# Patient Record
Sex: Male | Born: 1940
Health system: Southern US, Community
[De-identification: ages and names within clinical notes are randomized; demographics above are authoritative.]

## PROBLEM LIST (undated history)

## (undated) DIAGNOSIS — K469 Unspecified abdominal hernia without obstruction or gangrene: Secondary | ICD-10-CM

## (undated) DIAGNOSIS — G4733 Obstructive sleep apnea (adult) (pediatric): Secondary | ICD-10-CM

## (undated) DIAGNOSIS — R39198 Other difficulties with micturition: Secondary | ICD-10-CM

## (undated) DIAGNOSIS — K5792 Diverticulitis of intestine, part unspecified, without perforation or abscess without bleeding: Secondary | ICD-10-CM

## (undated) DIAGNOSIS — M7918 Myalgia, other site: Secondary | ICD-10-CM

## (undated) DIAGNOSIS — I639 Cerebral infarction, unspecified: Secondary | ICD-10-CM

## (undated) DIAGNOSIS — K219 Gastro-esophageal reflux disease without esophagitis: Secondary | ICD-10-CM

## (undated) DIAGNOSIS — E6609 Other obesity due to excess calories: Secondary | ICD-10-CM

## (undated) DIAGNOSIS — R079 Chest pain, unspecified: Secondary | ICD-10-CM

## (undated) DIAGNOSIS — I498 Other specified cardiac arrhythmias: Secondary | ICD-10-CM

## (undated) DIAGNOSIS — G4730 Sleep apnea, unspecified: Secondary | ICD-10-CM

## (undated) DIAGNOSIS — K625 Hemorrhage of anus and rectum: Secondary | ICD-10-CM

## (undated) DIAGNOSIS — I6529 Occlusion and stenosis of unspecified carotid artery: Secondary | ICD-10-CM

## (undated) DIAGNOSIS — R0609 Other forms of dyspnea: Secondary | ICD-10-CM

## (undated) DIAGNOSIS — H269 Unspecified cataract: Secondary | ICD-10-CM

## (undated) DIAGNOSIS — E785 Hyperlipidemia, unspecified: Secondary | ICD-10-CM

## (undated) DIAGNOSIS — J45909 Unspecified asthma, uncomplicated: Secondary | ICD-10-CM

## (undated) DIAGNOSIS — IMO0002 Reserved for concepts with insufficient information to code with codable children: Secondary | ICD-10-CM

## (undated) DIAGNOSIS — N400 Enlarged prostate without lower urinary tract symptoms: Secondary | ICD-10-CM

## (undated) DIAGNOSIS — Z8719 Personal history of other diseases of the digestive system: Secondary | ICD-10-CM

## (undated) DIAGNOSIS — R0600 Dyspnea, unspecified: Secondary | ICD-10-CM

## (undated) DIAGNOSIS — E119 Type 2 diabetes mellitus without complications: Secondary | ICD-10-CM

## (undated) DIAGNOSIS — K5790 Diverticulosis of intestine, part unspecified, without perforation or abscess without bleeding: Secondary | ICD-10-CM

## (undated) DIAGNOSIS — T7840XA Allergy, unspecified, initial encounter: Secondary | ICD-10-CM

## (undated) DIAGNOSIS — M1990 Unspecified osteoarthritis, unspecified site: Secondary | ICD-10-CM

## (undated) DIAGNOSIS — J189 Pneumonia, unspecified organism: Secondary | ICD-10-CM

## (undated) HISTORY — DX: Gastro-esophageal reflux disease without esophagitis: K21.9

## (undated) HISTORY — DX: Dyspnea, unspecified: R06.00

## (undated) HISTORY — PX: COLONOSCOPY: SHX174

## (undated) HISTORY — DX: Hyperlipidemia, unspecified: E78.5

## (undated) HISTORY — PX: UPPER GASTROINTESTINAL ENDOSCOPY: SHX188

## (undated) HISTORY — DX: Unspecified osteoarthritis, unspecified site: M19.90

## (undated) HISTORY — DX: Allergy, unspecified, initial encounter: T78.40XA

## (undated) HISTORY — DX: Reserved for concepts with insufficient information to code with codable children: IMO0002

## (undated) HISTORY — DX: Diverticulitis of intestine, part unspecified, without perforation or abscess without bleeding: K57.92

## (undated) HISTORY — DX: Unspecified asthma, uncomplicated: J45.909

## (undated) HISTORY — DX: Hemorrhage of anus and rectum: K62.5

## (undated) HISTORY — PX: KNEE SURGERY: SHX244

## (undated) HISTORY — PX: ROTATOR CUFF REPAIR: SHX139

## (undated) HISTORY — DX: Other forms of dyspnea: R06.09

## (undated) HISTORY — DX: Other difficulties with micturition: R39.198

## (undated) HISTORY — DX: Occlusion and stenosis of unspecified carotid artery: I65.29

## (undated) HISTORY — PX: KIDNEY STONE SURGERY: SHX686

## (undated) HISTORY — DX: Sleep apnea, unspecified: G47.30

## (undated) HISTORY — PX: TONSILLECTOMY: SHX5217

## (undated) HISTORY — DX: Unspecified cataract: H26.9

## (undated) HISTORY — DX: Type 2 diabetes mellitus without complications: E11.9

## (undated) HISTORY — DX: Obstructive sleep apnea (adult) (pediatric): G47.33

## (undated) HISTORY — PX: ELBOW BURSA SURGERY: SHX615

## (undated) HISTORY — PX: INGUINAL HERNIA REPAIR: SHX194

## (undated) HISTORY — DX: Myalgia, other site: M79.18

## (undated) HISTORY — DX: Benign prostatic hyperplasia without lower urinary tract symptoms: N40.0

## (undated) HISTORY — DX: Diverticulosis of intestine, part unspecified, without perforation or abscess without bleeding: K57.90

## (undated) HISTORY — DX: Other obesity due to excess calories: E66.09

---

## 1989-07-01 DIAGNOSIS — Z86010 Personal history of colonic polyps: Secondary | ICD-10-CM

## 1989-07-01 DIAGNOSIS — Z860101 Personal history of adenomatous and serrated colon polyps: Secondary | ICD-10-CM

## 1989-07-01 HISTORY — DX: Personal history of colonic polyps: Z86.010

## 1989-07-01 HISTORY — DX: Personal history of adenomatous and serrated colon polyps: Z86.0101

## 1997-11-09 ENCOUNTER — Ambulatory Visit: Admission: RE | Admit: 1997-11-09 | Discharge: 1997-11-09 | Payer: Self-pay | Admitting: Family Medicine

## 2000-03-12 ENCOUNTER — Ambulatory Visit (HOSPITAL_COMMUNITY): Admission: RE | Admit: 2000-03-12 | Discharge: 2000-03-12 | Payer: Self-pay | Admitting: Gastroenterology

## 2000-03-12 ENCOUNTER — Encounter (INDEPENDENT_AMBULATORY_CARE_PROVIDER_SITE_OTHER): Payer: Self-pay | Admitting: *Deleted

## 2001-04-12 ENCOUNTER — Encounter: Payer: Self-pay | Admitting: Emergency Medicine

## 2001-04-12 ENCOUNTER — Emergency Department (HOSPITAL_COMMUNITY): Admission: EM | Admit: 2001-04-12 | Discharge: 2001-04-12 | Payer: Self-pay | Admitting: Emergency Medicine

## 2001-12-03 ENCOUNTER — Encounter: Payer: Self-pay | Admitting: Emergency Medicine

## 2001-12-03 ENCOUNTER — Inpatient Hospital Stay (HOSPITAL_COMMUNITY): Admission: EM | Admit: 2001-12-03 | Discharge: 2001-12-04 | Payer: Self-pay | Admitting: Emergency Medicine

## 2001-12-04 ENCOUNTER — Encounter: Payer: Self-pay | Admitting: Cardiovascular Disease

## 2004-12-27 ENCOUNTER — Ambulatory Visit: Payer: Self-pay | Admitting: Cardiology

## 2005-01-03 ENCOUNTER — Ambulatory Visit: Payer: Self-pay

## 2005-04-15 ENCOUNTER — Encounter (INDEPENDENT_AMBULATORY_CARE_PROVIDER_SITE_OTHER): Payer: Self-pay | Admitting: *Deleted

## 2006-02-07 ENCOUNTER — Ambulatory Visit: Payer: Self-pay | Admitting: Internal Medicine

## 2006-03-28 ENCOUNTER — Ambulatory Visit (HOSPITAL_BASED_OUTPATIENT_CLINIC_OR_DEPARTMENT_OTHER): Admission: RE | Admit: 2006-03-28 | Discharge: 2006-03-28 | Payer: Self-pay | Admitting: Internal Medicine

## 2006-04-06 ENCOUNTER — Ambulatory Visit: Payer: Self-pay | Admitting: Internal Medicine

## 2006-04-11 ENCOUNTER — Ambulatory Visit: Payer: Self-pay | Admitting: Internal Medicine

## 2006-05-16 ENCOUNTER — Ambulatory Visit: Payer: Self-pay | Admitting: Internal Medicine

## 2006-07-01 DIAGNOSIS — IMO0002 Reserved for concepts with insufficient information to code with codable children: Secondary | ICD-10-CM

## 2006-07-01 HISTORY — DX: Reserved for concepts with insufficient information to code with codable children: IMO0002

## 2006-09-15 ENCOUNTER — Ambulatory Visit: Payer: Self-pay | Admitting: Internal Medicine

## 2007-09-17 ENCOUNTER — Ambulatory Visit: Payer: Self-pay | Admitting: Cardiology

## 2007-09-22 ENCOUNTER — Ambulatory Visit: Payer: Self-pay

## 2008-09-22 ENCOUNTER — Ambulatory Visit: Payer: Self-pay

## 2009-01-11 ENCOUNTER — Ambulatory Visit (HOSPITAL_COMMUNITY): Admission: EM | Admit: 2009-01-11 | Discharge: 2009-01-11 | Payer: Self-pay | Admitting: Emergency Medicine

## 2009-01-11 DIAGNOSIS — IMO0002 Reserved for concepts with insufficient information to code with codable children: Secondary | ICD-10-CM

## 2009-01-11 HISTORY — DX: Reserved for concepts with insufficient information to code with codable children: IMO0002

## 2009-04-17 ENCOUNTER — Encounter: Payer: Self-pay | Admitting: Internal Medicine

## 2009-04-24 ENCOUNTER — Encounter: Payer: Self-pay | Admitting: Internal Medicine

## 2009-05-16 ENCOUNTER — Encounter: Payer: Self-pay | Admitting: Internal Medicine

## 2009-07-31 ENCOUNTER — Telehealth: Payer: Self-pay | Admitting: Cardiology

## 2009-08-01 DIAGNOSIS — R079 Chest pain, unspecified: Secondary | ICD-10-CM | POA: Insufficient documentation

## 2009-08-01 DIAGNOSIS — I498 Other specified cardiac arrhythmias: Secondary | ICD-10-CM | POA: Insufficient documentation

## 2009-08-01 DIAGNOSIS — E785 Hyperlipidemia, unspecified: Secondary | ICD-10-CM | POA: Insufficient documentation

## 2009-08-01 HISTORY — DX: Other specified cardiac arrhythmias: I49.8

## 2009-08-02 ENCOUNTER — Ambulatory Visit: Payer: Self-pay | Admitting: Cardiology

## 2009-08-02 DIAGNOSIS — E669 Obesity, unspecified: Secondary | ICD-10-CM | POA: Insufficient documentation

## 2009-08-18 ENCOUNTER — Ambulatory Visit: Payer: Self-pay | Admitting: Cardiology

## 2009-09-28 ENCOUNTER — Encounter: Payer: Self-pay | Admitting: Cardiology

## 2009-09-28 DIAGNOSIS — I6529 Occlusion and stenosis of unspecified carotid artery: Secondary | ICD-10-CM | POA: Insufficient documentation

## 2009-10-19 ENCOUNTER — Ambulatory Visit: Payer: Self-pay

## 2009-10-19 ENCOUNTER — Encounter: Payer: Self-pay | Admitting: Cardiology

## 2010-04-23 ENCOUNTER — Encounter (INDEPENDENT_AMBULATORY_CARE_PROVIDER_SITE_OTHER): Payer: Self-pay | Admitting: *Deleted

## 2010-05-02 ENCOUNTER — Telehealth: Payer: Self-pay | Admitting: Internal Medicine

## 2010-05-08 ENCOUNTER — Encounter (INDEPENDENT_AMBULATORY_CARE_PROVIDER_SITE_OTHER): Payer: Self-pay | Admitting: *Deleted

## 2010-05-09 ENCOUNTER — Ambulatory Visit: Payer: Self-pay | Admitting: Internal Medicine

## 2010-05-21 ENCOUNTER — Ambulatory Visit: Payer: Self-pay | Admitting: Internal Medicine

## 2010-07-31 NOTE — Procedures (Signed)
Summary: COLON (Dr Kinnie Scales)  COLON (Dr Kinnie Scales)   Imported By: Lamona Curl CMA (AAMA) 05/09/2010 15:24:47  _____________________________________________________________________  External Attachment:    Type:   Image     Comment:   External Document

## 2010-07-31 NOTE — Letter (Signed)
Summary: Pre Visit Letter Revised  [facility]  [address] [place], [place] [address]   Phone: [phone]  Fax: [phone]        04/23/2010 MRN: [id number] Cristian Moore [address] [place], [place]  [address]             Procedure Date:  05/21/2010   Welcome to the Gastroenterology Division at [facility].    You are scheduled to see a nurse for your pre-procedure visit on 05/09/2010 at 8:30AM on the 3rd floor at [facility], [address]  We ask that you try to arrive at our office 15 minutes prior to your appointment time to allow for check-in.  Please take a minute to review the attached form.  If you answer "Yes" to one or more of the questions on the first page, we ask that you call the person listed at your earliest opportunity.  If you answer "No" to all of the questions, please complete the rest of the form and bring it to your appointment.    Your nurse visit will consist of discussing your medical and surgical history, your immediate family medical history, and your medications.   If you are unable to list all of your medications on the form, please bring the medication bottles to your appointment and we will list them.  We will need to be aware of both prescribed and over the counter drugs.  We will need to know exact dosage information as well.    Please be prepared to read and sign documents such as consent forms, a financial agreement, and acknowledgement forms.  If necessary, and with your consent, a friend or relative is welcome to sit-in on the nurse visit with you.  Please bring your insurance card so that we may make a copy of it.  If your insurance requires a referral to see a specialist, please bring your referral form from your primary care physician.  No co-pay is required for this nurse visit.     If you cannot keep your appointment, please call [phone] to cancel or reschedule prior to your appointment date.  This allows  [place] the opportunity to schedule an appointment for another patient in need of care.    Thank you for choosing  Gastroenterology for your medical needs.  We appreciate the opportunity to care for you.  Please visit [place] at our website  to learn more about our practice.  Sincerely, The Gastroenterology Division

## 2010-07-31 NOTE — Progress Notes (Signed)
Summary: previsit letter ?'s  Phone Note Call from Patient Call back at [facility] Phone [phone]   Summary of Call: pt called and stated he had previous history with Dr. Kinnie Scales and with Dr. Corinda Gubler.  Pt is changing practices due to insurance.  Pt states he received recall letter from Dr. Jennye Boroughs office.  He will come by to sign records release.  Old paper chart requested. Initial call taken by: Francee Piccolo CMA Duncan Dull),  May 02, 2010 2:06 PM  Follow-up for Phone Call        pt signed records release today.  This was faxed to Dr. Jennye Boroughs office.  Pt will keep previsit appt on 11/9.   Follow-up by: Francee Piccolo CMA Duncan Dull),  May 07, 2010 4:07 PM  Additional Follow-up for Phone Call Additional follow up Details #1::        Per Dr. Jennye Boroughs office, they did receive ROI and it has been signed by [facility].  they are short staffed and we will maybe get tomorrow. Francee Piccolo CMA Duncan Dull)  May 09, 2010 9:56 AM  2 colonoscopy reports received from Dr. Clarita Crane show polyps.  Reports scanned into EMR.  Paper chart is on your desk to review [facility] colons.  Colon in 1991 positive for adenomatous polyp.   Pt is scheduled for colon on 11/21--does he need to keep this appt? Additional Follow-up by: Francee Piccolo CMA Duncan Dull),  May 09, 2010 3:21 PM    Additional Follow-up for Phone Call Additional follow up Details #2::    I would not repeat until 7 years (2013) at earliest as had only 1 tiney polyp (adenoma) in [address] and none since. Could probably go 10 years but anywhere from 5-10 is appropriate.  We can do it now if it is his wish but I recommend recall in 2013 and think about it then. Iva Boop MD, [facility]  May 14, 2010 6:43 PM  Pt desires to keep appt for 11/21 as Dr. Kinnie Scales told him he had three polyps on last colon.  I advised pt that his last report did not show any polyps, but we would leave appt for colon on 11/21. Follow-up by: Francee Piccolo CMA Duncan Dull),  May 15, 2010 11:37 AM    Colonoscopy  Procedure date:  04/01/1990  Findings:      diminutive sigmoid polyps (2), 1was adenoma, other hyperplastic  Colonoscopy  Procedure date:  07/24/1991  Findings:      Diverticulosis otherwise ok  Medoff  Colonoscopy  Procedure date:  12/06/1994  Findings:      Sigmoid diverticulosis  Medoff  EGD  Procedure date:  12/06/1994  Findings:      Hiatal Hernia  Colonoscopy  Procedure date:  03/12/2000  Findings:      Sigmoid diverticulosis  Medoff  Colonoscopy  Procedure date:  04/15/2005  Findings:      Sigmoid diverticulosis Internal Hemorrhoids  [facility]off

## 2010-07-31 NOTE — Procedures (Signed)
Summary: Colonoscopy  Patient: [name] Note: All result statuses are Final unless otherwise noted.  Tests: (1) Colonoscopy (COL)   COL Colonoscopy           DONE     [facility]     [address]     [place], [place]  [address]           COLONOSCOPY PROCEDURE REPORT           PATIENT:  Cristian Moore, Cristian Moore  MR#:  [id number]     BIRTHDATE:  02-28-1941, 69 yrs. old  GENDER:  male     ENDOSCOPIST:  Iva Boop, MD, [facility]           PROCEDURE DATE:  05/21/2010     PROCEDURE:  Colonoscopy [address]     ASA CLASS:  Class III     INDICATIONS:  surveillance and high-risk screening, history of     pre-cancerous (adenomatous) colon polyps diminutive adenoma     removed 1991, no more polyps on 4 subsequent colonoscopies, last     in 2006     MEDICATIONS:   Fentanyl 62.5 mcg IV, Versed 8 mg IV           DESCRIPTION OF PROCEDURE:   After the risks benefits and     alternatives of the procedure were thoroughly explained, informed     consent was obtained.  Digital rectal exam was performed and     revealed no abnormalities and normal prostate.   The LB CF-[id number]     [id number] endoscope was introduced through the anus and advanced to     the cecum, which was identified by both the appendix and ileocecal     valve, without limitations.  The quality of the prep was     excellent, using MoviPrep.  The instrument was then slowly     withdrawn as the colon was fully examined. Insertion: 3:10 minutes     Withdrawal: 9:06 minutes     <<PROCEDUREIMAGES>>           FINDINGS:  Severe diverticulosis was found in the sigmoid colon.     This was otherwise a normal examination of the colon, including     right colon retroflexion.   Retroflexed views in the rectum     revealed internal hemorrhoids.  They were very small.  The scope     was then withdrawn from the patient and the procedure completed.           COMPLICATIONS:  None     ENDOSCOPIC IMPRESSION:     1) Severe diverticulosis in the  sigmoid colon     2) Internal hemorrhoids     3) Otherwise normal examination with excellent prep     4) Personal history of diminutive adenoma removal 1991, none     since.           REPEAT EXAM:  In 10 year(s) for routine screening colonoscopy.     Depends upon health status at that time           Iva Boop, MD, [facility]           CC:  Rudi Heap, MD     The Patient           n.     eSIGNED:   Iva Boop at 05/21/2010 03:37 PM           [name], [id number]  Note: An exclamation mark Marland Kitchen)  indicates a result that was not dispersed into the flowsheet. Document Creation Date: 05/21/2010 3:38 PM _______________________________________________________________________  (1) Order result status: Final Collection or observation date-time: 05/21/2010 15:23 Requested date-time:  Receipt date-time:  Reported date-time:  Referring Physician:   Ordering Physician: Stan Head [id number]) Specimen Source:  Source: Launa Grill Order Number: [id number] Lab site:   Appended Document: Colonoscopy    Clinical Lists Changes  Observations: Added new observation of COLONNXTDUE: 05/2020 (05/22/2010 8:26)

## 2010-07-31 NOTE — Miscellaneous (Signed)
Summary: [facility] PREVISIT/PREP  Clinical Lists Changes  Medications: Added new medication of MOVIPREP 100 GM  SOLR (PEG-KCL-NACL-NASULF-NA ASC-C) As per prep instructions. - Signed Rx of MOVIPREP 100 GM  SOLR (PEG-KCL-NACL-NASULF-NA ASC-C) As per prep instructions.;  #1 x 0;  Signed;  Entered by: Wyona Almas RN;  Authorized by: Iva Boop MD, [facility];  Method used: Electronically to [facility] [address], [place], [place], [place]  [address], Ph: [phone], Fax: [phone] Observations: Added new observation of ALLERGY REV: Done (05/09/2010 8:03)    Prescriptions: MOVIPREP 100 GM  SOLR (PEG-KCL-NACL-NASULF-NA ASC-C) As per prep instructions.  #1 x 0   Entered by:   Wyona Almas RN   Authorized by:   Iva Boop MD, [facility]   Signed by:   Wyona Almas RN on 05/09/2010   Method used:   Electronically to        [facility] (retail)       [address]       [place], [place]  [address]       Ph: [phone]       Fax: [phone]   RxID:   [id number]

## 2010-07-31 NOTE — Letter (Signed)
Summary: [facility] Instructions  [facility]  [address] [place], [place] [address]   Phone: [phone]  Fax: [phone]       Cristian Moore    11/07/1940    MRN: [id number]        Procedure Day Dorna Bloom:  Duanne Limerick  05/21/10     Arrival Time:  1:00PM     Procedure Time:  2:00PM     Location of Procedure:                    _ X_  [facility] (4th Floor)                      PREPARATION FOR COLONOSCOPY WITH MOVIPREP   Starting 5 days prior to your procedure 05/16/10 do not eat nuts, seeds, popcorn, corn, beans, peas,  salads, or any raw vegetables.  Do not take any fiber supplements (e.g. Metamucil, Citrucel, and Benefiber).  THE DAY BEFORE YOUR PROCEDURE         DATE: 05/20/10  DAY: SUNDAY  1.  Drink clear liquids the entire day-NO SOLID FOOD  2.  Do not drink anything colored red or purple.  Avoid juices with pulp.  No orange juice.  3.  Drink at least 64 oz. (8 glasses) of fluid/clear liquids during the day to prevent dehydration and help the prep work efficiently.  CLEAR LIQUIDS INCLUDE: Water Jello Ice Popsicles Tea (sugar ok, no milk/cream) Powdered fruit flavored drinks Coffee (sugar ok, no milk/cream) Gatorade Juice: apple, white grape, white cranberry  Lemonade Clear bullion, consomm, broth Carbonated beverages (any kind) Strained chicken noodle soup Hard Candy                             4.  In the morning, mix first dose of MoviPrep solution:    Empty 1 Pouch A and 1 Pouch B into the disposable container    Add lukewarm drinking water to the top line of the container. Mix to dissolve    Refrigerate (mixed solution should be used within 24 hrs)  5.  Begin drinking the prep at 5:00 p.m. The MoviPrep container is divided by 4 marks.   Every 15 minutes drink the solution down to the next mark (approximately 8 oz) until the full liter is complete.   6.  Follow completed prep with 16 oz of clear liquid of your choice (Nothing  red or purple).  Continue to drink clear liquids until bedtime.  7.  Before going to bed, mix second dose of MoviPrep solution:    Empty 1 Pouch A and 1 Pouch B into the disposable container    Add lukewarm drinking water to the top line of the container. Mix to dissolve    Refrigerate  THE DAY OF YOUR PROCEDURE      DATE: 05/21/10   DAY: MONDAY  Beginning at 9:00AM (5 hours before procedure):         1. Every 15 minutes, drink the solution down to the next mark (approx 8 oz) until the full liter is complete.  2. Follow completed prep with 16 oz. of clear liquid of your choice.    3. You may drink clear liquids until 12:00PM (2 HOURS BEFORE PROCEDURE).   MEDICATION INSTRUCTIONS  Unless otherwise instructed, you should take regular prescription medications with a small sip of water   as early as possible the morning of  your procedure.        OTHER INSTRUCTIONS  You will need a responsible adult at least 71 years of age to accompany you and drive you home.   This person must remain in the waiting room during your procedure.  Wear loose fitting clothing that is easily removed.  Leave jewelry and other valuables at home.  However, you may wish to bring a book to read or  an iPod/MP3 player to listen to music as you wait for your procedure to start.  Remove all body piercing jewelry and leave at home.  Total time from sign-in until discharge is approximately 2-3 hours.  You should go home directly after your procedure and rest.  You can resume normal activities the  day after your procedure.  The day of your procedure you should not:   Drive   Make legal decisions   Operate machinery   Drink alcohol   Return to work  You will receive specific instructions about eating, activities and medications before you leave.    The above instructions have been reviewed and explained to me by   Wyona Almas RN  November  71, 2011 9:08 AM     I fully understand and  can verbalize these instructions _____________________________ Date _________

## 2010-07-31 NOTE — Progress Notes (Signed)
Summary: pt was having chest tightness   Phone Note Call from Patient Call back at Home Phone [phone]   Caller: Patient Reason for Call: Talk to Nurse, Talk to Doctor Summary of Call: pt had some tightness in his chest and broke out in a sweat the other night and he is not sure if it's his heart or not but dosen't want to see PA and feels [place] too far away Initial call taken by: Omer Jack,  July 31, 2009 12:11 PM  Follow-up for Phone Call        not having any pain now, had CP on Sunday night was laying on couch and real tight in chest,  thought it  was gas, went to bed woke up later with nausea, and pain in chest through to back then broke into cold sweat.  No pain or problems now.  Appt given for 08/02/2009 at 11:15 am. pt is aware to call 911 if cp or s/s return. Follow-up by: Charolotte Capuchin, RN,  July 31, 2009 2:18 PM

## 2010-07-31 NOTE — Miscellaneous (Signed)
Summary: Orders Update  Clinical Lists Changes  Problems: Added new problem of CAROTID ARTERY DISEASE (ICD-433.10) Orders: Added new Test order of Carotid Duplex (Carotid Duplex) - Signed 

## 2010-07-31 NOTE — Procedures (Signed)
Summary: Colonoscopy (Dr Kinnie Scales)  Colonoscopy (Dr Kinnie Scales)   Imported By: Lamona Curl CMA (AAMA) 05/09/2010 15:24:20  _____________________________________________________________________  External Attachment:    Type:   Image     Comment:   External Document

## 2010-07-31 NOTE — Assessment & Plan Note (Signed)
Summary: chest pain  and 1 yr rov  pfh,rn   Visit Type:  Follow-up Primary Provider:  Dr. Christell Constant  CC:  chest pain.  History of Present Illness: The patient presents for evaluation of chest discomfort. This happened a couple of nights ago. He was sleeping. He felt like it was indigestion or gas. He was nauseated. He got up to use the bathroom and while sitting there felt dizzy. He began vomiting. He had diaphoresis. Following this he was weak the next day but has had no recurrent chest discomfort. He felt that this was different than his previous reflux. Otherwise the patient had been doing well. He has had no recent other cardiovascular symptoms. 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  With this activity he has had no chest pressure, neck or arm discomfort. He has had no palpitations, presyncope or syncope. He has had none of the symptoms it was his previous SVT. He has had no shortness of breath and denies any PND or orthopnea.  Of note the patient has had a recent diagnosis of gastritis and apparent peptic ulcer disease treated at the [facility].  Current Medications (verified): 1)  Zocor 20 Mg Tabs (Simvastatin) .Marland Kitchen.. 1 By Mouth Daily 2)  Flomax 0.4 Mg Caps (Tamsulosin Hcl) .Marland Kitchen.. 1 By Mouth Daily 3)  Metoprolol Tartrate 50 Mg Tabs (Metoprolol Tartrate) .... 1/2 By Mouth Two Times A Day 4)  Aspirin 325 Mg  Tabs (Aspirin) .... 1/2  By Mouth Daily 5)  Omeprazole 20 Mg Cpdr (Omeprazole) .Marland Kitchen.. 1 By Mouth Two Times A Day  Allergies (verified): 1)  ! Penicillin  Past History:  Past Medical History: Obstructive Sleep Apnea Supraventricular tachycardia, Benign prostatic hypertrophy Sleep apnea (he is tolerating CPAP Hiatal hernia/PUD (recent DX at the [place], treated) Chest pain Dyslipidemia,  Diverticulosis.   Past Surgical History: Inguinal hernia repair Kidney stones removed,  Tonsillectomy.      Review of Systems       As  stated in the HPI and negative for all other systems.   Vital Signs:  Patient profile:   70 year old male Height:      72 inches Weight:      270 pounds BMI:     36.75 Pulse rate:   77 / minute Resp:     16 per minute BP sitting:   121 / 81  (right arm)  Vitals Entered By: Marrion Coy, CNA (August 02, 2009 10:59 AM)  Physical Exam  General:  Well developed, well nourished, in no acute distress. Head:  normocephalic and atraumatic Eyes:  PERRLA/EOM intact; conjunctiva and lids normal. Mouth:  Teeth, gums and palate normal. Oral mucosa normal. Neck:  Neck supple, no JVD. No masses, thyromegaly or abnormal cervical nodes. Chest Wall:  no deformities or breast masses noted Lungs:  Clear bilaterally to auscultation and percussion. Abdomen:  Bowel sounds positive; abdomen soft and non-tender without masses, organomegaly, or hernias noted. No hepatosplenomegaly. Msk:  Back normal, normal gait. Muscle strength and tone normal. Extremities:  No clubbing or cyanosis. Neurologic:  Alert and oriented x 3. Skin:  Intact without lesions or rashes. Cervical Nodes:  no significant adenopathy Axillary Nodes:  no significant adenopathy Inguinal Nodes:  no significant adenopathy Psych:  Normal affect.   Detailed Cardiovascular Exam  Neck    Carotids: Carotids full and equal bilaterally without bruits.      Neck Veins: Normal, no JVD.  Heart    Inspection: no deformities or lifts noted.      Palpation: normal PMI with no thrills palpable.      Auscultation: regular rate and rhythm, S1, S2 without murmurs, rubs, gallops, or clicks.    Vascular    Abdominal Aorta: no palpable masses, pulsations, or audible bruits.      Femoral Pulses: normal femoral pulses bilaterally.      Pedal Pulses: normal pedal pulses bilaterally.      Radial Pulses: normal radial pulses bilaterally.      Peripheral Circulation: no clubbing, cyanosis, or edema noted with normal capillary refill.      Impression & Recommendations:  Problem # 1:  CHEST PAIN (ICD-786.50) The patient's chest pain is somewhat atypical. He has had negative stress tests in the past. At this point I think the pretest probability of obstructive coronary disease as the etiology of his complaint is low. I do think testing with an exercise treadmill test is indicated. Orders: EKG w/ Interpretation (93[address]) Treadmill (Treadmill)  Problem # 2:  SUPRAVENTRICULAR TACHYCARDIA (ICD-427.89) He has had no symptomatic recurrence of this. Orders: EKG w/ Interpretation ([address])  Problem # 3:  OBESITY, UNSPECIFIED (ICD-278.00) The patient and I discussed weight loss strategies and in particular I gave him very specific diet suggestions.  Problem # 4:  DYSLIPIDEMIA (ICD-272.4)  Per Dr. Christell Constant.  His updated medication list for this problem includes:    Zocor 20 Mg Tabs (Simvastatin) .Marland Kitchen... 1 by mouth daily  Patient Instructions: 1)  Your physician recommends that you schedule a follow-up appointment at time of stress test 2)  Your physician recommends that you continue on your current medications as directed. Please refer to the Current Medication list given to you today. 3)  Your physician has requested that you have an exercise tolerance test.  For further information please visit [email or web address]  Please also follow instruction sheet, as given.

## 2010-10-07 LAB — DIFFERENTIAL
Eosinophils Relative: 1 % (ref 0–5)
Lymphocytes Relative: 18 % (ref 12–46)
Lymphs Abs: 1.8 10*3/uL (ref 0.7–4.0)
Monocytes Absolute: 0.9 10*3/uL (ref 0.1–1.0)

## 2010-10-07 LAB — CBC
MCHC: 34 g/dL (ref 30.0–36.0)
MCV: 95.8 fL (ref 78.0–100.0)
Platelets: 223 10*3/uL (ref 150–400)
RDW: 13.1 % (ref 11.5–15.5)

## 2010-10-07 LAB — COMPREHENSIVE METABOLIC PANEL
AST: 24 U/L (ref 0–37)
Albumin: 4 g/dL (ref 3.5–5.2)
Calcium: 9.7 mg/dL (ref 8.4–10.5)
Creatinine, Ser: 1.22 mg/dL (ref 0.4–1.5)
GFR calc Af Amer: >60 mL/min (ref >60)
GFR calc non Af Amer: 59 mL/min — ABNORMAL LOW (ref >60)

## 2010-11-13 NOTE — Op Note (Signed)
NAME:  Cristian Moore, Cristian Moore NO.:  [id number]   MEDICAL RECORD NO.:  [id number]          PATIENT TYPE:  OBV   LOCATION:  0098                         FACILITY:  [facility]   PHYSICIAN:  Artist Pais. Weingold, M.D.DATE OF BIRTH:  1941-02-26   DATE OF PROCEDURE:  01/11/2009  DATE OF DISCHARGE:                               OPERATIVE REPORT   PREOPERATIVE DIAGNOSIS:  Deep laceration left index finger dorsally.   POSTOPERATIVE DIAGNOSIS:  Deep laceration left index finger dorsally.   PROCEDURES:  1. I and D.  2. Open treatment of proximal phalangeal fracture.  3. Repair of extensor tendon left index finger.   SURGEON:  Artist Pais. Mina Marble, M.D.   ASSISTANT:  None.   ANESTHESIA:  IV sedation with 0.25% plain Marcaine 10 mL infiltrative  block.   TOURNIQUET TIME:  26 minutes.   COMPLICATIONS:  No complication.   DRAINS:  No drains.   DESCRIPTION OF PROCEDURE:  The patient taken to the operating suite.  After induction of adequate IV sedation, 0.25% Marcaine was injected  into the left index finger dorsally over a transverse laceration.  He  was then prepped and draped in a sterile fashion.  Esmarch was used to  exsanguinate the limb, tourniquet was inflated at 250 mmHg.  At this  point in time, the wound was extended proximally distally in general S  type fashion.  Flaps were raised accordingly and sutured with 409 nylon  retention sutures.  This was thoroughly irrigated and debrided including  a small fracture at the dorsal cortex of the proximal phalanx.  All  nonviable material was debrided as well as some inorganic material.  Once this was done, the extensor tendon was identified proximally and  distally and was sutured with 3 horizontal mattress sutures of 3-0  Ethibond suture to repair the extensor mechanism.  Once this done,  hemostasis was achieved with bipolar cautery and wounds loosely closed  with 4-0 Vicryl Rapide suture.  Xeroform, 4x4s and a volar splint  with  the MCP, TIP and DIP joints extension was applied.  The patient  tolerated these procedures well and went to the recovery room in stable  fashion.     Artist Pais Mina Marble, M.D.  Electronically Signed    MAW/MEDQ  D:  01/11/2009  T:  01/12/2009  Job:  [id number]

## 2010-11-13 NOTE — Consult Note (Signed)
NAME:  Cristian, Moore NO.:  [id number]   MEDICAL RECORD NO.:  [id number]          PATIENT TYPE:  OBV   LOCATION:  0098                         FACILITY:  [facility]   PHYSICIAN:  Artist Pais. Weingold, M.D.DATE OF BIRTH:  1940/09/16   DATE OF CONSULTATION:  01/11/2009  DATE OF DISCHARGE:                                 CONSULTATION   PHYSICIAN REQUESTING CONSULTATION:  Dr. Donnetta Hutching.   REASON FOR CONSULTATION:  Cristian Moore is a 70 year old male, right-  hand dominant, transferred from [facility] with  a laceration dorsal aspect left index finger over the proximal phalanx  with loss of extension.  He is 70 years old __________.  He is on  metoprolol, Zocor and Flomax.  No recent hospitalizations or surgery,  although he had bilateral knee surgeries in the past, as well as a left  elbow surgery in the past.  He does not smoke or drink, otherwise fairly  healthy.  Examination reveals a deep laceration transversely over the  mid shaft of the proximal phalanx down to the bone with a complete  extensor tendon laceration.  X-rays confirmed this.   IMPRESSION:  A 70 year old male with a deep laceration dorsal aspect of  the non-dominant left index finger.  I had a thorough discussion with  the patient and his wife regarding treatment options.  We will go ahead  and taken him to the operating room under IV sedation, local  infiltrative anesthesia and repair the extensor tendon and treat the  open proximal phalangeal fracture.  They understand the risks and  benefits.  We will do this as soon as possible.      Artist Pais Mina Marble, M.D.  Electronically Signed     MAW/MEDQ  D:  01/11/2009  T:  01/12/2009  Job:  [id number]

## 2010-11-13 NOTE — Assessment & Plan Note (Signed)
[facility]                            CARDIOLOGY OFFICE NOTE   Cristian Moore, Cristian Moore                     MRN:          [id number]  DATE:09/17/2007                            DOB:          11-08-1940    PRIMARY CARE PHYSICIAN:  Ernestina Penna, M.D.   REASON FOR PRESENTATION:  A patient with supraventricular tachycardia.   HISTORY OF PRESENT ILLNESS:  The patient is a pleasant 70 year old  gentleman who had supraventricular tachycardia in the past.  He required  adenosine to treat this.  He is considering some drugs for weight loss.  These are Bontril and phentermine.  He is already taking B12.  He wanted  to come and see me before being released to use these.  He has not had  any symptomatic tachycardia since 2003.  He has been doing well without  any chest discomfort, neck or arm discomfort.  He has had no presyncope  or syncope.  He does not get short of breath.  He denies any PND or  orthopnea.  He does rabbit hunting but does not exercise routinely.  He  has been losing weight by avoiding carbohydrates.  He has not started  the drugs as listed.  He is being followed at a weight loss clinic.   PAST MEDICAL HISTORY:  Supraventricular tachycardia, benign prostatic  hypertrophy, sleep apnea (he is tolerating CPAP, which is new since I  saw him in 2006), chest pain (negative stress perfusion study most  recently in 2006), well-preserved ejection fraction, dyslipidemia,  diverticulosis.   PAST SURGICAL HISTORY:  Inguinal hernia repair, kidney stones removed,  tonsillectomy.   ALLERGIES:  PENICILLIN.   MEDICATIONS:  Flomax, metoprolol 25 mg b.i.d., CPAP, Zocor 20 mg daily.   SOCIAL HISTORY:  The patient has never smoked cigarettes.  He is  married.  His has 4 children from his first marriage and 1 stepchild.  He was an Nutritional therapist.   FAMILY HISTORY:  Noncontributory for early coronary artery disease.  His  mother did have valvular heart  disease and strokes and died at age 63.   REVIEW OF SYSTEMS:  As stated in HPI, otherwise negative for other  systems.   PHYSICAL EXAMINATION:  VITAL SIGNS:  The patient is in no distress.  The  blood pressure 118/72, heart rate 66 and regular, weight 259 pounds,  body mass index 36.  HEENT:  Eyes unremarkable.  Pupils equal, round, and reactive to light.  Fundi not visualized.  Oral mucosa unremarkable.  NECK:  No jugular distention at 45 degrees.  Carotid upstrokes brisk and  symmetrical.  No bruits, no thyromegaly.  LYMPHATICS:  No cervical,  axillary, or inguinal adenopathy.  LUNGS:  Clear to auscultation bilaterally.  BACK:  No costovertebral angle tenderness.  CHEST:  Unremarkable.  HEART:  PMI not displaced or sustained.  S1 and S2 within normal limits.  No S3, no S4.  No clicks, rubs, murmurs.  ABDOMEN:  Obese.  Positive bowel sounds.  Normal in frequency and pitch.  No bruits, rebound, guarding or midline pulsatile mass.  No hepatomegaly  or splenomegaly.  SKIN:  No rashes.  No nodules.  EXTREMITIES:  2+ pulses throughout.  No edema, cyanosis or clubbing.  NEURO:  Oriented to person, place, and time.  Cranial nerves II-XII are  grossly intact.  Motor grossly intact.   EKG:  Sinus rhythm, rate 66, axis within normal limits, intervals within  normal limits, no acute ST wave change.   ASSESSMENT/PLAN:  1. Supraventricular tachycardia.  Given this history, I have advised      the patient not to use the stimulant drugs for weight loss.  I      think a better overall strategy are lifestyle changes that include      looking at the number of calories and the types of calories taken      in, as well as increasing calorie output.  I have described for him      an exercise regimen to include walking at least 5 days a week that      should help with this.  He is going to continue with this slow and      steady weight loss plan.  2. Mitral valve prolapse.  He does not have this by  his previous      echoes.  No further cardiovascular testing is suggested.  3. Carotid stenosis.  Patient has a mild carotid plaquing.  He needs      carotid Doppler and this will be arranged for him.  4. Sleep apnea.  He is tolerating his CPAP.  5. Followup.  See the patient again as needed.     [name], MD, [facility]  Electronically Signed    JH/MedQ  DD: 09/17/2007  DT: 09/17/2007  Job #: [id number]   cc:   Bennie Pierini

## 2010-11-16 NOTE — Assessment & Plan Note (Signed)
Cristian Moore                               PULMONARY OFFICE NOTE   Cristian Moore, Cristian Moore                     MRN:          [id number]  DATE:02/07/2006                            DOB:          [birth year]    PROBLEM:  This is a former patient seen years ago at [facility] chest  disease and allergy where a diagnosis of obstructive sleep apnea had been  made based on sleep studies around 1993.  At least one of those may have  been done at the [facility] site.  Records that far back are no longer  available.  He had been fitted with continuous positive airway pressure at  12 centimeters of water pressure and he thinks that his original score was  about 30 episodes per hour.  He never got used to continuous positive airway  pressure and apparently did not follow up.  He pulled it out last try again  about a year ago, but complains that he cannot exhale against the pressure.  Home care company was [employer].  It does not sound as if he worked much with  them for adjustments or feedback.  His wife is concerned and is the  principle reason he is here now.  She has read enough to worry about leaving  this condition untreated.  She describes loud snoring with witnessed apneas.  Sleep onset is rapid and he is a light, somewhat restless, sleeper.  He is  satisfied with his sleep quality, but she is not satisfied with his sleep  quality.  He admits to some mild daytime sleepiness.  Usual bedtime is  around 11 p.m. and up around 7 a.m.  He remembers having an oral appliance,  but it sounds as if that may have been a bruxism guard rather than therapy  for sleep apnea.   REVIEW OF SYMPTOMS:  He thinks that his weight has gone up a few pounds in  recent years.  Nasal congestion when he lies supine.  He thinks that he  tried Breath Right strips for this in the past, but is unclear about what  affect that had.  Occasional Afrin for this.  He denies chest pain,  palpitation, cough or wheeze, significant reflux or other acute changes.  No  headache or syncope.   MEDICATIONS:  1. Flomax 5 mg.  2. Metoprolol 25 mg b.i.d.  3. Zocor 40 mg.   PAST MEDICAL HISTORY:  He works with Dr. Rudi Heap for primary care, Dr.  Larey Dresser for urology and Dr. Sharrell Ku for history of colon  polyps.  He had a tachyarrhythmia treated with cardioversion by description  some years ago without recurrence.  No other cardiac disease.  Elevated  cholesterol.  Thyroiditis was treated in the past remotely with prednisone  and has been followed since without replacement therapy.  Surgeries for  tonsils, hernia repair.  Colonoscopy for polyps.   SOCIAL HISTORY:  He used to smoke a few cigars, but has stopped that  completely.  He is married and works in Systems developer.   FAMILY HISTORY:  Heart  disease.  Does not know of anybody with sleep apnea.   OBJECTIVE:  VITAL SIGNS:  Weight 265 pounds, blood pressure 124/68, pulse  regular 77, room air saturation 94%.  GENERAL:  This is an overweight, alert gentleman.  HEENT:  Vasal airway is not significantly obstructed now.  Palate length is  moderately long, 3/4 with normal tongue base and mandible.  No strider or  thyromegaly.  No neck vein distension or carotid bruits.  CHEST:  Quiet, clear.  LUNG:  Fields shallow, consistent with body habitus without rales or  rhonchi.  HEART:  Sounds are regular without murmur or gallop.  EXTREMITIES:  Without cyanosis or edema.   IMPRESSION:  1. Obstructive sleep apnea now needing reestablishment of status and      initiation of therapy.  2. Exogenous obesity.  3. Nasal congestion, worse supine.   PLAN:  1. We are scheduling a split protocol nocturnal polysomnogram at the [facility] and I will see him back after that to      follow up.  2. Weight loss and driving responsibility were emphasized.  3. Sample trial of Astelin, once each nostril  at bedtime and may retry      Breath Right strips for discussion on return.                                   Clinton D. Maple Hudson, MD, [facility], FACP   CDY/MedQ  DD:  02/08/2006  DT:  02/09/2006  Job #:  [id number]   cc:   Ernestina Penna, MD  Maretta Bees. Vonita Moss, MD  Griffith Citron, MD

## 2010-11-16 NOTE — Assessment & Plan Note (Signed)
[facility]                               PULMONARY OFFICE NOTE   KOEN, ANTILLA                     MRN:          [id number]  DATE:04/11/2006                            DOB:          [birth year]    PROBLEM:  1. Obstructive sleep apnea.  2. Exogenous obesity.  3. Nasal congestion.   HISTORY:  He returns now after repeat sleep study on March 28, 2006  which confirmed moderately severe obstructive apnea with an AHI of 38.9 per  hour.  Snoring was loud with oxygen desaturation to 79%.  He could not  tolerate CPAP or BiPAP despite the technician efforts, was described as  having a panic attack with a sense that he had swallowed some air, leading  to some nausea with vomiting.  Once he settled down, he was comfortable  again but CPAP was no longer tried. He had been intolerant to CPAP with his  initial sleep study previously.  We talked today about surgical options and  oral appliances and reemphasized the importance of weight loss.  He  expressed a strong desire and motivation to lose weight.  I discussed the  availability of CPAP desensitization.  He wants to work with [facility].   MEDICATION:  1. Flomax.  2. Metoprolol 25 mg b.i.d.  3. Zocor 40 mg.   Drug intolerant to PENICILLIN.   OBJECTIVE:  Weight 269 pounds.  Blood pressure 112/68. Pulse regular 69.  Room air saturation 94%.  He is obese, alert and calm.  There is no strider, voice quality is normal. Neck veins are not distended.  He seems to be breathing through his nose comfortably.   IMPRESSION:  Obstructive sleep apnea. Panic attack and what sounds like  aerophagia and nausea with vomiting on attempt to wear CPAP.   PLAN:  1. I agree with emphasis on weight loss.  2. He will work with [facility] on CPAP desensitization starting      with auto titration.  3. Schedule return in 1 month, earlier as needed.       Clinton D. Maple Hudson, MD, [facility],  FACP      CDY/MedQ  DD:  04/12/2006  DT:  04/14/2006  Job #:  [id number]   cc:   Ernestina Penna, M.D.  Maretta Bees. Vonita Moss, M.D.  Griffith Citron, M.D.

## 2010-11-16 NOTE — Procedures (Signed)
[facility]  Patient:    Cristian Moore, Cristian Moore                     MRN: [id number] Proc. Date: 03/12/00 Adm. Date:  [id number] Disc. Date: [id number] Attending:  Deneen Harts CC:         Delaney Meigs, M.D.   Procedure Report  PROCEDURE:  Colonoscopy.  INDICATION FOR PROCEDURE:  A 70 year old white male with a prior history of colon polyps. Last examined 5 years ago. No interim difficulty. Bowel habits are regular. Denies hematochezia, change in stool caliber or altered bowel habits.  DESCRIPTION OF PROCEDURE:  After reviewing the nature of the procedure with the patient including potential risks and complications, and after discussing alternative methods of diagnosis and treatment, informed consent was signed.  The patient was premedicated receiving Versed 9.5 mg, fentanyl 100 mcg administered IV in divided doses prior to and during the course of the procedure.  Using an Olympus pediatric PCF-140L video colonoscope, the rectum was intubated after a digital examination revealed no evidence of perianal or intrarectal pathology.  The scope was advanced through the entire colon without difficulty. Excellent preparation throughout. The cecum was identified by the appendiceal orifice and the ileocecal valve.  The scope was slowly withdrawn with careful inspection of the entire colon in a retrograde manner including retroflexed view in the rectal vault. No abnormality was noted other than sigmoid diverticulosis. There was no evidence of neoplasia, mucosal inflammation or vascular lesion.  The patient tolerated the procedure without difficulty being maintained on DataScope monitor and low flow oxygen throughout.  Returned to recovery in stable condition.  Time 1, technical 1, preparation 1, total score equal 3.  ASSESSMENT: 1. Sigmoid diverticulosis. 2. No evidence of recurrent colorectal neoplasia.  RECOMMENDATIONS: 1. Annual Hemoccult. 2.  Repeat colonoscopy in 5 years. DD:  03/12/00 TD:  03/14/00 Job: [id number]

## 2010-11-16 NOTE — Procedures (Signed)
NAME:  Cristian Moore, Cristian Moore NO.:  [id number]   MEDICAL RECORD NO.:  [id number]          PATIENT TYPE:  OUT   LOCATION:  SLEEP CENTER                 FACILITY:  [facility]   PHYSICIAN:  Clinton D. Maple Hudson, MD, FCCP, FACPDATE OF BIRTH:  03/16[birth year]1940   DATE OF STUDY:  03/28/2006                              NOCTURNAL POLYSOMNOGRAM   REFERRING PHYSICIAN:  Dr. Joni Fears D. Young   INDICATION FOR STUDY:  Hypersomnia with sleep apnea.   EPWORTH SLEEPINESS SCORE:  6/24, BMI 35, weight 260 pounds.   MEDICATIONS:  Listed and reviewed.   SLEEP ARCHITECTURE:  Short total sleep time 246 minutes with sleep  efficiency 55%.  Stage I was 5%, stage II 58%, stages III and IV 20%.  REM  17% of total sleep time.  Sleep latency 58 minutes, REM latency 82 minutes,  awake after sleep onset 142 minutes, arousal index 37.7.  No bedtime  medication was reported.   RESPIRATORY DATA:  Apnea/hypopnea index (AHI, RDI) 38.9 obstructive events  per hour indicating moderately severe obstructive sleep apnea/hypotonia  syndrome.  Events were not positional.  There were 78 obstructive apneas and  82 hypopneas.  REM AHI 76.6 per hour.  CPAP/BiPAP titration was attempted  using a medium Mirage Quattro mask with heated humidifier.  Sequential  titration could  not be done because of the patient's intolerance.  He did  not tolerate CPAP.  The technician tried BiPAP but reportedly had to  discontinue this due to a panic attack, stomach upset, and vomiting.  After  about an hour, he settled down and resumed the study as a diagnostic NPSG  for the rest of the night.   OXYGEN DATA:  Loud snoring with oxygen desaturation to a nadir of 79%.  Mean  oxygen saturation through the study was 91% on room air.   CARDIAC DATA:  Normal sinus rhythm with occasional PVC.   MOVEMENT/PARASOMNIA:  Occasional limb jerk with little effect on sleep.   IMPRESSION/RECOMMENDATIONS:  1. Moderately severe obstructive sleep apnea/hypopnea  syndrome, AHI 38.9      per hour with nonpositional events.  Loud snoring at oxygen      desaturation to 79%.  2. He was unable to tolerate CPAP or BiPAP and was described as having      panic attack and nausea with vomiting.  Consider alternative therapies.      Clinton D. Maple Hudson, MD, [facility], FACP  Diplomate, Biomedical engineer of Sleep Medicine  Electronically Signed     CDY/MEDQ  D:  04/05/2006 11:59:45  T:  04/07/2006 02:49:18  Job:  [id number]

## 2010-11-16 NOTE — Discharge Summary (Signed)
[facility]  Patient:    KARO, ROG Visit Number: [id number] MRN: [id number]          Service Type: MED Location: [phone] Attending Physician:  [name] Dictated by:   Guy Franco, P.A. [facility] Admit Date:  12/03/2001 Discharge Date: 12/04/2001   CC:         Monica Becton, M.D.   Referring Physician Discharge [facility]  DATE OF BIRTH:  [birth year]  DISCHARGE DIAGNOSES: 1. Supraventricular tachycardia, resolved. 2. Chest pain, resolved. 3. Hypercholesterolemia, treated. 4. Sleep apnea, continuous positive airway pressure. 5. History of mitral valve prolapse. 6. Right inguinal hernia. 7. Diverticulosis. 8. Benign prostatic hypertrophy.  HISTORY OF PRESENT ILLNESS:  The patient is a 70 year old male patient of Monica Becton, M.D., who began feeling weakness on the evening prior to his admission.  He had some mild neck discomfort that was described as sharp and nagging.  On the day of admission, he awoke with a continued weak feeling and saw Montey Hora, M.D.  He was noted to have an SVT narrow complex with a rate of 150.  EMS was summoned.  The patient was given adenosine 6 mg IV followed by a 12 mg dose and he continued to his normal sinus rhythm.  Once he converted, he had no further chest discomfort.  He was transported to [facility] for further work-up.  HOSPITAL COURSE:  As far as his work-up, he underwent laboratory studies which showed a white count of 5.5, platelets 207, hemoglobin 15.6, hematocrit 45.4, sodium 144, potassium 4.2, BUN 17, and creatinine 1.1.  CK-MBs were negative. His troponin was mildly elevated at 0.07 (this was felt to be secondary to the SVT).  Total cholesterol 163, triglycerides 128, HDL 45, LDL 93.  TSH 1.146.  The following day, the patient underwent a 2-D echocardiogram which revealed an EF of 50-65% with hypokinesis of the septal wall and mild aortic  valve calcification.  The patient was the discharged to home with plans for a stress Cardiolite at the office on the day of discharge around 12 noon.  DISCHARGE MEDICATIONS:  His medications upon discharge include his medications prior to admission: 1. Zocor 10 mg p.o. q.d. 2. Aspirin q.d. 3. Hytrin. 4. Prilosec. 5. Bextra p.r.n. 6. Folic acid. 7. Vitamin E. 8. CPAP q.h.s. 9. Sublingual nitroglycerin p.r.n. chest pain. Dictated by:   Guy Franco, P.A. [facility] Attending Physician:  [name] DD:  12/09/01 TD:  12/09/01 Job: [id number]

## 2010-11-16 NOTE — Assessment & Plan Note (Signed)
Cristian Moore                             PULMONARY OFFICE NOTE   Cristian Moore, Cristian Moore                     MRN:          [id number]  DATE:09/15/2006                            DOB:          October 17, 1940    PROBLEM LIST:  1. Obstructive sleep apnea.  2. Exogenous obesity.  3. Nasal congestion.   HISTORY:  [facility] and he says this works fine.  He is comfortable with his CPAP, using an auto-titrating machine but  says his mask leaks.  I talked with him about getting mask refit through  his home care company.  He slept well last night.  No spring pollen  problems so far.  He uses Afrin very occasionally and we discussed this.  He makes his own saline nasal rinse.   MEDICATIONS:  1. Flomax  2. Metoprolol 25 mg b.i.d.  3. Zocor 40 mg  4. CPAP auto-titration  5. Ambien 10 mg p.r.n.   DRUG INTOLERANCE:  PENICILLIN.   OBJECTIVE:  Weight 269 pounds.  Blood pressure 124/74.  Pulse regular  67.  Room air saturation 96 percent.  He is overweight.  There is mild  turbinate edema.  Nose and throat are clear.  Chest is quiet without  wheeze or cough.  Heart sounds are regular without murmur.   IMPRESSION:  Obstructive sleep apnea, doing fairly well especially if  his mask fit can be corrected.  He really needs to lose weight and this  was discussed again.   PLAN:  He will continue with auto-titration PAP machine and p.r.n. use  of Ambien.  We have discussed weight loss, driving safety, and sleep  hygiene again.  Schedule return one year but earlier p.r.n.     Clinton D. Maple Hudson, MD, [facility], FACP  Electronically Signed    CDY/MedQ  DD: 09/20/2006  DT: 09/20/2006  Job #: [id number]   cc:   Ernestina Penna, M.D.

## 2010-11-16 NOTE — H&P (Signed)
[facility]  Patient:    Cristian Moore, Cristian Moore Visit Number: [id number] MRN: [id number]          Service Type: MED Location: 3W [phone] Attending Physician:  [name] Dictated by:   [name], M.D. [facility] Admit Date:  12/03/2001 Discharge Date: 12/04/2001   CC:         Monica Becton, M.D.   History and Physical  DATE OF BIRTH: 23-Apr-1941  PRIMARY PHYSICIAN: Monica Becton, M.D.  REASON FOR PRESENTATION: Evaluate patient with supraventricular tachycardia.  HISTORY OF PRESENT ILLNESS: The patient is a 70 year old gentleman, whose past cardiac history includes arm pain approximately 12 years ago.  At that time he had a catheterization and was told he had normal coronaries.  He has since had a negative stress test (this sounds like a plain treadmill) in our office approximately four years ago.  He does have a history of palpitations when he was being treated for thyroiditis in the past.  Otherwise, he has been well. He denies any cardiac symptoms with activity, though he is not particularly active.  He has no activity-induced chest discomfort, neck discomfort, arm discomfort, nausea, vomiting, or excessive diaphoresis.  He has no shortness of breath, PND, or orthopnea.  Last night he felt weak.  He had some mild discomfort in his neck that was a sharp pain.  This was somewhat nagging.  He had no associated chest pain or arm pain.  He went to bed.  He awoke this morning and was so weak that he could barely shave.  He felt that his heart rate was elevated by taking his pulse and so went to Dr. Buel Ream office. There he was seen by Dr. Dimple Casey and noted to have an SVT narrow complex, rate of approximately 150.  He was given adenosine by the EMS crew and broke to a sinus rhythm, and instantly felt better.  PAST MEDICAL HISTORY:  1. Mitral valve prolapse.  2. Sleep apnea.  3. Diverticulitis.  4. History of thyroiditis.  5.  Hyperlipidemia.  He has no history of hypertension or diabetes.  PAST SURGICAL HISTORY: Inguinal hernia repair.  ALLERGIES: PENICILLIN.  MEDICATIONS:  1. Zocor 10 mg q.d.  2. Arthroplasty.  3. Terazosin.  4. Prilosec.  5. Bextra.  6. Vitamin E.  7. Folic acid.  8. CPAP (he wears this sporadically).  SOCIAL HISTORY: [place]  He has four children.  He does not smoke cigarettes.  He will occasionally drink a beer. He drinks two to three cups of coffee and rarely tea.  FAMILY HISTORY: Noncontributory for early coronary artery disease.  REVIEW OF SYSTEMS: Positive for hemorrhoids and bright red blood related to this.  Otherwise, negative for all other systems.  PHYSICAL EXAMINATION:  GENERAL: The patient is in no distress.  VITAL SIGNS: Blood pressure 119/88, heart rate sinus in 80s, afebrile.  HEENT: Eyelids unremarkable.  PERRL.  Fundi not visualized.  Oral mucosa unremarkable.  NECK: No jugular venous distention.  Wave form within normal limits.  Carotid upstrokes brisk and symmetric.  No bruits, no thyromegaly.  LYMPHATICS: No cervical, axillary, or inguinal adenopathy.  LUNGS: Clear to auscultation bilaterally.  BACK: No costovertebral angle tenderness.  CHEST: Unremarkable.  HEART: PMI not displaced or sustained.  S1 and S2 within normal limits.  No S3, no S4, no murmurs.  ABDOMEN: Flat.  Positive bowel sounds, normal in frequency and pitch.  No bruits, rebound, guarding, midline  pulsatile mass, no organomegaly.  SKIN: No rash, no nodules.  EXTREMITIES: Pulses 2+ throughout.  No edema.  NEUROLOGIC: Oriented to person, place, and time.  Cranial nerves II-XII grossly intact.  Motor grossly intact.  LABORATORY DATA: Chest x-ray, question right basilar infiltrate.  EKG, normal sinus rhythm, rate 90, axis within normal limits, intervals within normal limits; no acute ST-T wave changes.  Hemoglobin 15.6, WBC 5.5.   Sodium 144, potassium 4.2, BUN 17, creatinine 1.1. CK 140, MB 3.3, troponin 0.03.  ASSESSMENT/PLAN:  1. Supraventricular tachycardia.  The patient had a narrow complex     tachycardia broke with adenosine.  Our first thought would be to manage     this with an atrioventricular nodal blocking agent.  I will discuss the     possibility of electrophysiology ablation, although I am not suggesting     this at this point as a primary therapy with the first event.  I have     discussed this at great length with the patient and his wife.  2. Chest discomfort.  The patient really did not have significant chest     discomfort and had some mild neck discomfort.  He had no diagnostic     electrocardiogram changes.  He will get enzymes cycled.  However, I would     not suggest cardiac catheterization if those enzymes are normal.  I would     pursue a stress perfusion study.  I discussed this at length with the     patient and his wife as well, and they agreed and understand the risks and     benefits of this strategy.  3. Hyperlipidemia.  He will continue with Zocor and have this followed by Dr.     Christell Constant.  4. Thyroiditis.  He will get a TSH. Dictated by:   [name], M.D. [facility] Attending Physician:  [name] DD:  12/03/01 TD:  12/05/01 Job: [id number]

## 2010-11-16 NOTE — Assessment & Plan Note (Signed)
[facility]                               PULMONARY OFFICE NOTE   MELECIO, CUETO                     MRN:          [id number]  DATE:05/16/2006                            DOB:          Feb 18, 1941    PULMONARY/SLEEP FOLLOW UP:   PROBLEM LIST:  1. Obstructive sleep apnea.  2. Exogenous obesity.  3. Nasal congestion.   HISTORY:  He is doing better with CPAP using an auto-titration machine and a  full face mask. On some nights he just cannot get to sleep or cannot  maintain sleep. He does not recognize the pattern. He tried over-the-counter  sleep medicines. Pressure is ranging around 9-10 CWP. Home care is [facility].   MEDICATIONS:  Flomax 5 mg, Metoprolol 25 mg b.i.d., Zocor 40 mg, CPAP.   DRUG INTOLERANCE:  PENICILLIN.   OBJECTIVE:  VITAL SIGNS: Weight 272 pounds. Blood pressure 118/82, pulse  regular, 66. Room air saturation 97%.  GENERAL: Obese and alert. No pressure marks on his face. No nasal  congestion.  LUNGS: Quiet, unlabored breathing. Pulse is regular.   IMPRESSION:  Obstructive sleep apnea somewhat better controlled on  toleration but with an insomnia component which is nonspecific currently.   PLAN:  1. He is going to continue his current regimen through [facility].  2. Ambien 10 mg 1/2-1 q.h.s. p.r.n. for occasional use with Ambien      discussion done. He is also for comparison given samples of Lunesta 3      mg.   FOLLOW UP:  Scheduled to return in 4 months, earlier p.r.n.     Clinton D. Maple Hudson, MD, [facility], FACP  Electronically Signed    CDY/MedQ  DD: 05/17/2006  DT: 05/17/2006  Job #: [id number]   cc:   Ernestina Penna, M.D.

## 2010-12-05 ENCOUNTER — Other Ambulatory Visit: Payer: Self-pay | Admitting: Internal Medicine

## 2010-12-05 DIAGNOSIS — Z9989 Dependence on other enabling machines and devices: Secondary | ICD-10-CM

## 2010-12-05 DIAGNOSIS — G4733 Obstructive sleep apnea (adult) (pediatric): Secondary | ICD-10-CM

## 2010-12-05 DIAGNOSIS — G4710 Hypersomnia, unspecified: Secondary | ICD-10-CM

## 2010-12-26 ENCOUNTER — Other Ambulatory Visit: Payer: Self-pay | Admitting: Cardiology

## 2011-01-01 ENCOUNTER — Emergency Department (HOSPITAL_COMMUNITY)
Admission: EM | Admit: 2011-01-01 | Discharge: 2011-01-01 | Disposition: A | Discharge status: Home / Self Care | Payer: Medicare Other | Attending: Emergency Medicine | Admitting: Emergency Medicine

## 2011-01-01 ENCOUNTER — Emergency Department (HOSPITAL_COMMUNITY): Payer: Medicare Other

## 2011-01-01 DIAGNOSIS — R000 Tachycardia, unspecified: Secondary | ICD-10-CM | POA: Insufficient documentation

## 2011-01-01 DIAGNOSIS — I10 Essential (primary) hypertension: Secondary | ICD-10-CM | POA: Insufficient documentation

## 2011-01-01 DIAGNOSIS — R0602 Shortness of breath: Secondary | ICD-10-CM | POA: Insufficient documentation

## 2011-01-01 DIAGNOSIS — E7800 Pure hypercholesterolemia, unspecified: Secondary | ICD-10-CM | POA: Insufficient documentation

## 2011-01-01 DIAGNOSIS — R002 Palpitations: Secondary | ICD-10-CM | POA: Insufficient documentation

## 2011-01-01 DIAGNOSIS — R748 Abnormal levels of other serum enzymes: Secondary | ICD-10-CM | POA: Insufficient documentation

## 2011-01-01 LAB — CBC
HCT: 45.6 % (ref 39.0–52.0)
Hemoglobin: 16.1 g/dL (ref 13.0–17.0)
RBC: 4.92 MIL/uL (ref 4.22–5.81)
RDW: 13 % (ref 11.5–15.5)
WBC: 8.8 10*3/uL (ref 4.0–10.5)

## 2011-01-01 LAB — POCT I-STAT, CHEM 8
Chloride: 106 mEq/L (ref 96–112)
HCT: 48 % (ref 39.0–52.0)
Hemoglobin: 16.3 g/dL (ref 13.0–17.0)
Potassium: 4 mEq/L (ref 3.5–5.1)
Sodium: 139 mEq/L (ref 135–145)

## 2011-01-01 LAB — CK TOTAL AND CKMB (NOT AT ARMC)
CK, MB: 6.6 ng/mL (ref 0.3–4.0)
Relative Index: 1.3 (ref 0.0–2.5)

## 2011-01-01 LAB — DIFFERENTIAL
Basophils Absolute: 0.1 10*3/uL (ref 0.0–0.1)
Eosinophils Relative: 1 % (ref 0–5)
Lymphocytes Relative: 23 % (ref 12–46)
Neutro Abs: 5.5 10*3/uL (ref 1.7–7.7)
Neutrophils Relative %: 62 % (ref 43–77)

## 2011-01-01 LAB — TROPONIN I: Troponin I: <0.3 ng/mL (ref <0.30)

## 2011-04-08 ENCOUNTER — Telehealth: Payer: Self-pay | Admitting: *Deleted

## 2011-04-08 NOTE — Telephone Encounter (Signed)
Per telephone call from Dr Christell Constant.  Pt is to be having shoulder surgery by Dr Devonne Doughty and he (Dr Christell Constant) would like to know if Dr Antoine Poche needs to see him for surgical clearance.  Pt has has no chest pain since he was last evaluated.  Per Dr Christell Constant the pt is OK.  Will forward to Dr Antoine Poche for review.  Will call Dr Christell Constant back at [phone] with result.

## 2011-04-10 NOTE — Telephone Encounter (Signed)
Dr Christell Constant aware.

## 2011-04-10 NOTE — Telephone Encounter (Signed)
If Dr. Christell Constant has seen him recently and he has no new symptoms I would not have any reason to see him or think that he needs further work up.

## 2011-05-03 ENCOUNTER — Telehealth: Payer: Self-pay | Admitting: Physician Assistant

## 2011-05-03 ENCOUNTER — Emergency Department (HOSPITAL_COMMUNITY): Payer: Medicare Other

## 2011-05-03 ENCOUNTER — Inpatient Hospital Stay (HOSPITAL_COMMUNITY)
Admission: EM | Admit: 2011-05-03 | Discharge: 2011-05-04 | DRG: 313 | Disposition: A | Discharge status: Home / Self Care | Payer: Medicare Other | Attending: Cardiology | Admitting: Cardiology

## 2011-05-03 DIAGNOSIS — G4733 Obstructive sleep apnea (adult) (pediatric): Secondary | ICD-10-CM | POA: Diagnosis present

## 2011-05-03 DIAGNOSIS — N400 Enlarged prostate without lower urinary tract symptoms: Secondary | ICD-10-CM | POA: Diagnosis present

## 2011-05-03 DIAGNOSIS — R0789 Other chest pain: Secondary | ICD-10-CM

## 2011-05-03 DIAGNOSIS — E785 Hyperlipidemia, unspecified: Secondary | ICD-10-CM | POA: Diagnosis present

## 2011-05-03 DIAGNOSIS — I6529 Occlusion and stenosis of unspecified carotid artery: Secondary | ICD-10-CM | POA: Diagnosis present

## 2011-05-03 DIAGNOSIS — I498 Other specified cardiac arrhythmias: Secondary | ICD-10-CM | POA: Diagnosis present

## 2011-05-03 DIAGNOSIS — Z79899 Other long term (current) drug therapy: Secondary | ICD-10-CM

## 2011-05-03 LAB — POCT I-STAT TROPONIN I

## 2011-05-03 LAB — CK TOTAL AND CKMB (NOT AT ARMC)
CK, MB: 4.5 ng/mL — ABNORMAL HIGH (ref 0.3–4.0)
Total CK: 188 U/L (ref 7–232)

## 2011-05-03 LAB — BASIC METABOLIC PANEL
Calcium: 9.3 mg/dL (ref 8.4–10.5)
GFR calc Af Amer: 80 mL/min — ABNORMAL LOW (ref >90)
GFR calc non Af Amer: 69 mL/min — ABNORMAL LOW (ref >90)
Sodium: 139 mEq/L (ref 135–145)

## 2011-05-03 LAB — DIFFERENTIAL
Basophils Absolute: 0.1 10*3/uL (ref 0.0–0.1)
Eosinophils Absolute: 0.2 10*3/uL (ref 0.0–0.7)
Eosinophils Relative: 2 % (ref 0–5)
Lymphocytes Relative: 19 % (ref 12–46)

## 2011-05-03 LAB — CBC
HCT: 40.7 % (ref 39.0–52.0)
Platelets: 213 10*3/uL (ref 150–400)
RDW: 13.2 % (ref 11.5–15.5)
WBC: 8.4 10*3/uL (ref 4.0–10.5)

## 2011-05-03 NOTE — Telephone Encounter (Signed)
Pt had gone to [facility] to pick up Rx, not there. U/A at first to tell where it went, so called it in to [facility].

## 2011-05-04 LAB — LIPID PANEL
HDL: 42 mg/dL (ref >39)
Total CHOL/HDL Ratio: 3.3 RATIO
VLDL: 19 mg/dL (ref 0–40)

## 2011-05-04 LAB — CBC
HCT: 39.3 % (ref 39.0–52.0)
Hemoglobin: 13.2 g/dL (ref 13.0–17.0)
MCV: 93.8 fL (ref 78.0–100.0)
Platelets: 199 10*3/uL (ref 150–400)
RBC: 4.19 MIL/uL — ABNORMAL LOW (ref 4.22–5.81)
WBC: 8 10*3/uL (ref 4.0–10.5)

## 2011-05-04 LAB — HEPARIN LEVEL (UNFRACTIONATED)
Heparin Unfractionated: 0.33 IU/mL (ref 0.30–0.70)
Heparin Unfractionated: 0.29 IU/mL — ABNORMAL LOW (ref 0.30–0.70)

## 2011-05-04 LAB — CARDIAC PANEL(CRET KIN+CKTOT+MB+TROPI): Relative Index: 2.5 (ref 0.0–2.5)

## 2011-05-04 LAB — TSH: TSH: 0.688 u[IU]/mL (ref 0.350–4.500)

## 2011-05-04 MED ORDER — FOLIC ACID 1 MG PO TABS
1.0000 mg | ORAL_TABLET | Freq: Every day | ORAL | Status: DC
  Filled 2011-05-04: qty 1

## 2011-05-04 MED ORDER — NITROGLYCERIN 0.4 MG SL SUBL: 0.4000 mg | SUBLINGUAL_TABLET | SUBLINGUAL | Status: DC | PRN

## 2011-05-04 MED ORDER — HEPARIN (PORCINE) IN NACL 100-0.45 UNIT/ML-% IJ SOLN: 1450.0000 [IU]/h | INTRAMUSCULAR | Status: DC

## 2011-05-04 MED ORDER — SIMVASTATIN 20 MG PO TABS
20.0000 mg | ORAL_TABLET | Freq: Every day | ORAL | Status: DC
  Filled 2011-05-04: qty 1

## 2011-05-04 MED ORDER — ALPRAZOLAM 0.25 MG PO TABS: 0.2500 mg | ORAL_TABLET | Freq: Two times a day (BID) | ORAL | Status: DC | PRN

## 2011-05-04 MED ORDER — ASPIRIN EC 325 MG PO TBEC: 325.0000 mg | DELAYED_RELEASE_TABLET | Freq: Every day | ORAL | Status: DC

## 2011-05-04 MED ORDER — SUCRALFATE 1 G PO TABS
1.0000 g | ORAL_TABLET | Freq: Three times a day (TID) | ORAL | Status: DC
  Filled 2011-05-04 (×5): qty 1

## 2011-05-04 MED ORDER — ZOLPIDEM TARTRATE 5 MG PO TABS: 5.0000 mg | ORAL_TABLET | Freq: Every evening | ORAL | Status: DC | PRN

## 2011-05-04 MED ORDER — ACETAMINOPHEN 325 MG PO TABS: 650.0000 mg | ORAL_TABLET | ORAL | Status: DC | PRN

## 2011-05-04 MED ORDER — METOPROLOL TARTRATE 25 MG PO TABS
25.0000 mg | ORAL_TABLET | Freq: Two times a day (BID) | ORAL | Status: DC
  Filled 2011-05-04 (×2): qty 1

## 2011-05-04 MED ORDER — PANTOPRAZOLE SODIUM 40 MG PO TBEC: 40.0000 mg | DELAYED_RELEASE_TABLET | Freq: Two times a day (BID) | ORAL | Status: DC

## 2011-05-04 MED ORDER — TAMSULOSIN HCL 0.4 MG PO CAPS
0.4000 mg | ORAL_CAPSULE | Freq: Every day | ORAL | Status: DC
  Filled 2011-05-04: qty 1

## 2011-05-04 MED ORDER — SODIUM CHLORIDE 0.9 % IJ SOLN: 3.0000 mL | Freq: Two times a day (BID) | INTRAMUSCULAR | Status: DC

## 2011-05-04 NOTE — H&P (Signed)
NAME:  Cristian, Moore NO.:  [id number]  MEDICAL RECORD NO.:  [id number]  LOCATION:  3705                         FACILITY:  [facility]  PHYSICIAN:  Luis Abed, MD, FACCDATE OF BIRTH:  July 09, 1940  DATE OF ADMISSION:  05/03/2011 DATE OF DISCHARGE:                             HISTORY & PHYSICAL   [name] is being admitted for chest discomfort.  He is followed by Dr. Antoine Poche for supraventricular tachycardia in the past.  He has had some chest pain in the past.  By history, there was a nuclear scan showing no ischemia.  He also had a standard treadmill in February 2012 showing no marked abnormality.  He has obstructive sleep apnea.  He has hyperlipidemia.  There is no proven coronary artery disease.  The patient had shoulder discomfort that began last night under his left scapula.  He was able to sleep.  He actually had some pain in his right shoulder which has been giving him problems and he actually needs surgery for.  But he awoke this morning feeling this discomfort under his left scapula again.  This persisted and he went to his primary care physician.  There, he received a nitroglycerin.  He became hypotensive, but his discomfort felt much better.  He received fluids and more fluids in the emergency room here.  I then gave him a second nitroglycerin. His blood pressure was stable.  He has slight mild residual discomfort.  PAST MEDICAL HISTORY:  ALLERGIES:  PENICILLIN.  MEDICATIONS:  The med rec will complete the exact dosing.  He is on: 1. Flomax. 2. Metoprolol 25 b.i.d. 3. Zocor 20. 4. Omeprazole 20 b.i.d. 5. Carafate.  OTHER MEDICAL PROBLEMS:  See the complete list below.  SOCIAL HISTORY:  The patient lives with his wife.  He does not smoke.  FAMILY HISTORY:  There is a family history of coronary artery disease.  REVIEW OF SYSTEMS:  The patient denies fever, chills, headache, sweats, rash, change in vision, change in hearing, cough, nausea,  vomiting, urinary symptoms.  All other systems are reviewed and are negative.  PHYSICAL EXAMINATION:  GENERAL:  The patient is overweight.  He is stable at this time.  His wife is in the room. VITAL SIGNS:  Blood pressure is currently 105/60 with a pulse of 58. Room air O2 sat is 98%.  His temperature is 97.9. HEENT:  Head is atraumatic. NECK:  There is no jugular venous distention. LUNGS:  Clear.  Respiratory effort is not labored. CARDIAC:  S1 with an S2.  There are no clicks or significant murmurs. ABDOMEN:  Soft.  There is no peripheral edema. MUSCULOSKELETAL:  There are no musculoskeletal deformities. SKIN:  There are no skin rashes.  EKG reveals no diagnostic changes.  His screening troponin is normal.  Pro-BNP is 11.  Hemoglobin is 13.7. BUN is 18 with a creatinine 1.05.  Potassium 4.2.  Glucose 98.  Chest x-ray reveals no evidence of acute cardiopulmonary problem.  PROBLEMS: 1. History of supraventricular tachycardia in the past. 2. Obstructive sleep apnea with continuous positive airway pressure. 3. Hyperlipidemia. 4. Musculoskeletal pain in the right shoulder from his shoulder     disease that eventually need  surgery. 5. ?Chest discomfort.  Currently, this is the discomfort that brings     him to the hospital.  It is under his left scapula and radiates     towards the anterior chest.  It is very vague at this time.  It was     worse before and seemed to respond to nitroglycerin, but we are not     sure.  I feel he should be admitted for observation and to check     his enzymes to rule out MI.  If his pain resolves and his enzymes     are completely negative, I feel that he probably would be stable to     go home and have an outpatient Myoview, but I would want him to be     seen by Dr. Antoine Poche within 1 week after the hospitalization.  If     he has more pain during the night or if his enzymes are positive,     he should be kept for cardiac catheterizations.  All the      appropriate cardiac meds are being continued or started including     aspirin, IV heparin, and IV nitro for the evening.  Also, he is     already on a statin.     Luis Abed, MD, [facility]     JDK/MEDQ  D:  05/03/2011  T:  05/04/2011  Job:  [id number]  cc:   Ernestina Penna, M.D.  Electronically Signed by Willa Rough MD FACC on 05/04/2011 07:59:21 AM

## 2011-05-05 NOTE — Discharge Summary (Addendum)
  NAME:  JERY, HOLLERN NO.:  [id number]  MEDICAL RECORD NO.:  [id number]  LOCATION:  3705                         FACILITY:  [facility]  PHYSICIAN:  [facility], PA-C     DATE OF BIRTH:  02/08/1941  DATE OF ADMISSION:  05/03/2011 DATE OF DISCHARGE:  05/04/2011                              DISCHARGE SUMMARY   ADDENDUM:  FOLLOWUP APPOINTMENTS AND PLAN:  He will follow up with Dr. Antoine Poche in 1 week.          ______________________________ Berton Mount, PA-C     JH/MEDQ  D:  05/04/2011  T:  05/05/2011  Job:  [id number]  Electronically Signed by Berton Mount PA on 05/06/2011 08:36:23 AM Electronically Signed by Kristeen Miss M.D. on 05/15/2011 09:47:26 AM

## 2011-05-05 NOTE — Discharge Summary (Addendum)
NAME:  Cristian Moore, MORADO NO.:  [id number]  MEDICAL RECORD NO.:  [id number]  LOCATION:  3705                         FACILITY:  [facility]  PHYSICIAN:  Vesta Mixer, M.D. DATE OF BIRTH:  Jan 15, 1941  DATE OF ADMISSION:  05/03/2011 DATE OF DISCHARGE:  05/04/2011                              DISCHARGE SUMMARY   PRIMARY MEDICAL DOCTOR:  Ernestina Penna, MD  PRIMARY CARDIOLOGIST:  [name], MD, [facility]  DISCHARGE DIAGNOSIS:  Chest pain without objective evidence of ischemia.  SECONDARY DIAGNOSES: 1. Supraventricular tachycardia. 2. Carotid stenosis. 3. Hyperlipidemia. 4. Obstructive sleep apnea with continuous positive airway pressure. 5. Musculoskeletal pain in right shoulder. 6. Benign prostatic hypertrophy. 7. Diverticulosis. 8. Inguinal hernia repair. 9. Kidney stones. 10.Tonsillectomy.  ALLERGIES:  PENICILLIN.  PROCEDURES:  None.  HISTORY OF PRESENT ILLNESS:  [name] is a 70 year old male with a history as above who presented on May 03, 2011 with chest discomfort.  He has had similar chest pain in the past, but has had a nuclear scan showing no ischemia as well as a standard treadmill in February 2011 that showed no marked abnormality.  Chest pain began the night of May 02, 2011 under his left scapula and radiated to the anterior chest.  He was able to sleep at night, but had pain in his right shoulder that was chronic and needs repair.  He awoken in the morning with continued discomfort under his left scapula that persisted, so he presented to his primary care doctor's office where he received nitroglycerin.  He became hypotensive after this administration, but his discomfort had decreased somewhat.  He received fluids and was transferred to the [facility] emergency department where he received more fluids and more nitroglycerin with slight mild residual discomfort.  His EKG did not show any diagnostic changes.  He was in sinus rhythm  with borderline inferior Q-waves.  His screening troponin was normal.  Chest x-ray was without evidence of acute cardiopulmonary problems.  He was initiated on aspirin, IV heparin, and IV nitroglycerin.  He was already on a statin.  HOSPITAL COURSE:  He was admitted to cycle his cardiac enzymes and rule out for myocardial infarction. He was monitored for any further chest pain or signs of ischemia.  All 3 sets of his cardiac enzymes were negative and he had no further complaints of chest pain or documented evidence of ischemia.  DISCHARGE LABORATORY DATA:  White blood cell count 8, hemoglobin 13.2, hematocrit 39.3, platelet count 199.  Sodium 139, potassium 4.2, chloride 106, CO2 of 25, BUN 18, creatinine 1.05, glucose 98, calcium 9.3.  Cardiac enzymes negative x3.  BNP 11.1.  Total cholesterol 139, HDL 42, and LDL 78.  DISPOSITION:  The patient will be discharged in good condition to home.  FOLLOWUP PLANS AND APPOINTMENTS:  Mr. Certain was instructed to follow up with his primary care doctor as scheduled.  He should see Dr. Antoine Poche within this next week.   We will call him with his appointment time.  DISCHARGE MEDICATIONS: 1. Nitroglycerin 0.4 mg subinguinally every 5 minutes x3 as needed for     chest pain. 2. Aspirin 325 mg half tablet by mouth daily. 3.  CoQ10 by mouth daily. 4. Fish oil 1000 mg by mouth twice daily. 5. Melatonin 10 mg daily by mouth daily at bedtime. 6. Metoprolol tartrate 25 mg by mouth twice daily. 7. Omeprazole 20 mg by mouth twice daily. 8. Oxymetazoline nasal spray 1 puff inhaled as needed daily for nasal     congestion. 9. Simvastatin 20 mg by mouth daily at bedtime. 10.Sucralfate 1 g by mouth 4 times daily. 11.Flomax 0.4 mg by mouth daily at bedtime. 12.Vitamin D3 1000 units by mouth daily.  DURATION OF DISCHARGE ENCOUNTER:  40 minutes including MD time.    ______________________________ Berton Mount,  PA-C   ______________________________ Vesta Mixer, M.D.    JH/MEDQ  D:  05/04/2011  T:  05/05/2011  Job:  [id number]  cc:   [name], MD, [facility] Ernestina Penna, M.D.  Electronically Signed by Berton Mount PA on 05/06/2011 08:35:58 AM Electronically Signed by Kristeen Miss M.D. on 05/15/2011 09:47:23 AM

## 2011-05-09 ENCOUNTER — Ambulatory Visit (INDEPENDENT_AMBULATORY_CARE_PROVIDER_SITE_OTHER): Payer: Medicare Other | Admitting: Physician Assistant

## 2011-05-09 ENCOUNTER — Encounter: Payer: Self-pay | Admitting: Physician Assistant

## 2011-05-09 VITALS — BP 132/82 | HR 68 | Ht 72.0 in | Wt 261.0 lb

## 2011-05-09 DIAGNOSIS — I498 Other specified cardiac arrhythmias: Secondary | ICD-10-CM

## 2011-05-09 DIAGNOSIS — I6529 Occlusion and stenosis of unspecified carotid artery: Secondary | ICD-10-CM

## 2011-05-09 DIAGNOSIS — Z01818 Encounter for other preprocedural examination: Secondary | ICD-10-CM

## 2011-05-09 DIAGNOSIS — R079 Chest pain, unspecified: Secondary | ICD-10-CM

## 2011-05-09 NOTE — Patient Instructions (Addendum)
Your physician recommends that you schedule a follow-up appointment in: 3 months with Dr Antoine Poche Your physician has requested that you have a carotid duplex. This test is an ultrasound of the carotid arteries in your neck. It looks at blood flow through these arteries that supply the brain with blood. Allow one hour for this exam. There are no restrictions or special instructions.  Your physician has requested that you have en exercise stress myoview. For further information please visit [email or web address] Please follow instruction sheet, as given.

## 2011-05-09 NOTE — Assessment & Plan Note (Signed)
Stable on beta blocker and Pradaxa.

## 2011-05-09 NOTE — Progress Notes (Signed)
HPI:  This is a 70 year old white male patient of Dr. Koleen Nimrod has history of paroxysmal atrial fibrillation treated with metoprolol and productive. [facility] and ruled out for an MI. The pain occurred after lifting a heavy box and tearing it to his basement. He also has a huge hiatal hernia and is hard for him to distinguish what causes his chest pain. His primary care physician gave him a nitroglycerin which relieved the pain but he became hypotensive and he was sent to the hospital. He has a remote history of a cardiac catheter 25 years ago that was normal. His last routine stress test was in February 2011 which was normal. He denies recurrent chest pain, but is scheduled for rotator cuff surgery by Dr. Ranell Patrick May 17, 2011 and needs cardiac clearance.  Allergies  Allergen Reactions  . Penicillins Rash    Current Outpatient Prescriptions on File Prior to Visit  Medication Sig Dispense Refill  . metoprolol (LOPRESSOR) 50 MG tablet TAKE ONE-HALF TABLET BY MOUTH TWICE DAILY  30 tablet  6  . omeprazole (PRILOSEC) 20 MG capsule Take 20 mg by mouth 2 (two) times daily.        . simvastatin (ZOCOR) 20 MG tablet Take 20 mg by mouth at bedtime.        . Sucralfate (CARAFATE PO) Take 1 g by mouth 4 (four) times daily.       . Tamsulosin HCl (FLOMAX) 0.4 MG CAPS Take by mouth daily.         Past Medical History  Diagnosis Date  . Chest discomfort  05/03/2011   . Obstructive sleep apnea      continuous positive airway pressure  . Hyperlipidemia   . Musculoskeletal pain     in the right shoulder from his shoulder disease that eventually need surgery  . Supraventricular tachycardia   . Laceration  01/11/2009     Deep laceration left index finger dorsally  . Inguinal hernia   . Kidney stones   . Diverticulosis   . Carotid stenosis   . Exogenous obesity   . Nasal congestion   . Tachyarrhythmia   . Thyroiditis   . Diverticulitis   .  Benign prostatic hypertrophy     Past Surgical History  Procedure Date  . Inguinal hernia repair   . Kidney stone surgery   . Tonsillectomy   . Colonoscopy     for polyps    Family History  Problem Relation Age of Onset  . Stroke Mother   . Heart disease Mother     Valvular heart disease    History   Social History  . Marital Status: Married    Spouse Name: N/A    Number of Children: N/A  . Years of Education: N/A   Occupational History  . Not on file.   Social History Main Topics  . Smoking status: Former Games developer  . Smokeless tobacco: Not on file  . Alcohol Use: No  . Drug Use: Not on file  . Sexually Active: Not on file   Other Topics Concern  . Not on file   Social History Narrative  . No narrative on file    ROS: See HPI Eyes: Negative Ears:Negative for hearing loss, tinnitus Cardiovascular: Occasional palpitations. No recurrent chest pain,  dyspnea, dyspnea on exertion, near-syncope, orthopnea, paroxysmal nocturnal dyspnia and syncope,edema, claudication, cyanosis,.  Respiratory:   Negative for cough, hemoptysis, shortness of breath, sleep disturbances  due to breathing, sputum production and wheezing.   Endocrine: Negative for cold intolerance and heat intolerance.  Hematologic/Lymphatic: Negative for adenopathy and bleeding problem. Does not bruise/bleed easily.  Musculoskeletal: Negative.   Gastrointestinal: Large hiatal hernia and reflux Negative for nausea, vomiting, abdominal pain, diarrhea, constipation.   Neurological: Negative.  Allergic/Immunologic: Negative for environmental allergies.  PHYSICAL EXAM: Well-nournished, in no acute distress. Neck: No JVD, HJR, Bruit, or thyroid enlargement Lungs: No tachypnea, clear without wheezing, rales, or rhonchi Cardiovascular: RRR, PMI not displaced, heart sounds normal, no murmurs, gallops, bruit, thrill, or heave. Abdomen: BS normal. Soft without organomegaly, masses, lesions or  tenderness. Extremities: without cyanosis, clubbing or edema. Good distal pulses bilateral SKin: Warm, no lesions or rashes  Musculoskeletal: No deformities Neuro: no focal signs  BP 132/82  Pulse 68  Ht 6' (1.829 m)  Wt 261 lb (118.389 kg)  BMI 35.40 kg/m2  EKG: NSR with inferior Q waves and nonspecific ST T wave changes

## 2011-05-09 NOTE — Assessment & Plan Note (Signed)
Left carotid Dopplers April/21/11 showed stable mild carotid artery disease bilaterally with 0-39% bilateral ICA stenosis. We'll repeat.

## 2011-05-09 NOTE — Assessment & Plan Note (Signed)
Patient with recent admission with chest pain and ruled out for an MI. He has a remote cath appoximately 25 years ago that was normal, and a routine treadmill in February 2001 was normal. He does have multiple cardiac risk factors for coronary artery disease and has an up coming rotator cuff surgery November 16. We will schedule a stress Myoview to rule out ischemia.

## 2011-05-13 ENCOUNTER — Encounter (HOSPITAL_COMMUNITY): Payer: Self-pay | Admitting: Pharmacy Technician

## 2011-05-14 ENCOUNTER — Ambulatory Visit (HOSPITAL_COMMUNITY): Payer: Medicare Other | Attending: Cardiology | Admitting: Radiology

## 2011-05-14 VITALS — Ht 72.0 in | Wt 260.0 lb

## 2011-05-14 DIAGNOSIS — I4949 Other premature depolarization: Secondary | ICD-10-CM

## 2011-05-14 DIAGNOSIS — R0602 Shortness of breath: Secondary | ICD-10-CM | POA: Insufficient documentation

## 2011-05-14 DIAGNOSIS — R0789 Other chest pain: Secondary | ICD-10-CM | POA: Insufficient documentation

## 2011-05-14 DIAGNOSIS — Z8249 Family history of ischemic heart disease and other diseases of the circulatory system: Secondary | ICD-10-CM | POA: Insufficient documentation

## 2011-05-14 DIAGNOSIS — Z87891 Personal history of nicotine dependence: Secondary | ICD-10-CM | POA: Insufficient documentation

## 2011-05-14 DIAGNOSIS — R079 Chest pain, unspecified: Secondary | ICD-10-CM

## 2011-05-14 DIAGNOSIS — R0609 Other forms of dyspnea: Secondary | ICD-10-CM | POA: Insufficient documentation

## 2011-05-14 DIAGNOSIS — Z01818 Encounter for other preprocedural examination: Secondary | ICD-10-CM

## 2011-05-14 DIAGNOSIS — I059 Rheumatic mitral valve disease, unspecified: Secondary | ICD-10-CM | POA: Insufficient documentation

## 2011-05-14 DIAGNOSIS — E669 Obesity, unspecified: Secondary | ICD-10-CM | POA: Insufficient documentation

## 2011-05-14 DIAGNOSIS — Z01810 Encounter for preprocedural cardiovascular examination: Secondary | ICD-10-CM | POA: Insufficient documentation

## 2011-05-14 DIAGNOSIS — R5381 Other malaise: Secondary | ICD-10-CM | POA: Insufficient documentation

## 2011-05-14 DIAGNOSIS — E785 Hyperlipidemia, unspecified: Secondary | ICD-10-CM | POA: Insufficient documentation

## 2011-05-14 DIAGNOSIS — R0989 Other specified symptoms and signs involving the circulatory and respiratory systems: Secondary | ICD-10-CM | POA: Insufficient documentation

## 2011-05-14 MED ORDER — TECHNETIUM TC 99M TETROFOSMIN IV KIT
33.0000 | PACK | Freq: Once | INTRAVENOUS | Status: AC | PRN
  Administered 2011-05-14: 33 via INTRAVENOUS

## 2011-05-14 MED ORDER — TECHNETIUM TC 99M TETROFOSMIN IV KIT
11.0000 | PACK | Freq: Once | INTRAVENOUS | Status: AC | PRN
  Administered 2011-05-14: 11 via INTRAVENOUS

## 2011-05-14 NOTE — Progress Notes (Signed)
[facility] SITE 3 NUCLEAR MED [address] [place] [address] [phone]  Cardiology Nuclear Med Study  Cristian Moore is a 70 y.o. male [id number] [birth year]   Nuclear Med Background Indication for Stress Test:  Evaluation for Ischemia, Post Hospital on 05/04/11 with CP, (-) enzymes and Pending Surgical Clearance for (R) Rotator Cuff on 05/17/11 by Dr. Malon Kindle  History:  MVP and '87 Cath:Normal coronaries; '03 Echo:EF=50-55%; '06 ION:GEXBMW and infero-apical scar, no ischemia, EF=58%; '11 UXL:KGMWNU; H/O PAF/SVT Cardiac Risk Factors: Carotid Disease, Family History - CAD, History of Smoking, Lipids and Obesity  Symptoms:  Chest Pressure with and without Exertion, relieved with NTG (last episode of chest discomfort was yesterday), DOE, Fatigue and SOB   Nuclear Pre-Procedure Caffeine/Decaff Intake:  None NPO After: 7:00pm   Lungs:  Clear.   IV 0.9% NS with Angio Cath:  22g  IV Site: R Hand  IV Started by:  Stanton Kidney, EMT-P  Chest Size (in):  50 Cup Size: n/a  Height: 6' (1.829 m)  Weight:  260 lb (117.935 kg)  BMI:  Body mass index is 35.26 kg/(m^2). Tech Comments:  Metoprolol held > 24 hours, per patient.    Nuclear Med Study 1 or 2 day study: 1 day  Stress Test Type:  Stress  Reading MD: Olga Millers, MD  Order Authorizing Provider:  [name], MD; Jacolyn Reedy, PA-C  Resting Radionuclide: Technetium 55m Tetrofosmin  Resting Radionuclide Dose: 11 mCi   Stress Radionuclide:  Technetium 30m Tetrofosmin  Stress Radionuclide Dose: 33 mCi           Stress Protocol Rest HR: 72 Stress HR: 136  Rest BP: 103/72 Stress BP: 203/72  Exercise Time (min): 6:00 METS: 7.0   Predicted Max HR: 149 bpm % Max HR: 91.28 bpm Rate Pressure Product: 27253   Dose of Adenosine (mg):  n/a Dose of Lexiscan: n/a mg  Dose of Atropine (mg): n/a Dose of Dobutamine: n/a mcg/kg/min (at max HR)  Stress Test Technologist: Smiley Houseman, CMA-N  Nuclear  Technologist:  Domenic Polite, CNMT     Rest Procedure:  Myocardial perfusion imaging was performed at rest 45 minutes following the intravenous administration of Technetium 76m Tetrofosmin.  Rest ECG: No acute changes, occasional PVC's  Stress Procedure:  The patient exercised for six minutes on the treadmill utilizing the Bruce protocol.  The patient stopped due to fatigue.  He did c/o atypical chest pain.  There were no diagnostic ST-T wave changes, occasional PVC's with rare couplets.  Mild hypertensive response, 203/72.  Technetium 55m Tetrofosmin was injected at peak exercise and myocardial perfusion imaging was performed after a brief delay.  Stress ECG: No significant ST segment change suggestive of ischemia.  QPS Raw Data Images:  There is interference from nuclear activity from structures below the diaphragm. This does not affect the ability to read the study. Stress Images:  There is decreased uptake in the inferior wall and apex. Rest Images:  There is decreased uptake in the lateral wall and apex. Subtraction (SDS):  No evidence of ischemia. Transient Ischemic Dilatation (Normal <1.22):  1.04 Lung/Heart Ratio (Normal <0.45):  .32  Quantitative Gated Spect Images QGS EDV:  97 ml QGS ESV:  43 ml QGS cine images:  NL LV Function; NL Wall Motion QGS EF: 55%  Impression Exercise Capacity:  Fair exercise capacity. BP Response:  Hypertensive blood pressure response. Clinical Symptoms:  There is chest pain. ECG Impression:  No significant ST segment change  suggestive of ischemia. Comparison with Prior Nuclear Study: No images to compare  Overall Impression:  Normal stress nuclear study with a small fixed defect in the inferior wall and apex suggestive of thinning; no ischemia.     Olga Millers

## 2011-05-14 NOTE — H&P (Signed)
NAME:  Cristian Moore, Cristian Moore NO.:  [id number]  MEDICAL RECORD NO.:  [id number]  LOCATION:  PERIO                        FACILITY:  [facility]  PHYSICIAN:  Maisie Fus B. Janye Maynor, P.A.  DATE OF BIRTH:  [birth year]  DATE OF ADMISSION:  04/17/2011 DATE OF DISCHARGE:                             HISTORY & PHYSICAL   CHIEF COMPLAINT:  Right shoulder pain.  HISTORY OF PRESENT ILLNESS:  The patient is a 70 year old male with worsening right shoulder pain secondary to rotator cuff tear.  The patient has elected to have a right shoulder arthroscopy, mini-open rotator cuff repair and open distal clavicle excision by Dr. Malon Kindle to decrease pain and increase function.  PAST MEDICAL HISTORY:  History of supraventricular tachycardia, obstructive sleep apnea, hyperlipidemia.  FAMILY MEDICAL HISTORY:  Coronary artery disease.  ALLERGY:  The patient does have an allergy to PENICILLIN.  CURRENT MEDICATIONS:  Flomax 0.4 mg daily, metoprolol 25 mg b.i.d., Zocor 20 mg daily, omeprazole 20 mg b.i.d., Carafate daily.  REVIEW OF SYSTEMS:  Shows recent chest pain, incurred a hospital stay with Dr. Myrtis Ser to rule out MI, right shoulder pain and weakness secondary to rotator cuff tear.  Denies any coughs, fevers, chills, or illnesses recently.  PHYSICAL EXAMINATION:  GENERAL:  The patient is alert and appropriate 70-year-old male,  in no acute distress.  Pleasant mood and affect. HEAD and NECK:  Shows cranial nerves II-XII grossly intact.  His full range of motion in difficulty of the cervical spine. EXTREMITIES:  Right shoulder shows mild tenderness along the subacromial area and also the anterior distal clavicle.  He does have decreased range of motion in forward flexion 120 degrees, external rotation 30 degrees, internal rotation to his abdomen. Capillary refill is 2 seconds.  Neurovascularly intact distally.  Grip strength 5/5.  External rotation strength is 4/5. Left internal rotation  strength is 5/5.  Grip strength is 5/5 bilaterally.  The patient does have a normal heel-toe gait.  No rashes or edema.  MRI reviewed showing rotator cuff tear of the right shoulder with acromioclavicular arthrosis.  IMPRESSION:  Right shoulder pain secondary to rotator cuff tear and acromioclavicular arthrosis.  PLAN:  To have a right shoulder arthroscopy, rotator cuff repair by Dr. Malon Kindle to decrease pain and increase function.     Cristian Moore B. Ferne Coe.     TBD/MEDQ  D:  05/14/2011  T:  05/14/2011  Job:  [id number]

## 2011-05-15 ENCOUNTER — Encounter (HOSPITAL_COMMUNITY)
Admission: RE | Admit: 2011-05-15 | Discharge: 2011-05-15 | Disposition: A | Discharge status: Home / Self Care | Payer: Medicare Other | Source: Ambulatory Visit | Attending: Orthopedic Surgery | Admitting: Orthopedic Surgery

## 2011-05-15 ENCOUNTER — Encounter (HOSPITAL_COMMUNITY): Payer: Self-pay

## 2011-05-15 ENCOUNTER — Telehealth: Payer: Self-pay | Admitting: Cardiology

## 2011-05-15 LAB — CBC
Hemoglobin: 15.4 g/dL (ref 13.0–17.0)
MCHC: 32.9 g/dL (ref 30.0–36.0)
WBC: 7 10*3/uL (ref 4.0–10.5)

## 2011-05-15 LAB — BASIC METABOLIC PANEL
BUN: 14 mg/dL (ref 6–23)
Chloride: 101 mEq/L (ref 96–112)
GFR calc Af Amer: 82 mL/min — ABNORMAL LOW (ref >90)
Potassium: 5.1 mEq/L (ref 3.5–5.1)

## 2011-05-15 LAB — PROTIME-INR: Prothrombin Time: 13.3 seconds (ref 11.6–15.2)

## 2011-05-15 LAB — DIFFERENTIAL
Basophils Absolute: 0.1 10*3/uL (ref 0.0–0.1)
Basophils Relative: 1 % (ref 0–1)
Monocytes Relative: 13 % — ABNORMAL HIGH (ref 3–12)
Neutro Abs: 4 10*3/uL (ref 1.7–7.7)
Neutrophils Relative %: 58 % (ref 43–77)

## 2011-05-15 LAB — URINALYSIS, ROUTINE W REFLEX MICROSCOPIC
Ketones, ur: NEGATIVE mg/dL
Leukocytes, UA: NEGATIVE
Nitrite: NEGATIVE
Specific Gravity, Urine: 1.016 (ref 1.005–1.030)
pH: 7 (ref 5.0–8.0)

## 2011-05-15 LAB — SURGICAL PCR SCREEN: MRSA, PCR: NEGATIVE

## 2011-05-15 LAB — APTT: aPTT: 26 seconds (ref 24–37)

## 2011-05-15 NOTE — Telephone Encounter (Signed)
Called Cristian Moore and let her know clearance note has been included on pts nuclear study results by Dr Antoine Poche.  The results are in EPIC.  She will call back if she has any other further needs

## 2011-05-15 NOTE — Pre-Procedure Instructions (Signed)
Cristian Moore  05/15/2011   Your procedure is scheduled on:  Nov. 16, 2012  Report to [facility] at 9:00 AM.  Call this number if you have problems the morning of surgery: [phone]   Remember:   Do not eat food:After Midnight.  Do not drink clear liquids: 4 Hours before arrival.  Take these medicines the morning of surgery with A SIP OF WATER: Metoprolol, Prilosec, Flomax   Do not wear jewelry, make-up or nail polish.  Do not wear lotions, powders, or perfumes. You may wear deodorant.  Do not shave 48 hours prior to surgery.  Do not bring valuables to the hospital.  Contacts, dentures or bridgework may not be worn into surgery.  Leave suitcase in the car. After surgery it may be brought to your room.  For patients admitted to the hospital, checkout time is 11:00 AM the day of discharge.   Patients discharged the day of surgery will not be allowed to drive home.  Name and phone number of your driver: NA  Special Instructions: CHG Shower Use Special Wash: 1/2 bottle night before surgery and 1/2 bottle morning of surgery.   Please read over the following fact sheets that you were given: Pain Booklet and Surgical Site Infection Prevention

## 2011-05-15 NOTE — Telephone Encounter (Signed)
Shalo calling wanting to know if pt has surgical clearance for R shoulder rotator cuff repair on 11/16. Please return call to discuss further if necessary.

## 2011-05-15 NOTE — Progress Notes (Signed)
Cardiac clearance requested from attending cardiologist [facility] office. Nuclear stress test done 11/13/12n results WNL.

## 2011-05-16 ENCOUNTER — Telehealth: Payer: Self-pay | Admitting: Cardiology

## 2011-05-16 MED ORDER — VANCOMYCIN HCL 1000 MG IV SOLR
1500.0000 mg | INTRAVENOUS | Status: AC
  Administered 2011-05-17: 1500 mg via INTRAVENOUS
  Filled 2011-05-16: qty 1500

## 2011-05-16 NOTE — Telephone Encounter (Signed)
New problem:  Per Clydie Braun checking on status of clearance. Surgery on 11/16.

## 2011-05-16 NOTE — Telephone Encounter (Signed)
Clearance faxed to number requested

## 2011-05-17 ENCOUNTER — Encounter (HOSPITAL_COMMUNITY): Payer: Self-pay | Admitting: Anesthesiology

## 2011-05-17 ENCOUNTER — Encounter (HOSPITAL_COMMUNITY): Payer: Self-pay | Admitting: Orthopedic Surgery

## 2011-05-17 ENCOUNTER — Observation Stay (HOSPITAL_COMMUNITY)
Admission: RE | Admit: 2011-05-17 | Discharge: 2011-05-18 | Disposition: A | Discharge status: Home / Self Care | Payer: Medicare Other | Source: Ambulatory Visit | Attending: Orthopedic Surgery | Admitting: Orthopedic Surgery

## 2011-05-17 ENCOUNTER — Ambulatory Visit (HOSPITAL_COMMUNITY): Payer: Medicare Other | Admitting: Anesthesiology

## 2011-05-17 ENCOUNTER — Encounter (HOSPITAL_COMMUNITY)
Admission: RE | Disposition: A | Discharge status: Home / Self Care | Payer: Self-pay | Source: Ambulatory Visit | Attending: Orthopedic Surgery

## 2011-05-17 DIAGNOSIS — M67919 Unspecified disorder of synovium and tendon, unspecified shoulder: Secondary | ICD-10-CM | POA: Insufficient documentation

## 2011-05-17 DIAGNOSIS — E785 Hyperlipidemia, unspecified: Secondary | ICD-10-CM | POA: Insufficient documentation

## 2011-05-17 DIAGNOSIS — G4733 Obstructive sleep apnea (adult) (pediatric): Secondary | ICD-10-CM | POA: Insufficient documentation

## 2011-05-17 DIAGNOSIS — E669 Obesity, unspecified: Secondary | ICD-10-CM | POA: Insufficient documentation

## 2011-05-17 DIAGNOSIS — M719 Bursopathy, unspecified: Secondary | ICD-10-CM | POA: Insufficient documentation

## 2011-05-17 DIAGNOSIS — M19019 Primary osteoarthritis, unspecified shoulder: Principal | ICD-10-CM | POA: Insufficient documentation

## 2011-05-17 DIAGNOSIS — Z01812 Encounter for preprocedural laboratory examination: Secondary | ICD-10-CM | POA: Insufficient documentation

## 2011-05-17 DIAGNOSIS — Z5333 Arthroscopic surgical procedure converted to open procedure: Secondary | ICD-10-CM | POA: Insufficient documentation

## 2011-05-17 DIAGNOSIS — S43429A Sprain of unspecified rotator cuff capsule, initial encounter: Secondary | ICD-10-CM | POA: Diagnosis present

## 2011-05-17 DIAGNOSIS — E039 Hypothyroidism, unspecified: Secondary | ICD-10-CM | POA: Insufficient documentation

## 2011-05-17 SURGERY — SHOULDER ARTHROSCOPY WITH OPEN ROTATOR CUFF REPAIR AND DISTAL CLAVICLE ACROMINECTOMY
Anesthesia: General | Site: Shoulder | Laterality: Right | Wound class: Clean

## 2011-05-17 MED ORDER — SODIUM CHLORIDE 0.9 % IV SOLN
INTRAVENOUS | Status: DC
  Administered 2011-05-17: 16:00:00 via INTRAVENOUS

## 2011-05-17 MED ORDER — METOPROLOL SUCCINATE ER 25 MG PO TB24
25.0000 mg | ORAL_TABLET | Freq: Two times a day (BID) | ORAL | Status: DC
  Administered 2011-05-17 – 2011-05-18 (×2): 25 mg via ORAL
  Filled 2011-05-17 (×4): qty 1

## 2011-05-17 MED ORDER — MENTHOL 3 MG MT LOZG: 1.0000 | LOZENGE | OROMUCOSAL | Status: DC | PRN

## 2011-05-17 MED ORDER — PROMETHAZINE HCL 25 MG/ML IJ SOLN: 6.2500 mg | INTRAMUSCULAR | Status: DC | PRN

## 2011-05-17 MED ORDER — METHOCARBAMOL 100 MG/ML IJ SOLN
500.0000 mg | Freq: Four times a day (QID) | INTRAVENOUS | Status: DC | PRN
  Filled 2011-05-17: qty 5

## 2011-05-17 MED ORDER — OXYCODONE-ACETAMINOPHEN 5-325 MG PO TABS
1.0000 | ORAL_TABLET | ORAL | Status: DC | PRN
  Administered 2011-05-17: 1 via ORAL
  Administered 2011-05-18 (×2): 2 via ORAL
  Administered 2011-05-18: 1 via ORAL
  Filled 2011-05-17: qty 2
  Filled 2011-05-17: qty 1
  Filled 2011-05-17: qty 2
  Filled 2011-05-17: qty 1

## 2011-05-17 MED ORDER — BISACODYL 10 MG RE SUPP: 10.0000 mg | Freq: Every day | RECTAL | Status: DC | PRN

## 2011-05-17 MED ORDER — MELATONIN 10 MG PO TABS: 10.0000 mg | ORAL_TABLET | Freq: Every day | ORAL | Status: DC

## 2011-05-17 MED ORDER — PHENOL 1.4 % MT LIQD
1.0000 | OROMUCOSAL | Status: DC | PRN
  Filled 2011-05-17: qty 177

## 2011-05-17 MED ORDER — FENTANYL CITRATE 0.05 MG/ML IJ SOLN
INTRAMUSCULAR | Status: AC
  Filled 2011-05-17: qty 2

## 2011-05-17 MED ORDER — LACTATED RINGERS IV SOLN
INTRAVENOUS | Status: DC
  Administered 2011-05-17: 11:00:00 via INTRAVENOUS

## 2011-05-17 MED ORDER — HYDROMORPHONE HCL PF 1 MG/ML IJ SOLN: 0.2500 mg | INTRAMUSCULAR | Status: DC | PRN

## 2011-05-17 MED ORDER — MIDAZOLAM HCL 2 MG/2ML IJ SOLN
1.0000 mg | INTRAMUSCULAR | Status: DC | PRN
  Administered 2011-05-17 (×2): 1 mg via INTRAVENOUS

## 2011-05-17 MED ORDER — NITROGLYCERIN 0.4 MG SL SUBL: 0.4000 mg | SUBLINGUAL_TABLET | SUBLINGUAL | Status: DC | PRN

## 2011-05-17 MED ORDER — SIMVASTATIN 20 MG PO TABS
20.0000 mg | ORAL_TABLET | Freq: Every day | ORAL | Status: DC
  Administered 2011-05-17: 20 mg via ORAL
  Filled 2011-05-17 (×2): qty 1

## 2011-05-17 MED ORDER — SODIUM CHLORIDE 0.9 % IR SOLN
Status: DC | PRN
  Administered 2011-05-17: 3000 mL
  Administered 2011-05-17: 1000 mL

## 2011-05-17 MED ORDER — ASPIRIN 325 MG PO TABS
325.0000 mg | ORAL_TABLET | Freq: Every day | ORAL | Status: DC
  Administered 2011-05-18: 325 mg via ORAL
  Filled 2011-05-17: qty 1

## 2011-05-17 MED ORDER — OXYCODONE-ACETAMINOPHEN 5-325 MG PO TABS: 1.0000 | ORAL_TABLET | ORAL | Status: AC | PRN

## 2011-05-17 MED ORDER — ONDANSETRON HCL 4 MG/2ML IJ SOLN: 4.0000 mg | Freq: Four times a day (QID) | INTRAMUSCULAR | Status: DC | PRN

## 2011-05-17 MED ORDER — FOLIC ACID 0.5 MG HALF TAB
0.5000 mg | ORAL_TABLET | Freq: Every day | ORAL | Status: DC
  Administered 2011-05-18: 0.5 mg via ORAL
  Filled 2011-05-17: qty 1

## 2011-05-17 MED ORDER — ACETAMINOPHEN 650 MG RE SUPP: 650.0000 mg | Freq: Four times a day (QID) | RECTAL | Status: DC | PRN

## 2011-05-17 MED ORDER — SUCRALFATE 1 GM/10ML PO SUSP
1.0000 g | Freq: Two times a day (BID) | ORAL | Status: DC
  Administered 2011-05-17 – 2011-05-18 (×2): 1 g via ORAL
  Filled 2011-05-17 (×4): qty 10

## 2011-05-17 MED ORDER — ACETAMINOPHEN 325 MG PO TABS: 650.0000 mg | ORAL_TABLET | Freq: Four times a day (QID) | ORAL | Status: DC | PRN

## 2011-05-17 MED ORDER — FENTANYL CITRATE 0.05 MG/ML IJ SOLN
50.0000 ug | INTRAMUSCULAR | Status: DC | PRN
  Administered 2011-05-17: 50 ug via INTRAVENOUS

## 2011-05-17 MED ORDER — OXYMETAZOLINE HCL 0.05 % NA SOLN
2.0000 | NASAL | Status: DC | PRN
  Filled 2011-05-17: qty 15

## 2011-05-17 MED ORDER — FLEET ENEMA 7-19 GM/118ML RE ENEM: 1.0000 | ENEMA | Freq: Every day | RECTAL | Status: DC | PRN

## 2011-05-17 MED ORDER — LACTATED RINGERS IV SOLN
INTRAVENOUS | Status: DC | PRN
  Administered 2011-05-17 (×2): via INTRAVENOUS

## 2011-05-17 MED ORDER — BUPIVACAINE-EPINEPHRINE PF 0.25-1:200000 % IJ SOLN
INTRAMUSCULAR | Status: DC | PRN
  Administered 2011-05-17: 20 mL

## 2011-05-17 MED ORDER — METHOCARBAMOL 500 MG PO TABS
500.0000 mg | ORAL_TABLET | Freq: Four times a day (QID) | ORAL | Status: DC | PRN
  Administered 2011-05-18 (×2): 500 mg via ORAL
  Filled 2011-05-17 (×2): qty 1

## 2011-05-17 MED ORDER — VANCOMYCIN HCL IN DEXTROSE 1-5 GM/200ML-% IV SOLN
1000.0000 mg | Freq: Two times a day (BID) | INTRAVENOUS | Status: AC
  Administered 2011-05-17: 1000 mg via INTRAVENOUS
  Filled 2011-05-17: qty 200

## 2011-05-17 MED ORDER — MAGNESIUM HYDROXIDE 400 MG/5ML PO SUSP: 30.0000 mL | Freq: Two times a day (BID) | ORAL | Status: DC | PRN

## 2011-05-17 MED ORDER — TAMSULOSIN HCL 0.4 MG PO CAPS
0.4000 mg | ORAL_CAPSULE | Freq: Every day | ORAL | Status: DC
  Administered 2011-05-17 – 2011-05-18 (×2): 0.4 mg via ORAL
  Filled 2011-05-17 (×2): qty 1

## 2011-05-17 MED ORDER — METHOCARBAMOL 500 MG PO TABS: 500.0000 mg | ORAL_TABLET | Freq: Four times a day (QID) | ORAL | Status: AC | PRN

## 2011-05-17 MED ORDER — NEOSTIGMINE METHYLSULFATE 1 MG/ML IJ SOLN
INTRAMUSCULAR | Status: DC | PRN
  Administered 2011-05-17: 3 mg via INTRAVENOUS

## 2011-05-17 MED ORDER — GLYCOPYRROLATE 0.2 MG/ML IJ SOLN
INTRAMUSCULAR | Status: DC | PRN
  Administered 2011-05-17: .4 mg via INTRAVENOUS

## 2011-05-17 MED ORDER — HYDROMORPHONE HCL PF 1 MG/ML IJ SOLN: 0.5000 mg | INTRAMUSCULAR | Status: DC | PRN

## 2011-05-17 MED ORDER — ROCURONIUM BROMIDE 100 MG/10ML IV SOLN
INTRAVENOUS | Status: DC | PRN
  Administered 2011-05-17: 50 mg via INTRAVENOUS

## 2011-05-17 MED ORDER — BISACODYL 5 MG PO TBEC: 10.0000 mg | DELAYED_RELEASE_TABLET | Freq: Every day | ORAL | Status: DC | PRN

## 2011-05-17 MED ORDER — FOLIC ACID 400 MCG PO TABS: 400.0000 ug | ORAL_TABLET | Freq: Every day | ORAL | Status: DC

## 2011-05-17 MED ORDER — PANTOPRAZOLE SODIUM 40 MG PO TBEC
40.0000 mg | DELAYED_RELEASE_TABLET | Freq: Every day | ORAL | Status: DC
  Administered 2011-05-17 – 2011-05-18 (×2): 40 mg via ORAL
  Filled 2011-05-17 (×2): qty 1

## 2011-05-17 MED ORDER — PROPOFOL 10 MG/ML IV EMUL
INTRAVENOUS | Status: DC | PRN
  Administered 2011-05-17: 180 mg via INTRAVENOUS

## 2011-05-17 MED ORDER — KETOROLAC TROMETHAMINE 30 MG/ML IJ SOLN: 15.0000 mg | Freq: Once | INTRAMUSCULAR | Status: DC | PRN

## 2011-05-17 MED ORDER — ONDANSETRON HCL 4 MG PO TABS: 4.0000 mg | ORAL_TABLET | Freq: Four times a day (QID) | ORAL | Status: DC | PRN

## 2011-05-17 MED ORDER — MEPERIDINE HCL 25 MG/ML IJ SOLN: 6.2500 mg | INTRAMUSCULAR | Status: DC | PRN

## 2011-05-17 MED ORDER — METOCLOPRAMIDE HCL 10 MG PO TABS: 5.0000 mg | ORAL_TABLET | Freq: Three times a day (TID) | ORAL | Status: DC | PRN

## 2011-05-17 MED ORDER — DOCUSATE SODIUM 100 MG PO CAPS
100.0000 mg | ORAL_CAPSULE | Freq: Two times a day (BID) | ORAL | Status: DC
  Administered 2011-05-17 – 2011-05-18 (×2): 100 mg via ORAL
  Filled 2011-05-17 (×3): qty 1

## 2011-05-17 MED ORDER — BUPIVACAINE-EPINEPHRINE 0.25% -1:200000 IJ SOLN
INTRAMUSCULAR | Status: DC | PRN
  Administered 2011-05-17: 7 mL

## 2011-05-17 MED ORDER — METOCLOPRAMIDE HCL 5 MG/ML IJ SOLN
5.0000 mg | Freq: Three times a day (TID) | INTRAMUSCULAR | Status: DC | PRN
  Filled 2011-05-17: qty 2

## 2011-05-17 MED ORDER — VITAMIN D3 25 MCG (1000 UNIT) PO TABS
1000.0000 [IU] | ORAL_TABLET | Freq: Every day | ORAL | Status: DC
Start: 2011-05-18 — End: 2011-05-18
  Administered 2011-05-18: 1000 [IU] via ORAL
  Filled 2011-05-17: qty 1

## 2011-05-17 MED ORDER — MIDAZOLAM HCL 2 MG/2ML IJ SOLN
INTRAMUSCULAR | Status: AC
  Filled 2011-05-17: qty 2

## 2011-05-17 MED ORDER — POLYETHYLENE GLYCOL 3350 17 G PO PACK
17.0000 g | PACK | Freq: Every day | ORAL | Status: DC | PRN
  Filled 2011-05-17: qty 1

## 2011-05-17 SURGICAL SUPPLY — 68 items
ANCHOR JUGGERKNOT 2.9 (Anchor) ×2 IMPLANT
BLADE LONG MED 31X9 (MISCELLANEOUS) ×2 IMPLANT
BLADE SURG 11 STRL SS (BLADE) ×2 IMPLANT
BUR OVAL 4.0 (BURR) ×2 IMPLANT
CLOSURE STERI STRIP 1/2 X4 (GAUZE/BANDAGES/DRESSINGS) ×2 IMPLANT
CLOTH BEACON ORANGE TIMEOUT ST (SAFETY) ×2 IMPLANT
COVER SURGICAL LIGHT HANDLE (MISCELLANEOUS) ×2 IMPLANT
DRAPE INCISE IOBAN 66X45 STRL (DRAPES) ×2 IMPLANT
DRAPE STERI 35X30 U-POUCH (DRAPES) ×2 IMPLANT
DRAPE U-SHAPE 47X51 STRL (DRAPES) ×2 IMPLANT
DRILL BIT 5/64 (BIT) ×2 IMPLANT
DRSG EMULSION OIL 3X3 NADH (GAUZE/BANDAGES/DRESSINGS) ×2 IMPLANT
DRSG PAD ABDOMINAL 8X10 ST (GAUZE/BANDAGES/DRESSINGS) ×2 IMPLANT
DURAPREP 26ML APPLICATOR (WOUND CARE) ×2 IMPLANT
ELECT NEEDLE TIP 2.8 STRL (NEEDLE) ×2 IMPLANT
ELECT REM PT RETURN 9FT ADLT (ELECTROSURGICAL)
ELECTRODE REM PT RTRN 9FT ADLT (ELECTROSURGICAL) IMPLANT
GLOVE BIOGEL PI ORTHO PRO 7.5 (GLOVE) ×1
GLOVE BIOGEL PI ORTHO PRO SZ7 (GLOVE) ×1
GLOVE BIOGEL PI ORTHO PRO SZ8 (GLOVE) ×1
GLOVE ECLIPSE 8.5 STRL (GLOVE) ×2 IMPLANT
GLOVE ORTHO TXT STRL SZ7.5 (GLOVE) ×2 IMPLANT
GLOVE PI ORTHO PRO STRL 7.5 (GLOVE) ×1 IMPLANT
GLOVE PI ORTHO PRO STRL SZ7 (GLOVE) ×1 IMPLANT
GLOVE PI ORTHO PRO STRL SZ8 (GLOVE) ×1 IMPLANT
GLOVE SURG ORTHO 8.5 STRL (GLOVE) ×2 IMPLANT
GLOVE SURG SS PI 7.0 STRL IVOR (GLOVE) ×2 IMPLANT
GLOVE SURG SS PI 8.5 STRL IVOR (GLOVE) ×1
GLOVE SURG SS PI 8.5 STRL STRW (GLOVE) ×1 IMPLANT
GOWN PREVENTION PLUS XXLARGE (GOWN DISPOSABLE) ×2 IMPLANT
GOWN STRL NON-REIN LRG LVL3 (GOWN DISPOSABLE) IMPLANT
GOWN STRL REIN XL XLG (GOWN DISPOSABLE) ×6 IMPLANT
KIT BASIN OR (CUSTOM PROCEDURE TRAY) ×2 IMPLANT
KIT JUGGERKNOT DISP 2.9MM (KITS) ×2 IMPLANT
KIT ROOM TURNOVER OR (KITS) ×2 IMPLANT
MANIFOLD NEPTUNE II (INSTRUMENTS) ×2 IMPLANT
NDL SUT 6 .5 CRC .975X.05 MAYO (NEEDLE) ×1 IMPLANT
NEEDLE HYPO 25GX1X1/2 BEV (NEEDLE) ×2 IMPLANT
NEEDLE MAYO TAPER (NEEDLE) ×1
NEEDLE SPNL 18GX3.5 QUINCKE PK (NEEDLE) ×2 IMPLANT
NS IRRIG 1000ML POUR BTL (IV SOLUTION) ×2 IMPLANT
PACK SHOULDER (CUSTOM PROCEDURE TRAY) ×2 IMPLANT
PAD ARMBOARD 7.5X6 YLW CONV (MISCELLANEOUS) ×2 IMPLANT
RESECTOR FULL RADIUS 4.2MM (BLADE) ×2 IMPLANT
SET ARTHROSCOPY TUBING (MISCELLANEOUS) ×1
SET ARTHROSCOPY TUBING LN (MISCELLANEOUS) ×1 IMPLANT
SLING ARM FOAM STRAP LRG (SOFTGOODS) ×2 IMPLANT
SLING ARM FOAM STRAP MED (SOFTGOODS) IMPLANT
SPONGE GAUZE 4X4 12PLY (GAUZE/BANDAGES/DRESSINGS) ×2 IMPLANT
SPONGE LAP 4X18 X RAY DECT (DISPOSABLE) ×2 IMPLANT
STRIP CLOSURE SKIN 1/2X4 (GAUZE/BANDAGES/DRESSINGS) ×2 IMPLANT
SUCTION FRAZIER TIP 10 FR DISP (SUCTIONS) ×2 IMPLANT
SUT BONE WAX W31G (SUTURE) ×2 IMPLANT
SUT FIBERWIRE #2 38 T-5 BLUE (SUTURE) ×2
SUT MNCRL AB 4-0 PS2 18 (SUTURE) ×2 IMPLANT
SUT VIC AB 0 CT1 27 (SUTURE) ×1
SUT VIC AB 0 CT1 27XBRD ANBCTR (SUTURE) ×1 IMPLANT
SUT VIC AB 0 CT2 27 (SUTURE) IMPLANT
SUT VIC AB 2-0 CT1 27 (SUTURE) ×2
SUT VIC AB 2-0 CT1 TAPERPNT 27 (SUTURE) ×2 IMPLANT
SUT VICRYL 0 CT 1 36IN (SUTURE) ×6 IMPLANT
SUTURE FIBERWR #2 38 T-5 BLUE (SUTURE) ×1 IMPLANT
SYR CONTROL 10ML LL (SYRINGE) ×2 IMPLANT
TOWEL OR 17X24 6PK STRL BLUE (TOWEL DISPOSABLE) ×2 IMPLANT
TOWEL OR 17X26 10 PK STRL BLUE (TOWEL DISPOSABLE) ×2 IMPLANT
TUBE CONNECTING 12X1/4 (SUCTIONS) ×2 IMPLANT
WAND 90 DEG TURBOVAC W/CORD (SURGICAL WAND) ×2 IMPLANT
WATER STERILE IRR 1000ML POUR (IV SOLUTION) ×2 IMPLANT

## 2011-05-17 NOTE — Anesthesia Procedure Notes (Addendum)
Anesthesia Regional Block:  Interscalene brachial plexus block  Pre-Anesthetic Checklist: ,, timeout performed, Correct Patient, Correct Site, Correct Laterality, Correct Procedure, Correct Position, site marked, Risks and benefits discussed, at surgeon's request and post-op pain management  Laterality: Upper  Prep: Betadine       Needles:  Injection technique: Single-shot  Needle Type: Stimulator Needle - 40      Needle Gauge: 22 and 22 G  Needle insertion depth: 2 cm   Additional Needles:  Procedures: nerve stimulator Interscalene brachial plexus block  Nerve Stimulator or Paresthesia:  Response: Twitch elicited, 0.5 mA, 0.3 ms,   Additional Responses:   Narrative:  Start time: 05/17/2011 11:00 AM End time: 05/17/2011 11:08 AM  Performed by: Personally   Additional Notes: Block assessed prior to start of surgery. Tolerated well  Interscalene brachial plexus block Procedure Name: Intubation Date/Time: 05/17/2011 11:30 AM Performed by: Darcey Nora Pre-anesthesia Checklist: Patient identified, Emergency Drugs available, Suction available and Patient being monitored Patient Re-evaluated:Patient Re-evaluated prior to inductionOxygen Delivery Method: Circle System Utilized Preoxygenation: Pre-oxygenation with 100% oxygen Intubation Type: IV induction Ventilation: Mask ventilation without difficulty Laryngoscope Size: Mac and 3 Grade View: Grade I Tube type: Oral Tube size: 7.5 mm Number of attempts: 1 Airway Equipment and Method: stylet and video-laryngoscopy Placement Confirmation: ETT inserted through vocal cords under direct vision,  breath sounds checked- equal and bilateral and positive ETCO2 Secured at: 23 cm Tube secured with: Tape Dental Injury: Teeth and Oropharynx as per pre-operative assessment

## 2011-05-17 NOTE — Preoperative (Signed)
Beta Blockers   Reason not to administer Beta Blockers:Metoprolol 05/17/2011 a.m.

## 2011-05-17 NOTE — Interval H&P Note (Signed)
History and Physical Interval Note:   05/17/2011   10:50 AM   Cristian Moore  has presented today for surgery, with the diagnosis of right shoulder rotator cuff tear, inpingment and OA  The various methods of treatment have been discussed with the patient and family. After consideration of risks, benefits and other options for treatment, the patient has consented to  Procedure(s): SHOULDER ARTHROSCOPY WITH OPEN ROTATOR CUFF REPAIR AND DISTAL CLAVICLE ACROMINECTOMY as a surgical intervention .  The patients' history has been reviewed, patient examined, no change in status, stable for surgery.  I have reviewed the patients' chart and labs.  Questions were answered to the patient's satisfaction.     Verlee Rossetti  MD

## 2011-05-17 NOTE — Anesthesia Postprocedure Evaluation (Signed)
  Anesthesia Post-op Note  Patient: Cristian Moore  Procedure(s) Performed:  SHOULDER ARTHROSCOPY WITH OPEN ROTATOR CUFF REPAIR AND DISTAL CLAVICLE ACROMINECTOMY - with regional block  Patient Location: PACU  Anesthesia Type: GA combined with regional for post-op pain  Level of Consciousness: awake  Airway and Oxygen Therapy: Patient connected to nasal cannula oxygen  Post-op Pain: none  Post-op Assessment: Post-op Vital signs reviewed  Post-op Vital Signs: stable  Complications: No apparent anesthesia complications

## 2011-05-17 NOTE — Brief Op Note (Signed)
05/17/2011  12:54 PM  PATIENT:  Cristian Moore  70 y.o. male  PRE-OPERATIVE DIAGNOSIS:  right shoulder rotator cuff tear, AC joint DJD, symptomatic  POST-OPERATIVE DIAGNOSIS:  right shoulder rotator cuff tear, AC joint DJD, symptomatic  PROCEDURE:  Procedure(s): SHOULDER ARTHROSCOPY WITH ARTHROSCOPIC SAD, MINI-OPEN ROTATOR CUFF REPAIR AND DISTAL CLAVICLE EXCISION  SURGEON:  Surgeon(s): Verlee Rossetti  PHYSICIAN ASSISTANT:   ASSISTANTS: Donnie Coffin. Dixon, PA-C   ANESTHESIA:   regional and general  EBL:  Total I/O In: 1000 [I.V.:1000] Out: 10 [Blood:10]  BLOOD ADMINISTERED:none  DRAINS: none   LOCAL MEDICATIONS USED:  MARCAINE 10 CC  SPECIMEN:  No Specimen  DISPOSITION OF SPECIMEN:  PATHOLOGY  COUNTS:  YES  TOURNIQUET:  * No tourniquets in log *  DICTATION: .Other Dictation: Dictation Number  DICTATED  PLAN OF CARE: Admit to inpatient   PATIENT DISPOSITION:  PACU - hemodynamically stable.   Delay start of Pharmacological VTE agent (>24hrs) due to surgical blood loss or risk of bleeding:  {YES/NO/NOT APPLICABLE:20182

## 2011-05-17 NOTE — Anesthesia Preprocedure Evaluation (Addendum)
Anesthesia Evaluation  Patient identified by MRN, date of birth, ID band Patient awake    Reviewed: Allergy & Precautions, H&P , NPO status , Patient's Chart, lab work & pertinent test results, reviewed documented beta blocker date and time   History of Anesthesia Complications (+) PONV  Airway Mallampati: II  Neck ROM: Full    Dental  (+) Teeth Intact, Caps and Dental Advisory Given   Pulmonary sleep apnea and Continuous Positive Airway Pressure Ventilation ,  clear to auscultation        Cardiovascular Exercise Tolerance: Good neg cardio ROS Regular Normal    Neuro/Psych  Neuromuscular disease    GI/Hepatic negative GI ROS,   Endo/Other  Hypothyroidism   Renal/GU      Musculoskeletal  (+) Arthritis -, Osteoarthritis,    Abdominal (+) obese,   Peds  Hematology   Anesthesia Other Findings   Reproductive/Obstetrics                         Anesthesia Physical Anesthesia Plan  ASA: II  Anesthesia Plan: General   Post-op Pain Management:    Induction: Intravenous  Airway Management Planned: Oral ETT  Additional Equipment:   Intra-op Plan:   Post-operative Plan: Extubation in OR  Informed Consent: I have reviewed the patients History and Physical, chart, labs and discussed the procedure including the risks, benefits and alternatives for the proposed anesthesia with the patient or authorized representative who has indicated his/her understanding and acceptance.   Dental advisory given  Plan Discussed with: CRNA and Surgeon  Anesthesia Plan Comments:         Anesthesia Quick Evaluation

## 2011-05-17 NOTE — Discharge Summary (Signed)
Physician Discharge Summary  Patient ID: Cristian Moore MRN: [id number] DOB/AGE: 71-Aug-1942 71 y.o.  Admit date: 05/17/2011 Discharge date: 05/17/2011  Admission Diagnoses:  Principal Problem:  *Rotator cuff (capsule) sprain   Discharge Diagnoses:  Same   Surgeries: Procedure(s): SHOULDER ARTHROSCOPY WITH OPEN ROTATOR CUFF REPAIR AND DISTAL CLAVICLE ACROMINECTOMY on 05/17/2011   Consultants: none  Discharged Condition: Stable  Hospital Course: Cristian Moore is an 69 y.o. male who was admitted 05/17/2011 with a chief complaint of No chief complaint on file. , and found to have a diagnosis of Rotator cuff (capsule) sprain.  They were brought to the operating room on 05/17/2011 and underwent the above named procedures.   Pt had evaluation with PT/OT and did well  The patient had an uncomplicated hospital course and was stable for discharge.  Recent vital signs:  Filed Vitals:   05/17/11 1530  BP: 138/79  Pulse: 69  Temp:   Resp: 12    Recent laboratory studies:  Results for orders placed during the hospital encounter of 05/17/11  CBC      Component Value Range   WBC 7.0  4.0 - 10.5 (K/uL)   RBC 4.90  4.22 - 5.81 (MIL/uL)   Hemoglobin 15.4  13.0 - 17.0 (g/dL)   HCT 40.9  81.1 - 91.4 (%)   MCV 95.5  78.0 - 100.0 (fL)   MCH 31.4  26.0 - 34.0 (pg)   MCHC 32.9  30.0 - 36.0 (g/dL)   RDW 78.2  95.6 - 21.3 (%)   Platelets 237  150 - 400 (K/uL)  DIFFERENTIAL      Component Value Range   Neutrophils Relative 58  43 - 77 (%)   Neutro Abs 4.0  1.7 - 7.7 (K/uL)   Lymphocytes Relative 26  12 - 46 (%)   Lymphs Abs 1.8  0.7 - 4.0 (K/uL)   Monocytes Relative 13 (*) 3 - 12 (%)   Monocytes Absolute 0.9  0.1 - 1.0 (K/uL)   Eosinophils Relative 2  0 - 5 (%)   Eosinophils Absolute 0.1  0.0 - 0.7 (K/uL)   Basophils Relative 1  0 - 1 (%)   Basophils Absolute 0.1  0.0 - 0.1 (K/uL)  BASIC METABOLIC PANEL      Component Value Range   Sodium 137  135 - 145 (mEq/L)   Potassium  5.1  3.5 - 5.1 (mEq/L)   Chloride 101  96 - 112 (mEq/L)   CO2 30  19 - 32 (mEq/L)   Glucose, Bld 114 (*) 70 - 99 (mg/dL)   BUN 14  6 - 23 (mg/dL)   Creatinine, Ser 0.86  0.50 - 1.35 (mg/dL)   Calcium 57.8 (*) 8.4 - 10.5 (mg/dL)   GFR calc non Af Amer 71 (*) >90 (mL/min)   GFR calc Af Amer 82 (*) >90 (mL/min)  PROTIME-INR      Component Value Range   Prothrombin Time 13.3  11.6 - 15.2 (seconds)   INR 0.99  0.00 - 1.49   APTT      Component Value Range   aPTT 26  24 - 37 (seconds)    Discharge Medications:   Current Discharge Medication List    START taking these medications   Details  methocarbamol (ROBAXIN) 500 MG tablet Take 1 tablet (500 mg total) by mouth 4 (four) times daily as needed (SPASMS). Qty: 60 tablet, Refills: 1    oxyCODONE-acetaminophen (ROXICET) 5-325 MG per tablet Take 1 tablet by mouth every 4 (  four) hours as needed for pain. Qty: 60 tablet, Refills: 0      CONTINUE these medications which have NOT CHANGED   Details  aspirin 325 MG tablet Take 325 mg by mouth daily. Half a tablet daily    cholecalciferol (VITAMIN D) 1000 UNITS tablet Take 1,000 Units by mouth daily.     co-enzyme Q-10 30 MG capsule Take 30 mg by mouth daily.     fish oil-omega-3 fatty acids 1000 MG capsule Take 1 g by mouth 2 (two) times daily.     folic acid (FOLVITE) 400 MCG tablet Take 400 mcg by mouth daily.     Melatonin 10 MG TABS Take 10 mg by mouth daily.     metoprolol (TOPROL-XL) 50 MG 24 hr tablet Take 25 mg by mouth 2 (two) times daily.      omeprazole (PRILOSEC) 20 MG capsule Take 20 mg by mouth 2 (two) times daily.     simvastatin (ZOCOR) 20 MG tablet Take 20 mg by mouth at bedtime.     Tamsulosin HCl (FLOMAX) 0.4 MG CAPS Take 0.4 mg by mouth daily.     NITROSTAT 0.4 MG SL tablet Place 0.4 mg under the tongue every 5 (five) minutes as needed. Chest pains    oxymetazoline (AFRIN) 0.05 % nasal spray Place 2 sprays into the nose as needed. Nasal congestion      Sucralfate (CARAFATE PO) Take 1 g by mouth 4 (four) times daily.         Diagnostic Studies: Dg Chest Portable 1 View  05/03/2011  *RADIOLOGY REPORT*  Clinical Data: Tachycardia, chest pain  PORTABLE CHEST - 1 VIEW  Comparison: 01/01/2011  Findings: Minimal left basilar atelectasis.  Lungs otherwise clear. No pleural effusion or pneumothorax.  The heart is top normal in size.  IMPRESSION: No evidence of acute cardiopulmonary disease.  Original Report Authenticated By: Charline Bills, M.D.    Disposition: Home or Self Care  Discharge Orders    Future Appointments: Provider: Department: Dept Phone: Center:   05/20/2011 10:30 AM [place] App[place], [facility] [phone] [facility]   06/10/2011 12:30 PM [facility]  None   08/12/2011 10:30 AM [name], MD [facility] [phone] [facility]      Follow-up Information    Follow up with [name],[name]. Make an appointment in 2 weeks.   Contact information:   [facility] [address], [address] [place] [address] [phone]           Signed: Thea Gist 05/17/2011, 4:22 PM

## 2011-05-17 NOTE — Discharge Summary (Signed)
I have personally examined the patient and agree with the evaluation and treatment plan as documented by Thomas B. Dixon, PA-C.  [name]. Philip Kotlyar, MD [facility] [phone] office [phone] pager  

## 2011-05-17 NOTE — Op Note (Signed)
Cristian Kitchen  Moore, Cristian Moore NO.:  [id number]  MEDICAL RECORD NO.:  [id number]  LOCATION:  5017                         FACILITY:  [facility]  PHYSICIAN:  Almedia Balls. Ranell Patrick, M.D. DATE OF BIRTH:  [birth year]  DATE OF PROCEDURE:  05/17/2011 DATE OF DISCHARGE:                              OPERATIVE REPORT   PREOPERATIVE DIAGNOSIS:  Right shoulder rotator cuff tear and acromioclavicular joint arthritis, symptomatic.  POSTOPERATIVE DIAGNOSIS:  Right shoulder rotator cuff tear and acromioclavicular joint arthritis, symptomatic.  PROCEDURE PERFORMED:  Right shoulder arthroscopy with extensive intra- articular debridement including debridement of anterior degenerative labral tear, arthroscopic subacromial decompression followed by mini open rotator cuff repair and open distal clavicle resection.  ATTENDING SURGEON:  Almedia Balls. Trysten Bernard, MD  ASSISTANT:  Modesto Charon, PA-C who scrubbed during the entirety of the procedure and necessary for proper completion of procedure and visualization through arthroscopy and position of the shoulder as well as retraction for open portion of surgery.  ANESTHESIA:  General anesthesia plus interscalene block anesthesia was used.  ESTIMATED BLOOD LOSS:  Minimal.  FLUID REPLACEMENT:  1200 mL crystalloid.  INSTRUMENT COUNTS:  Correct.  No complications.  Perioperative antibiotics were given.  INDICATIONS:  The patient is a 70 year old male with worsening right shoulder pain and loss of function secondary to rotator cuff tear.  The patient has failed extensive period of conservative management, desires operative treatment to restore function and eliminate pain to the right shoulder.  He is having daily pain, night pain, rest pain, and has had progression of pain despite conservative management.  DESCRIPTION OF PROCEDURE:  After adequate level of anesthesia was achieved, the patient was positioned in modified beach chair position. Right  shoulder was correctly identified.  The patient was placed in modified beach chair position.  All neurovascular structures were padded appropriately.  Right shoulder was sterilely prepped and draped in usual manner.  Time-out was called.  After sterile prep and drape of the right shoulder, we entered the shoulder using standard arthroscopic portals including anterior, posterior, and lateral portals.  We identified some synovitis in the anterior degenerative labral fraying which we debrided using a motorized shaver.  The remainder of labrum was normal.  The articular cartilage was normal.  Subscap was normal.  Rotator cuff was torn.  This appeared to be a deep undersurface tear, superior labrum biceps junction intact.  We pulled the biceps and the shoulder and was intact.  We entered the subacromial space, performed a thorough bursectomy and acromioplasty creating a type 1 acromial shape.  There was a soft spot in the region of the tear which was consistent with the rotator cuff tear from the underside.  We made sure that we had a thorough rotator cuff outlet decompression.  Once that was done, we concluded the arthroscopic portion of the surgery and then made a saber incision overlying the AC joint.  Dissection down through the subcutaneous tissues using Bovie, deltotrapezial fascia identified and divided in line with this clavicle.  Subperiosteal dissection of distal clavicle performed followed by excision of distal 3 mm using oscillating saw.  We then applied bone wax to cut in the clavicle.  We irrigated thoroughly the  AC interval and closed the deltoid.  We also removed a large spur off the acromion which was adjacent to the [facility] joint.  We closed the deltotrapezial fascia with 0 Vicryl suture followed by 2-0 Vicryl subcutaneous closure and 4-0 nylon.  We then directed our attention towards the rotator cuff tear, which we performed through a mini open incision starting at the  anterolateral border of the acromion staying distally about 3 cm.  Dissection down through subcutaneous tissues.  We identified the deltoid raphe and developed these interval there at the raphe between the anterior and lateral heads.  We placed our Arthrex retractor to identify the torn rotator cuff, which was a soft spot and fissure line just posterior to the bicipital groove.  We incised that longitudinally with the #15 blade.  Immediately entered degenerative tendinous tissue.  We removed some of that using rongeur and we used a curette to prepare the rotator cuff footprint, placed a single 2.8 Biomet JuggerKnot suture anchor at the articular margin.  We brought that double loaded nonabsorbable #2 suture out through the anterior and posterior limbs of the rotator cuff repair and anatomically side-to-side and also did an FiberWire out just lateral to that and 0 Vicryl lateral to that.  We had anatomic repair not under tension of the rotator cuff.  We then thoroughly irrigated subdeltoid interval and closed the deltoid to itself with 0 Vicryl suture followed by 2-0 Vicryl subcutaneous closure and 4-0 Monocryl for skin.  Steri-Strips applied followed by sterile dressing.  The patient tolerated the surgery well.     Almedia Balls. Ranell Patrick, M.D.     SRN/MEDQ  D:  05/17/2011  T:  05/17/2011  Job:  [id number]

## 2011-05-17 NOTE — Transfer of Care (Signed)
Immediate Anesthesia Transfer of Care Note  Patient: Cristian Moore  Procedure(s) Performed:  SHOULDER ARTHROSCOPY WITH OPEN ROTATOR CUFF REPAIR AND DISTAL CLAVICLE ACROMINECTOMY - with regional block  Patient Location: PACU  Anesthesia Type: General  Level of Consciousness: awake, alert  and oriented  Airway & Oxygen Therapy: Patient Spontanous Breathing and Patient connected to nasal cannula oxygen  Post-op Assessment: Report given to PACU RN and Post -op Vital signs reviewed and stable  Post vital signs: Reviewed and stable  Complications: No apparent anesthesia complications

## 2011-05-17 NOTE — Progress Notes (Signed)
Good radial pulse, sensation and movement present.

## 2011-05-18 LAB — BASIC METABOLIC PANEL
Calcium: 8.9 mg/dL (ref 8.4–10.5)
GFR calc non Af Amer: 71 mL/min — ABNORMAL LOW (ref >90)
Glucose, Bld: 130 mg/dL — ABNORMAL HIGH (ref 70–99)
Sodium: 135 mEq/L (ref 135–145)

## 2011-05-18 LAB — HEMOGLOBIN AND HEMATOCRIT, BLOOD: HCT: 37.1 % — ABNORMAL LOW (ref 39.0–52.0)

## 2011-05-18 NOTE — Progress Notes (Signed)
Occupational Therapy Shoulder Evaluation  Evaluation completed and filed in ghost chart. Will not follow acutely as education has been completed and patient will have 24 hour assistance from wife upon d/c home. Pt did very well and is safe to d/c home. No equipment needed. Recommend progress rehab of shoulder as ordered by MD post follow-up appointment.   05/18/2011 Cipriano Mile OTR/L Pager [phone] Office [phone]

## 2011-05-18 NOTE — Progress Notes (Signed)
Pt. Stable. N/V = rue. Dsg dry. Wife in room. Disch. Home. Plan on chart.

## 2011-05-20 ENCOUNTER — Ambulatory Visit: Payer: Medicare Other | Attending: Orthopedic Surgery | Admitting: Physical Therapy

## 2011-05-20 DIAGNOSIS — M25619 Stiffness of unspecified shoulder, not elsewhere classified: Secondary | ICD-10-CM | POA: Insufficient documentation

## 2011-05-20 DIAGNOSIS — IMO0001 Reserved for inherently not codable concepts without codable children: Secondary | ICD-10-CM | POA: Insufficient documentation

## 2011-05-20 DIAGNOSIS — R5381 Other malaise: Secondary | ICD-10-CM | POA: Insufficient documentation

## 2011-05-20 DIAGNOSIS — M25519 Pain in unspecified shoulder: Secondary | ICD-10-CM | POA: Insufficient documentation

## 2011-05-21 ENCOUNTER — Emergency Department (HOSPITAL_COMMUNITY)
Admission: EM | Admit: 2011-05-21 | Discharge: 2011-05-21 | Disposition: A | Discharge status: Home / Self Care | Payer: Medicare Other | Attending: Emergency Medicine | Admitting: Emergency Medicine

## 2011-05-21 ENCOUNTER — Encounter (HOSPITAL_COMMUNITY): Payer: Self-pay | Admitting: Emergency Medicine

## 2011-05-21 ENCOUNTER — Emergency Department (HOSPITAL_COMMUNITY): Payer: Medicare Other

## 2011-05-21 ENCOUNTER — Other Ambulatory Visit: Payer: Self-pay

## 2011-05-21 ENCOUNTER — Ambulatory Visit: Payer: Medicare Other | Admitting: *Deleted

## 2011-05-21 DIAGNOSIS — R0600 Dyspnea, unspecified: Secondary | ICD-10-CM

## 2011-05-21 DIAGNOSIS — G4733 Obstructive sleep apnea (adult) (pediatric): Secondary | ICD-10-CM | POA: Insufficient documentation

## 2011-05-21 DIAGNOSIS — R61 Generalized hyperhidrosis: Secondary | ICD-10-CM | POA: Insufficient documentation

## 2011-05-21 DIAGNOSIS — R0609 Other forms of dyspnea: Secondary | ICD-10-CM | POA: Insufficient documentation

## 2011-05-21 DIAGNOSIS — E785 Hyperlipidemia, unspecified: Secondary | ICD-10-CM | POA: Insufficient documentation

## 2011-05-21 DIAGNOSIS — R0989 Other specified symptoms and signs involving the circulatory and respiratory systems: Secondary | ICD-10-CM | POA: Insufficient documentation

## 2011-05-21 DIAGNOSIS — R079 Chest pain, unspecified: Secondary | ICD-10-CM | POA: Insufficient documentation

## 2011-05-21 DIAGNOSIS — R0602 Shortness of breath: Secondary | ICD-10-CM | POA: Insufficient documentation

## 2011-05-21 DIAGNOSIS — J9811 Atelectasis: Secondary | ICD-10-CM

## 2011-05-21 DIAGNOSIS — R918 Other nonspecific abnormal finding of lung field: Secondary | ICD-10-CM | POA: Insufficient documentation

## 2011-05-21 LAB — POCT I-STAT, CHEM 8
BUN: 20 mg/dL (ref 6–23)
Calcium, Ion: 1.19 mmol/L (ref 1.12–1.32)
HCT: 45 % (ref 39.0–52.0)
Sodium: 141 mEq/L (ref 135–145)
TCO2: 29 mmol/L (ref 0–100)

## 2011-05-21 LAB — PROTIME-INR
INR: 0.97 (ref 0.00–1.49)
Prothrombin Time: 13.1 seconds (ref 11.6–15.2)

## 2011-05-21 LAB — DIFFERENTIAL
Basophils Relative: 1 % (ref 0–1)
Lymphs Abs: 2.1 10*3/uL (ref 0.7–4.0)
Monocytes Relative: 9 % (ref 3–12)
Neutro Abs: 5.3 10*3/uL (ref 1.7–7.7)
Neutrophils Relative %: 62 % (ref 43–77)

## 2011-05-21 LAB — CBC
MCH: 31.5 pg (ref 26.0–34.0)
MCHC: 33.6 g/dL (ref 30.0–36.0)
Platelets: 237 10*3/uL (ref 150–400)
RBC: 4.66 MIL/uL (ref 4.22–5.81)

## 2011-05-21 LAB — POCT I-STAT TROPONIN I: Troponin i, poc: 0 ng/mL (ref 0.00–0.08)

## 2011-05-21 MED ORDER — IOHEXOL 350 MG/ML SOLN
100.0000 mL | Freq: Once | INTRAVENOUS | Status: AC | PRN
  Administered 2011-05-21: 100 mL via INTRAVENOUS

## 2011-05-21 MED ORDER — SODIUM CHLORIDE 0.9 % IV BOLUS (SEPSIS)
1000.0000 mL | Freq: Once | INTRAVENOUS | Status: AC
  Administered 2011-05-21: 1000 mL via INTRAVENOUS

## 2011-05-21 NOTE — ED Notes (Signed)
Patient transported to CT 

## 2011-05-21 NOTE — ED Notes (Signed)
Two attempts to start IV. Veins blew both times. EMS attempted twice to start IV, unsuccessfully. PCP office attempt to start IV, unsuccessfully several times.

## 2011-05-21 NOTE — ED Notes (Signed)
Patients urine sample has been collected and placed on the sink in room.

## 2011-05-21 NOTE — ED Provider Notes (Signed)
History     CSN: [id number] Arrival date & time: 05/21/2011  3:59 PM   First MD Initiated Contact with Patient 05/21/11 1608      Chief Complaint  Patient presents with  . Chest Pain  . Shortness of Breath    (Consider location/radiation/quality/duration/timing/severity/associated sxs/prior treatment) HPI   Pt reports feeling SOB today, developed sudden CP at 3:00 today. Pt reports being diaphoretic, feeling SOB. Denies any n/v. Pt went to PCP, they called EMS from office. Pt has hx of rotator cuff surgery 2 weeks ago.   Past Medical History  Diagnosis Date  . Chest discomfort  05/03/2011   . Obstructive sleep apnea      continuous positive airway pressure  . Hyperlipidemia   . Musculoskeletal pain     in the right shoulder from his shoulder disease that eventually need surgery  . Supraventricular tachycardia   . Laceration  01/11/2009     Deep laceration left index finger dorsally  . Inguinal hernia   . Kidney stones   . Diverticulosis   . Carotid stenosis   . Exogenous obesity   . Nasal congestion   . Tachyarrhythmia   . Thyroiditis   . Diverticulitis   . Benign prostatic hypertrophy     Past Surgical History  Procedure Date  . Inguinal hernia repair   . Kidney stone surgery   . Tonsillectomy   . Colonoscopy     for polyps  . Rotator cuff repair     Family History  Problem Relation Age of Onset  . Stroke Mother   . Heart disease Mother     Valvular heart disease    History  Substance Use Topics  . Smoking status: Former Games developer  . Smokeless tobacco: Not on file  . Alcohol Use: No      Review of Systems  Constitutional: Negative for activity change and appetite change.  HENT: Negative.   Respiratory: Positive for chest tightness and shortness of breath.   Genitourinary: Negative.   Musculoskeletal: Negative.   Skin: Negative.   Neurological: Negative.   Hematological: Negative.   Psychiatric/Behavioral: Negative.   All other systems  reviewed and are negative.    Allergies  Penicillins  Home Medications   Current Outpatient Rx  Name Route Sig Dispense Refill  . ASPIRIN 325 MG PO TABS Oral Take 325 mg by mouth daily. Half a tablet daily    . VITAMIN D 1000 UNITS PO TABS Oral Take 1,000 Units by mouth daily.     Marland Kitchen COENZYME Q10 30 MG PO CAPS Oral Take 30 mg by mouth daily.     . OMEGA-3 FATTY ACIDS 1000 MG PO CAPS Oral Take 1 g by mouth 2 (two) times daily.     Marland Kitchen FOLIC ACID 400 MCG PO TABS Oral Take 400 mcg by mouth daily.     Marland Kitchen MELATONIN 10 MG PO TABS Oral Take 10 mg by mouth daily.     Marland Kitchen METHOCARBAMOL 500 MG PO TABS Oral Take 1 tablet (500 mg total) by mouth 4 (four) times daily as needed (SPASMS). 60 tablet 1  . METOPROLOL SUCCINATE 50 MG PO TB24 Oral Take 25 mg by mouth 2 (two) times daily.      Marland Kitchen NITROSTAT 0.4 MG SL SUBL Sublingual Place 0.4 mg under the tongue every 5 (five) minutes as needed. Chest pains    . OMEPRAZOLE 20 MG PO CPDR Oral Take 20 mg by mouth 2 (two) times daily.     Marland Kitchen  OXYCODONE-ACETAMINOPHEN 5-325 MG PO TABS Oral Take 1 tablet by mouth every 4 (four) hours as needed for pain. 60 tablet 0  . OXYMETAZOLINE HCL 0.05 % NA SOLN Nasal Place 2 sprays into the nose as needed. Nasal congestion    . SIMVASTATIN 20 MG PO TABS Oral Take 20 mg by mouth at bedtime.     Marland Kitchen CARAFATE PO Oral Take 1 g by mouth 4 (four) times daily.     Marland Kitchen TAMSULOSIN HCL 0.4 MG PO CAPS Oral Take 0.4 mg by mouth daily.       BP 141/80  Pulse 65  Temp(Src) 98.2 F (36.8 C) (Oral)  Resp 18  Ht 6\' 1"  (1.854 m)  Wt 260 lb (117.935 kg)  BMI 34.30 kg/m2  SpO2 100%  Physical Exam  Nursing note and vitals reviewed. Constitutional: He is oriented to person, place, and time. He appears well-developed and well-nourished. No distress.  HENT:  Head: Normocephalic and atraumatic.  Eyes: Pupils are equal, round, and reactive to light.  Neck: Normal range of motion.  Cardiovascular: Normal rate and intact distal pulses.  Exam reveals  no gallop and no friction rub.   No murmur heard.        Date: 05/21/2011  Rate: 63  Rhythm: normal sinus rhythm  QRS Axis: normal  Intervals: normal  ST/T Wave abnormalities: normal  Conduction Disutrbances:none:   Old EKG Reviewed: none available     Pulmonary/Chest: Effort normal. No respiratory distress. He has no wheezes. He has no rales. He exhibits tenderness.  Abdominal: Normal appearance. He exhibits no distension. There is no tenderness.  Musculoskeletal: Normal range of motion.  Neurological: He is alert and oriented to person, place, and time. No cranial nerve deficit.  Skin: Skin is warm and dry. No rash noted.  Psychiatric: He has a normal mood and affect. His behavior is normal.    Patient states that he had a recent nuclear medicine stress test that was negative  ED Course  Procedures (including critical care time)   Labs Reviewed  CBC  DIFFERENTIAL  PROTIME-INR  POCT I-STAT, CHEM 8  POCT I-STAT TROPONIN I  LAB REPORT - SCANNED   Dg Chest 2 View  05/21/2011  *RADIOLOGY REPORT*  Clinical Data: Shortness of breath, left chest pain, dizziness, night sweats, nausea, former smoker  CHEST - 2 VIEW  Comparison: 05/03/2011  Findings: Normal heart size, mediastinal contours, and pulmonary vascularity. Minimal atelectasis right base. Minimal scarring left base. Remaining lungs clear. No pleural effusion or pneumothorax. No acute osseous findings.  IMPRESSION: Minimal right base atelectasis and left base scarring. No acute infiltrate.  Original Report Authenticated By: Lollie Marrow, M.D.   Ct Angio Chest W/cm &/or Wo Cm  05/21/2011  *RADIOLOGY REPORT*  Clinical Data:  Shortness of breath since shoulder surgery 4 days ago  CT ANGIOGRAPHY CHEST WITH CONTRAST  Technique:  Multidetector CT imaging of the chest was performed using the standard protocol during bolus administration of intravenous contrast.  Multiplanar CT image reconstructions including MIPs were obtained to  evaluate the vascular anatomy.  Contrast: OMNIPAQUE IOHEXOL 350 MG/ML IV SOLN  Comparison:  None  Findings: Scattered streak artifacts question related to patient's arms, particularly at the lower lobes and upper abdomen. Low attenuation lesion right lobe liver 2.6 x 2.2 cm question cyst. Aorta normal caliber without aneurysm or dissection. Minimal scattered atherosclerotic calcification aorta and coronary arteries. Scattered respiratory motion artifacts degrade assessment of lower lobe pulmonary arterial branches. Within these limitations,  no gross evidence of pulmonary embolism identified.  No thoracic adenopathy. Scattered areas of atelectasis at left lung base. Remaining lungs clear. No pleural effusion or pneumothorax. No acute osseous findings.  Review of the MIP images confirms the above findings.  IMPRESSION: No definite evidence of pulmonary embolism identified on exam limited by motion artifacts at the lower lobes and streak artifacts from likely the patient's arms. Probable right lobe hepatic cyst. Minimal bibasilar atelectasis.  Original Report Authenticated By: Lollie Marrow, M.D.     1. Dyspnea   2. Atelectasis       MDM           Nelia Shi, MD 05/22/11 [phone]

## 2011-05-21 NOTE — ED Notes (Signed)
Pt states he had rotator cuff sx on Fri. Was D/cd home from hospital Saturday. Last night had chills and SOB. Today with intermittent chest pains. Sent here by PMD for r/o PE

## 2011-05-21 NOTE — ED Notes (Signed)
Pt reports feeling SOB today, developed sudden CP at 3:00 today. Pt reports being diaphoretic, feeling SOB. Denies any n/v. Pt went to PCP, they called EMS from office. Pt has hx of rotator cuff surgery 2 weeks ago.

## 2011-05-21 NOTE — ED Notes (Signed)
At this time pt alert and oriented. NAD. Vitals stable. Pt reports feeling som SOB, denies any CP. Skin warm and dry.

## 2011-05-21 NOTE — ED Notes (Signed)
IV team at bedside 

## 2011-05-21 NOTE — ED Notes (Signed)
Patient transported to X-ray 

## 2011-05-21 NOTE — ED Notes (Signed)
IV team called to start IV.   

## 2011-05-27 ENCOUNTER — Ambulatory Visit: Payer: Medicare Other | Admitting: Physical Therapy

## 2011-05-30 ENCOUNTER — Ambulatory Visit: Payer: Medicare Other | Admitting: *Deleted

## 2011-06-03 ENCOUNTER — Ambulatory Visit: Payer: Medicare Other | Attending: Orthopedic Surgery | Admitting: Physical Therapy

## 2011-06-03 DIAGNOSIS — M25519 Pain in unspecified shoulder: Secondary | ICD-10-CM | POA: Insufficient documentation

## 2011-06-03 DIAGNOSIS — M25619 Stiffness of unspecified shoulder, not elsewhere classified: Secondary | ICD-10-CM | POA: Insufficient documentation

## 2011-06-03 DIAGNOSIS — IMO0001 Reserved for inherently not codable concepts without codable children: Secondary | ICD-10-CM | POA: Insufficient documentation

## 2011-06-03 DIAGNOSIS — R5381 Other malaise: Secondary | ICD-10-CM | POA: Insufficient documentation

## 2011-06-06 ENCOUNTER — Ambulatory Visit: Payer: Medicare Other | Admitting: Physical Therapy

## 2011-06-10 ENCOUNTER — Encounter (INDEPENDENT_AMBULATORY_CARE_PROVIDER_SITE_OTHER): Payer: Medicare Other | Admitting: *Deleted

## 2011-06-10 DIAGNOSIS — Z01818 Encounter for other preprocedural examination: Secondary | ICD-10-CM

## 2011-06-10 DIAGNOSIS — I6529 Occlusion and stenosis of unspecified carotid artery: Secondary | ICD-10-CM

## 2011-06-11 ENCOUNTER — Encounter: Payer: Medicare Other | Admitting: Physical Therapy

## 2011-06-11 ENCOUNTER — Telehealth: Payer: Self-pay | Admitting: Internal Medicine

## 2011-06-11 DIAGNOSIS — G4733 Obstructive sleep apnea (adult) (pediatric): Secondary | ICD-10-CM

## 2011-06-11 NOTE — Telephone Encounter (Signed)
Layne's Beverely Pace, RRT.  Needs replacement CPAP auto, humidifier Will also need f/u ov.

## 2011-06-13 ENCOUNTER — Ambulatory Visit: Payer: Medicare Other | Admitting: *Deleted

## 2011-06-17 ENCOUNTER — Encounter: Payer: Medicare Other | Admitting: Physical Therapy

## 2011-06-17 ENCOUNTER — Ambulatory Visit: Payer: Medicare Other | Admitting: Physical Therapy

## 2011-06-20 ENCOUNTER — Ambulatory Visit: Payer: Medicare Other | Admitting: Physical Therapy

## 2011-06-27 ENCOUNTER — Ambulatory Visit: Payer: Medicare Other | Admitting: Physical Therapy

## 2011-07-04 ENCOUNTER — Ambulatory Visit: Payer: Medicare Other | Attending: Orthopedic Surgery | Admitting: *Deleted

## 2011-07-04 DIAGNOSIS — R5381 Other malaise: Secondary | ICD-10-CM | POA: Insufficient documentation

## 2011-07-04 DIAGNOSIS — M25619 Stiffness of unspecified shoulder, not elsewhere classified: Secondary | ICD-10-CM | POA: Insufficient documentation

## 2011-07-04 DIAGNOSIS — IMO0001 Reserved for inherently not codable concepts without codable children: Secondary | ICD-10-CM | POA: Insufficient documentation

## 2011-07-04 DIAGNOSIS — M25519 Pain in unspecified shoulder: Secondary | ICD-10-CM | POA: Insufficient documentation

## 2011-07-09 ENCOUNTER — Ambulatory Visit: Payer: Medicare Other | Admitting: Physical Therapy

## 2011-07-11 ENCOUNTER — Encounter: Payer: Medicare Other | Admitting: Physical Therapy

## 2011-07-12 ENCOUNTER — Ambulatory Visit: Payer: Medicare Other | Admitting: Physical Therapy

## 2011-07-16 ENCOUNTER — Ambulatory Visit: Payer: Medicare Other | Admitting: *Deleted

## 2011-07-16 ENCOUNTER — Encounter: Payer: Self-pay | Admitting: Cardiology

## 2011-07-19 ENCOUNTER — Ambulatory Visit: Payer: Medicare Other | Admitting: Physical Therapy

## 2011-07-23 ENCOUNTER — Ambulatory Visit: Payer: Medicare Other | Admitting: *Deleted

## 2011-07-26 ENCOUNTER — Ambulatory Visit: Payer: Medicare Other | Admitting: *Deleted

## 2011-07-29 ENCOUNTER — Ambulatory Visit: Payer: Medicare Other | Admitting: Physical Therapy

## 2011-07-31 ENCOUNTER — Encounter: Payer: Medicare Other | Admitting: Physical Therapy

## 2011-08-08 ENCOUNTER — Ambulatory Visit: Payer: Medicare Other | Attending: Orthopedic Surgery | Admitting: Physical Therapy

## 2011-08-08 DIAGNOSIS — R5381 Other malaise: Secondary | ICD-10-CM | POA: Insufficient documentation

## 2011-08-08 DIAGNOSIS — IMO0001 Reserved for inherently not codable concepts without codable children: Secondary | ICD-10-CM | POA: Insufficient documentation

## 2011-08-08 DIAGNOSIS — M25619 Stiffness of unspecified shoulder, not elsewhere classified: Secondary | ICD-10-CM | POA: Insufficient documentation

## 2011-08-08 DIAGNOSIS — M25519 Pain in unspecified shoulder: Secondary | ICD-10-CM | POA: Insufficient documentation

## 2011-08-12 ENCOUNTER — Encounter: Payer: Self-pay | Admitting: Cardiology

## 2011-08-12 ENCOUNTER — Ambulatory Visit (INDEPENDENT_AMBULATORY_CARE_PROVIDER_SITE_OTHER): Payer: Medicare Other | Admitting: Cardiology

## 2011-08-12 DIAGNOSIS — I498 Other specified cardiac arrhythmias: Secondary | ICD-10-CM

## 2011-08-12 DIAGNOSIS — I6529 Occlusion and stenosis of unspecified carotid artery: Secondary | ICD-10-CM

## 2011-08-12 DIAGNOSIS — R079 Chest pain, unspecified: Secondary | ICD-10-CM

## 2011-08-12 DIAGNOSIS — E669 Obesity, unspecified: Secondary | ICD-10-CM

## 2011-08-12 NOTE — Assessment & Plan Note (Signed)
The patient understands the need to lose weight with diet and exercise. We have discussed specific strategies for this.  

## 2011-08-12 NOTE — Assessment & Plan Note (Signed)
Left carotid Dopplers Dec 2012 showed stable mild carotid artery disease bilaterally with 0-39% bilateral ICA stenosis. We'll repeat in 12/14.

## 2011-08-12 NOTE — Progress Notes (Signed)
HPI The patient returns for follow up of HTN, chest pain and SVT.  Since I last saw her him he has done well.  He had arthroscopic shoulder surgery. Prior to this he did have an appointment in our office had a stress perfusion study in December that demonstrated an EF of 55% with no evidence of ischemia or infarct.  The patient denies any new symptoms such as chest discomfort, neck or arm discomfort. There has been no new shortness of breath, PND or orthopnea. There have been no reported palpitations, presyncope or syncope.  He does some walking for exercise.  Allergies  Allergen Reactions  . Penicillins Rash    Current Outpatient Prescriptions  Medication Sig Dispense Refill  . aspirin 325 MG tablet Take 325 mg by mouth daily. Half a tablet daily      . cholecalciferol (VITAMIN D) 1000 UNITS tablet Take 1,000 Units by mouth daily.       Marland Kitchen co-enzyme Q-10 30 MG capsule Take 30 mg by mouth daily.       . fish oil-omega-3 fatty acids 1000 MG capsule Take 1 g by mouth 2 (two) times daily.       . folic acid (FOLVITE) 400 MCG tablet Take 400 mcg by mouth daily.       . Melatonin 10 MG TABS Take 10 mg by mouth daily.       . metoprolol (TOPROL-XL) 50 MG 24 hr tablet Take 25 mg by mouth 2 (two) times daily.        Marland Kitchen NITROSTAT 0.4 MG SL tablet Place 0.4 mg under the tongue every 5 (five) minutes as needed. Chest pains      . omeprazole (PRILOSEC) 20 MG capsule Take 20 mg by mouth 2 (two) times daily.       Marland Kitchen oxymetazoline (AFRIN) 0.05 % nasal spray Place 2 sprays into the nose as needed. Nasal congestion      . simvastatin (ZOCOR) 20 MG tablet Take 20 mg by mouth at bedtime.       . Sucralfate (CARAFATE PO) Take 1 g by mouth 4 (four) times daily.       . Tamsulosin HCl (FLOMAX) 0.4 MG CAPS Take 0.4 mg by mouth daily.         Past Medical History  Diagnosis Date  . Chest discomfort  05/03/2011   . Obstructive sleep apnea      continuous positive airway pressure  . Hyperlipidemia   .  Musculoskeletal pain     in the right shoulder from his shoulder disease that eventually need surgery  . Supraventricular tachycardia   . Laceration  01/11/2009     Deep laceration left index finger dorsally  . Inguinal hernia   . Kidney stones   . Diverticulosis   . Carotid stenosis   . Exogenous obesity   . Nasal congestion   . Tachyarrhythmia   . Thyroiditis   . Diverticulitis   . Benign prostatic hypertrophy     Past Surgical History  Procedure Date  . Inguinal hernia repair   . Kidney stone surgery   . Tonsillectomy   . Colonoscopy     for polyps  . Rotator cuff repair     ROS:  As stated in the HPI and negative for all other systems.  PHYSICAL EXAM BP 119/70  Pulse 78  Ht 6\' 1"  (1.854 m)  Wt 266 lb (120.657 kg)  BMI 35.09 kg/m2 GENERAL:  Well appearing HEENT:  Pupils equal round  and reactive, fundi not visualized, oral mucosa unremarkable NECK:  No jugular venous distention, waveform within normal limits, carotid upstroke brisk and symmetric, no bruits, no thyromegaly LYMPHATICS:  No cervical, inguinal adenopathy LUNGS:  Clear to auscultation bilaterally BACK:  No CVA tenderness CHEST:  Unremarkable HEART:  PMI not displaced or sustained,S1 and S2 within normal limits, no S3, no S4, no clicks, no rubs, no murmurs ABD:  Flat, positive bowel sounds normal in frequency in pitch, no bruits, no rebound, no guarding, no midline pulsatile mass, no hepatomegaly, no splenomegaly, obese EXT:  2 plus pulses throughout, no edema, no cyanosis no clubbing SKIN:  No rashes no nodules NEURO:  Cranial nerves II through XII grossly intact, motor grossly intact throughout PSYCH:  Cognitively intact, oriented to person place and time     ASSESSMENT AND PLAN

## 2011-08-12 NOTE — Patient Instructions (Signed)
The current medical regimen is effective;  continue present plan and medications.  Follow up in 1 year with Dr Hochrein.  You will receive a letter in the mail 2 months before you are due.  Please call us when you receive this letter to schedule your follow up appointment.  

## 2011-08-12 NOTE — Assessment & Plan Note (Signed)
He has had no further dysrhythmias. No change in therapy is indicated.

## 2011-08-12 NOTE — Assessment & Plan Note (Signed)
He had a negative stress perfusion study. No further testing is indicated at this point. He will continue with risk reduction.

## 2011-08-20 ENCOUNTER — Other Ambulatory Visit: Payer: Self-pay | Admitting: Cardiology

## 2012-01-21 ENCOUNTER — Encounter: Payer: Self-pay | Admitting: Cardiology

## 2012-02-17 ENCOUNTER — Other Ambulatory Visit: Payer: Self-pay | Admitting: Family Medicine

## 2012-02-17 ENCOUNTER — Ambulatory Visit (HOSPITAL_COMMUNITY)
Admission: RE | Admit: 2012-02-17 | Discharge: 2012-02-17 | Disposition: A | Discharge status: Home / Self Care | Payer: Medicare Other | Source: Ambulatory Visit | Attending: Family Medicine | Admitting: Family Medicine

## 2012-02-17 DIAGNOSIS — R0602 Shortness of breath: Secondary | ICD-10-CM

## 2012-02-17 MED ORDER — IOHEXOL 350 MG/ML SOLN
100.0000 mL | Freq: Once | INTRAVENOUS | Status: AC | PRN
  Administered 2012-02-17: 100 mL via INTRAVENOUS

## 2012-02-18 ENCOUNTER — Ambulatory Visit (INDEPENDENT_AMBULATORY_CARE_PROVIDER_SITE_OTHER): Payer: Medicare Other | Admitting: Cardiology

## 2012-02-18 VITALS — BP 134/80 | HR 50 | Ht 72.0 in | Wt 263.0 lb

## 2012-02-18 DIAGNOSIS — I498 Other specified cardiac arrhythmias: Secondary | ICD-10-CM

## 2012-02-18 MED ORDER — METOPROLOL TARTRATE 50 MG PO TABS: 25.0000 mg | ORAL_TABLET | Freq: Two times a day (BID) | ORAL | Status: DC

## 2012-02-18 NOTE — Progress Notes (Signed)
HPI The patient returns for follow up of HTN and chest pain.  He was added to my schedule because of ongoing chest pain. He says he's been under a lot of stress with his wife. He thinks she's having a nervous breakdown. With this anxiety he developed some chest discomfort about 3-4 days ago. This was left upper chest and nonradiating. He did not have any neck or arm discomfort. It would be somewhat constant. It was not made worse with activities or movements. There was no associated nausea vomiting or diaphoresis. He has not had this kind of discomfort before. He did go to his primary care office and had no acute changes on EKG. He was sent for a CT angiogram which ruled out pulmonary embolism. He actually took one of his wife's lorazepam and thinks this helps his symptoms. Of note he's been able to do some activities such as working in his yard recently without bringing this on. He's had no recent PND or orthopnea. He's not having any palpitations, presyncope or syncope. He has had no cough fevers or chills.  Allergies  Allergen Reactions  . Penicillins Rash    Current Outpatient Prescriptions  Medication Sig Dispense Refill  . aspirin 325 MG tablet Take 325 mg by mouth daily. Half a tablet daily      . cholecalciferol (VITAMIN D) 1000 UNITS tablet Take 1,000 Units by mouth daily.       Marland Kitchen co-enzyme Q-10 30 MG capsule Take 30 mg by mouth daily.       Marland Kitchen KRILL OIL 1000 MG CAPS Take by mouth daily.      . Melatonin 10 MG TABS Take 10 mg by mouth daily.       . metoprolol (LOPRESSOR) 50 MG tablet TAKE ONE-HALF TABLET BY MOUTH TWICE DAILY  30 tablet  6  . NITROSTAT 0.4 MG SL tablet Place 0.4 mg under the tongue every 5 (five) minutes as needed. Chest pains      . omeprazole (PRILOSEC) 20 MG capsule Take 20 mg by mouth 2 (two) times daily.       Marland Kitchen oxymetazoline (AFRIN) 0.05 % nasal spray Place 2 sprays into the nose as needed. Nasal congestion      . simvastatin (ZOCOR) 20 MG tablet Take 20 mg by  mouth at bedtime.       . Sucralfate (CARAFATE PO) Take 1 g by mouth 4 (four) times daily.       . Tamsulosin HCl (FLOMAX) 0.4 MG CAPS Take 0.4 mg by mouth daily.        No current facility-administered medications for this visit.   Facility-Administered Medications Ordered in Other Visits  Medication Dose Route Frequency Provider Last Rate Last Dose  . iohexol (OMNIPAQUE) 350 MG/ML injection 100 mL  100 mL Intravenous Once PRN Medication Radiologist, MD   100 mL at 02/17/12 1824    Past Medical History  Diagnosis Date  . Chest discomfort  05/03/2011   . Obstructive sleep apnea      continuous positive airway pressure  . Hyperlipidemia   . Musculoskeletal pain     in the right shoulder from his shoulder disease that eventually need surgery  . Supraventricular tachycardia   . Laceration  01/11/2009     Deep laceration left index finger dorsally  . Inguinal hernia   . Kidney stones   . Diverticulosis   . Carotid stenosis   . Exogenous obesity   . Nasal congestion   . Tachyarrhythmia   .  Thyroiditis   . Diverticulitis   . Benign prostatic hypertrophy     Past Surgical History  Procedure Date  . Inguinal hernia repair   . Kidney stone surgery   . Tonsillectomy   . Colonoscopy     for polyps  . Rotator cuff repair     ROS:  As stated in the HPI and negative for all other systems.  PHYSICAL EXAM BP 134/80  Pulse 50  Ht 6' (1.829 m)  Wt 263 lb (119.296 kg)  BMI 35.67 kg/m2 GENERAL:  Well appearing HEENT:  Pupils equal round and reactive, fundi not visualized, oral mucosa unremarkable NECK:  No jugular venous distention, waveform within normal limits, carotid upstroke brisk and symmetric, no bruits, no thyromegaly LYMPHATICS:  No cervical, inguinal adenopathy LUNGS:  Clear to auscultation bilaterally BACK:  No CVA tenderness CHEST:  Unremarkable HEART:  PMI not displaced or sustained,S1 and S2 within normal limits, no S3, no S4, no clicks, no rubs, no murmurs ABD:   Flat, positive bowel sounds normal in frequency in pitch, no bruits, no rebound, no guarding, no midline pulsatile mass, no hepatomegaly, no splenomegaly, obese EXT:  2 plus pulses throughout, no edema, no cyanosis no clubbing SKIN:  No rashes no nodules NEURO:  Cranial nerves II through XII grossly intact, motor grossly intact throughout PSYCH:  Cognitively intact, oriented to person place and time    EKG:  Sinus rhythm, rate 59, axis within normal limits, intervals within normal limits, no acute ST-T wave changes. 02/18/2012   ASSESSMENT AND PLAN   CHEST PAIN -  His pain seems to have resolved today. He was somewhat atypical. He had a negative stress perfusion study last fall. At this point I think the pretest probability of obstructive coronary disease as the etiology for his discomfort is low. No further cardiovascular testing is indicated.   Anxiety - I have asked him to discuss this with Dr. Christell Constant. He may need some therapy for this.   SUPRAVENTRICULAR TACHYCARDIA -  He has had no further dysrhythmias. No change in therapy is indicated.   Carotid stenosis - He has mild carotid plaque was last visualized in December. He will have followup in 2 years.

## 2012-02-18 NOTE — Patient Instructions (Addendum)
The current medical regimen is effective;  continue present plan and medications.  Follow up as needed with Dr Hochrein 

## 2012-02-19 ENCOUNTER — Encounter: Payer: Self-pay | Admitting: Cardiology

## 2012-02-28 ENCOUNTER — Other Ambulatory Visit: Payer: Self-pay | Admitting: Family Medicine

## 2012-02-28 DIAGNOSIS — K769 Liver disease, unspecified: Secondary | ICD-10-CM

## 2012-03-03 ENCOUNTER — Ambulatory Visit
Admission: RE | Admit: 2012-03-03 | Discharge: 2012-03-03 | Disposition: A | Discharge status: Home / Self Care | Payer: Medicare Other | Source: Ambulatory Visit | Attending: Family Medicine | Admitting: Family Medicine

## 2012-03-03 DIAGNOSIS — K769 Liver disease, unspecified: Secondary | ICD-10-CM

## 2012-03-05 ENCOUNTER — Ambulatory Visit
Admission: RE | Admit: 2012-03-05 | Discharge: 2012-03-05 | Disposition: A | Discharge status: Home / Self Care | Payer: Medicare Other | Source: Ambulatory Visit | Attending: Family Medicine | Admitting: Family Medicine

## 2012-03-05 MED ORDER — GADOBENATE DIMEGLUMINE 529 MG/ML IV SOLN
20.0000 mL | Freq: Once | INTRAVENOUS | Status: AC | PRN
  Administered 2012-03-05: 20 mL via INTRAVENOUS

## 2012-04-23 ENCOUNTER — Encounter: Payer: Self-pay | Admitting: Cardiology

## 2012-04-28 ENCOUNTER — Telehealth: Payer: Self-pay | Admitting: Internal Medicine

## 2012-04-28 ENCOUNTER — Other Ambulatory Visit: Payer: Self-pay | Admitting: Family Medicine

## 2012-04-28 ENCOUNTER — Ambulatory Visit (HOSPITAL_COMMUNITY)
Admission: RE | Admit: 2012-04-28 | Discharge: 2012-04-28 | Disposition: A | Discharge status: Home / Self Care | Payer: Medicare Other | Source: Ambulatory Visit | Attending: Family Medicine | Admitting: Family Medicine

## 2012-04-28 DIAGNOSIS — R109 Unspecified abdominal pain: Secondary | ICD-10-CM

## 2012-04-28 DIAGNOSIS — K7689 Other specified diseases of liver: Secondary | ICD-10-CM | POA: Insufficient documentation

## 2012-04-28 DIAGNOSIS — N200 Calculus of kidney: Secondary | ICD-10-CM | POA: Insufficient documentation

## 2012-04-28 DIAGNOSIS — R933 Abnormal findings on diagnostic imaging of other parts of digestive tract: Secondary | ICD-10-CM | POA: Insufficient documentation

## 2012-04-28 MED ORDER — IOHEXOL 300 MG/ML  SOLN
100.0000 mL | Freq: Once | INTRAMUSCULAR | Status: AC | PRN
  Administered 2012-04-28: 100 mL via INTRAVENOUS

## 2012-04-28 NOTE — Telephone Encounter (Signed)
Called by Dr. Christell Constant re: microperforation and diverticulitis on CT. I agreed with oral antibiotics and outpatient therapy as long as patient responds.

## 2012-05-11 ENCOUNTER — Encounter: Payer: Self-pay | Admitting: Internal Medicine

## 2012-05-15 ENCOUNTER — Telehealth: Payer: Self-pay | Admitting: Internal Medicine

## 2012-05-15 NOTE — Telephone Encounter (Signed)
Received 1 page of labs from [facility] sent up for Dr. Leone Payor to review  05/15/2012 asw

## 2012-06-01 ENCOUNTER — Telehealth: Payer: Self-pay | Admitting: Internal Medicine

## 2012-06-01 NOTE — Telephone Encounter (Signed)
Patient had diverticulitis with microperforation on 04/28/12, he is still having abdominal pain.  Dr. Christell Constant has had the patient resume cipro and flagyl.  He is scheduled to see Willette Cluster RNP tomorrow at 10:30

## 2012-06-02 ENCOUNTER — Encounter: Payer: Self-pay | Admitting: Nurse Practitioner

## 2012-06-02 ENCOUNTER — Ambulatory Visit (INDEPENDENT_AMBULATORY_CARE_PROVIDER_SITE_OTHER): Payer: Non-veteran care | Admitting: Nurse Practitioner

## 2012-06-02 VITALS — BP 132/88 | HR 90 | Ht 73.0 in | Wt 257.0 lb

## 2012-06-02 DIAGNOSIS — K5792 Diverticulitis of intestine, part unspecified, without perforation or abscess without bleeding: Secondary | ICD-10-CM | POA: Insufficient documentation

## 2012-06-02 DIAGNOSIS — K5732 Diverticulitis of large intestine without perforation or abscess without bleeding: Secondary | ICD-10-CM

## 2012-06-02 NOTE — Progress Notes (Signed)
06/02/2012 Cristian Moore [id number] 30-Apr-1941   History of Present Illness:  Patient is a 71 year old male known to Dr. Leone Payor. He has a history of adenomatous colon polyps, last surveillance colonoscopy November 2011. Findings included severe diverticulosis of the sigmoid colon, internal hemorrhoids, exam was otherwise normal. Patient gives a history of diverticulitis for which he is worked in today. A CT scan late October was compatible with acute inflammatory changes involving the proximal sigmoid colon. Patient was treated by PCP. Symptoms resolved but recurred a few days later. PCP prescribed a second round of antibiotics. Once again, symptoms resolved but recurred a few days ago . Patient is back on antibiotics and feeling better. No fevers or chills.  There is a CBC dated 05/11/12 scanned into the computer. White count was normal, hemoglobin normal.   Current Medications, Allergies, Past Medical History, Past Surgical History, Family History and Social History were reviewed in [employer] record.   Physical Exam: General: Well developed , white male in no acute distress Head: Normocephalic and atraumatic Eyes:  sclerae anicteric, conjunctiva pink  Ears: Normal auditory acuity Lungs: Clear throughout to auscultation Heart: Regular rate and rhythm Abdomen: Soft,  non distended, mild left lower quadrant tenderness. No masses, no hepatomegaly. Normal bowel sounds Musculoskeletal: Symmetrical with no gross deformities  Extremities: No edema  Neurological: Alert oriented x 4, grossly nonfocal Psychological:  Alert and cooperative. Normal mood and affect  Assessment and Recommendations: 71 year old male with recurrent diverticulitis (patient has history of diverticulitis last year). His recent episode was documented by CT scan 04/28/12. Patient on third round of antibiotics since CT scan as he gets recurrent pain within a few days of completing antibiotics. Patient  needs a surgical evaluation, will refer to [facility]. Patient is feeling better, abdominal exam not overly concerning. Until surgical evaluation I recommended completion of antibiotics and low residue diet

## 2012-06-02 NOTE — Patient Instructions (Addendum)
You have been scheduled for an appointment with Dr. Karie Soda at [facility]. Your appointment is on 06/10/12 at 11:00am. Please arrive at 10:30am for registration. Make certain to bring a list of current medications, including any over the counter medications or vitamins. Also bring your co-pay if you have one as well as your insurance cards. [facility] is located at [address], [address]. Should you need to reschedule your appointment, please contact them at [phone].  Please finish the antibiotics your currently on.  Today we have given you a handout on low fiber diet information to read and follow.  Thank you for choosing [facility].

## 2012-06-03 NOTE — Progress Notes (Signed)
Discussed and agree with the plans.

## 2012-06-10 ENCOUNTER — Ambulatory Visit (INDEPENDENT_AMBULATORY_CARE_PROVIDER_SITE_OTHER): Payer: Medicare Other | Admitting: Surgery

## 2012-06-10 ENCOUNTER — Encounter (INDEPENDENT_AMBULATORY_CARE_PROVIDER_SITE_OTHER): Payer: Self-pay | Admitting: Surgery

## 2012-06-10 VITALS — BP 118/66 | HR 74 | Temp 97.8°F | Resp 18 | Ht 73.0 in | Wt 254.5 lb

## 2012-06-10 DIAGNOSIS — E669 Obesity, unspecified: Secondary | ICD-10-CM

## 2012-06-10 DIAGNOSIS — K5792 Diverticulitis of intestine, part unspecified, without perforation or abscess without bleeding: Secondary | ICD-10-CM

## 2012-06-10 DIAGNOSIS — K5732 Diverticulitis of large intestine without perforation or abscess without bleeding: Secondary | ICD-10-CM

## 2012-06-10 MED ORDER — METRONIDAZOLE 500 MG PO TABS: 500.0000 mg | ORAL_TABLET | Freq: Three times a day (TID) | ORAL | Status: DC

## 2012-06-10 MED ORDER — CIPROFLOXACIN HCL 750 MG PO TABS: 750.0000 mg | ORAL_TABLET | Freq: Two times a day (BID) | ORAL | Status: DC

## 2012-06-10 NOTE — Patient Instructions (Signed)
See the Handout(s) we gave you.  Consider surgery.  Please call our office at [phone] if you wish to schedule surgery or if you have further questions / concerns.   Diverticulitis A diverticulum is a small pouch or sac on the colon. Diverticulosis is the presence of these diverticula on the colon. Diverticulitis is the irritation (inflammation) or infection of diverticula. CAUSES  The colon and its diverticula contain bacteria. If food particles block the tiny opening to a diverticulum, the bacteria inside can grow and cause an increase in pressure. This leads to infection and inflammation and is called diverticulitis. SYMPTOMS   Abdominal pain and tenderness. Usually, the pain is located on the left side of your abdomen. However, it could be located elsewhere.  Fever.  Bloating.  Feeling sick to your stomach (nausea).  Throwing up (vomiting).  Abnormal stools. DIAGNOSIS  Your caregiver will take a history and perform a physical exam. Since many things can cause abdominal pain, other tests may be necessary. Tests may include:  Blood tests.  Urine tests.  X-ray of the abdomen.  CT scan of the abdomen. Sometimes, surgery is needed to determine if diverticulitis or other conditions are causing your symptoms. TREATMENT  Most of the time, you can be treated without surgery. Treatment includes:  Resting the bowels by only having liquids for a few days. As you improve, you will need to eat a low-fiber diet.  Intravenous (IV) fluids if you are losing body fluids (dehydrated).  Antibiotic medicines that treat infections may be given.  Pain and nausea medicine, if needed.  Surgery if the inflamed diverticulum has burst. HOME CARE INSTRUCTIONS   Try a clear liquid diet (broth, tea, or water for as long as directed by your caregiver). You may then gradually begin a low-fiber diet as tolerated. A low-fiber diet is a diet with less than 10 grams of fiber. Choose the foods below  to reduce fiber in the diet:  White breads, cereals, rice, and pasta.  Cooked fruits and vegetables or soft fresh fruits and vegetables without the skin.  Ground or well-cooked tender beef, ham, veal, lamb, pork, or poultry.  Eggs and seafood.  After your diverticulitis symptoms have improved, your caregiver may put you on a high-fiber diet. A high-fiber diet includes 14 grams of fiber for every 1000 calories consumed. For a standard 2000 calorie diet, you would need 28 grams of fiber. Follow these diet guidelines to help you increase the fiber in your diet. It is important to slowly increase the amount fiber in your diet to avoid gas, constipation, and bloating.  Choose whole-grain breads, cereals, pasta, and brown rice.  Choose fresh fruits and vegetables with the skin on. Do not overcook vegetables because the more vegetables are cooked, the more fiber is lost.  Choose more nuts, seeds, legumes, dried peas, beans, and lentils.  Look for food products that have greater than 3 grams of fiber per serving on the Nutrition Facts label.  Take all medicine as directed by your caregiver.  If your caregiver has given you a follow-up appointment, it is very important that you go. Not going could result in lasting (chronic) or permanent injury, pain, and disability. If there is any problem keeping the appointment, call to reschedule. SEEK MEDICAL CARE IF:   Your pain does not improve.  You have a hard time advancing your diet beyond clear liquids.  Your bowel movements do not return to normal. SEEK IMMEDIATE MEDICAL CARE IF:   Your  pain becomes worse.  You have an oral temperature above 102 F (38.9 C), not controlled by medicine.  You have repeated vomiting.  You have bloody or black, tarry stools.  Symptoms that brought you to your caregiver become worse or are not getting better. MAKE SURE YOU:   Understand these instructions.  Will watch your condition.  Will get help  right away if you are not doing well or get worse. Document Released: 03/27/2005 Document Revised: 09/09/2011 Document Reviewed: 07/23/2010 [facility] Patient Information 2013 [place], [place].  Low-Fiber Diet Fiber is found in fruits, vegetables, and grains. A low-fiber diet restricts fibrous foods that are not digested in the small intestine. A diet containing about 10 grams of fiber is considered low fiber.  PURPOSE  To prevent blockage of a partially obstructed or narrowed gastrointestinal tract.  To reduce fecal weight and volume.  To slow the movement of feces. WHEN IS THIS DIET USED?  It may be used during the acute phase of Crohn disease, ulcerative colitis, regional enteritis, or diverticulitis.  It may be used if your intestinal or esophageal tubes are narrowing (stenosis).  It may be used as a transitional diet following surgery, injury (trauma), or illness. CHOOSING FOODS Check labels, especially on foods from the starch list. Often times, dietary fiber content is listed on the nutrition facts panel. Please ask your Registered Dietitian if you have questions about specific foods that are related to your condition, especially if the food is not listed on this handout. Breads and Starches  Allowed: White, [place], and pita breads, plain rolls, buns, or sweet rolls, doughnuts, waffles, pancakes, bagels. Plain muffins, biscuits, matzoth. Soda, saltine, graham crackers. Pretzels, rusks, melba toast, zwieback. Cooked cereals: cornmeal, farina, or cream cereals. Dry cereals: refined corn, wheat, rice, and oat cereals (check label). Potatoes prepared any way without skins, refined macaroni, spaghetti, noodles, refined rice.  Avoid: Whole-wheat bread, rolls, and crackers. Multigrains, rye, bran seeds, nuts, or coconut. Cereals containing whole grains, multigrains, bran, coconut, nuts, raisins. Cooked or dry oatmeal. Coarse wheat cereals, granola. Cereals advertised as "high fiber." Potato  skins. Whole-grain pasta, wild or brown rice. Popcorn. Vegetables  Allowed: Strained tomato and vegetable juices. Fresh lettuce, cucumber, spinach. Well-cooked or canned: asparagus, bean sprouts, broccoli, cut green beans, cauliflower, pumpkin, beets, mushrooms, olives, yellow squash, tomato, tomato sauce, zucchini, turnips.Keep servings limited to  cup.  Avoid: Fresh, cooked, or canned: artichokes, baked beans, beet greens, Brussels sprouts, corn, kale, legumes, peas, sweet potatoes. Avoid large servings of any vegetables. Fruit  Allowed: All fruit juices except prune juice. Cooked or canned fruits without skin and seeds: apricots, applesauce, cantaloupe, cherries, grapefruit, grapes, kiwi, mandarin oranges, peaches, pears, fruit cocktail, pineapple, plums, watermelon. Fresh without skin: banana, grapes, cantaloupe, avocado, cherries, pineapple, kiwi, nectarines, peaches, blueberries. Keep servings limited to  cup or 1 piece.  Avoid: Fresh: apples with or without skin, apricots, mangoes, pears, raspberries, strawberries. Prune juice and juices with pulp, stewed or dried prunes. Dried fruits, raisins, dates. Avoid large servings of all fresh fruits. Meat and Protein Substitutes  Allowed: Ground or well-cooked tender beef, ham, veal, lamb, pork, poultry. Eggs, plain cheese. Fish, oysters, shrimp, lobster, other seafood. Liver, organ meats. Smooth nut butters.  Avoid: Tough, fibrous meats with gristle. Chunky nut butter.Cheese with seeds, nuts, or other foods not allowed. Nuts, seeds, legumes, dried peas, beans, lentils. Dairy  Allowed: All milk products except those not allowed.  Avoid: Yogurt or cheese that contains nuts, seeds, or added fruit. Soups and  Combination Foods  Allowed: Bouillon, broth, or cream soups made from allowed foods. Any strained soup. Casseroles or mixed dishes made with allowed foods.  Avoid: Soups made from vegetables that are not allowed or that contain other  foods not allowed. Desserts and Sweets  Allowed:Plain cakes and cookies, pie made with allowed fruit, pudding, custard, cream pie. Gelatin, fruit, ice, sherbet, frozen ice pops. Ice cream, ice milk without nuts. Plain hard candy, honey, jelly, molasses, syrup, sugar, chocolate syrup, gumdrops, marshmallows.  Avoid: Desserts, cookies, or candies that contain nuts, peanut butter, dried fruits. Jams, preserves with seeds, marmalade. Fats and Oils  Allowed:Margarine, butter, cream, mayonnaise, salad oils, plain salad dressings made from allowed foods.  Avoid: Seeds, nuts, olives. Beverages  Allowed: All, except those listed to avoid.  Avoid: Fruit juices with high pulp, prune juice. Condiments  Allowed:Ketchup, mustard, horseradish, vinegar, cream sauce, cheese sauce, cocoa powder. Spices in moderation: allspice, basil, bay leaves, celery powder or leaves, cinnamon, cumin powder, curry powder, ginger, mace, marjoram, onion or garlic powder, oregano, paprika, parsley flakes, ground pepper, rosemary, sage, savory, tarragon, thyme, turmeric.  Avoid: Coconut, pickles. SAMPLE MENU Breakfast   cup orange juice.  1 boiled egg.  1 slice white toast.  Margarine.   cup cornflakes.  1 cup milk.  Beverage. Lunch   cup chicken noodle soup.  2 to 3 oz sliced roast beef.  2 slices white bread.  Mayonnaise.   cup tomato juice.  1 small banana.  Beverage. Dinner  3 oz baked chicken.   cup scalloped potatoes.   cup cooked beets.  White dinner roll.  Margarine.   cup canned peaches.  Beverage. Document Released: 12/07/2001 Document Revised: 09/09/2011 Document Reviewed: 07/04/2011 [facility] Patient Information 2013 [place], [place].  ABDOMINAL SURGERY: POST OP INSTRUCTIONS  1. DIET: Follow a light bland diet the first 24 hours after arrival home, such as soup, liquids, crackers, etc.  Be sure to include lots of fluids daily.  Avoid fast food or heavy meals as  your are more likely to get nauseated.  Eat a low fat the next few days after surgery.   2. Take your usually prescribed home medications unless otherwise directed. 3. PAIN CONTROL: a. Pain is best controlled by a usual combination of three different methods TOGETHER: i. Ice/Heat ii. Over the counter pain medication iii. Prescription pain medication b. Most patients will experience some swelling and bruising around the incisions.  Ice packs or heating pads (30-60 minutes up to 6 times a day) will help. Use ice for the first few days to help decrease swelling and bruising, then switch to heat to help relax tight/sore spots and speed recovery.  Some people prefer to use ice alone, heat alone, alternating between ice & heat.  Experiment to what works for you.  Swelling and bruising can take several weeks to resolve.   c. It is helpful to take an over-the-counter pain medication regularly for the first few weeks.  Choose one of the following that works best for you: i. Naproxen (Aleve, etc)  Two 220mg  tabs twice a day ii. Ibuprofen (Advil, etc) Three 200mg  tabs four times a day (every meal & bedtime) iii. Acetaminophen (Tylenol, etc) 500-650mg  four times a day (every meal & bedtime) d. A  prescription for pain medication (such as oxycodone, hydrocodone, etc) should be given to you upon discharge.  Take your pain medication as prescribed.  i. If you are having problems/concerns with the prescription medicine (does not control pain, nausea, vomiting, rash,  itching, etc), please call us [phone] to see if we need to switch you to a different pain medicine that will work better for you and/or control your side effect better. ii. If you need a refill on your pain medication, please contact your pharmacy.  They will contact our office to request authorization. Prescriptions will not be filled after 5 pm or on week-ends. 4. Avoid getting constipated.  Between the surgery and the pain medications, it is  common to experience some constipation.  Increasing fluid intake and taking a fiber supplement (such as Metamucil, Citrucel, FiberCon, MiraLax, etc) 1-2 times a day regularly will usually help prevent this problem from occurring.  A mild laxative (prune juice, Milk of Magnesia, MiraLax, etc) should be taken according to package directions if there are no bowel movements after 48 hours.   5. Watch out for diarrhea.  If you have many loose bowel movements, simplify your diet to bland foods & liquids for a few days.  Stop any stool softeners and decrease your fiber supplement.  Switching to mild anti-diarrheal medications (Kayopectate, Pepto Bismol) can help.  If this worsens or does not improve, please call us. 6. Wash / shower every day.  You may shower over the incision / wound.  Avoid baths until the skin is fully healed.  Continue to shower over incision(s) after the dressing is off. 7. Remove your waterproof bandages 5 days after surgery.  You may leave the incision open to air.  You may replace a dressing/Band-Aid to cover the incision for comfort if you wish. 8. ACTIVITIES as tolerated:   a. You may resume regular (light) daily activities beginning the next day-such as daily self-care, walking, climbing stairs-gradually increasing activities as tolerated.  If you can walk 30 minutes without difficulty, it is safe to try more intense activity such as jogging, treadmill, bicycling, low-impact aerobics, swimming, etc. b. Save the most intensive and strenuous activity for last such as sit-ups, heavy lifting, contact sports, etc  Refrain from any heavy lifting or straining until you are off narcotics for pain control.   c. DO NOT PUSH THROUGH PAIN.  Let pain be your guide: If it hurts to do something, don't do it.  Pain is your body warning you to avoid that activity for another week until the pain goes down. d. You may drive when you are no longer taking prescription pain medication, you can comfortably  wear a seatbelt, and you can safely maneuver your car and apply brakes. e. Bonita Quin may have sexual intercourse when it is comfortable.  9. FOLLOW UP in our office a. Please call [facility] at [phone] to set up an appointment to see your surgeon in the office for a follow-up appointment approximately 1-2 weeks after your surgery. b. Make sure that you call for this appointment the day you arrive home to insure a convenient appointment time. 10. IF YOU HAVE DISABILITY OR FAMILY LEAVE FORMS, BRING THEM TO THE OFFICE FOR PROCESSING.  DO NOT GIVE THEM TO YOUR DOCTOR.   WHEN TO CALL us [phone]: 1. Poor pain control 2. Reactions / problems with new medications (rash/itching, nausea, etc)  3. Fever over 101.5 F (38.5 C) 4. Inability to urinate 5. Nausea and/or vomiting 6. Worsening swelling or bruising 7. Continued bleeding from incision. 8. Increased pain, redness, or drainage from the incision  The clinic staff is available to answer your questions during regular business hours (8:30am-5pm).  Please don't hesitate to call and ask to  speak to one of our nurses for clinical concerns.   A surgeon from [facility] is always on call at the hospitals   If you have a medical emergency, go to the nearest emergency room or call 911.    [facility] [address], [place], [place]  [address] ? MAIN: [phone] ? TOLL FREE: [phone] ? FAX [phone] [email or web address]

## 2012-06-10 NOTE — Progress Notes (Signed)
Subjective:     Patient ID: Cristian Moore, male   DOB: 17-Aug-1940, 71 y.o.   MRN: [id number]  HPI  Cristian Moore  1941/01/09 [id number]  Patient Care Team: Ernestina Penna, MD as PCP - General (Family Medicine) Iva Boop, MD as Consulting Physician (Gastroenterology) [name], MD as Consulting Physician (Cardiology)  This patient is a 71 y.o.male who presents today for surgical evaluation at the request of Dr. Leone Payor.   Reason for evaluation: Recurrent diverticulitis.  Now on chronic antibiotics.  Was not obese male with history of intermittent diverticulitis.  He thinks his first attack was 15 years ago.  History of colon polyps with numerous endoscopies.  Followed by [facility] gastroenterology, and Dr. Kinnie Scales, now back to her gastroenterology.  Had another attack in October.  Left lower quadrant pain and some low-grade fevers.  CT scan confirmed inflammation consistent with diverticulitis.  Was placed on oral antibiotics.  Improved.  Then recurred.  He is now on his third round of 10 day course of Cipro/Flagyl.  Every time he goes off he feels pain, back.  Was seen by gastroenterology.  Given the numerous attacks, he was sent to me for surgical evaluation.  Normally has 2-3 bowel movements a day.  They have been more loose on the oral antibiotics.  Using a probiotic.  Semiretired Visual merchandiser.  Can walk 30 minutes without much difficulty.  Had an episode of what sounds like SVT.  More stable on beta blocker.  Followed by cardiology, Dr. Antoine Poche.  No abdominal surgeries.  Patient Active Problem List  Diagnosis  . DYSLIPIDEMIA  . Obesity (BMI 30-39.9)  . SUPRAVENTRICULAR TACHYCARDIA  . CAROTID ARTERY DISEASE  . HYPERSOMNIA WITH SLEEP APNEA UNSPECIFIED  . CHEST PAIN  . Rotator cuff (capsule) sprain  . Diverticulitis, recurrent    Past Medical History  Diagnosis Date  . Obstructive sleep apnea      continuous positive airway pressure  . Hyperlipidemia   . Musculoskeletal  pain     in the right shoulder from his shoulder disease that eventually need surgery  . Supraventricular tachycardia   . Laceration  01/11/2009     Deep laceration left index finger dorsally  . Inguinal hernia   . Kidney stones   . Diverticulosis   . Carotid stenosis   . Exogenous obesity   . Nasal congestion   . Tachyarrhythmia   . Thyroiditis   . Diverticulitis   . Benign prostatic hypertrophy   . Arthritis   . GERD (gastroesophageal reflux disease)   . Abdominal pain   . Rectal bleeding   . Difficulty urinating     Past Surgical History  Procedure Date  . Inguinal hernia repair   . Kidney stone surgery   . Tonsillectomy   . Colonoscopy     for polyps  . Rotator cuff repair   . Knee surgery   . Elbow bursa surgery     History   Social History  . Marital Status: Married    Spouse Name: N/A    Number of Children: 4  . Years of Education: N/A   Occupational History  . Retired    Social History Main Topics  . Smoking status: Former Smoker    Types: Cigars    Quit date: 08/12/1999  . Smokeless tobacco: Not on file  . Alcohol Use: No  . Drug Use: No  . Sexually Active: Not on file   Other Topics Concern  . Not on file  Social History Narrative  . No narrative on file    Family History  Problem Relation Age of Onset  . Stroke Mother   . Heart disease Mother     Valvular heart disease    Current Outpatient Prescriptions  Medication Sig Dispense Refill  . aspirin 325 MG tablet Take 325 mg by mouth daily. Half a tablet daily      . bifidobacterium infantis (ALIGN) capsule Take 1 capsule by mouth daily.      . cholecalciferol (VITAMIN D) 1000 UNITS tablet Take 1,000 Units by mouth daily.       . ciprofloxacin (CIPRO) 750 MG tablet Take 1 tablet (750 mg total) by mouth 2 (two) times daily.  20 tablet  3  . co-enzyme Q-10 30 MG capsule Take 30 mg by mouth daily.       . fish oil-omega-3 fatty acids 1000 MG capsule Take 2 g by mouth as needed.       .  metoprolol (LOPRESSOR) 50 MG tablet Take 0.5 tablets (25 mg total) by mouth 2 (two) times daily.  90 tablet  3  . metroNIDAZOLE (FLAGYL) 500 MG tablet Take 1 tablet (500 mg total) by mouth 3 (three) times daily.  30 tablet  3  . NITROSTAT 0.4 MG SL tablet Place 0.4 mg under the tongue every 5 (five) minutes as needed. Chest pains      . omeprazole (PRILOSEC) 20 MG capsule Take 20 mg by mouth 2 (two) times daily.       Marland Kitchen oxymetazoline (AFRIN) 0.05 % nasal spray Place 2 sprays into the nose as needed. Nasal congestion      . simvastatin (ZOCOR) 20 MG tablet Take 20 mg by mouth at bedtime.       . Sucralfate (CARAFATE PO) Take 1 g by mouth 2 (two) times daily.       . Tamsulosin HCl (FLOMAX) 0.4 MG CAPS Take 0.4 mg by mouth daily.          Allergies  Allergen Reactions  . Penicillins Rash    BP 118/66  Pulse 74  Temp 97.8 F (36.6 C) (Temporal)  Resp 18  Ht 6\' 1"  (1.854 m)  Wt 254 lb 8 oz (115.44 kg)  BMI 33.58 kg/m2  No results found.   Review of Systems  Constitutional: Negative for fever, chills and diaphoresis.  HENT: Negative for nosebleeds, sore throat, facial swelling, mouth sores, trouble swallowing and ear discharge.   Eyes: Negative for photophobia, discharge and visual disturbance.  Respiratory: Negative for choking, chest tightness, shortness of breath and stridor.   Cardiovascular: Negative for chest pain and palpitations.  Gastrointestinal: Positive for abdominal pain. Negative for nausea, vomiting, constipation, blood in stool, abdominal distention, anal bleeding and rectal pain.  Genitourinary: Negative for dysuria, urgency, difficulty urinating and testicular pain.  Musculoskeletal: Negative for myalgias, back pain, arthralgias and gait problem.  Skin: Negative for color change, pallor, rash and wound.  Neurological: Negative for dizziness, speech difficulty, weakness, numbness and headaches.  Hematological: Negative for adenopathy. Does not bruise/bleed easily.    Psychiatric/Behavioral: Negative for hallucinations, confusion and agitation.       Objective:   Physical Exam  Constitutional: He is oriented to person, place, and time. He appears well-developed and well-nourished. No distress.  HENT:  Head: Normocephalic.  Mouth/Throat: Oropharynx is clear and moist. No oropharyngeal exudate.  Eyes: Conjunctivae normal and EOM are normal. Pupils are equal, round, and reactive to light. No scleral icterus.  Neck: Normal  range of motion. Neck supple. No tracheal deviation present.  Cardiovascular: Normal rate, regular rhythm and intact distal pulses.   Pulmonary/Chest: Effort normal and breath sounds normal. No respiratory distress.  Abdominal: Soft. He exhibits no distension. There is no tenderness. There is no rigidity, no guarding and no CVA tenderness. A hernia is present. Hernia confirmed positive in the ventral area. Hernia confirmed negative in the right inguinal area and confirmed negative in the left inguinal area.    Musculoskeletal: Normal range of motion. He exhibits no tenderness.  Lymphadenopathy:    He has no cervical adenopathy.       Right: No inguinal adenopathy present.       Left: No inguinal adenopathy present.  Neurological: He is alert and oriented to person, place, and time. No cranial nerve deficit. He exhibits normal muscle tone. Coordination normal.  Skin: Skin is warm and dry. No rash noted. He is not diaphoretic. No erythema. No pallor.  Psychiatric: He has a normal mood and affect. His behavior is normal. Judgment and thought content normal.       Assessment:     Recurrent diverticulitis     Plan:     Given the fact that he has had over four attacks and now he has had persistent inflammation requiring constant antibiotics, I think he is at the point of end-stage recurrent diverticulitis towards segmental sigmoid colonic resection.  He and his wife are inclined to agree.  I long discussion with him and discussing the  procedure:  The anatomy & physiology of the digestive tract was discussed.  The pathophysiology was discussed.  Natural history risks without surgery was discussed.   I feel the risks of no intervention will lead to serious problems that outweigh the operative risks; therefore, I recommended a partial colectomy to remove the pathology.  Laparoscopic & open techniques were discussed.   Risks such as bleeding, infection, abscess, leak, reoperation, possible ostomy, hernia, heart attack, death, and other risks were discussed.  I noted a good likelihood this will help address the problem.   Goals of post-operative recovery were discussed as well.  We will work to minimize complications.  An educational handout on the pathology was given as well.  Questions were answered.  The patient expresses understanding & wishes to proceed with surgery.  I am concerned about the health of the patient and the ability to tolerate the operation Given his past history of SVT.  Therefore, we will request clearance by cardiology to better assess operative risk & see if a reevaluation, further workup, adjustment to medications, etc is needed.  Hopefully all he needs is a letter from Dr. Antoine Poche since he has seen the patient a few times this year and things have been stable.  Nonetheless, I want to be safe...  He has a small hernia in his bellybutton.  Probably can just be primarily repaired.  We will plan for placement through the hernia.  Avoid mesh given the fact that doing a colectomy.  The anatomy & physiology of the abdominal wall was discussed.  The pathophysiology of hernias was discussed.  Natural history risks without surgery including progeressive enlargement, pain, incarceration & strangulation was discussed.   Contributors to complications such as smoking, obesity, diabetes, prior surgery, etc were discussed.   I feel the risks of no intervention will lead to serious problems that outweigh the operative risks;  therefore, I recommended surgery to reduce and repair the hernia.  I explained laparoscopic techniques with possible need  for an open approach.  I noted the probable use of mesh to patch and/or buttress the hernia repair  Risks such as bleeding, infection, abscess, need for further treatment, heart attack, death, and other risks were discussed.  I noted a good likelihood this will help address the problem.   Goals of post-operative recovery were discussed as well.  Possibility that this will not correct all symptoms was explained.  I stressed the importance of low-impact activity, aggressive pain control, avoiding constipation, & not pushing through pain to minimize risk of post-operative chronic pain or injury. Possibility of reherniation especially with smoking, obesity, diabetes, immunosuppression, and other health conditions was discussed.  We will work to minimize complications.     An educational handout further explaining the pathology & treatment options was given as well.  Questions were answered.  The patient expresses understanding & wishes to proceed with surgery.

## 2012-06-11 ENCOUNTER — Ambulatory Visit: Payer: Medicare Other | Admitting: Internal Medicine

## 2012-06-16 ENCOUNTER — Encounter (INDEPENDENT_AMBULATORY_CARE_PROVIDER_SITE_OTHER): Payer: Self-pay

## 2012-07-01 HISTORY — PX: COLON SURGERY: SHX602

## 2012-07-13 ENCOUNTER — Encounter (HOSPITAL_COMMUNITY): Payer: Self-pay | Admitting: Pharmacy Technician

## 2012-07-14 ENCOUNTER — Encounter (HOSPITAL_COMMUNITY): Payer: Self-pay

## 2012-07-15 ENCOUNTER — Encounter (HOSPITAL_COMMUNITY): Payer: Self-pay

## 2012-07-15 ENCOUNTER — Other Ambulatory Visit (INDEPENDENT_AMBULATORY_CARE_PROVIDER_SITE_OTHER): Payer: Self-pay | Admitting: Surgery

## 2012-07-15 ENCOUNTER — Encounter (HOSPITAL_COMMUNITY)
Admission: RE | Admit: 2012-07-15 | Discharge: 2012-07-15 | Disposition: A | Discharge status: Home / Self Care | Payer: Medicare Other | Source: Ambulatory Visit | Attending: Surgery | Admitting: Surgery

## 2012-07-15 DIAGNOSIS — K5732 Diverticulitis of large intestine without perforation or abscess without bleeding: Secondary | ICD-10-CM

## 2012-07-15 HISTORY — DX: Unspecified abdominal hernia without obstruction or gangrene: K46.9

## 2012-07-15 HISTORY — DX: Personal history of other diseases of the digestive system: Z87.19

## 2012-07-15 HISTORY — DX: Reserved for concepts with insufficient information to code with codable children: IMO0002

## 2012-07-15 LAB — CBC
MCH: 32 pg (ref 26.0–34.0)
MCHC: 34 g/dL (ref 30.0–36.0)
MCV: 94.2 fL (ref 78.0–100.0)
Platelets: 263 10*3/uL (ref 150–400)
RDW: 13.1 % (ref 11.5–15.5)
WBC: 8 10*3/uL (ref 4.0–10.5)

## 2012-07-15 LAB — BASIC METABOLIC PANEL
Calcium: 10 mg/dL (ref 8.4–10.5)
Chloride: 101 mEq/L (ref 96–112)
Creatinine, Ser: 0.95 mg/dL (ref 0.50–1.35)
GFR calc Af Amer: >90 mL/min (ref >90)
GFR calc non Af Amer: 81 mL/min — ABNORMAL LOW (ref >90)

## 2012-07-15 MED ORDER — METRONIDAZOLE 500 MG PO TABS: 500.0000 mg | ORAL_TABLET | ORAL | Status: DC

## 2012-07-15 MED ORDER — NEOMYCIN SULFATE 500 MG PO TABS: 1000.0000 mg | ORAL_TABLET | ORAL | Status: DC

## 2012-07-15 MED ORDER — CHLORHEXIDINE GLUCONATE 4 % EX LIQD: 1.0000 "application " | Freq: Once | CUTANEOUS | Status: DC

## 2012-07-15 NOTE — Pre-Procedure Instructions (Signed)
PREOP CBC, BMET WERE DONE TODAY AT [facility] AS PER ANESTHESIOLOGIST'S GUIDELINES. PT HAS CT ANGIO CHEST REPORT IN EPIC FROM 02/17/12. PT HAS EKG REPORT AND CARDIOLOGIST OFFICE NOTE 02/18/12 IN EPIC FROM VISIT WITH DR. HOCHREIN.

## 2012-07-15 NOTE — Patient Instructions (Addendum)
YOUR SURGERY IS SCHEDULED AT [facility]  ON:  Thursday  1/23  REPORT TO [facility] AT:  8:45 AM      PHONE # FOR SHORT STAY IS [phone]  DO NOT EAT OR DRINK ANYTHING AFTER MIDNIGHT THE NIGHT BEFORE YOUR SURGERY.  YOU MAY BRUSH YOUR TEETH, RINSE OUT YOUR MOUTH--BUT NO WATER, NO FOOD, NO CHEWING GUM, NO MINTS, NO CANDIES, NO CHEWING TOBACCO.  PLEASE TAKE THE FOLLOWING MEDICATIONS THE AM OF YOUR SURGERY WITH A FEW SIPS OF WATER:  METOPROLOL, OMEPRAZOLE.  IF YOU USE INHALERS--USE YOUR INHALERS THE AM OF YOUR SURGERY AND BRING INHALERS TO THE HOSPITAL -TAKE TO SURGERY.    IF YOU ARE DIABETIC:  DO NOT TAKE ANY DIABETIC MEDICATIONS THE AM OF YOUR SURGERY.  IF YOU TAKE INSULIN IN THE EVENINGS--PLEASE ONLY TAKE 1/2 NORMAL EVENING DOSE THE NIGHT BEFORE YOUR SURGERY.  NO INSULIN THE AM OF YOUR SURGERY.  IF YOU HAVE SLEEP APNEA AND USE CPAP OR BIPAP--PLEASE BRING THE MASK AND THE TUBING.  DO NOT BRING YOUR MACHINE.  DO NOT BRING VALUABLES, MONEY, CREDIT CARDS.  DO NOT WEAR JEWELRY, MAKE-UP, NAIL POLISH AND NO METAL PINS OR CLIPS IN YOUR HAIR. CONTACT LENS, DENTURES / PARTIALS, GLASSES SHOULD NOT BE WORN TO SURGERY AND IN MOST CASES-HEARING AIDS WILL NEED TO BE REMOVED.  BRING YOUR GLASSES CASE, ANY EQUIPMENT NEEDED FOR YOUR CONTACT LENS. FOR PATIENTS ADMITTED TO THE HOSPITAL--CHECK OUT TIME THE DAY OF DISCHARGE IS 11:00 AM.  ALL INPATIENT ROOMS ARE PRIVATE - WITH BATHROOM, TELEPHONE, TELEVISION AND WIFI INTERNET.  IF YOU ARE BEING DISCHARGED THE SAME DAY OF YOUR SURGERY--YOU CAN NOT DRIVE YOURSELF HOME--AND SHOULD NOT GO HOME ALONE BY TAXI OR BUS.  NO DRIVING OR OPERATING MACHINERY FOR 24 HOURS FOLLOWING ANESTHESIA / PAIN MEDICATIONS.  PLEASE MAKE ARRANGEMENTS FOR SOMEONE TO BE WITH YOU AT HOME THE FIRST 24 HOURS AFTER SURGERY. RESPONSIBLE DRIVER'S NAME___________________________                                               PHONE #   _______________________                     PLEASE READ OVER ANY  FACT SHEETS THAT YOU WERE GIVEN: MRSA INFORMATION, BLOOD TRANSFUSION INFORMATION, INCENTIVE SPIROMETER INFORMATION. FAILURE TO FOLLOW THESE INSTRUCTIONS MAY RESULT IN THE CANCELLATION OF YOUR SURGERY.   PATIENT SIGNATURE_________________________________   PT NOTIFIED HIS SURGERY TIME WAS MOVED TO 9:35 AM  - HE NEEDS TO ARRIVE TO SHORT STAY BY 7:30 AM--HE VOICED UNDERSTANDING.

## 2012-07-16 ENCOUNTER — Telehealth (INDEPENDENT_AMBULATORY_CARE_PROVIDER_SITE_OTHER): Payer: Self-pay

## 2012-07-16 NOTE — Telephone Encounter (Signed)
Called pt to let him know that Dr Michaell Cowing wants him to do a bowel prep before his surgery. The 2 day rectal prep w/antibiotics of Neomycin and Flagyl. Dr Michaell Cowing ordered the antibiotics to pt's [facility] pharmacy. I will mail the prep instructions to the pt.

## 2012-07-16 NOTE — Telephone Encounter (Signed)
Patient showed up to the pharmacy and his antibiotics were not there. I called [facility] [phone] and made them aware to fill Flagyl 500 mg #6 with no refills and neomycin 500 mg #6 with no refills.

## 2012-07-22 MED ORDER — BUPIVACAINE 0.25 % ON-Q PUMP DUAL CATH 300 ML
300.0000 mL | INJECTION | Status: DC
  Filled 2012-07-22: qty 300

## 2012-07-22 MED ORDER — GENTAMICIN SULFATE 40 MG/ML IJ SOLN
470.0000 mg | INTRAVENOUS | Status: AC
  Administered 2012-07-23: 470 mg via INTRAVENOUS
  Filled 2012-07-22: qty 11.75

## 2012-07-23 ENCOUNTER — Encounter (HOSPITAL_COMMUNITY): Payer: Self-pay | Admitting: Anesthesiology

## 2012-07-23 ENCOUNTER — Inpatient Hospital Stay (HOSPITAL_COMMUNITY): Payer: Medicare Other | Admitting: Anesthesiology

## 2012-07-23 ENCOUNTER — Encounter (HOSPITAL_COMMUNITY): Payer: Self-pay | Admitting: *Deleted

## 2012-07-23 ENCOUNTER — Encounter (HOSPITAL_COMMUNITY): Payer: Self-pay

## 2012-07-23 ENCOUNTER — Encounter (HOSPITAL_COMMUNITY)
Admission: RE | Disposition: A | Discharge status: Home / Self Care | Payer: Self-pay | Source: Ambulatory Visit | Attending: Surgery

## 2012-07-23 ENCOUNTER — Inpatient Hospital Stay (HOSPITAL_COMMUNITY)
Admission: RE | Admit: 2012-07-23 | Discharge: 2012-07-26 | DRG: 331 | Disposition: A | Discharge status: Home / Self Care | Payer: Medicare Other | Source: Ambulatory Visit | Attending: Surgery | Admitting: Surgery

## 2012-07-23 DIAGNOSIS — G4733 Obstructive sleep apnea (adult) (pediatric): Secondary | ICD-10-CM | POA: Diagnosis present

## 2012-07-23 DIAGNOSIS — E669 Obesity, unspecified: Secondary | ICD-10-CM | POA: Diagnosis present

## 2012-07-23 DIAGNOSIS — K429 Umbilical hernia without obstruction or gangrene: Secondary | ICD-10-CM | POA: Diagnosis present

## 2012-07-23 DIAGNOSIS — K219 Gastro-esophageal reflux disease without esophagitis: Secondary | ICD-10-CM | POA: Diagnosis present

## 2012-07-23 DIAGNOSIS — I2510 Atherosclerotic heart disease of native coronary artery without angina pectoris: Secondary | ICD-10-CM | POA: Diagnosis present

## 2012-07-23 DIAGNOSIS — E785 Hyperlipidemia, unspecified: Secondary | ICD-10-CM | POA: Diagnosis present

## 2012-07-23 DIAGNOSIS — K5792 Diverticulitis of intestine, part unspecified, without perforation or abscess without bleeding: Secondary | ICD-10-CM

## 2012-07-23 DIAGNOSIS — I6529 Occlusion and stenosis of unspecified carotid artery: Secondary | ICD-10-CM | POA: Diagnosis present

## 2012-07-23 DIAGNOSIS — Z6833 Body mass index (BMI) 33.0-33.9, adult: Secondary | ICD-10-CM

## 2012-07-23 DIAGNOSIS — Z79899 Other long term (current) drug therapy: Secondary | ICD-10-CM

## 2012-07-23 DIAGNOSIS — K5732 Diverticulitis of large intestine without perforation or abscess without bleeding: Principal | ICD-10-CM | POA: Diagnosis present

## 2012-07-23 DIAGNOSIS — N400 Enlarged prostate without lower urinary tract symptoms: Secondary | ICD-10-CM | POA: Diagnosis present

## 2012-07-23 HISTORY — PX: UMBILICAL HERNIA REPAIR: SHX196

## 2012-07-23 HISTORY — PX: LAPAROSCOPIC SIGMOID COLECTOMY: SHX5928

## 2012-07-23 HISTORY — PX: PROCTOSCOPY: SHX2266

## 2012-07-23 HISTORY — DX: Other specified cardiac arrhythmias: I49.8

## 2012-07-23 LAB — CBC
HCT: 41.5 % (ref 39.0–52.0)
MCHC: 34 g/dL (ref 30.0–36.0)
RDW: 12.6 % (ref 11.5–15.5)
WBC: 17 10*3/uL — ABNORMAL HIGH (ref 4.0–10.5)

## 2012-07-23 LAB — CREATININE, SERUM
Creatinine, Ser: 0.99 mg/dL (ref 0.50–1.35)
GFR calc Af Amer: >90 mL/min (ref >90)
GFR calc non Af Amer: 80 mL/min — ABNORMAL LOW (ref >90)

## 2012-07-23 LAB — TYPE AND SCREEN: ABO/RH(D): O POS

## 2012-07-23 SURGERY — COLECTOMY, SIGMOID, LAPAROSCOPIC
Anesthesia: General | Wound class: Contaminated

## 2012-07-23 MED ORDER — ALVIMOPAN 12 MG PO CAPS
12.0000 mg | ORAL_CAPSULE | Freq: Two times a day (BID) | ORAL | Status: DC
  Administered 2012-07-24 (×2): 12 mg via ORAL
  Filled 2012-07-23 (×4): qty 1

## 2012-07-23 MED ORDER — METOPROLOL TARTRATE 25 MG PO TABS
25.0000 mg | ORAL_TABLET | Freq: Two times a day (BID) | ORAL | Status: DC
  Administered 2012-07-23 – 2012-07-25 (×5): 25 mg via ORAL
  Filled 2012-07-23 (×7): qty 1

## 2012-07-23 MED ORDER — GLYCOPYRROLATE 0.2 MG/ML IJ SOLN
INTRAMUSCULAR | Status: DC | PRN
  Administered 2012-07-23: .6 mg via INTRAVENOUS

## 2012-07-23 MED ORDER — ASPIRIN 81 MG PO CHEW
162.0000 mg | CHEWABLE_TABLET | Freq: Every day | ORAL | Status: DC
  Administered 2012-07-24 – 2012-07-25 (×2): 162 mg via ORAL
  Filled 2012-07-23 (×3): qty 2

## 2012-07-23 MED ORDER — SACCHAROMYCES BOULARDII 250 MG PO CAPS
250.0000 mg | ORAL_CAPSULE | Freq: Two times a day (BID) | ORAL | Status: DC
  Administered 2012-07-23 – 2012-07-25 (×5): 250 mg via ORAL
  Filled 2012-07-23 (×7): qty 1

## 2012-07-23 MED ORDER — GENTAMICIN SULFATE 40 MG/ML IJ SOLN
INTRAMUSCULAR | Status: AC
  Filled 2012-07-23: qty 6

## 2012-07-23 MED ORDER — TAMSULOSIN HCL 0.4 MG PO CAPS
0.4000 mg | ORAL_CAPSULE | Freq: Every day | ORAL | Status: DC
  Administered 2012-07-23 – 2012-07-25 (×3): 0.4 mg via ORAL
  Filled 2012-07-23 (×4): qty 1

## 2012-07-23 MED ORDER — OXYCODONE HCL 5 MG PO TABS: 5.0000 mg | ORAL_TABLET | ORAL | Status: DC | PRN

## 2012-07-23 MED ORDER — PROMETHAZINE HCL 25 MG/ML IJ SOLN: 12.5000 mg | Freq: Four times a day (QID) | INTRAMUSCULAR | Status: DC | PRN

## 2012-07-23 MED ORDER — HEPARIN SODIUM (PORCINE) 5000 UNIT/ML IJ SOLN
5000.0000 [IU] | Freq: Once | INTRAMUSCULAR | Status: AC
  Administered 2012-07-23: 5000 [IU] via SUBCUTANEOUS
  Filled 2012-07-23: qty 1

## 2012-07-23 MED ORDER — HYDROMORPHONE HCL PF 1 MG/ML IJ SOLN
0.2500 mg | INTRAMUSCULAR | Status: DC | PRN
Start: 2012-07-23 — End: 2012-07-23
  Administered 2012-07-23: 0.5 mg via INTRAVENOUS

## 2012-07-23 MED ORDER — LACTATED RINGERS IV SOLN
INTRAVENOUS | Status: DC
  Administered 2012-07-23: 13:00:00 via INTRAVENOUS
  Administered 2012-07-23: 1000 mL via INTRAVENOUS
  Administered 2012-07-23: 11:00:00 via INTRAVENOUS

## 2012-07-23 MED ORDER — LACTATED RINGERS IV SOLN
INTRAVENOUS | Status: DC
  Administered 2012-07-23 – 2012-07-24 (×2): via INTRAVENOUS

## 2012-07-23 MED ORDER — EPHEDRINE SULFATE 50 MG/ML IJ SOLN
INTRAMUSCULAR | Status: DC | PRN
  Administered 2012-07-23: 10 mg via INTRAVENOUS

## 2012-07-23 MED ORDER — CISATRACURIUM BESYLATE (PF) 10 MG/5ML IV SOLN
INTRAVENOUS | Status: DC | PRN
  Administered 2012-07-23: 6 mg via INTRAVENOUS
  Administered 2012-07-23: 10 mg via INTRAVENOUS

## 2012-07-23 MED ORDER — STERILE WATER FOR IRRIGATION IR SOLN
Status: DC | PRN
  Administered 2012-07-23: 1500 mL

## 2012-07-23 MED ORDER — CLINDAMYCIN PHOSPHATE 900 MG/50ML IV SOLN
900.0000 mg | INTRAVENOUS | Status: AC
  Administered 2012-07-23: 900 mg via INTRAVENOUS

## 2012-07-23 MED ORDER — ALVIMOPAN 12 MG PO CAPS
12.0000 mg | ORAL_CAPSULE | Freq: Once | ORAL | Status: AC
  Administered 2012-07-23: 12 mg via ORAL
  Filled 2012-07-23: qty 1

## 2012-07-23 MED ORDER — LIDOCAINE HCL (CARDIAC) 20 MG/ML IV SOLN
INTRAVENOUS | Status: DC | PRN
  Administered 2012-07-23: 100 mg via INTRAVENOUS

## 2012-07-23 MED ORDER — CLINDAMYCIN PHOSPHATE 600 MG/50ML IV SOLN
600.0000 mg | Freq: Four times a day (QID) | INTRAVENOUS | Status: AC
  Administered 2012-07-23 – 2012-07-24 (×3): 600 mg via INTRAVENOUS
  Filled 2012-07-23 (×3): qty 50

## 2012-07-23 MED ORDER — MENTHOL 3 MG MT LOZG
1.0000 | LOZENGE | OROMUCOSAL | Status: DC | PRN
  Filled 2012-07-23: qty 9

## 2012-07-23 MED ORDER — BUPIVACAINE-EPINEPHRINE 0.25% -1:200000 IJ SOLN
INTRAMUSCULAR | Status: DC | PRN
  Administered 2012-07-23: 50 mL

## 2012-07-23 MED ORDER — MAGIC MOUTHWASH
15.0000 mL | Freq: Four times a day (QID) | ORAL | Status: DC | PRN
  Filled 2012-07-23 (×2): qty 15

## 2012-07-23 MED ORDER — DIPHENHYDRAMINE HCL 50 MG/ML IJ SOLN: 12.5000 mg | Freq: Four times a day (QID) | INTRAMUSCULAR | Status: DC | PRN

## 2012-07-23 MED ORDER — METOPROLOL TARTRATE 12.5 MG HALF TABLET
12.5000 mg | ORAL_TABLET | Freq: Two times a day (BID) | ORAL | Status: DC | PRN
  Filled 2012-07-23: qty 1

## 2012-07-23 MED ORDER — ASPIRIN 325 MG PO TABS: 162.5000 mg | ORAL_TABLET | Freq: Every day | ORAL | Status: DC

## 2012-07-23 MED ORDER — HYDROMORPHONE HCL PF 1 MG/ML IJ SOLN
INTRAMUSCULAR | Status: DC | PRN
  Administered 2012-07-23 (×2): .4 mg via INTRAVENOUS

## 2012-07-23 MED ORDER — LACTATED RINGERS IV BOLUS (SEPSIS): 1000.0000 mL | Freq: Three times a day (TID) | INTRAVENOUS | Status: AC | PRN

## 2012-07-23 MED ORDER — 0.9 % SODIUM CHLORIDE (POUR BTL) OPTIME
TOPICAL | Status: DC | PRN
  Administered 2012-07-23: 1000 mL

## 2012-07-23 MED ORDER — ONDANSETRON HCL 4 MG/2ML IJ SOLN
INTRAMUSCULAR | Status: DC | PRN
  Administered 2012-07-23: 4 mg via INTRAVENOUS

## 2012-07-23 MED ORDER — PROMETHAZINE HCL 25 MG/ML IJ SOLN: 6.2500 mg | INTRAMUSCULAR | Status: DC | PRN

## 2012-07-23 MED ORDER — HEPARIN SODIUM (PORCINE) 5000 UNIT/ML IJ SOLN
5000.0000 [IU] | Freq: Three times a day (TID) | INTRAMUSCULAR | Status: DC
  Administered 2012-07-24 – 2012-07-26 (×7): 5000 [IU] via SUBCUTANEOUS
  Filled 2012-07-23 (×10): qty 1

## 2012-07-23 MED ORDER — PANTOPRAZOLE SODIUM 40 MG PO TBEC
40.0000 mg | DELAYED_RELEASE_TABLET | Freq: Every day | ORAL | Status: DC
  Administered 2012-07-24 – 2012-07-25 (×2): 40 mg via ORAL
  Filled 2012-07-23 (×3): qty 1

## 2012-07-23 MED ORDER — BUPIVACAINE 0.25 % ON-Q PUMP DUAL CATH 300 ML
INJECTION | Status: DC | PRN
  Administered 2012-07-23: 300 mL

## 2012-07-23 MED ORDER — SUFENTANIL CITRATE 50 MCG/ML IV SOLN
INTRAVENOUS | Status: DC | PRN
  Administered 2012-07-23: 10 ug via INTRAVENOUS
  Administered 2012-07-23: 20 ug via INTRAVENOUS
  Administered 2012-07-23 (×2): 10 ug via INTRAVENOUS

## 2012-07-23 MED ORDER — ACETAMINOPHEN 500 MG PO TABS
1000.0000 mg | ORAL_TABLET | Freq: Three times a day (TID) | ORAL | Status: DC
  Administered 2012-07-23 – 2012-07-25 (×8): 1000 mg via ORAL
  Filled 2012-07-23 (×12): qty 2

## 2012-07-23 MED ORDER — ALUM & MAG HYDROXIDE-SIMETH 200-200-20 MG/5ML PO SUSP: 30.0000 mL | Freq: Four times a day (QID) | ORAL | Status: DC | PRN

## 2012-07-23 MED ORDER — MIDAZOLAM HCL 5 MG/5ML IJ SOLN
INTRAMUSCULAR | Status: DC | PRN
  Administered 2012-07-23: 2 mg via INTRAVENOUS

## 2012-07-23 MED ORDER — NEOSTIGMINE METHYLSULFATE 1 MG/ML IJ SOLN
INTRAMUSCULAR | Status: DC | PRN
  Administered 2012-07-23: 4 mg via INTRAVENOUS

## 2012-07-23 MED ORDER — ACETAMINOPHEN 10 MG/ML IV SOLN
INTRAVENOUS | Status: DC | PRN
  Administered 2012-07-23: 1000 mg via INTRAVENOUS

## 2012-07-23 MED ORDER — PHENOL 1.4 % MT LIQD
2.0000 | OROMUCOSAL | Status: DC | PRN
  Administered 2012-07-23 – 2012-07-24 (×2): 2 via OROMUCOSAL
  Filled 2012-07-23: qty 177

## 2012-07-23 MED ORDER — HYDROMORPHONE HCL PF 1 MG/ML IJ SOLN
0.5000 mg | INTRAMUSCULAR | Status: DC | PRN
  Administered 2012-07-24 – 2012-07-25 (×4): 1 mg via INTRAVENOUS
  Filled 2012-07-23 (×4): qty 1

## 2012-07-23 MED ORDER — DEXAMETHASONE SODIUM PHOSPHATE 10 MG/ML IJ SOLN
INTRAMUSCULAR | Status: DC | PRN
  Administered 2012-07-23: 10 mg via INTRAVENOUS

## 2012-07-23 MED ORDER — LIP MEDEX EX OINT
1.0000 "application " | TOPICAL_OINTMENT | Freq: Two times a day (BID) | CUTANEOUS | Status: DC
  Administered 2012-07-23 – 2012-07-25 (×4): 1 via TOPICAL
  Filled 2012-07-23: qty 7

## 2012-07-23 MED ORDER — PROPOFOL 10 MG/ML IV BOLUS
INTRAVENOUS | Status: DC | PRN
  Administered 2012-07-23: 200 mg via INTRAVENOUS

## 2012-07-23 MED ORDER — SODIUM CHLORIDE 0.9 % IR SOLN
Status: DC | PRN
  Administered 2012-07-23: 1000 mL

## 2012-07-23 MED ORDER — LACTATED RINGERS IR SOLN
Status: DC | PRN
  Administered 2012-07-23: 3000 mL

## 2012-07-23 MED ORDER — OXYMETAZOLINE HCL 0.05 % NA SOLN
2.0000 | Freq: Two times a day (BID) | NASAL | Status: DC | PRN
  Administered 2012-07-24 – 2012-07-25 (×3): 2 via NASAL
  Filled 2012-07-23: qty 15

## 2012-07-23 MED ORDER — BIOTENE DRY MOUTH MT LIQD
15.0000 mL | Freq: Two times a day (BID) | OROMUCOSAL | Status: DC
  Administered 2012-07-23 – 2012-07-25 (×5): 15 mL via OROMUCOSAL

## 2012-07-23 MED ORDER — BUPIVACAINE 0.25 % ON-Q PUMP DUAL CATH 300 ML: 300.0000 mL | INJECTION | Status: DC

## 2012-07-23 MED ORDER — ONDANSETRON HCL 4 MG/2ML IJ SOLN: 4.0000 mg | Freq: Four times a day (QID) | INTRAMUSCULAR | Status: DC | PRN

## 2012-07-23 SURGICAL SUPPLY — 94 items
APPLIER CLIP ROT 10 11.4 M/L (STAPLE)
BLADE HEX COATED 2.75 (ELECTRODE) ×2 IMPLANT
BLADE SURG 15 STRL LF DISP TIS (BLADE) ×1 IMPLANT
BLADE SURG 15 STRL SS (BLADE) ×1
BLADE SURG ROTATE 9660 (MISCELLANEOUS) IMPLANT
CABLE HIGH FREQUENCY MONO STRZ (ELECTRODE) ×2 IMPLANT
CANISTER SUCTION 2500CC (MISCELLANEOUS) ×4 IMPLANT
CATH KIT ON Q 7.5IN SLV (PAIN MANAGEMENT) ×4 IMPLANT
CELLS DAT CNTRL 66122 CELL SVR (MISCELLANEOUS) IMPLANT
CHLORAPREP W/TINT 26ML (MISCELLANEOUS) ×2 IMPLANT
CLIP APPLIE ROT 10 11.4 M/L (STAPLE) IMPLANT
CLOTH BEACON ORANGE TIMEOUT ST (SAFETY) ×2 IMPLANT
COVER SURGICAL LIGHT HANDLE (MISCELLANEOUS) IMPLANT
DECANTER SPIKE VIAL GLASS SM (MISCELLANEOUS) ×2 IMPLANT
DISSECTOR BLUNT TIP ENDO 5MM (MISCELLANEOUS) IMPLANT
DRAPE LAPAROSCOPIC ABDOMINAL (DRAPES) ×2 IMPLANT
DRAPE LAPAROTOMY T 102X78X121 (DRAPES) IMPLANT
DRAPE UTILITY XL STRL (DRAPES) ×2 IMPLANT
DRAPE WARM FLUID 44X44 (DRAPE) ×4 IMPLANT
DRSG PAD ABDOMINAL 8X10 ST (GAUZE/BANDAGES/DRESSINGS) IMPLANT
DRSG TEGADERM 4X4.75 (GAUZE/BANDAGES/DRESSINGS) IMPLANT
ELECT CAUTERY BLADE 6.4 (BLADE) ×2 IMPLANT
ELECT REM PT RETURN 9FT ADLT (ELECTROSURGICAL) ×2
ELECTRODE REM PT RTRN 9FT ADLT (ELECTROSURGICAL) ×1 IMPLANT
ENDOLOOP SUT PDS II  0 18 (SUTURE) ×1
ENDOLOOP SUT PDS II 0 18 (SUTURE) ×1 IMPLANT
GAUZE SPONGE 4X4 16PLY XRAY LF (GAUZE/BANDAGES/DRESSINGS) IMPLANT
GLOVE BIOGEL PI IND STRL 6.5 (GLOVE) ×1 IMPLANT
GLOVE BIOGEL PI IND STRL 7.0 (GLOVE) ×1 IMPLANT
GLOVE BIOGEL PI INDICATOR 6.5 (GLOVE) ×1
GLOVE BIOGEL PI INDICATOR 7.0 (GLOVE) ×1
GLOVE ECLIPSE 8.0 STRL XLNG CF (GLOVE) ×4 IMPLANT
GLOVE INDICATOR 8.0 STRL GRN (GLOVE) ×4 IMPLANT
GOWN STRL NON-REIN LRG LVL3 (GOWN DISPOSABLE) ×2 IMPLANT
GOWN STRL REIN XL XLG (GOWN DISPOSABLE) ×8 IMPLANT
KIT BASIN OR (CUSTOM PROCEDURE TRAY) ×2 IMPLANT
LEGGING LITHOTOMY PAIR STRL (DRAPES) ×2 IMPLANT
LIGASURE IMPACT 36 18CM CVD LR (INSTRUMENTS) IMPLANT
NDL SAFETY ECLIPSE 18X1.5 (NEEDLE) IMPLANT
NEEDLE HYPO 18GX1.5 SHARP (NEEDLE)
NEEDLE HYPO 22GX1.5 SAFETY (NEEDLE) IMPLANT
NS IRRIG 1000ML POUR BTL (IV SOLUTION) ×4 IMPLANT
PENCIL BUTTON HOLSTER BLD 10FT (ELECTRODE) ×2 IMPLANT
RTRCTR WOUND ALEXIS 18CM MED (MISCELLANEOUS)
SCALPEL HARMONIC ACE (MISCELLANEOUS) IMPLANT
SCISSORS LAP 5X35 DISP (ENDOMECHANICALS) ×2 IMPLANT
SEALER TISSUE G2 CVD JAW 35 (ENDOMECHANICALS) IMPLANT
SEALER TISSUE G2 CVD JAW 45CM (ENDOMECHANICALS)
SEALER TISSUE G2 STRG ARTC 35C (ENDOMECHANICALS) ×2 IMPLANT
SET IRRIG TUBING LAPAROSCOPIC (IRRIGATION / IRRIGATOR) ×2 IMPLANT
SLEEVE ENDOPATH XCEL 5M (ENDOMECHANICALS) ×2 IMPLANT
SLEEVE XCEL OPT CAN 5 100 (ENDOMECHANICALS) ×2 IMPLANT
SPONGE GAUZE 4X4 12PLY (GAUZE/BANDAGES/DRESSINGS) IMPLANT
SPONGE LAP 18X18 X RAY DECT (DISPOSABLE) ×2 IMPLANT
SPONGE SURGIFOAM ABS GEL 12-7 (HEMOSTASIS) IMPLANT
STAPLER CUT CVD 40MM BLUE (STAPLE) ×2 IMPLANT
STAPLER VISISTAT 35W (STAPLE) ×2 IMPLANT
SUCTION POOLE TIP (SUCTIONS) ×2 IMPLANT
SUT CHROMIC 2 0 SH (SUTURE) IMPLANT
SUT CHROMIC 3 0 SH 27 (SUTURE) IMPLANT
SUT ETHIBOND NAB CT1 #1 30IN (SUTURE) IMPLANT
SUT MNCRL AB 4-0 PS2 18 (SUTURE) ×2 IMPLANT
SUT NOVA NAB GS-21 0 18 T12 DT (SUTURE) IMPLANT
SUT PDS AB 1 CTX 36 (SUTURE) ×4 IMPLANT
SUT PROLENE 0 CT 1 30 (SUTURE) IMPLANT
SUT PROLENE 0 CT 1 CR/8 (SUTURE) IMPLANT
SUT PROLENE 2 0 CT2 30 (SUTURE) IMPLANT
SUT PROLENE 2 0 KS (SUTURE) IMPLANT
SUT SILK 2 0 (SUTURE) ×1
SUT SILK 2 0 SH CR/8 (SUTURE) ×2 IMPLANT
SUT SILK 2-0 18XBRD TIE 12 (SUTURE) ×1 IMPLANT
SUT SILK 3 0 (SUTURE) ×1
SUT SILK 3 0 SH CR/8 (SUTURE) ×2 IMPLANT
SUT SILK 3-0 18XBRD TIE 12 (SUTURE) ×1 IMPLANT
SUT VIC AB 2-0 SH 27 (SUTURE)
SUT VIC AB 2-0 SH 27X BRD (SUTURE) IMPLANT
SUT VIC AB 4-0 SH 18 (SUTURE) IMPLANT
SYR 20CC LL (SYRINGE) IMPLANT
SYR BULB IRRIGATION 50ML (SYRINGE) ×2 IMPLANT
SYS LAPSCP GELPORT 120MM (MISCELLANEOUS) ×2
SYSTEM LAPSCP GELPORT 120MM (MISCELLANEOUS) ×1 IMPLANT
TOWEL OR 17X26 10 PK STRL BLUE (TOWEL DISPOSABLE) ×4 IMPLANT
TRAY FOLEY CATH 14FRSI W/METER (CATHETERS) ×2 IMPLANT
TRAY LAP CHOLE (CUSTOM PROCEDURE TRAY) ×2 IMPLANT
TROCAR XCEL 12X100 BLDLESS (ENDOMECHANICALS) IMPLANT
TROCAR XCEL BLUNT TIP 100MML (ENDOMECHANICALS) IMPLANT
TROCAR XCEL NON-BLD 11X100MML (ENDOMECHANICALS) IMPLANT
TROCAR XCEL NON-BLD 5MMX100MML (ENDOMECHANICALS) ×6 IMPLANT
TROCAR Z-THREAD FIOS 5X100MM (TROCAR) IMPLANT
TUBING FILTER THERMOFLATOR (ELECTROSURGICAL) ×2 IMPLANT
TUNNELER SHEATH ON-Q 16GX12 DP (PAIN MANAGEMENT) ×2 IMPLANT
WATER STERILE IRR 1000ML POUR (IV SOLUTION) ×2 IMPLANT
YANKAUER SUCT BULB TIP 10FT TU (MISCELLANEOUS) ×2 IMPLANT
YANKAUER SUCT BULB TIP NO VENT (SUCTIONS) ×2 IMPLANT

## 2012-07-23 NOTE — Anesthesia Procedure Notes (Signed)
Procedure Name: Intubation Date/Time: 07/23/2012 10:26 AM Performed by: Leroy Libman L Patient Re-evaluated:Patient Re-evaluated prior to inductionOxygen Delivery Method: Circle system utilized Preoxygenation: Pre-oxygenation with 100% oxygen Intubation Type: IV induction Ventilation: Mask ventilation without difficulty and Oral airway inserted - appropriate to patient size Laryngoscope Size: Miller and 3 Grade View: Grade I Tube type: Oral Tube size: 8.0 mm Number of attempts: 1 Airway Equipment and Method: Stylet Placement Confirmation: ETT inserted through vocal cords under direct vision,  breath sounds checked- equal and bilateral and positive ETCO2 Secured at: 22 cm Tube secured with: Tape Dental Injury: Teeth and Oropharynx as per pre-operative assessment

## 2012-07-23 NOTE — Anesthesia Postprocedure Evaluation (Signed)
  Anesthesia Post-op Note  Patient: Cristian Moore  Procedure(s) Performed: Procedure(s) (LRB): LAPAROSCOPIC SIGMOID COLECTOMY (N/A) HERNIA REPAIR UMBILICAL ADULT (N/A) PROCTOSCOPY (N/A)  Patient Location: PACU  Anesthesia Type: General  Level of Consciousness: awake and alert   Airway and Oxygen Therapy: Patient Spontanous Breathing  Post-op Pain: mild  Post-op Assessment: Post-op Vital signs reviewed, Patient's Cardiovascular Status Stable, Respiratory Function Stable, Patent Airway and No signs of Nausea or vomiting  Last Vitals:  Filed Vitals:   07/23/12 1415  BP: 130/67  Pulse: 69  Temp:   Resp: 11    Post-op Vital Signs: stable   Complications: No apparent anesthesia complications

## 2012-07-23 NOTE — Op Note (Signed)
07/23/2012  1:26 PM  PATIENT:  Cristian Moore  72 y.o. male  Patient Care Team: Ernestina Penna, MD as PCP - General (Family Medicine) Iva Boop, MD as Consulting Physician (Gastroenterology) [name], MD as Consulting Physician (Cardiology)  PRE-OPERATIVE DIAGNOSIS:  recurrent diverticulitis and umbilical hernia  POST-OPERATIVE DIAGNOSIS:  recurrent diverticulitis, umbilical hernia  PROCEDURE:  Procedure(s): LAPAROSCOPIC SIGMOID COLECTOMY LAPAROSCOPIC MOBILIZATION OF SPLENIC FLEXURE OF COLON HERNIA REPAIR UMBILICAL ADULT PROCTOSCOPY  SURGEON:  Surgeon(s): Ardeth Sportsman, MD Almond Lint, MD - Assistant  ANESTHESIA:   local and general  EBL:  Total I/O In: 2000 [I.V.:2000] Out: 150 [Urine:100; Blood:50]  Delay start of Pharmacological VTE agent (>24hrs) due to surgical blood loss or risk of bleeding:  no  DRAINS: none   SPECIMEN:  Source of Specimen:  Sigmoid colon (open end proximal)  DISPOSITION OF SPECIMEN:  PATHOLOGY  COUNTS:  YES  PLAN OF CARE: Admit to inpatient   PATIENT DISPOSITION:  PACU - hemodynamically stable.  INDICATION:    Obese male with recurrent episodes of sigmoid diverticulitis.  I recommended segmental resection:  The anatomy & physiology of the digestive tract was discussed.  The pathophysiology was discussed.  Natural history risks without surgery was discussed.   I worked to give an overview of the disease and the frequent need to have multispecialty involvement.  I feel the risks of no intervention will lead to serious problems that outweigh the operative risks; therefore, I recommended a partial colectomy to remove the pathology.  Laparoscopic & open techniques were discussed.   Risks such as bleeding, infection, abscess, leak, reoperation, possible ostomy, hernia, heart attack, death, and other risks were discussed.  I noted a good likelihood this will help address the problem.   Goals of post-operative recovery were discussed  as well.  We will work to minimize complications.  An educational handout on the pathology was given as well.  Questions were answered.    The patient expresses understanding & wishes to proceed with surgery.  OR FINDINGS:   Patient had Thickened inflammation of most of the sigmoid colon.  The descending colon was much more soft.  No obvious metastatic disease on visceral parietal peritoneum or liver.  The anastomosis rests 16 cm from the anal verge by rigid proctoscopy.  DESCRIPTION:   Informed consent was confirmed.  The patient underwent general anaesthesia without difficulty.  The patient was positioned appropriately.  VTE prevention in place.  The patient's abdomen was clipped, prepped, & draped in a sterile fashion.  Surgical timeout confirmed our plan.  The patient was positioned in reverse Trendelenburg.  Abdominal entry was gained using optical entry technique in the right upper abdomen.  Entry was clean.  I induced carbon dioxide insufflation.  Camera inspection revealed no injury.  Extra ports were carefully placed under direct laparoscopic visualization.  I reflected the greater omentum and the upper abdomen the small bowel in the upper abdomen. I scored the base of peritoneum of the right side of the mesentery of the left colon from the ligament of Treitz to the peritoneal rectal reflection.   I elevated the sigmoid mesentery and got into the retro-mesenteric plane. We were able to identify the left ureter and gonadal vessels. We kept those posterior within the retroperitoneum and elevated the left colon mesentery off that. I did free off of mesentery to the IMA pedicle but did not ligate it yet.  I continued distally and got into the avascular plane posterior to the  mesorectum. This allowed me to help mobilize the rectum as well by freeing the mesorectum off the sacrum.I mobilized the peritoneal coverings to the peritoneal reflection on both the right and left sides of the rectum.  I  could see the right and left ureters and stayed away from the house.  I kept in the lateral vascular pedicles to the rectum intact.  I skeletonized the lymph nodes off the inferior mesenteric artery pedicle.  I went down to its takeoff from the aorta.  I isolated the inferior mesenteric vein off of the ligament of Treitz just cephalad to that as well.  After confirming the left ureter was out of the way, I went ahead and ligated the inferior mesenteric artery pedicle with bipolar EnSeal just near its takeoff from the aorta.   I did ligate the inferior mesenteric vein in a similar fashion.  We ensured hemostasis. I skeletonized the mesorectum at the junction at the proximal rectum using blunt dissection & bipolar EnSeal.  I mobilized the left colon some more to ensure good mobilization of the left colon to reach into the pelvis.  Because his the sigmoid colon was foreshortened and had inflammation in his distal descending colon, I decided to mobilize the splenic flexure of the colon.  I mobilized the proximal descending colon in a lateral to medial fashion.  I freed each of the splenic flexure off the kidney.  I freed the greater omentum off the mid transverse colon and follow that distally towards the splenic flexure.  With that I could better mobilize the distal transverse colon off its attachments to the inferior pancreatic head using bipolar system.  With that I had excellent mobilization of the entire left colon.  I placed a GelPort has a wound protector through a Pfannenstiel incision in the suprapubic region, taking care to avoid bladder injury. I transected bowel at the junction between the proximal and mid rectum using a contour stapler. I was able to eviscerate the rectosigmoid and descending colon out the wound.  I chose a region at the descending/sigmoid junction that was soft and easily reached down. I clamped the colon proximal to this area using a soft bowel clamp. I transected at the  descending/sigmoid junction with a scalpel. I got healthy bleeding mucosa. I transected the remaining specimen mesentery in a radial fashion to preserve good blood supply to the proximal colon end.  We sent the rectosigmoid colon specimen off to go to pathology.  We sized the colon orifice.  I chose a 33 EEA anvil stapler system. I placed the anvil to the open end of the descending colon and closed around it using a 0 Prolene pursestring.  We did copious irrigation with crystalloid solution.  Hemostasis was good.      Dr. Donell Beers scrubbed down and did gentle anal dilation and advanced the EEA stapler up the rectal stump. The spike was brought out at the provimal end of the rectal stump under direct visualization.  I attached the anvil of the proximal colon the spike of the stapler. Anvil was tightened down and held clamped for 60 seconds. The EEA stapler was fired and held clamped for 30 seconds. The stapler was released & removed. We noted 2 excellent anastomotic rings. Blue stitch is in the proximal ring.  Dr. Donell Beers did rigid proctoscopy noted the anastomosis was at 16 cm from the anal verge consistent with the proximal rectum.  We did a final irrigation of antibiotic solution (900 mg clindamycin/240 mg gentamicin in  a liter of crystalloid) & held that for the pelvic air leak test .The rectum was insufflated the rectum while clamping the colon proximal to that anastomosis.  There was a negative air leak test. There was no tension. Anastomosis & colon looked viable.    We changed gown and gloves.  We did diagnostic laparoscopy.  We aspirated the antibiotic irrigation.  Hemostasis was good.   Ureters & bowel uninjured.  The anastomosis looked healthy.  I placed On-Q catheter and sheaths into the preperitoneal space under direct palpation.  I removed CO2 gas out through the ports.  I closed the 5mm port sites using Monocryl stitch and sterile dressing.  Closed the Pfannenstiel wound using a 0 Vicryl vertical  peritoneal closure and a #1 PDS transverse anterior rectal fascial closure.   I freed around the umbilical stalk and transected off of the anterior fascia.  Noted a hernia in the helical stalk.  8 mm.  I repaired it primarily with transfers #1 PDS interrupted stitches to good result.  I rechecked down the bellybutton to the fascia using a Monocryl stitch.  I closed the skin with some interrupted Monocryl stitches. I placed soaked wicks in between those areas. I placed sterile dressing.  OnQ catheters placed & sheaths peeled away.  Patient is being extubated go to recovery room. I discussed postop care with the patient in detail the office & in the holding area. Instructions are written. I'm about to locate family and discuss it with them as well.

## 2012-07-23 NOTE — Anesthesia Preprocedure Evaluation (Addendum)
Anesthesia Evaluation  Patient identified by MRN, date of birth, ID band Patient awake    Reviewed: Allergy & Precautions, H&P , NPO status , Patient's Chart, lab work & pertinent test results, reviewed documented beta blocker date and time   Airway Mallampati: II TM Distance: >3 FB Neck ROM: full    Dental No notable dental hx.    Pulmonary shortness of breath, sleep apnea , former smoker,  breath sounds clear to auscultation  Pulmonary exam normal       Cardiovascular Exercise Tolerance: Good + Peripheral Vascular Disease + dysrhythmias Rhythm:regular Rate:Normal  Carotid artery stenosis. H/O supraventricular tachycardia. H/O chest pain. He saw Dr. Antoine Poche August 2013 and chest pain was atypical. Stress Test fall 2012 OK.  Echo and myoview reviewed.   Neuro/Psych  Neuromuscular disease negative neurological ROS  negative psych ROS   GI/Hepatic negative GI ROS, Neg liver ROS, hiatal hernia, GERD-  ,  Endo/Other  negative endocrine ROS  Renal/GU negative Renal ROS  negative genitourinary   Musculoskeletal   Abdominal   Peds  Hematology negative hematology ROS (+)   Anesthesia Other Findings   Reproductive/Obstetrics negative OB ROS                        Anesthesia Physical Anesthesia Plan  ASA: III  Anesthesia Plan: General ETT   Post-op Pain Management:    Induction:   Airway Management Planned:   Additional Equipment:   Intra-op Plan:   Post-operative Plan:   Informed Consent: I have reviewed the patients History and Physical, chart, labs and discussed the procedure including the risks, benefits and alternatives for the proposed anesthesia with the patient or authorized representative who has indicated his/her understanding and acceptance.   Dental Advisory Given  Plan Discussed with: CRNA  Anesthesia Plan Comments:         Anesthesia Quick Evaluation

## 2012-07-23 NOTE — Transfer of Care (Signed)
Immediate Anesthesia Transfer of Care Note  Patient: Cristian Moore  Procedure(s) Performed: Procedure(s) (LRB) with comments: LAPAROSCOPIC SIGMOID COLECTOMY (N/A) - Laparoscopic Sigmoid Colectomy HERNIA REPAIR UMBILICAL ADULT (N/A) - Primary Umbilical Hernia Repair PROCTOSCOPY (N/A) - Rigid Proctoscopy  Patient Location: PACU  Anesthesia Type:General  Level of Consciousness: awake, alert  and oriented  Airway & Oxygen Therapy: Patient Spontanous Breathing and Patient connected to face mask oxygen  Post-op Assessment: Report given to PACU RN and Post -op Vital signs reviewed and stable  Post vital signs: Reviewed and stable  Complications: No apparent anesthesia complications

## 2012-07-23 NOTE — H&P (Signed)
Cristian Moore  Nov 01, 1940 [id number]   Reason for evaluation: Recurrent diverticulitis. Now on chronic antibiotics.   Was not obese male with history of intermittent diverticulitis. He thinks his first attack was 15 years ago. History of colon polyps with numerous endoscopies. Followed by [facility] gastroenterology, and Dr. Kinnie Scales, now back to her gastroenterology. Had another attack in October. Left lower quadrant pain and some low-grade fevers. CT scan confirmed inflammation consistent with diverticulitis. Was placed on oral antibiotics. Improved. Then recurred. He is now on his third round of 10 day course of Cipro/Flagyl. Every time he goes off he feels pain, back. Was seen by gastroenterology. Given the numerous attacks, he was sent to me for surgical evaluation.   Normally has 2-3 bowel movements a day. They have been more loose on the oral antibiotics. Using a probiotic. Semiretired Visual merchandiser. Can walk 30 minutes without much difficulty. Had an episode of what sounds like SVT. More stable on beta blocker. Followed by cardiology, Dr. Antoine Poche. No abdominal surgeries.   No new events.  Cleared for surgery    Past Medical History  Diagnosis Date  . Hyperlipidemia   . Musculoskeletal pain     in the right shoulder - S/P ROTATOR CUFF REPAIR-BUT STILL HAS PAIN  . Laceration  01/11/2009     Deep laceration left index finger dorsally  . Diverticulosis   . Carotid stenosis   . Exogenous obesity   . Nasal congestion   . Thyroiditis   . Diverticulitis   . Benign prostatic hypertrophy   . Arthritis   . GERD (gastroesophageal reflux disease)   . Rectal bleeding     PT ATTRIBUTES TO HEMORRHOIDS  . Difficulty urinating   . Supraventricular tachycardia   . Tachyarrhythmia   . Shortness of breath     SOMETIMES WITH EXERTION  . Obstructive sleep apnea      continuous positive airway pressure  AUTO SET 6 TO 20 - USUALLY SETTLES OUT AT 10  . Hernia     BELLY BUTTON  . Ulcer 2008  . H/O  hiatal hernia     Past Surgical History  Procedure Date  . Inguinal hernia repair   . Kidney stone surgery   . Tonsillectomy   . Colonoscopy     for polyps  . Rotator cuff repair   . Knee surgery   . Elbow bursa surgery     History   Social History  . Marital Status: Married    Spouse Name: N/A    Number of Children: 4  . Years of Education: N/A   Occupational History  . Retired    Social History Main Topics  . Smoking status: Former Smoker    Types: Cigars    Quit date: 08/12/1999  . Smokeless tobacco: Not on file  . Alcohol Use: No  . Drug Use: No  . Sexually Active: Not on file   Other Topics Concern  . Not on file   Social History Narrative  . No narrative on file    Family History  Problem Relation Age of Onset  . Stroke Mother   . Heart disease Mother     Valvular heart disease    Current Facility-Administered Medications  Medication Dose Route Frequency Provider Last Rate Last Dose  . bupivacaine 0.25 % ON-Q pump DUAL CATH 300 mL  300 mL Other Continuous Ardeth Sportsman, MD      . clindamycin (CLEOCIN) IVPB 900 mg  900 mg Intravenous On Call to OR  Ardeth Sportsman, MD      . gentamicin (GARAMYCIN) 470 mg in dextrose 5 % 50 mL IVPB  470 mg Intravenous On Call to OR Ardeth Sportsman, MD         Allergies  Allergen Reactions  . Penicillins Rash    BP 133/85  Pulse 75  Temp 97.7 F (36.5 C) (Oral)  Resp 18  SpO2 100%    Results:   Labs: Results for orders placed during the hospital encounter of 07/23/12 (from the past 48 hour(s))  TYPE AND SCREEN     Status: Normal   Collection Time   07/23/12  8:15 AM      Component Value Range Comment   ABO/RH(D) O POS      Antibody Screen NEG      Sample Expiration 07/26/2012       Imaging / Studies: No results found.  Medications / Allergies: per chart  Antibiotics: Anti-infectives     Start     Dose/Rate Route Frequency Ordered Stop   07/23/12 0730   clindamycin (CLEOCIN) IVPB 900 mg         900 mg 100 mL/hr over 30 Minutes Intravenous On call to O.R. 07/23/12 2440 07/24/12 0559   07/23/12 0600   gentamicin (GARAMYCIN) 470 mg in dextrose 5 % 50 mL IVPB     Comments: Pharmacy may adjust dosing strength, schedule, rate of infusion, etc as needed to optimize therapy      470 mg 123.5 mL/hr over 30 Minutes Intravenous On call to O.R. 07/22/12 1659 07/24/12 0559         Review of Systems  Constitutional: Negative for fever, chills and diaphoresis.  HENT: Negative for nosebleeds, sore throat, facial swelling, mouth sores, trouble swallowing and ear discharge.  Eyes: Negative for photophobia, discharge and visual disturbance.  Respiratory: Negative for choking, chest tightness, shortness of breath and stridor.  Cardiovascular: Negative for chest pain and palpitations.  Gastrointestinal: Positive for abdominal pain. Negative for nausea, vomiting, constipation, blood in stool, abdominal distention, anal bleeding and rectal pain.  Genitourinary: Negative for dysuria, urgency, difficulty urinating and testicular pain.  Musculoskeletal: Negative for myalgias, back pain, arthralgias and gait problem.  Skin: Negative for color change, pallor, rash and wound.  Neurological: Negative for dizziness, speech difficulty, weakness, numbness and headaches.  Hematological: Negative for adenopathy. Does not bruise/bleed easily.  Psychiatric/Behavioral: Negative for hallucinations, confusion and agitation.  Objective:   Physical Exam  Constitutional: He is oriented to person, place, and time. He appears well-developed and well-nourished. No distress.  HENT:  Head: Normocephalic.  Mouth/Throat: Oropharynx is clear and moist. No oropharyngeal exudate.  Eyes: Conjunctivae normal and EOM are normal. Pupils are equal, round, and reactive to light. No scleral icterus.  Neck: Normal range of motion. Neck supple. No tracheal deviation present.  Cardiovascular: Normal rate, regular rhythm and intact  distal pulses.  Pulmonary/Chest: Effort normal and breath sounds normal. No respiratory distress.  Abdominal: Soft. He exhibits no distension. There is no tenderness. There is no rigidity, no guarding and no CVA tenderness. A hernia is present. Hernia confirmed positive in the ventral area. Hernia confirmed negative in the right inguinal area and confirmed negative in the left inguinal area.   Diastasis recti.  Small umb hernia Musculoskeletal: Normal range of motion. He exhibits no tenderness.  Lymphadenopathy:  He has no cervical adenopathy.  Right: No inguinal adenopathy present.  Left: No inguinal adenopathy present.  Neurological: He is alert and oriented to person,  place, and time. No cranial nerve deficit. He exhibits normal muscle tone. Coordination normal.  Skin: Skin is warm and dry. No rash noted. He is not diaphoretic. No erythema. No pallor.  Psychiatric: He has a normal mood and affect. His behavior is normal. Judgment and thought content normal.   Assessment:   Recurrent diverticulitis   Plan:   Given the fact that he has had over four attacks and now he has had persistent inflammation requiring constant antibiotics, I think he is at the point of end-stage recurrent diverticulitis towards segmental sigmoid colonic resection. He and his wife are inclined to agree. I long discussion with him and discussing the procedure:   The anatomy & physiology of the digestive tract was discussed. The pathophysiology was discussed. Natural history risks without surgery was discussed. I feel the risks of no intervention will lead to serious problems that outweigh the operative risks; therefore, I recommended a partial colectomy to remove the pathology. Laparoscopic & open techniques were discussed.  Risks such as bleeding, infection, abscess, leak, reoperation, possible ostomy, hernia, heart attack, death, and other risks were discussed. I noted a good likelihood this will help address the  problem. Goals of post-operative recovery were discussed as well. We will work to minimize complications. An educational handout on the pathology was given as well. Questions were answered. The patient expresses understanding & wishes to proceed with surgery.  I am concerned about the health of the patient and the ability to tolerate the operation Given his past history of SVT. Therefore, we will request clearance by cardiology to better assess operative risk & see if a reevaluation, further workup, adjustment to medications, etc is needed. Hopefully all he needs is a letter from Dr. Antoine Poche since he has seen the patient a few times this year and things have been stable. Nonetheless, I want to be safe...  He has a small hernia in his bellybutton. Probably can just be primarily repaired. We will plan for placement through the hernia. Avoid mesh given the fact that doing a colectomy. The anatomy & physiology of the abdominal wall was discussed. The pathophysiology of hernias was discussed. Natural history risks without surgery including progeressive enlargement, pain, incarceration & strangulation was discussed. Contributors to complications such as smoking, obesity, diabetes, prior surgery, etc were discussed.  I feel the risks of no intervention will lead to serious problems that outweigh the operative risks; therefore, I recommended surgery to reduce and repair the hernia. I explained laparoscopic techniques with possible need for an open approach. I noted the probable use of mesh to patch and/or buttress the hernia repair  Risks such as bleeding, infection, abscess, need for further treatment, heart attack, death, and other risks were discussed. I noted a good likelihood this will help address the problem. Goals of post-operative recovery were discussed as well. Possibility that this will not correct all symptoms was explained. I stressed the importance of low-impact activity, aggressive pain control,  avoiding constipation, & not pushing through pain to minimize risk of post-operative chronic pain or injury. Possibility of reherniation especially with smoking, obesity, diabetes, immunosuppression, and other health conditions was discussed. We will work to minimize complications.  An educational handout further explaining the pathology & treatment options was given as well. Questions were answered. The patient expresses understanding & wishes to proceed with surgery.   I have re-reviewed the the patient's records, history, medications, and allergies.  I have re-examined the patient.  I again discussed intraoperative plans and goals of  post-operative recovery.  The patient agrees to proceed.   07/23/2012  CARE TEAM:  PCP: Rudi Heap, MD  Outpatient Care Team: Patient Care Team: Ernestina Penna, MD as PCP - General (Family Medicine) Iva Boop, MD as Consulting Physician (Gastroenterology) [name], MD as Consulting Physician (Cardiology)  Inpatient Treatment Team: Treatment Team: Attending Provider: Ardeth Sportsman, MD

## 2012-07-24 ENCOUNTER — Encounter (HOSPITAL_COMMUNITY): Payer: Self-pay | Admitting: Surgery

## 2012-07-24 DIAGNOSIS — G4733 Obstructive sleep apnea (adult) (pediatric): Secondary | ICD-10-CM | POA: Diagnosis present

## 2012-07-24 DIAGNOSIS — R0602 Shortness of breath: Secondary | ICD-10-CM | POA: Insufficient documentation

## 2012-07-24 LAB — BASIC METABOLIC PANEL
BUN: 10 mg/dL (ref 6–23)
Calcium: 8.8 mg/dL (ref 8.4–10.5)
Chloride: 102 mEq/L (ref 96–112)
Creatinine, Ser: 0.85 mg/dL (ref 0.50–1.35)
GFR calc Af Amer: >90 mL/min (ref >90)
GFR calc non Af Amer: 85 mL/min — ABNORMAL LOW (ref >90)

## 2012-07-24 LAB — CBC
HCT: 38.4 % — ABNORMAL LOW (ref 39.0–52.0)
MCHC: 34.4 g/dL (ref 30.0–36.0)
Platelets: 223 10*3/uL (ref 150–400)
RDW: 13 % (ref 11.5–15.5)
WBC: 11.8 10*3/uL — ABNORMAL HIGH (ref 4.0–10.5)

## 2012-07-24 LAB — GLUCOSE, CAPILLARY: Glucose-Capillary: 124 mg/dL — ABNORMAL HIGH (ref 70–99)

## 2012-07-24 MED ORDER — SODIUM CHLORIDE 0.9 % IJ SOLN
3.0000 mL | Freq: Two times a day (BID) | INTRAMUSCULAR | Status: DC
  Administered 2012-07-24 – 2012-07-25 (×3): 3 mL via INTRAVENOUS

## 2012-07-24 MED ORDER — ZOLPIDEM TARTRATE 5 MG PO TABS
5.0000 mg | ORAL_TABLET | Freq: Every evening | ORAL | Status: DC | PRN
  Administered 2012-07-24 – 2012-07-26 (×2): 5 mg via ORAL
  Filled 2012-07-24 (×2): qty 1

## 2012-07-24 MED ORDER — ENSURE COMPLETE PO LIQD
237.0000 mL | Freq: Two times a day (BID) | ORAL | Status: DC
  Administered 2012-07-24 – 2012-07-25 (×3): 237 mL via ORAL

## 2012-07-24 MED ORDER — SODIUM CHLORIDE 0.9 % IJ SOLN: 3.0000 mL | INTRAMUSCULAR | Status: DC | PRN

## 2012-07-24 NOTE — Progress Notes (Signed)
Set patient up on CPAP with home full face mask and tubing. 2L of oxygen bled in. Settings per home 6cmH2O min, to 20 cmH2O max. Sterile water added to fill line. Patient states he is comfortable and will call if he requires further assistance.

## 2012-07-24 NOTE — Progress Notes (Signed)
INITIAL NUTRITION ASSESSMENT  DOCUMENTATION CODES Per approved criteria  -Obesity Unspecified   INTERVENTION: - Ensure Complete BID - Will continue to monitor   NUTRITION DIAGNOSIS: Increased nutrient needs related to recent surgery as evidenced by POD#1 colectomy, hernia repair.   Goal: Pt to consume >90% of meals/supplements  Monitor:  Weights, labs, intake, diarhea  Reason for Assessment: Nutrition risk   72 y.o. male  Admitting Dx: Diverticulitis  ASSESSMENT: Pt reports PTA he was consuming only liquids for 30 days and eating things like popsicles, jello, and broth. Pt reports for 1.5 weeks PTA he was on a low residue diet and was consuming 3 meals/day. Pt reports 4 months ago he weighed 278 pounds and he dropped down to 240 pounds however he has been gaining weight recently. Pt normally has 2-3 bowel movements/day recently more loose because of antibiotics. Pt on probiotics at home. Pt reports doing well today, tolerated full liquid breakfast and consumed 100% of tray of soup and yogurt. Pt is POD#1 colectomy, proctoscopy, and adult hernia repair.    Height: Ht Readings from Last 1 Encounters:  07/23/12 6\' 1"  (1.854 m)    Weight: Wt Readings from Last 1 Encounters:  07/24/12 263 lb 0.1 oz (119.3 kg)    Ideal Body Weight: 184 lb  % Ideal Body Weight: 143  Wt Readings from Last 10 Encounters:  07/24/12 263 lb 0.1 oz (119.3 kg)  07/24/12 263 lb 0.1 oz (119.3 kg)  07/15/12 255 lb (115.667 kg)  06/10/12 254 lb 8 oz (115.44 kg)  06/02/12 257 lb (116.574 kg)  02/18/12 263 lb (119.296 kg)  08/12/11 266 lb (120.657 kg)  05/21/11 260 lb (117.935 kg)  05/16/11 275 lb 2.2 oz (124.8 kg)  05/16/11 275 lb 2.2 oz (124.8 kg)    Usual Body Weight: 278 lb  % Usual Body Weight: 95  BMI:  Body mass index is 34.70 kg/(m^2). Class I obesity  Estimated Nutritional Needs: Kcal: 2000-2400 Protein: 120-130g Fluid: 2-2.4L/day  Skin: Surgical incision from colectomy and  hernia repair  Diet Order: Full Liquid  EDUCATION NEEDS: -No education needs identified at this time   Intake/Output Summary (Last 24 hours) at 07/24/12 1102 Last data filed at 07/24/12 0900  Gross per 24 hour  Intake 3358.75 ml  Output   3900 ml  Net -541.25 ml    Last BM: 1/24, diarrhea  Labs:   Lab 07/24/12 0406 07/23/12 1552  NA 136 --  K 4.2 --  CL 102 --  CO2 26 --  BUN 10 --  CREATININE 0.85 0.99  CALCIUM 8.8 --  MG -- --  PHOS -- --  GLUCOSE 139* --    CBG (last 3)  No results found for this basename: GLUCAP:3 in the last 72 hours  Scheduled Meds:   . acetaminophen  1,000 mg Oral TID  . alvimopan  12 mg Oral BID  . antiseptic oral rinse  15 mL Mouth Rinse BID  . aspirin  162 mg Oral Daily  . heparin subcutaneous  5,000 Units Subcutaneous Q8H  . lip balm  1 application Topical BID  . metoprolol  25 mg Oral BID  . pantoprazole  40 mg Oral Daily  . saccharomyces boulardii  250 mg Oral BID  . Tamsulosin HCl  0.4 mg Oral QHS    Continuous Infusions:   . bupivacaine 0.25 % ON-Q pump DUAL CATH 300 mL    . lactated ringers 75 mL/hr at 07/24/12 0550    Past Medical History  Diagnosis Date  . Hyperlipidemia   . Musculoskeletal pain     in the right shoulder - S/P ROTATOR CUFF REPAIR-BUT STILL HAS PAIN  . Laceration  01/11/2009     Deep laceration left index finger dorsally  . Diverticulosis   . Carotid stenosis   . Exogenous obesity   . Nasal congestion   . Thyroiditis   . Diverticulitis   . Benign prostatic hypertrophy   . Arthritis   . GERD (gastroesophageal reflux disease)   . Rectal bleeding     PT ATTRIBUTES TO HEMORRHOIDS  . Difficulty urinating   . Tachyarrhythmia   . Obstructive sleep apnea      continuous positive airway pressure  AUTO SET 6 TO 20 - USUALLY SETTLES OUT AT 10  . Hernia     BELLY BUTTON  . Ulcer 2008  . H/O hiatal hernia   . SUPRAVENTRICULAR TACHYCARDIA 08/01/2009    Qualifier: Diagnosis of  By: Cristela Felt, CNA, Christy     . Rotator cuff (capsule) sprain 05/17/2011    Past Surgical History  Procedure Date  . Inguinal hernia repair   . Kidney stone surgery   . Tonsillectomy   . Colonoscopy     for polyps  . Rotator cuff repair   . Knee surgery   . Elbow bursa surgery   . Laparoscopic sigmoid colectomy 07/23/2012    Procedure: LAPAROSCOPIC SIGMOID COLECTOMY;  Surgeon: Ardeth Sportsman, MD;  Location: [facility];  Service: General;  Laterality: N/A;  Laparoscopic Sigmoid Colectomy  . Umbilical hernia repair 07/23/2012    Procedure: HERNIA REPAIR UMBILICAL ADULT;  Surgeon: Ardeth Sportsman, MD;  Location: [facility];  Service: General;  Laterality: N/A;  Primary Umbilical Hernia Repair  . Proctoscopy 07/23/2012    Procedure: PROCTOSCOPY;  Surgeon: Ardeth Sportsman, MD;  Location: [facility];  Service: General;  Laterality: N/A;  Rigid Proctoscopy    Levon Hedger MS, RD, LDN [phone] Pager [phone] After Hours Pager

## 2012-07-24 NOTE — Progress Notes (Signed)
Set patient up on CPAP with home tubing and home full face mask. 2L of oxygen bled in, 10cm H2O min, 20cm H2O. Sterile water added to fill line. Patient states he is comfortable and aware to call if he needs any assistance.

## 2012-07-24 NOTE — Progress Notes (Signed)
Cristian Moore [id number] 09/27/1940   Subjective:  No major events Walking in hallways +flatus/Bm No nausea Tolerating full liquids  Objective:  Vital signs:  Filed Vitals:   07/23/12 2246 07/24/12 0036 07/24/12 0204 07/24/12 0540  BP: 146/84  147/67 124/57  Pulse: 68 75 82 74  Temp: 97.4 F (36.3 C)  98.1 F (36.7 C) 97.9 F (36.6 C)  TempSrc: Oral     Resp: 18 18 20 18   Height:      Weight:   263 lb 0.1 oz (119.3 kg)   SpO2: 100% 97% 98% 96%    Last BM Date: 07/24/12  Intake/Output   Yesterday:  01/23 0701 - 01/24 0700 In: 4098.1 [I.V.:3878.8] Out: 3300 [Urine:3250; Blood:50] This shift:     Bowel function:  Flatus: ?  BM: Small  Physical Exam:  General: Pt awake/alert/oriented x4 in no acute distress.  Wife in room Eyes: PERRL, normal EOM.  Sclera clear.  No icterus Neuro: CN II-XII intact w/o focal sensory/motor deficits. Lymph: No head/neck/groin lymphadenopathy Psych:  No delerium/psychosis/paranoia HENT: Normocephalic, Mucus membranes moist.  No thrush Neck: Supple, No tracheal deviation Chest: No chest wall pain w good excursion CV:  Pulses intact.  Regular rhythm MS: Normal AROM mjr joints.  No obvious deformity Abdomen: Soft.  Nondistended.  Dressing c/d/i.  OnQ in place.  Mildly tender at incisions only.  No incarcerated hernias. Ext:  SCDs BLE.  No mjr edema.  No cyanosis Skin: No petechiae / purpurae  Problem List:  Principal Problem:  *Diverticulitis, recurrent Active Problems:  Obesity (BMI 30-39.9)  CAROTID ARTERY DISEASE  Obstructive sleep apnea   Assessment  Cristian Moore  72 y.o. male  1 Day Post-Op  PROCEDURES:  LAPAROSCOPIC SIGMOID COLECTOMY  LAPAROSCOPIC MOBILIZATION OF SPLENIC FLEXURE OF COLON  HERNIA REPAIR UMBILICAL ADULT  PROCTOSCOPY   Stable  Plan:  -full liquids - adv to bland diet in AM if still progressing well -B blocker for HTN &  H/o SVT -PPI for GERD -f/u pathology -VTE prophylaxis- SCDs,  etc -mobilize as tolerated to help recovery  Ardeth Sportsman, M.D., F.A.C.S. Gastrointestinal and Minimally Invasive Surgery [facility] [address] [place], [place] [address] [phone] Main / Paging [phone] Voice Mail   07/24/2012  CARE TEAM:  PCP: Rudi Heap, MD  Outpatient Care Team: Patient Care Team: Ernestina Penna, MD as PCP - General (Family Medicine) Iva Boop, MD as Consulting Physician (Gastroenterology) [name], MD as Consulting Physician (Cardiology)  Inpatient Treatment Team: Treatment Team: Attending Provider: Ardeth Sportsman, MD; Registered Nurse: Tristan Schroeder, RN; Technician: Dione Booze, NT; Technician: Areta Haber, NT; Registered Nurse: Leonie Douglas, RN; Technician: Calton Golds, NT   Results:   Labs: Results for orders placed during the hospital encounter of 07/23/12 (from the past 48 hour(s))  ABO/RH     Status: Normal   Collection Time   07/23/12  8:00 AM      Component Value Range Comment   ABO/RH(D) O POS     TYPE AND SCREEN     Status: Normal   Collection Time   07/23/12  8:15 AM      Component Value Range Comment   ABO/RH(D) O POS      Antibody Screen NEG      Sample Expiration 07/26/2012     CBC     Status: Abnormal   Collection Time   07/23/12  3:52 PM  Component Value Range Comment   WBC 17.0 (*) 4.0 - 10.5 K/uL    RBC 4.43  4.22 - 5.81 MIL/uL    Hemoglobin 14.1  13.0 - 17.0 g/dL    HCT 40.9  81.1 - 91.4 %    MCV 93.7  78.0 - 100.0 fL    MCH 31.8  26.0 - 34.0 pg    MCHC 34.0  30.0 - 36.0 g/dL    RDW 78.2  95.6 - 21.3 %    Platelets 217  150 - 400 K/uL   CREATININE, SERUM     Status: Abnormal   Collection Time   07/23/12  3:52 PM      Component Value Range Comment   Creatinine, Ser 0.99  0.50 - 1.35 mg/dL    GFR calc non Af Amer 80 (*) >90 mL/min    GFR calc Af Amer >90  >90 mL/min   BASIC METABOLIC PANEL     Status:  Abnormal   Collection Time   07/24/12  4:06 AM      Component Value Range Comment   Sodium 136  135 - 145 mEq/L    Potassium 4.2  3.5 - 5.1 mEq/L    Chloride 102  96 - 112 mEq/L    CO2 26  19 - 32 mEq/L    Glucose, Bld 139 (*) 70 - 99 mg/dL    BUN 10  6 - 23 mg/dL    Creatinine, Ser 0.86  0.50 - 1.35 mg/dL    Calcium 8.8  8.4 - 57.8 mg/dL    GFR calc non Af Amer 85 (*) >90 mL/min    GFR calc Af Amer >90  >90 mL/min   CBC     Status: Abnormal   Collection Time   07/24/12  4:06 AM      Component Value Range Comment   WBC 11.8 (*) 4.0 - 10.5 K/uL    RBC 4.14 (*) 4.22 - 5.81 MIL/uL    Hemoglobin 13.2  13.0 - 17.0 g/dL    HCT 46.9 (*) 62.9 - 52.0 %    MCV 92.8  78.0 - 100.0 fL    MCH 31.9  26.0 - 34.0 pg    MCHC 34.4  30.0 - 36.0 g/dL    RDW 52.8  41.3 - 24.4 %    Platelets 223  150 - 400 K/uL     Imaging / Studies: No results found.  Medications / Allergies: per chart  Antibiotics: Anti-infectives     Start     Dose/Rate Route Frequency Ordered Stop   07/23/12 1600   clindamycin (CLEOCIN) IVPB 600 mg        600 mg 100 mL/hr over 30 Minutes Intravenous 4 times per day 07/23/12 1518 07/24/12 0610   07/23/12 1015   clindamycin (CLEOCIN) 900 mg, gentamicin (GARAMYCIN) 240 mg in sodium chloride 0.9 % 1,000 mL for intraperitoneal lavage         Irrigation To Surgery 07/23/12 1009 07/24/12 1015   07/23/12 0730   clindamycin (CLEOCIN) IVPB 900 mg        900 mg 100 mL/hr over 30 Minutes Intravenous On call to O.R. 07/23/12 0722 07/23/12 1030   07/23/12 0600   gentamicin (GARAMYCIN) 470 mg in dextrose 5 % 50 mL IVPB     Comments: Pharmacy may adjust dosing strength, schedule, rate of infusion, etc as needed to optimize therapy      470 mg 123.5 mL/hr over 30 Minutes Intravenous On call to  O.R. 07/22/12 1659 07/23/12 1049

## 2012-07-25 MED ORDER — HYDROMORPHONE HCL PF 1 MG/ML IJ SOLN: 0.5000 mg | INTRAMUSCULAR | Status: DC | PRN

## 2012-07-25 MED ORDER — ALVIMOPAN 12 MG PO CAPS
12.0000 mg | ORAL_CAPSULE | Freq: Two times a day (BID) | ORAL | Status: DC
  Administered 2012-07-25 (×2): 12 mg via ORAL
  Filled 2012-07-25 (×4): qty 1

## 2012-07-25 MED ORDER — OXYCODONE HCL 5 MG PO TABS
5.0000 mg | ORAL_TABLET | ORAL | Status: DC | PRN
  Administered 2012-07-25 (×2): 5 mg via ORAL
  Administered 2012-07-25 – 2012-07-26 (×2): 10 mg via ORAL
  Filled 2012-07-25 (×2): qty 2
  Filled 2012-07-25 (×2): qty 1

## 2012-07-25 NOTE — Progress Notes (Signed)
Was assisting the pt to the restroom and observed pt's foley, noticing pt's urine was red in color. MD on Call made aware. Informed MD that the pt's foley is scheduled to be removed in the morning. No new orders received at this time.

## 2012-07-25 NOTE — Progress Notes (Signed)
Cristian Moore [id number] July 13, 1940   Subjective:  No major events Walking in hallways +flatus/Bm No nausea Tolerating full liquids - trying solids this AM -?hematuria w Foley yest - not now  Objective:  Vital signs:  Filed Vitals:   07/24/12 1400 07/24/12 1430 07/24/12 2100 07/25/12 0510  BP: 115/75  105/64 136/79  Pulse: 68  66 73  Temp: 98.1 F (36.7 C)  97.9 F (36.6 C) 97.9 F (36.6 C)  TempSrc: Oral  Oral Oral  Resp: 22  18 18   Height:      Weight:  259 lb 11.2 oz (117.8 kg)    SpO2: 98%  96% 96%    Last BM Date:  (07/25/2012)  Intake/Output   Yesterday:  01/24 0701 - 01/25 0700 In: 2127.5 [P.O.:1200; I.V.:927.5] Out: 5900 [Urine:5900] This shift:  Total I/O In: -  Out: 150 [Urine:150]  Bowel function:  Flatus: Yes  BM: x5  Physical Exam:  General: Pt awake/alert/oriented x4 in no acute distress.  Wife in room Eyes: PERRL, normal EOM.  Sclera clear.  No icterus Neuro: CN II-XII intact w/o focal sensory/motor deficits. Lymph: No head/neck/groin lymphadenopathy Psych:  No delerium/psychosis/paranoia HENT: Normocephalic, Mucus membranes moist.  No thrush Neck: Supple, No tracheal deviation Chest: No chest wall pain w good excursion CV:  Pulses intact.  Regular rhythm MS: Normal AROM mjr joints.  No obvious deformity Abdomen: Soft.  Nondistended.  Dressing c/d/i.  OnQ in place.  Mildly tender at incisions only.  No incarcerated hernias. Ext:  SCDs BLE.  No mjr edema.  No cyanosis Skin: No petechiae / purpurae  Problem List:  Principal Problem:  *Diverticulitis, recurrent Active Problems:  Obesity (BMI 30-39.9)  CAROTID ARTERY DISEASE  Obstructive sleep apnea   Assessment  Cristian Moore  72 y.o. male  2 Days Post-Op  PROCEDURES:  LAPAROSCOPIC SIGMOID COLECTOMY  LAPAROSCOPIC MOBILIZATION OF SPLENIC FLEXURE OF COLON  HERNIA REPAIR UMBILICAL ADULT  PROCTOSCOPY   Improving  Plan:  -bland diet -switch to PO pain control -B  blocker for HTN &  H/o SVT -PPI for GERD -f/u pathology -VTE prophylaxis- SCDs, etc -mobilize as tolerated to help recovery  D/C patient from hospital when patient meets criteria (prob tomorrow):  Tolerating oral intake well Ambulating in walkways Adequate pain control without IV medications Urinating  Having flatus   Ardeth Sportsman, M.D., F.A.C.S. Gastrointestinal and Minimally Invasive Surgery [facility] [address] [place], [place] [address] [phone] Main / Paging [phone] Voice Mail   07/25/2012  CARE TEAM:  PCP: Rudi Heap, MD  Outpatient Care Team: Patient Care Team: Ernestina Penna, MD as PCP - General (Family Medicine) Iva Boop, MD as Consulting Physician (Gastroenterology) [name], MD as Consulting Physician (Cardiology)  Inpatient Treatment Team: Treatment Team: Attending Provider: Ardeth Sportsman, MD; Registered Nurse: Tristan Schroeder, RN; Technician: Dione Booze, NT; Technician: Areta Haber, NT; Registered Nurse: Leonie Douglas, RN; Technician: Calton Golds, NT; Dietitian: Lavena Bullion, RD; Registered Nurse: Jonathon Bellows, RN   Results:   Labs: Results for orders placed during the hospital encounter of 07/23/12 (from the past 48 hour(s))  CBC     Status: Abnormal   Collection Time   07/23/12  3:52 PM      Component Value Range Comment   WBC 17.0 (*) 4.0 - 10.5 K/uL    RBC 4.43  4.22 - 5.81 MIL/uL    Hemoglobin 14.1  13.0 - 17.0 g/dL    HCT 16.1  09.6 - 04.5 %    MCV 93.7  78.0 - 100.0 fL    MCH 31.8  26.0 - 34.0 pg    MCHC 34.0  30.0 - 36.0 g/dL    RDW 40.9  81.1 - 91.4 %    Platelets 217  150 - 400 K/uL   CREATININE, SERUM     Status: Abnormal   Collection Time   07/23/12  3:52 PM      Component Value Range Comment   Creatinine, Ser 0.99  0.50 - 1.35 mg/dL    GFR calc non Af Amer 80 (*) >90 mL/min    GFR calc Af Amer >90  >90 mL/min     BASIC METABOLIC PANEL     Status: Abnormal   Collection Time   07/24/12  4:06 AM      Component Value Range Comment   Sodium 136  135 - 145 mEq/L    Potassium 4.2  3.5 - 5.1 mEq/L    Chloride 102  96 - 112 mEq/L    CO2 26  19 - 32 mEq/L    Glucose, Bld 139 (*) 70 - 99 mg/dL    BUN 10  6 - 23 mg/dL    Creatinine, Ser 7.82  0.50 - 1.35 mg/dL    Calcium 8.8  8.4 - 95.6 mg/dL    GFR calc non Af Amer 85 (*) >90 mL/min    GFR calc Af Amer >90  >90 mL/min   CBC     Status: Abnormal   Collection Time   07/24/12  4:06 AM      Component Value Range Comment   WBC 11.8 (*) 4.0 - 10.5 K/uL    RBC 4.14 (*) 4.22 - 5.81 MIL/uL    Hemoglobin 13.2  13.0 - 17.0 g/dL    HCT 21.3 (*) 08.6 - 52.0 %    MCV 92.8  78.0 - 100.0 fL    MCH 31.9  26.0 - 34.0 pg    MCHC 34.4  30.0 - 36.0 g/dL    RDW 57.8  46.9 - 62.9 %    Platelets 223  150 - 400 K/uL   GLUCOSE, CAPILLARY     Status: Abnormal   Collection Time   07/24/12  6:47 PM      Component Value Range Comment   Glucose-Capillary 124 (*) 70 - 99 mg/dL    Comment 1 Notify RN      Comment 2 Documented in Chart       Imaging / Studies: No results found.  Medications / Allergies: per chart  Antibiotics: Anti-infectives     Start     Dose/Rate Route Frequency Ordered Stop   07/23/12 1600   clindamycin (CLEOCIN) IVPB 600 mg        600 mg 100 mL/hr over 30 Minutes Intravenous 4 times per day 07/23/12 1518 07/24/12 0610   07/23/12 1015   clindamycin (CLEOCIN) 900 mg, gentamicin (GARAMYCIN) 240 mg in sodium chloride 0.9 % 1,000 mL for intraperitoneal lavage         Irrigation To Surgery 07/23/12 1009 07/24/12 1015   07/23/12 0730   clindamycin (CLEOCIN) IVPB 900 mg        900 mg 100 mL/hr over 30 Minutes Intravenous On call to O.R. 07/23/12 0722 07/23/12 1030   07/23/12 0600   gentamicin (GARAMYCIN) 470 mg in dextrose 5 % 50 mL IVPB     Comments: Pharmacy may  adjust dosing strength, schedule, rate of infusion, etc as needed to optimize therapy       470 mg 123.5 mL/hr over 30 Minutes Intravenous On call to O.R. 07/22/12 1659 07/23/12 1049

## 2012-07-26 NOTE — Discharge Summary (Signed)
Physician Discharge Summary  Patient ID: Cristian Moore MRN: [id number] DOB/AGE: May 30, 1941 72 y.o.  Admit date: 07/23/2012 Discharge date: 07/26/2012  Admission Diagnoses: Principal Problem:  *Diverticulitis, recurrent Active Problems:  Obesity (BMI 30-39.9)  CAROTID ARTERY DISEASE  Obstructive sleep apnea  Discharge Diagnoses:  Principal Problem:  *Diverticulitis, recurrent Active Problems:  Obesity (BMI 30-39.9)  CAROTID ARTERY DISEASE  Obstructive sleep apnea   Discharged Condition: good  Hospital Course: Pleasant male with recurrent episodes of diverticulitis of descending and sigmoid colon.  Underwent laparoscopically assisted resection.  Postoperatively, the patient was placed on an anti-ileus protocol.  The patient mobilized and advanced to a solid diet gradually.  Pain was well-controlled and transitioned off IV medications.    By the time of discharge, the patient was walking well the hallways, eating food well, having flatus.  Pain was-controlled on an oral regimen.  Based on meeting DC criteria and recovering well, I felt it was safe for the patient to be discharged home with close followup.  Instructions were discussed in detail to the patient and his wife.  They are written as well.     Consults: None  Significant Diagnostic Studies:   Treatments: surgery: LAPAROSCOPIC SIGMOID COLECTOMY  LAPAROSCOPIC MOBILIZATION OF SPLENIC FLEXURE OF COLON  HERNIA REPAIR UMBILICAL ADULT  PROCTOSCOPY      Discharge Exam: Blood pressure 123/81, pulse 70, temperature 97.6 F (36.4 C), temperature source Oral, resp. rate 17, height 6\' 1"  (1.854 m), weight 257 lb 8 oz (116.8 kg), SpO2 97.00%. General: Pt awake/alert/oriented x4 in  no major acute distress Eyes: PERRL, normal EOM. Sclera nonicteric Neuro: CN II-XII intact w/o focal sensory/motor deficits. Lymph: No head/neck/groin lymphadenopathy Psych:  No delerium/psychosis/paranoia HENT: Normocephalic, Mucus membranes  moist.  No thrush Neck: Supple, No tracheal deviation Chest: No pain.  Good respiratory excursion. CV:  Pulses intact.   Regular rhythm MS: Normal AROM mjr joints.  No obvious deformity Abdomen: Soft, Min distended.  Incisions clean  Nontender.  No incarcerated hernias. Ext:  SCDs BLE.  No significant edema.  No cyanosis Skin: No petechiae / purpurae   Disposition: 01-Home or Self Care  Discharge Orders    Future Appointments: Provider: Department: Dept Phone: Center:   08/10/2012 10:00 AM Ardeth Sportsman, MD [facility] [place] [phone] None       Medication List     As of 07/26/2012  8:56 AM    TAKE these medications         aspirin 325 MG tablet   Take 162.5 mg by mouth daily.      CARAFATE PO   Take 1 g by mouth 2 (two) times daily.      CENTRUM SILVER PO   Take 1 tablet by mouth daily.      cholecalciferol 1000 UNITS tablet   Commonly known as: VITAMIN D   Take 2,000 Units by mouth daily.      Co Q-10 100 MG Caps   Take 100 mg by mouth daily.      fish oil-omega-3 fatty acids 1000 MG capsule   Take 1 g by mouth 2 (two) times daily.      metoprolol 50 MG tablet   Commonly known as: LOPRESSOR   Take 0.5 tablets (25 mg total) by mouth 2 (two) times daily.      metroNIDAZOLE 500 MG tablet   Commonly known as: FLAGYL   Take 1 tablet (500 mg total) by mouth as directed. Take 2 pills (=1000mg ) by mouth at  1pm, 3pm, and 10pm the day before your colorectal operation      neomycin 500 MG tablet   Commonly known as: MYCIFRADIN   Take 2 tablets (1,000 mg total) by mouth as directed. Take 2 pills (=1000mg ) by mouth at 1pm, 3pm, and 10pm the day before your colorectal operation      NITROSTAT 0.4 MG SL tablet   Generic drug: nitroGLYCERIN   Place 0.4 mg under the tongue every 5 (five) minutes as needed. Chest pains      omeprazole 20 MG capsule   Commonly known as: PRILOSEC   Take 20 mg by mouth. EVERY AM BEFORE BREAKFAST      OVER THE COUNTER  MEDICATION   Take 1 capsule by mouth daily. Digestive advantage      oxyCODONE 5 MG immediate release tablet   Commonly known as: Oxy IR/ROXICODONE   Take 1-2 tablets (5-10 mg total) by mouth every 4 (four) hours as needed for pain.      oxymetazoline 0.05 % nasal spray   Commonly known as: AFRIN   Place 2 sprays into the nose as needed. Nasal congestion      simvastatin 20 MG tablet   Commonly known as: ZOCOR   Take 20 mg by mouth at bedtime.      Tamsulosin HCl 0.4 MG Caps   Commonly known as: FLOMAX   Take 0.4 mg by mouth at bedtime.           Follow-up Information    Follow up with Rhandi Despain C., MD. In 3 weeks.   Contact information:   [address] [place] [address] [phone]          Signed: Ardeth Sportsman. 07/26/2012, 8:56 AM

## 2012-07-26 NOTE — Progress Notes (Signed)
Patient discharged via wheelchair. rx for oxycodone given. Patient states understanding of discharge instructions. Drsg, wicks, and on-Q pump removed per md order.

## 2012-07-29 ENCOUNTER — Telehealth (INDEPENDENT_AMBULATORY_CARE_PROVIDER_SITE_OTHER): Payer: Self-pay

## 2012-07-29 NOTE — Telephone Encounter (Signed)
Called pt to notify him that we received the path report back showing diverticulosis/itis with no cancer per Dr Michaell Cowing. The pt understands.

## 2012-08-10 ENCOUNTER — Ambulatory Visit (INDEPENDENT_AMBULATORY_CARE_PROVIDER_SITE_OTHER): Payer: Medicare Other | Admitting: Surgery

## 2012-08-10 ENCOUNTER — Encounter (INDEPENDENT_AMBULATORY_CARE_PROVIDER_SITE_OTHER): Payer: Self-pay | Admitting: Surgery

## 2012-08-10 VITALS — BP 134/82 | HR 64 | Temp 97.6°F | Resp 16 | Ht 73.0 in | Wt 253.6 lb

## 2012-08-10 DIAGNOSIS — K5732 Diverticulitis of large intestine without perforation or abscess without bleeding: Secondary | ICD-10-CM

## 2012-08-10 DIAGNOSIS — K5792 Diverticulitis of intestine, part unspecified, without perforation or abscess without bleeding: Secondary | ICD-10-CM

## 2012-08-10 NOTE — Patient Instructions (Addendum)
GETTING TO GOOD BOWEL HEALTH. Irregular bowel habits such as constipation and diarrhea can lead to many problems over time.  Having one soft bowel movement a day is the most important way to prevent further problems.  The anorectal canal is designed to handle stretching and feces to safely manage our ability to get rid of solid waste (feces, poop, stool) out of our body.  BUT, hard constipated stools can act like ripping concrete bricks and diarrhea can be a burning fire to this very sensitive area of our body, causing inflamed hemorrhoids, anal fissures, increasing risk is perirectal abscesses, abdominal pain/bloating, an making irritable bowel worse.     The goal: ONE SOFT BOWEL MOVEMENT A DAY!  To have soft, regular bowel movements:    Drink at least 8 tall glasses of water a day.     Take plenty of fiber.  Fiber is the undigested part of plant food that passes into the colon, acting s "natures broom" to encourage bowel motility and movement.  Fiber can absorb and hold large amounts of water. This results in a larger, bulkier stool, which is soft and easier to pass. Work gradually over several weeks up to 6 servings a day of fiber (25g a day even more if needed) in the form of: o Vegetables -- Root (potatoes, carrots, turnips), leafy green (lettuce, salad greens, celery, spinach), or cooked high residue (cabbage, broccoli, etc) o Fruit -- Fresh (unpeeled skin & pulp), Dried (prunes, apricots, cherries, etc ),  or stewed ( applesauce)  o Whole grain breads, pasta, etc (whole wheat)  o Bran cereals    Bulking Agents -- This type of water-retaining fiber generally is easily obtained each day by one of the following:  o Psyllium bran -- The psyllium plant is remarkable because its ground seeds can retain so much water. This product is available as Metamucil, Konsyl, Effersyllium, Per Diem Fiber, or the less expensive generic preparation in drug and health food stores. Although labeled a laxative, it really  is not a laxative.  o Methylcellulose -- This is another fiber derived from wood which also retains water. It is available as Citrucel. o Polyethylene Glycol - and "artificial" fiber commonly called Miralax or Glycolax.  It is helpful for people with gassy or bloated feelings with regular fiber o Flax Seed - a less gassy fiber than psyllium   No reading or other relaxing activity while on the toilet. If bowel movements take longer than 5 minutes, you are too constipated   AVOID CONSTIPATION.  High fiber and water intake usually takes care of this.  Sometimes a laxative is needed to stimulate more frequent bowel movements, but    Laxatives are not a good long-term solution as it can wear the colon out. o Osmotics (Milk of Magnesia, Fleets phosphosoda, Magnesium citrate, MiraLax, GoLytely) are safer than  o Stimulants (Senokot, Castor Oil, Dulcolax, Ex Lax)    o Do not take laxatives for more than 7days in a row.    IF SEVERELY CONSTIPATED, try a Bowel Retraining Program: o Do not use laxatives.  o Eat a diet high in roughage, such as bran cereals and leafy vegetables.  o Drink six (6) ounces of prune or apricot juice each morning.  o Eat two (2) large servings of stewed fruit each day.  o Take one (1) heaping tablespoon of a psyllium-based bulking agent twice a day. Use sugar-free sweetener when possible to avoid excessive calories.  o Eat a normal breakfast.  o   Set aside 15 minutes after breakfast to sit on the toilet, but do not strain to have a bowel movement.  o If you do not have a bowel movement by the third day, use an enema and repeat the above steps.    Controlling diarrhea o Switch to liquids and simpler foods for a few days to avoid stressing your intestines further. o Avoid dairy products (especially milk & ice cream) for a short time.  The intestines often can lose the ability to digest lactose when stressed. o Avoid foods that cause gassiness or bloating.  Typical foods include  beans and other legumes, cabbage, broccoli, and dairy foods.  Every person has some sensitivity to other foods, so listen to our body and avoid those foods that trigger problems for you. o Adding fiber (Citrucel, Metamucil, psyllium, Miralax) gradually can help thicken stools by absorbing excess fluid and retrain the intestines to act more normally.  Slowly increase the dose over a few weeks.  Too much fiber too soon can backfire and cause cramping & bloating. o Probiotics (such as active yogurt, Align, etc) may help repopulate the intestines and colon with normal bacteria and calm down a sensitive digestive tract.  Most studies show it to be of mild help, though, and such products can be costly. o Medicines:   Bismuth subsalicylate (ex. Kayopectate, Pepto Bismol) every 30 minutes for up to 6 doses can help control diarrhea.  Avoid if pregnant.   Loperamide (Immodium) can slow down diarrhea.  Start with two tablets (4mg  total) first and then try one tablet every 6 hours.  Avoid if you are having fevers or severe pain.  If you are not better or start feeling worse, stop all medicines and call your doctor for advice o Call your doctor if you are getting worse or not better.  Sometimes further testing (cultures, endoscopy, X-ray studies, bloodwork, etc) may be needed to help diagnose and treat the cause of the diarrhea.  High-Fiber Diet Fiber is found in fruits, vegetables, and grains. A high-fiber diet encourages the addition of more whole grains, legumes, fruits, and vegetables in your diet. The recommended amount of fiber for adult males is 38 g per day. For adult females, it is 25 g per day. Pregnant and lactating women should get 28 g of fiber per day. If you have a digestive or bowel problem, ask your caregiver for advice before adding high-fiber foods to your diet. Eat a variety of high-fiber foods instead of only a select few type of foods.  PURPOSE  To increase stool bulk.  To make bowel  movements more regular to prevent constipation.  To lower cholesterol.  To prevent overeating. WHEN IS THIS DIET USED?  It may be used if you have constipation and hemorrhoids.  It may be used if you have uncomplicated diverticulosis (intestine condition) and irritable bowel syndrome.  It may be used if you need help with weight management.  It may be used if you want to add it to your diet as a protective measure against atherosclerosis, diabetes, and cancer. SOURCES OF FIBER  Whole-grain breads and cereals.  Fruits, such as apples, oranges, bananas, berries, prunes, and pears.  Vegetables, such as green peas, carrots, sweet potatoes, beets, broccoli, cabbage, spinach, and artichokes.  Legumes, such split peas, soy, lentils.  Almonds. FIBER CONTENT IN FOODS Starches and Grains / Dietary Fiber (g)  Cheerios, 1 cup / 3 g  Corn Flakes cereal, 1 cup / 0.7 g  Rice crispy  treat cereal, 1 cup / 0.3 g  Instant oatmeal (cooked),  cup / 2 g  Frosted wheat cereal, 1 cup / 5.1 g  Brown, long-grain rice (cooked), 1 cup / 3.5 g  White, long-grain rice (cooked), 1 cup / 0.6 g  Enriched macaroni (cooked), 1 cup / 2.5 g Legumes / Dietary Fiber (g)  Baked beans (canned, plain, or vegetarian),  cup / 5.2 g  Kidney beans (canned),  cup / 6.8 g  Pinto beans (cooked),  cup / 5.5 g Breads and Crackers / Dietary Fiber (g)  Plain or honey graham crackers, 2 squares / 0.7 g  Saltine crackers, 3 squares / 0.3 g  Plain, salted pretzels, 10 pieces / 1.8 g  Whole-wheat bread, 1 slice / 1.9 g  White bread, 1 slice / 0.7 g  Raisin bread, 1 slice / 1.2 g  Plain bagel, 3 oz / 2 g  Flour tortilla, 1 oz / 0.9 g  Corn tortilla, 1 small / 1.5 g  Hamburger or hotdog bun, 1 small / 0.9 g Fruits / Dietary Fiber (g)  Apple with skin, 1 medium / 4.4 g  Sweetened applesauce,  cup / 1.5 g  Banana,  medium / 1.5 g  Grapes, 10 grapes / 0.4 g  Orange, 1 small / 2.3  g  Raisin, 1.5 oz / 1.6 g  Melon, 1 cup / 1.4 g Vegetables / Dietary Fiber (g)  Green beans (canned),  cup / 1.3 g  Carrots (cooked),  cup / 2.3 g  Broccoli (cooked),  cup / 2.8 g  Peas (cooked),  cup / 4.4 g  Mashed potatoes,  cup / 1.6 g  Lettuce, 1 cup / 0.5 g  Corn (canned),  cup / 1.6 g  Tomato,  cup / 1.1 g Document Released: 06/17/2005 Document Revised: 12/17/2011 Document Reviewed: 09/19/2011 [facility] Patient Information 2013 [place], [place].

## 2012-08-10 NOTE — Progress Notes (Signed)
Subjective:     Patient ID: Cristian Moore, male   DOB: May 17, 1941, 72 y.o.   MRN: [id number]  HPI  LARZ MARK  Aug 15, 1940 [id number]  Patient Care Team: Ernestina Penna, MD as PCP - General (Family Medicine) Iva Boop, MD as Consulting Physician (Gastroenterology) [name], MD as Consulting Physician (Cardiology)  This patient is a 72 y.o.male who presents today for surgical evaluation.   Reason for visit: Followup status post lap assisted left colectomy for recurrent diverticulitis 07/23/2012  Diagnosis Colon, segmental resection, rectosigmoid - ACUTE DIVERTICULITIS AND DIVERTICULOSIS. - NO ATYPIA OR MALIGNANCY. - RESECTION MARGINS, NEGATIVE FOR ATYPIA OR MALIGNANCY. Abigail Miyamoto MD Pathologist, Electronic Signature (Case signed 07/27/2012  Patient comes in today with his wife.  Doing relatively well.  No fevers chills or sweats.  Energy level okay.  Appetite coming back.  Moving bowels okay.  Sometimes loose with little bit of urgency.  Down to three a day.  Perianal region occasionally raw.  No bleeding.  Walking over a half hour a day.   Patient Active Problem List  Diagnosis  . DYSLIPIDEMIA  . Obesity (BMI 30-39.9)  . CAROTID ARTERY DISEASE  . Diverticulitis, recurrent, s/p lap sigmoid colectomy 07/23/2012  . Shortness of breath  . Obstructive sleep apnea    Past Medical History  Diagnosis Date  . Hyperlipidemia   . Musculoskeletal pain     in the right shoulder - S/P ROTATOR CUFF REPAIR-BUT STILL HAS PAIN  . Laceration  01/11/2009     Deep laceration left index finger dorsally  . Diverticulosis   . Carotid stenosis   . Exogenous obesity   . Nasal congestion   . Thyroiditis   . Diverticulitis   . Benign prostatic hypertrophy   . Arthritis   . GERD (gastroesophageal reflux disease)   . Rectal bleeding     PT ATTRIBUTES TO HEMORRHOIDS  . Difficulty urinating   . Tachyarrhythmia   . Obstructive sleep apnea      continuous positive airway  pressure  AUTO SET 6 TO 20 - USUALLY SETTLES OUT AT 10  . Hernia     BELLY BUTTON  . Ulcer 2008  . H/O hiatal hernia   . SUPRAVENTRICULAR TACHYCARDIA 08/01/2009    Qualifier: Diagnosis of  By: Cristela Felt, CNA, Christy    . Rotator cuff (capsule) sprain 05/17/2011    Past Surgical History  Procedure Laterality Date  . Inguinal hernia repair    . Kidney stone surgery    . Tonsillectomy    . Colonoscopy      for polyps  . Rotator cuff repair    . Knee surgery    . Elbow bursa surgery    . Laparoscopic sigmoid colectomy  07/23/2012    Procedure: LAPAROSCOPIC SIGMOID COLECTOMY;  Surgeon: Ardeth Sportsman, MD;  Location: [facility];  Service: General;  Laterality: N/A;  Laparoscopic Sigmoid Colectomy  . Umbilical hernia repair  07/23/2012    Procedure: HERNIA REPAIR UMBILICAL ADULT;  Surgeon: Ardeth Sportsman, MD;  Location: [facility];  Service: General;  Laterality: N/A;  Primary Umbilical Hernia Repair  . Proctoscopy  07/23/2012    Procedure: PROCTOSCOPY;  Surgeon: Ardeth Sportsman, MD;  Location: [facility];  Service: General;  Laterality: N/A;  Rigid Proctoscopy    History   Social History  . Marital Status: Married    Spouse Name: N/A    Number of Children: 4  . Years of Education: N/A   Occupational History  .  Retired    Social History Main Topics  . Smoking status: Former Smoker    Types: Cigars    Quit date: 08/12/1999  . Smokeless tobacco: Not on file  . Alcohol Use: No  . Drug Use: No  . Sexually Active: Not on file   Other Topics Concern  . Not on file   Social History Narrative  . No narrative on file    Family History  Problem Relation Age of Onset  . Stroke Mother   . Heart disease Mother     Valvular heart disease    Current Outpatient Prescriptions  Medication Sig Dispense Refill  . aspirin 325 MG tablet Take 162.5 mg by mouth daily.       . cholecalciferol (VITAMIN D) 1000 UNITS tablet Take 2,000 Units by mouth daily.       . Coenzyme Q10 (CO Q-10) 100 MG CAPS Take  100 mg by mouth daily.      . fish oil-omega-3 fatty acids 1000 MG capsule Take 1 g by mouth 2 (two) times daily.       . metoprolol (LOPRESSOR) 50 MG tablet Take 0.5 tablets (25 mg total) by mouth 2 (two) times daily.  90 tablet  3  . Multiple Vitamins-Minerals (CENTRUM SILVER PO) Take 1 tablet by mouth daily.      Marland Kitchen NITROSTAT 0.4 MG SL tablet Place 0.4 mg under the tongue every 5 (five) minutes as needed. Chest pains      . omeprazole (PRILOSEC) 20 MG capsule Take 20 mg by mouth. EVERY AM BEFORE BREAKFAST      . OVER THE COUNTER MEDICATION Take 1 capsule by mouth daily. Digestive advantage      . oxymetazoline (AFRIN) 0.05 % nasal spray Place 2 sprays into the nose as needed. Nasal congestion      . simvastatin (ZOCOR) 20 MG tablet Take 20 mg by mouth at bedtime.       . Sucralfate (CARAFATE PO) Take 1 g by mouth 2 (two) times daily.       . Tamsulosin HCl (FLOMAX) 0.4 MG CAPS Take 0.4 mg by mouth at bedtime.        No current facility-administered medications for this visit.     Allergies  Allergen Reactions  . Penicillins Rash    BP 134/82  Pulse 64  Temp(Src) 97.6 F (36.4 C) (Temporal)  Resp 16  Ht 6\' 1"  (1.854 m)  Wt 253 lb 9.6 oz (115.032 kg)  BMI 33.47 kg/m2  No results found.   Review of Systems  Constitutional: Negative for fever, chills and diaphoresis.  HENT: Negative for sore throat, trouble swallowing and neck pain.   Eyes: Negative for photophobia and visual disturbance.  Respiratory: Negative for choking and shortness of breath.   Cardiovascular: Negative for chest pain and palpitations.  Gastrointestinal: Negative for nausea, vomiting, abdominal distention, anal bleeding and rectal pain.  Genitourinary: Negative for dysuria, urgency, difficulty urinating and testicular pain.  Musculoskeletal: Negative for myalgias, arthralgias and gait problem.  Skin: Negative for color change and rash.  Neurological: Negative for dizziness, speech difficulty, weakness and  numbness.  Hematological: Negative for adenopathy.  Psychiatric/Behavioral: Negative for hallucinations, confusion and agitation.       Objective:   Physical Exam  Constitutional: He is oriented to person, place, and time. He appears well-developed and well-nourished. No distress.  HENT:  Head: Normocephalic.  Mouth/Throat: Oropharynx is clear and moist. No oropharyngeal exudate.  Eyes: Conjunctivae and EOM are normal. Pupils  are equal, round, and reactive to light. No scleral icterus.  Neck: Normal range of motion. No tracheal deviation present.  Cardiovascular: Normal rate, normal heart sounds and intact distal pulses.   Pulmonary/Chest: Effort normal. No respiratory distress.  Abdominal: Soft. He exhibits no distension. There is no tenderness. Hernia confirmed negative in the right inguinal area and confirmed negative in the left inguinal area.  Incisions clean with normal healing ridges.  No hernias  Musculoskeletal: Normal range of motion. He exhibits no tenderness.  Neurological: He is alert and oriented to person, place, and time. No cranial nerve deficit. He exhibits normal muscle tone. Coordination normal.  Skin: Skin is warm and dry. No rash noted. He is not diaphoretic.  Psychiatric: He has a normal mood and affect. His behavior is normal.       Assessment:     2-1/2 weeks status post laparoscopically assisted sigmoid colectomy for recurrent diverticulitis.  Recovering relatively well.     Plan:     Increase activity as tolerated to regular activity.  Do not push through pain.  Get up to walking an hour a day.  W[place] notes [facility] [place] [facility] to [facility] regularly.  Diet as tolerated. Okay to transition to high fiber diet now at this point.  Bowel regimen to avoid problems.  Return to clinic 3-4 weeks for final check.   Instructions discussed.  Followup with primary care physician for other health issues as would normally be done.  Questions  answered.  The patient expressed understanding and appreciation

## 2012-08-12 ENCOUNTER — Encounter: Payer: Self-pay | Admitting: Cardiology

## 2012-09-08 ENCOUNTER — Ambulatory Visit (INDEPENDENT_AMBULATORY_CARE_PROVIDER_SITE_OTHER): Payer: Medicare Other | Admitting: Surgery

## 2012-09-08 ENCOUNTER — Encounter (INDEPENDENT_AMBULATORY_CARE_PROVIDER_SITE_OTHER): Payer: Self-pay | Admitting: Surgery

## 2012-09-08 VITALS — BP 128/78 | HR 70 | Resp 18 | Ht 73.0 in | Wt 257.0 lb

## 2012-09-08 DIAGNOSIS — K5792 Diverticulitis of intestine, part unspecified, without perforation or abscess without bleeding: Secondary | ICD-10-CM

## 2012-09-08 DIAGNOSIS — K5732 Diverticulitis of large intestine without perforation or abscess without bleeding: Secondary | ICD-10-CM

## 2012-09-08 NOTE — Progress Notes (Signed)
Subjective:     Patient ID: Cristian Moore, male   DOB: [birth year], 72 y.o.   MRN: [id number]  HPI   Cristian Moore  [birth year] [id number]  Patient Care Team: Ernestina Penna, MD as PCP - General (Family Medicine) Iva Boop, MD as Consulting Physician (Gastroenterology) [name], MD as Consulting Physician (Cardiology)  This patient is a 72 y.o.male who presents today for surgical evaluation.   Reason for visit: Followup status post lap assisted left colectomy for recurrent diverticulitis 07/23/2012  Diagnosis Colon, segmental resection, rectosigmoid - ACUTE DIVERTICULITIS AND DIVERTICULOSIS. - NO ATYPIA OR MALIGNANCY. - RESECTION MARGINS, NEGATIVE FOR ATYPIA OR MALIGNANCY. Abigail Miyamoto MD Pathologist, Electronic Signature (Case signed 07/27/2012  Patient comes in today by himself.  Doing relatively well.  No fevers chills or sweats.  Energy level okay.  Appetite coming back.  Moving bowels well 2-3 x day.   No bleeding.  Walking over a half hour a day.  Feels his knees and hips keep him from getting up to an hour a day.  Thinking about seeing an orthopedic surgeon.  Itching at bellybutton.  Some mild bilateral lower quadrant crampy pain.  Getting better.  Not severe.  In good spirits.  Patient Active Problem List  Diagnosis  . DYSLIPIDEMIA  . Obesity (BMI 30-39.9)  . CAROTID ARTERY DISEASE  . Diverticulitis, recurrent, s/p lap sigmoid colectomy 07/23/2012  . Shortness of breath  . Obstructive sleep apnea    Past Medical History  Diagnosis Date  . Hyperlipidemia   . Musculoskeletal pain     in the right shoulder - S/P ROTATOR CUFF REPAIR-BUT STILL HAS PAIN  . Laceration  01/11/2009     Deep laceration left index finger dorsally  . Diverticulosis   . Carotid stenosis   . Exogenous obesity   . Nasal congestion   . Thyroiditis   . Diverticulitis   . Benign prostatic hypertrophy   . Arthritis   . GERD (gastroesophageal reflux disease)   . Rectal  bleeding     PT ATTRIBUTES TO HEMORRHOIDS  . Difficulty urinating   . Tachyarrhythmia   . Obstructive sleep apnea      continuous positive airway pressure  AUTO SET 6 TO 20 - USUALLY SETTLES OUT AT 10  . Hernia     BELLY BUTTON  . Ulcer 2008  . H/O hiatal hernia   . SUPRAVENTRICULAR TACHYCARDIA 08/01/2009    Qualifier: Diagnosis of  By: Cristela Felt, CNA, Christy    . Rotator cuff (capsule) sprain 05/17/2011    Past Surgical History  Procedure Laterality Date  . Inguinal hernia repair    . Kidney stone surgery    . Tonsillectomy    . Colonoscopy      for polyps  . Rotator cuff repair    . Knee surgery    . Elbow bursa surgery    . Laparoscopic sigmoid colectomy  07/23/2012    Procedure: LAPAROSCOPIC SIGMOID COLECTOMY;  Surgeon: Ardeth Sportsman, MD;  Location: [facility];  Service: General;  Laterality: N/A;  Laparoscopic Sigmoid Colectomy  . Umbilical hernia repair  07/23/2012    Procedure: HERNIA REPAIR UMBILICAL ADULT;  Surgeon: Ardeth Sportsman, MD;  Location: [facility];  Service: General;  Laterality: N/A;  Primary Umbilical Hernia Repair  . Proctoscopy  07/23/2012    Procedure: PROCTOSCOPY;  Surgeon: Ardeth Sportsman, MD;  Location: [facility];  Service: General;  Laterality: N/A;  Rigid Proctoscopy    History   Social  History  . Marital Status: Married    Spouse Name: N/A    Number of Children: 4  . Years of Education: N/A   Occupational History  . Retired    Social History Main Topics  . Smoking status: Former Smoker    Types: Cigars    Quit date: 08/12/1999  . Smokeless tobacco: Not on file  . Alcohol Use: No  . Drug Use: No  . Sexually Active: Not on file   Other Topics Concern  . Not on file   Social History Narrative  . No narrative on file    Family History  Problem Relation Age of Onset  . Stroke Mother   . Heart disease Mother     Valvular heart disease    Current Outpatient Prescriptions  Medication Sig Dispense Refill  . aspirin 325 MG tablet Take 162.5 mg  by mouth daily.       . cholecalciferol (VITAMIN D) 1000 UNITS tablet Take 2,000 Units by mouth daily.       . Coenzyme Q10 (CO Q-10) 100 MG CAPS Take 100 mg by mouth daily.      . fish oil-omega-3 fatty acids 1000 MG capsule Take 1 g by mouth 2 (two) times daily.       . metoprolol (LOPRESSOR) 50 MG tablet Take 0.5 tablets (25 mg total) by mouth 2 (two) times daily.  90 tablet  3  . Multiple Vitamins-Minerals (CENTRUM SILVER PO) Take 1 tablet by mouth daily.      Marland Kitchen NITROSTAT 0.4 MG SL tablet Place 0.4 mg under the tongue every 5 (five) minutes as needed. Chest pains      . omeprazole (PRILOSEC) 20 MG capsule Take 20 mg by mouth. EVERY AM BEFORE BREAKFAST      . OVER THE COUNTER MEDICATION Take 1 capsule by mouth daily. Digestive advantage      . oxymetazoline (AFRIN) 0.05 % nasal spray Place 2 sprays into the nose as needed. Nasal congestion      . simvastatin (ZOCOR) 20 MG tablet Take 20 mg by mouth at bedtime.       . Sucralfate (CARAFATE PO) Take 1 g by mouth 2 (two) times daily.       . Tamsulosin HCl (FLOMAX) 0.4 MG CAPS Take 0.4 mg by mouth at bedtime.        No current facility-administered medications for this visit.     Allergies  Allergen Reactions  . Penicillins Rash    BP 128/78  Pulse 70  Resp 18  Ht 6\' 1"  (1.854 m)  Wt 257 lb (116.574 kg)  BMI 33.91 kg/m2  No results found.   Review of Systems  Constitutional: Negative for fever, chills and diaphoresis.  HENT: Negative for sore throat, trouble swallowing and neck pain.   Eyes: Negative for photophobia and visual disturbance.  Respiratory: Negative for choking and shortness of breath.   Cardiovascular: Negative for chest pain and palpitations.  Gastrointestinal: Negative for nausea, vomiting, abdominal distention, anal bleeding and rectal pain.  Genitourinary: Negative for dysuria, urgency, difficulty urinating and testicular pain.  Musculoskeletal: Negative for myalgias, arthralgias and gait problem.  Skin:  Negative for color change and rash.  Neurological: Negative for dizziness, speech difficulty, weakness and numbness.  Hematological: Negative for adenopathy.  Psychiatric/Behavioral: Negative for hallucinations, confusion and agitation.       Objective:   Physical Exam  Constitutional: He is oriented to person, place, and time. He appears well-developed and well-nourished. No distress.  HENT:  Head: Normocephalic.  Mouth/Throat: Oropharynx is clear and moist. No oropharyngeal exudate.  Eyes: Conjunctivae and EOM are normal. Pupils are equal, round, and reactive to light. No scleral icterus.  Neck: Normal range of motion. No tracheal deviation present.  Cardiovascular: Normal rate, normal heart sounds and intact distal pulses.   Pulmonary/Chest: Effort normal. No respiratory distress.  Abdominal: Soft. He exhibits no distension. There is no tenderness. Hernia confirmed negative in the right inguinal area and confirmed negative in the left inguinal area.  Incisions clean with normal healing ridges.  No hernias  Musculoskeletal: Normal range of motion. He exhibits no tenderness.  Neurological: He is alert and oriented to person, place, and time. No cranial nerve deficit. He exhibits normal muscle tone. Coordination normal.  Skin: Skin is warm and dry. No rash noted. He is not diaphoretic.  Psychiatric: He has a normal mood and affect. His behavior is normal.       Assessment:     6 weeks status post laparoscopically assisted sigmoid colectomy for recurrent diverticulitis.  Recovering relatively well.     Plan:     Increase activity as tolerated to regular activity.  Do not push through pain.  Get up to walking an hour a day.  Consider swimming as a less stressful way to exercise without irritating his joints.  Consider a trial of ibuprofen nonsteroidals for arthralgias.   He is thinking about seeing orthopedic surgeon for his chronic knee and hip pain.  Leaning more toward seeing Dr.  Merlyn Albert since he has friends that have been surgery on  Followup colonoscopy by Dr. Leone Payor with gastroenterology per his protocol.  Diet as tolerated. Okay to transition to high fiber diet now at this point.  Bowel regimen to avoid problems.  Return to clinic PRN.   Instructions discussed.  Followup with primary care physician for other health issues as would normally be done.  Questions answered.  The patient expressed understanding and appreciation

## 2012-09-08 NOTE — Patient Instructions (Addendum)
GETTING TO GOOD BOWEL HEALTH. Irregular bowel habits such as constipation and diarrhea can lead to many problems over time.  Having one soft bowel movement a day is the most important way to prevent further problems.  The anorectal canal is designed to handle stretching and feces to safely manage our ability to get rid of solid waste (feces, poop, stool) out of our body.  BUT, hard constipated stools can act like ripping concrete bricks and diarrhea can be a burning fire to this very sensitive area of our body, causing inflamed hemorrhoids, anal fissures, increasing risk is perirectal abscesses, abdominal pain/bloating, an making irritable bowel worse.     The goal: ONE SOFT BOWEL MOVEMENT A DAY!  To have soft, regular bowel movements:    Drink at least 8 tall glasses of water a day.     Take plenty of fiber.  Fiber is the undigested part of plant food that passes into the colon, acting s "natures broom" to encourage bowel motility and movement.  Fiber can absorb and hold large amounts of water. This results in a larger, bulkier stool, which is soft and easier to pass. Work gradually over several weeks up to 6 servings a day of fiber (25g a day even more if needed) in the form of: o Vegetables -- Root (potatoes, carrots, turnips), leafy green (lettuce, salad greens, celery, spinach), or cooked high residue (cabbage, broccoli, etc) o Fruit -- Fresh (unpeeled skin & pulp), Dried (prunes, apricots, cherries, etc ),  or stewed ( applesauce)  o Whole grain breads, pasta, etc (whole wheat)  o Bran cereals    Bulking Agents -- This type of water-retaining fiber generally is easily obtained each day by one of the following:  o Psyllium bran -- The psyllium plant is remarkable because its ground seeds can retain so much water. This product is available as Metamucil, Konsyl, Effersyllium, Per Diem Fiber, or the less expensive generic preparation in drug and health food stores. Although labeled a laxative, it really  is not a laxative.  o Methylcellulose -- This is another fiber derived from wood which also retains water. It is available as Citrucel. o Polyethylene Glycol - and "artificial" fiber commonly called Miralax or Glycolax.  It is helpful for people with gassy or bloated feelings with regular fiber o Flax Seed - a less gassy fiber than psyllium   No reading or other relaxing activity while on the toilet. If bowel movements take longer than 5 minutes, you are too constipated   AVOID CONSTIPATION.  High fiber and water intake usually takes care of this.  Sometimes a laxative is needed to stimulate more frequent bowel movements, but    Laxatives are not a good long-term solution as it can wear the colon out. o Osmotics (Milk of Magnesia, Fleets phosphosoda, Magnesium citrate, MiraLax, GoLytely) are safer than  o Stimulants (Senokot, Castor Oil, Dulcolax, Ex Lax)    o Do not take laxatives for more than 7days in a row.    IF SEVERELY CONSTIPATED, try a Bowel Retraining Program: o Do not use laxatives.  o Eat a diet high in roughage, such as bran cereals and leafy vegetables.  o Drink six (6) ounces of prune or apricot juice each morning.  o Eat two (2) large servings of stewed fruit each day.  o Take one (1) heaping tablespoon of a psyllium-based bulking agent twice a day. Use sugar-free sweetener when possible to avoid excessive calories.  o Eat a normal breakfast.  o   Set aside 15 minutes after breakfast to sit on the toilet, but do not strain to have a bowel movement.  o If you do not have a bowel movement by the third day, use an enema and repeat the above steps.    Controlling diarrhea o Switch to liquids and simpler foods for a few days to avoid stressing your intestines further. o Avoid dairy products (especially milk & ice cream) for a short time.  The intestines often can lose the ability to digest lactose when stressed. o Avoid foods that cause gassiness or bloating.  Typical foods include  beans and other legumes, cabbage, broccoli, and dairy foods.  Every person has some sensitivity to other foods, so listen to our body and avoid those foods that trigger problems for you. o Adding fiber (Citrucel, Metamucil, psyllium, Miralax) gradually can help thicken stools by absorbing excess fluid and retrain the intestines to act more normally.  Slowly increase the dose over a few weeks.  Too much fiber too soon can backfire and cause cramping & bloating. o Probiotics (such as active yogurt, Align, etc) may help repopulate the intestines and colon with normal bacteria and calm down a sensitive digestive tract.  Most studies show it to be of mild help, though, and such products can be costly. o Medicines:   Bismuth subsalicylate (ex. Kayopectate, Pepto Bismol) every 30 minutes for up to 6 doses can help control diarrhea.  Avoid if pregnant.   Loperamide (Immodium) can slow down diarrhea.  Start with two tablets (4mg total) first and then try one tablet every 6 hours.  Avoid if you are having fevers or severe pain.  If you are not better or start feeling worse, stop all medicines and call your doctor for advice o Call your doctor if you are getting worse or not better.  Sometimes further testing (cultures, endoscopy, X-ray studies, bloodwork, etc) may be needed to help diagnose and treat the cause of the diarrhea.   Managing Pain  Pain after surgery or related to activity is often due to strain/injury to muscle, tendon, nerves and/or incisions.  This pain is usually short-term and will improve in a few months.   Many people find it helpful to do the following things TOGETHER to help speed the process of healing and to get back to regular activity more quickly:  1. Avoid heavy physical activity a.  no lifting greater than 20 pounds b. Do not "push through" the pain.  Listen to your body and avoid positions and maneuvers than reproduce the pain c. Walking is okay as tolerated, but go slowly and  stop when getting sore.  d. Remember: If it hurts to do it, then don't do it! 2. Take Anti-inflammatory medication  a. Take with food/snack around the clock for 1-2 weeks i. This helps the muscle and nerve tissues become less irritable and calm down faster b. Choose ONE of the following over-the-counter medications: i. Naproxen 220mg tabs (ex. Aleve) 1-2 pills twice a day  ii. Ibuprofen 200mg tabs (ex. Advil, Motrin) 3-4 pills with every meal and just before bedtime iii. Acetaminophen 500mg tabs (Tylenol) 1-2 pills with every meal and just before bedtime 3. Use a Heating pad or Ice/Cold Pack a. 4-6 times a day b. May use warm bath/hottub  or showers 4. Try Gentle Massage and/or Stretching  a. at the area of pain many times a day b. stop if you feel pain - do not overdo it  Try these steps together to   help you body heal faster and avoid making things get worse.  Doing just one of these things may not be enough.    If you are not getting better after two weeks or are noticing you are getting worse, contact our office for further advice; we may need to re-evaluate you & see what other things we can do to help.   

## 2012-09-28 ENCOUNTER — Telehealth: Payer: Self-pay | Admitting: Family Medicine

## 2012-10-30 ENCOUNTER — Encounter (INDEPENDENT_AMBULATORY_CARE_PROVIDER_SITE_OTHER): Payer: Self-pay

## 2013-02-20 ENCOUNTER — Emergency Department (HOSPITAL_COMMUNITY): Payer: Medicare Other

## 2013-02-20 ENCOUNTER — Encounter (HOSPITAL_COMMUNITY): Payer: Self-pay | Admitting: Emergency Medicine

## 2013-02-20 ENCOUNTER — Inpatient Hospital Stay (HOSPITAL_COMMUNITY)
Admission: EM | Admit: 2013-02-20 | Discharge: 2013-02-22 | DRG: 287 | Disposition: A | Discharge status: Home / Self Care | Payer: Medicare Other | Attending: Cardiology | Admitting: Cardiology

## 2013-02-20 DIAGNOSIS — N400 Enlarged prostate without lower urinary tract symptoms: Secondary | ICD-10-CM | POA: Diagnosis present

## 2013-02-20 DIAGNOSIS — R0609 Other forms of dyspnea: Secondary | ICD-10-CM | POA: Diagnosis present

## 2013-02-20 DIAGNOSIS — Z7982 Long term (current) use of aspirin: Secondary | ICD-10-CM

## 2013-02-20 DIAGNOSIS — I10 Essential (primary) hypertension: Secondary | ICD-10-CM | POA: Diagnosis present

## 2013-02-20 DIAGNOSIS — Z8673 Personal history of transient ischemic attack (TIA), and cerebral infarction without residual deficits: Secondary | ICD-10-CM

## 2013-02-20 DIAGNOSIS — E785 Hyperlipidemia, unspecified: Secondary | ICD-10-CM | POA: Diagnosis present

## 2013-02-20 DIAGNOSIS — R0989 Other specified symptoms and signs involving the circulatory and respiratory systems: Secondary | ICD-10-CM | POA: Diagnosis present

## 2013-02-20 DIAGNOSIS — G4733 Obstructive sleep apnea (adult) (pediatric): Secondary | ICD-10-CM | POA: Diagnosis present

## 2013-02-20 DIAGNOSIS — Z79899 Other long term (current) drug therapy: Secondary | ICD-10-CM

## 2013-02-20 DIAGNOSIS — I2510 Atherosclerotic heart disease of native coronary artery without angina pectoris: Principal | ICD-10-CM | POA: Diagnosis present

## 2013-02-20 DIAGNOSIS — I200 Unstable angina: Secondary | ICD-10-CM | POA: Diagnosis present

## 2013-02-20 DIAGNOSIS — M129 Arthropathy, unspecified: Secondary | ICD-10-CM | POA: Diagnosis present

## 2013-02-20 DIAGNOSIS — I4949 Other premature depolarization: Secondary | ICD-10-CM | POA: Diagnosis present

## 2013-02-20 DIAGNOSIS — I6529 Occlusion and stenosis of unspecified carotid artery: Secondary | ICD-10-CM | POA: Diagnosis present

## 2013-02-20 DIAGNOSIS — K219 Gastro-esophageal reflux disease without esophagitis: Secondary | ICD-10-CM | POA: Diagnosis present

## 2013-02-20 DIAGNOSIS — E669 Obesity, unspecified: Secondary | ICD-10-CM | POA: Diagnosis present

## 2013-02-20 HISTORY — DX: Chest pain, unspecified: R07.9

## 2013-02-20 LAB — CBC
HCT: 43.2 % (ref 39.0–52.0)
Hemoglobin: 15.3 g/dL (ref 13.0–17.0)
MCHC: 35.4 g/dL (ref 30.0–36.0)
RDW: 13.2 % (ref 11.5–15.5)
WBC: 8.6 10*3/uL (ref 4.0–10.5)

## 2013-02-20 LAB — APTT: aPTT: 25 seconds (ref 24–37)

## 2013-02-20 LAB — BASIC METABOLIC PANEL
BUN: 16 mg/dL (ref 6–23)
Chloride: 102 mEq/L (ref 96–112)
GFR calc Af Amer: 81 mL/min — ABNORMAL LOW (ref >90)
GFR calc non Af Amer: 70 mL/min — ABNORMAL LOW (ref >90)
Potassium: 3.7 mEq/L (ref 3.5–5.1)
Sodium: 139 mEq/L (ref 135–145)

## 2013-02-20 LAB — PROTIME-INR
INR: 0.99 (ref 0.00–1.49)
Prothrombin Time: 12.9 seconds (ref 11.6–15.2)

## 2013-02-20 MED ORDER — SUCRALFATE 1 G PO TABS
1.0000 g | ORAL_TABLET | Freq: Two times a day (BID) | ORAL | Status: DC
  Administered 2013-02-20 – 2013-02-22 (×4): 1 g via ORAL
  Filled 2013-02-20 (×5): qty 1

## 2013-02-20 MED ORDER — NITROGLYCERIN 0.4 MG SL SUBL
0.4000 mg | SUBLINGUAL_TABLET | Freq: Once | SUBLINGUAL | Status: AC
  Administered 2013-02-20: 0.4 mg via SUBLINGUAL
  Filled 2013-02-20: qty 25

## 2013-02-20 MED ORDER — HEPARIN (PORCINE) IN NACL 100-0.45 UNIT/ML-% IJ SOLN
1400.0000 [IU]/h | INTRAMUSCULAR | Status: DC
  Administered 2013-02-20 – 2013-02-21 (×2): 1400 [IU]/h via INTRAVENOUS
  Filled 2013-02-20 (×2): qty 250

## 2013-02-20 MED ORDER — TAMSULOSIN HCL 0.4 MG PO CAPS
0.4000 mg | ORAL_CAPSULE | Freq: Every day | ORAL | Status: DC
Start: 2013-02-20 — End: 2013-02-22
  Administered 2013-02-20 – 2013-02-21 (×2): 0.4 mg via ORAL
  Filled 2013-02-20 (×3): qty 1

## 2013-02-20 MED ORDER — SODIUM CHLORIDE 0.9 % IJ SOLN: 3.0000 mL | INTRAMUSCULAR | Status: DC | PRN

## 2013-02-20 MED ORDER — NITROGLYCERIN IN D5W 200-5 MCG/ML-% IV SOLN: 3.0000 ug/min | INTRAVENOUS | Status: DC

## 2013-02-20 MED ORDER — SODIUM CHLORIDE 0.9 % IV SOLN: 250.0000 mL | INTRAVENOUS | Status: DC | PRN

## 2013-02-20 MED ORDER — ASPIRIN 325 MG PO TABS: 162.5000 mg | ORAL_TABLET | Freq: Every day | ORAL | Status: DC

## 2013-02-20 MED ORDER — SIMVASTATIN 20 MG PO TABS
20.0000 mg | ORAL_TABLET | Freq: Every day | ORAL | Status: DC
  Administered 2013-02-20 – 2013-02-21 (×2): 20 mg via ORAL
  Filled 2013-02-20 (×3): qty 1

## 2013-02-20 MED ORDER — METOPROLOL TARTRATE 25 MG PO TABS
25.0000 mg | ORAL_TABLET | Freq: Two times a day (BID) | ORAL | Status: DC
  Administered 2013-02-20 – 2013-02-22 (×3): 25 mg via ORAL
  Filled 2013-02-20 (×5): qty 1

## 2013-02-20 MED ORDER — PANTOPRAZOLE SODIUM 40 MG PO TBEC
40.0000 mg | DELAYED_RELEASE_TABLET | Freq: Every day | ORAL | Status: DC
  Administered 2013-02-21 – 2013-02-22 (×2): 40 mg via ORAL
  Filled 2013-02-20 (×2): qty 1

## 2013-02-20 MED ORDER — ACETAMINOPHEN 325 MG PO TABS
650.0000 mg | ORAL_TABLET | ORAL | Status: DC | PRN
  Administered 2013-02-20 – 2013-02-22 (×2): 650 mg via ORAL
  Filled 2013-02-20 (×2): qty 2

## 2013-02-20 MED ORDER — NITROGLYCERIN IN D5W 200-5 MCG/ML-% IV SOLN
2.0000 ug/min | Freq: Once | INTRAVENOUS | Status: AC
  Administered 2013-02-20: 5 ug/min via INTRAVENOUS
  Filled 2013-02-20: qty 250

## 2013-02-20 MED ORDER — ONDANSETRON HCL 4 MG/2ML IJ SOLN: 4.0000 mg | Freq: Four times a day (QID) | INTRAMUSCULAR | Status: DC | PRN

## 2013-02-20 MED ORDER — SODIUM CHLORIDE 0.9 % IV SOLN: 1.0000 mL/kg/h | INTRAVENOUS | Status: DC

## 2013-02-20 MED ORDER — ASPIRIN 81 MG PO CHEW
324.0000 mg | CHEWABLE_TABLET | ORAL | Status: AC
  Administered 2013-02-22: 324 mg via ORAL
  Filled 2013-02-20: qty 4

## 2013-02-20 MED ORDER — ASPIRIN EC 81 MG PO TBEC: 81.0000 mg | DELAYED_RELEASE_TABLET | Freq: Every day | ORAL | Status: DC

## 2013-02-20 MED ORDER — ASPIRIN 81 MG PO CHEW
162.0000 mg | CHEWABLE_TABLET | Freq: Every day | ORAL | Status: DC
  Administered 2013-02-20 – 2013-02-21 (×2): 162 mg via ORAL
  Filled 2013-02-20: qty 2
  Filled 2013-02-20: qty 1

## 2013-02-20 MED ORDER — NITROGLYCERIN 0.4 MG SL SUBL: 0.4000 mg | SUBLINGUAL_TABLET | SUBLINGUAL | Status: DC | PRN

## 2013-02-20 MED ORDER — SODIUM CHLORIDE 0.9 % IJ SOLN
3.0000 mL | Freq: Two times a day (BID) | INTRAMUSCULAR | Status: DC
  Administered 2013-02-21 – 2013-02-22 (×3): 3 mL via INTRAVENOUS

## 2013-02-20 MED ORDER — ZOLPIDEM TARTRATE 5 MG PO TABS: 5.0000 mg | ORAL_TABLET | Freq: Every evening | ORAL | Status: DC | PRN

## 2013-02-20 MED ORDER — HEPARIN BOLUS VIA INFUSION
4000.0000 [IU] | Freq: Once | INTRAVENOUS | Status: AC
  Administered 2013-02-20: 4000 [IU] via INTRAVENOUS

## 2013-02-20 NOTE — ED Notes (Signed)
Dr Pickering at bedside 

## 2013-02-20 NOTE — ED Notes (Signed)
Dr pickering at bedside  

## 2013-02-20 NOTE — ED Provider Notes (Signed)
CSN: [id number]     Arrival date & time 02/20/13  1519 History     First MD Initiated Contact with Patient 02/20/13 1606     Chief Complaint  Patient presents with  . Chest Pain  . Shortness of Breath   (Consider location/radiation/quality/duration/timing/severity/associated sxs/prior Treatment) The history is provided by the patient.   patient presents with left-sided chest pressure and pain has been coming on with exertion over last couple weeks. Today it came on at rest. He states he's also had increasing shortness of breath and has not been able to do the same thing she was previously able to do. He states that while he was setting up a camper yesterday he became diaphoretic and felt weak. No fevers. No cough. He has a history of supraventricular tachycardia. No recent change in medications. No abdominal pain. No fevers. No dark stools. He sees Adult nurse cardiology.  Past Medical History  Diagnosis Date  . Hyperlipidemia   . Musculoskeletal pain     in the right shoulder - S/P ROTATOR CUFF REPAIR-BUT STILL HAS PAIN  . Laceration  01/11/2009     Deep laceration left index finger dorsally  . Diverticulosis   . Carotid stenosis   . Exogenous obesity   . Thyroiditis   . Diverticulitis   . Benign prostatic hypertrophy   . Arthritis   . GERD (gastroesophageal reflux disease)   . Rectal bleeding     PT ATTRIBUTES TO HEMORRHOIDS  . Difficulty urinating   . Obstructive sleep apnea      CPAP    AUTO SET 6 TO 20 - USUALLY SETTLES OUT AT 10  . Hernia     Umbulical   . Ulcer 2008  . H/O hiatal hernia   . SUPRAVENTRICULAR TACHYCARDIA 08/01/2009    Qualifier: Diagnosis of  By: Marciano Sequin     Past Surgical History  Procedure Laterality Date  . Inguinal hernia repair    . Kidney stone surgery    . Tonsillectomy    . Colonoscopy      for polyps  . Rotator cuff repair    . Knee surgery    . Elbow bursa surgery    . Laparoscopic sigmoid colectomy  07/23/2012    Procedure:  LAPAROSCOPIC SIGMOID COLECTOMY;  Surgeon: Ardeth Sportsman, MD;  Location: [facility];  Service: General;  Laterality: N/A;  Laparoscopic Sigmoid Colectomy  . Umbilical hernia repair  07/23/2012    Procedure: HERNIA REPAIR UMBILICAL ADULT;  Surgeon: Ardeth Sportsman, MD;  Location: [facility];  Service: General;  Laterality: N/A;  Primary Umbilical Hernia Repair  . Proctoscopy  07/23/2012    Procedure: PROCTOSCOPY;  Surgeon: Ardeth Sportsman, MD;  Location: [facility];  Service: General;  Laterality: N/A;  Rigid Proctoscopy   Family History  Problem Relation Age of Onset  . Stroke Mother   . Heart disease Mother     Valvular heart disease, Rheumatic fever   History  Substance Use Topics  . Smoking status: Former Smoker    Types: Cigars    Quit date: 08/12/1999  . Smokeless tobacco: Not on file  . Alcohol Use: No    Review of Systems  Constitutional: Negative for activity change and appetite change.  HENT: Negative for neck stiffness.   Eyes: Negative for pain.  Respiratory: Positive for shortness of breath. Negative for chest tightness.   Cardiovascular: Positive for chest pain. Negative for leg swelling.  Gastrointestinal: Negative for nausea, vomiting, abdominal pain and diarrhea.  Genitourinary: Negative for flank pain.  Musculoskeletal: Negative for back pain.  Skin: Negative for rash.  Neurological: Negative for weakness, numbness and headaches.  Psychiatric/Behavioral: Negative for behavioral problems.    Allergies  Penicillins  Home Medications   Current Outpatient Rx  Name  Route  Sig  Dispense  Refill  . aspirin 325 MG tablet   Oral   Take 162.5 mg by mouth daily.          . cholecalciferol (VITAMIN D) 1000 UNITS tablet   Oral   Take 2,000 Units by mouth daily.          . Coenzyme Q10 (CO Q-10) 100 MG CAPS   Oral   Take 100 mg by mouth daily.         . fish oil-omega-3 fatty acids 1000 MG capsule   Oral   Take 1 g by mouth 2 (two) times daily.          .  metoprolol (LOPRESSOR) 50 MG tablet   Oral   Take 0.5 tablets (25 mg total) by mouth 2 (two) times daily.   90 tablet   3   . Multiple Vitamins-Minerals (CENTRUM SILVER PO)   Oral   Take 1 tablet by mouth daily.         Marland Kitchen NITROSTAT 0.4 MG SL tablet   Sublingual   Place 0.4 mg under the tongue every 5 (five) minutes as needed. Chest pains         . omeprazole (PRILOSEC) 20 MG capsule   Oral   Take 20 mg by mouth daily. EVERY AM BEFORE BREAKFAST         . OVER THE COUNTER MEDICATION   Oral   Take 1 capsule by mouth daily. Digestive advantage         . simvastatin (ZOCOR) 20 MG tablet   Oral   Take 20 mg by mouth at bedtime.          . sodium chloride (OCEAN) 0.65 % nasal spray   Nasal   Place 1 spray into the nose as needed for congestion.         . sucralfate (CARAFATE) 1 G tablet   Oral   Take 1 g by mouth 2 (two) times daily.         . Tamsulosin HCl (FLOMAX) 0.4 MG CAPS   Oral   Take 0.4 mg by mouth at bedtime.          Marland Kitchen zolpidem (AMBIEN) 10 MG tablet   Oral   Take 10 mg by mouth at bedtime as needed for sleep.          BP 99/60  Pulse 59  Temp(Src) 98.2 F (36.8 C) (Oral)  Resp 12  Ht 6' 0.84" (1.85 m)  Wt 252 lb (114.306 kg)  BMI 33.4 kg/m2  SpO2 98% Physical Exam  Nursing note and vitals reviewed. Constitutional: He is oriented to person, place, and time. He appears well-developed and well-nourished.  HENT:  Head: Normocephalic and atraumatic.  Eyes: EOM are normal. Pupils are equal, round, and reactive to light.  Neck: Normal range of motion. Neck supple.  Cardiovascular: Normal rate, regular rhythm and normal heart sounds.   No murmur heard. Pulmonary/Chest: Effort normal and breath sounds normal.  Abdominal: Soft. Bowel sounds are normal. He exhibits no distension and no mass. There is no tenderness. There is no rebound and no guarding.  Musculoskeletal: Normal range of motion. He exhibits no edema.  Neurological: He  is alert  and oriented to person, place, and time. No cranial nerve deficit.  Skin: Skin is warm and dry.  Psychiatric: He has a normal mood and affect.    ED Course   Procedures (including critical care time)  Labs Reviewed  BASIC METABOLIC PANEL - Abnormal; Notable for the following:    Glucose, Bld 101 (*)    GFR calc non Af Amer 70 (*)    GFR calc Af Amer 81 (*)    All other components within normal limits  CBC  PRO B NATRIURETIC PEPTIDE  PROTIME-INR  APTT  HEPARIN LEVEL (UNFRACTIONATED)  POCT I-STAT TROPONIN I   Dg Chest Port 1 View  02/20/2013   *RADIOLOGY REPORT*  Clinical Data: Shortness of breath, chest pain  PORTABLE CHEST - 1 VIEW  Comparison: Portable exam 1630 hours compared to 05/21/2011  Findings: Normal heart size, mediastinal contours, and pulmonary vascularity. Minimal left basilar atelectasis. Lungs otherwise clear. No pleural effusion or pneumothorax. Bones unremarkable.  IMPRESSION: Minimal left basilar atelectasis.   Original Report Authenticated By: Ulyses Southward, M.D.   1. Unstable angina   CRITICAL CARE Performed by: Billee Cashing Total critical care time: 30 Critical care time was exclusive of separately billable procedures and treating other patients. Critical care was necessary to treat or prevent imminent or life-threatening deterioration. Critical care was time spent personally by me on the following activities: development of treatment plan with patient and/or surrogate as well as nursing, discussions with consultants, evaluation of patient's response to treatment, examination of patient, obtaining history from patient or surrogate, ordering and performing treatments and interventions, ordering and review of laboratory studies, ordering and review of radiographic studies, pulse oximetry and re-evaluation of patient's condition.   MDM  Patient with exertional chest pain and increasing dyspnea. He has been unable to do the same on physical activity and the  pain came on at rest today. Pain is much improved with nitroglycerin. This appears to be an unstable angina and will likely need heart catheterization. He was started on IV heparin and nitroglycerin in the ED. Initial cardiac enzymes are negative. He'll be admitted to cardiology.   Date: 02/20/2013  Rate: 79  Rhythm: normal sinus rhythm  QRS Axis: normal  Intervals: normal  ST/T Wave abnormalities: normal  Conduction Disutrbances:none  Narrative Interpretation:   Old EKG Reviewed: unchanged Low limb voltage   Harrold Donath R. Rubin Payor, MD 02/20/13 2001

## 2013-02-20 NOTE — Progress Notes (Signed)
ANTICOAGULATION CONSULT NOTE - Initial Consult  Pharmacy Consult for Heparin Indication: chest pain/ACS  Allergies  Allergen Reactions  . Penicillins Rash    Over 60 years ago    Patient Measurements: Height: 6' 0.83" (185 cm) Weight: 252 lb (114.306 kg) IBW/kg (Calculated) : 79.52 Heparin Dosing Weight: 104 kg  Vital Signs: Temp: 98.2 F (36.8 C) (08/23 1533) Temp src: Oral (08/23 1533) BP: 99/60 mmHg (08/23 1730) Pulse Rate: 59 (08/23 1730)  Labs:  Recent Labs  02/20/13 1605 02/20/13 1617  HGB 15.3  --   HCT 43.2  --   PLT 228  --   APTT  --  25  LABPROT  --  12.9  INR  --  0.99  CREATININE 1.03  --     Estimated Creatinine Clearance: 84.4 ml/min (by C-G formula based on Cr of 1.03).   Medical History: Past Medical History  Diagnosis Date  . Hyperlipidemia   . Musculoskeletal pain     in the right shoulder - S/P ROTATOR CUFF REPAIR-BUT STILL HAS PAIN  . Laceration  01/11/2009     Deep laceration left index finger dorsally  . Diverticulosis   . Carotid stenosis   . Exogenous obesity   . Nasal congestion   . Thyroiditis   . Diverticulitis   . Benign prostatic hypertrophy   . Arthritis   . GERD (gastroesophageal reflux disease)   . Rectal bleeding     PT ATTRIBUTES TO HEMORRHOIDS  . Difficulty urinating   . Tachyarrhythmia   . Obstructive sleep apnea      continuous positive airway pressure  AUTO SET 6 TO 20 - USUALLY SETTLES OUT AT 10  . Hernia     BELLY BUTTON  . Ulcer 2008  . H/O hiatal hernia   . SUPRAVENTRICULAR TACHYCARDIA 08/01/2009    Qualifier: Diagnosis of  By: Cristela Felt, CNA, Christy    . Rotator cuff (capsule) sprain 05/17/2011    Assessment: 72 y.o. M who [facility]23 with intermittent CP for 2 weeks with increasing SOB and dizziness. Pharmacy was consulted to start heparin for anticoagulation for ACS while awaiting further cardiology work-up.  The patient was not on any anticoagulants PTA except for ASA. The patient's  last surgery was in Jan '14 (~7 months ago) and was a lap-colectomy, umbilical hernia repair, and proctoscopy. No hx CVA or bleeding noted per the chart.  Goal of Therapy:  Heparin level 0.3-0.7 units/ml Monitor platelets by anticoagulation protocol: Yes   Plan:  1. Heparin bolus of 4000 units x 1 2. Initiate heparin drip at a rate of 1400 units/hr (14 ml/hr) 3. Will continue to monitor for any signs/symptoms of bleeding and will follow up with heparin level in 8 hours  Georgina Pillion, PharmD, BCPS Clinical Pharmacist Pager: [phone] 02/20/2013 5:51 PM

## 2013-02-20 NOTE — ED Notes (Signed)
Chest pain on and off for past two weeks. Increasingly shortness of breath for past week, c/o dizziness as well.  Chest pain is intermittent and radiates to left shoulder and back, and left arm.  Pain comes with rest and gets worse with exertion.  Denies nausea or vomiting.  Pt took half of a 325mg  ASA this morning.

## 2013-02-20 NOTE — ED Notes (Signed)
Pt denies chest pain at this time. Nitro drip remains at 36mcg/min. NAD noted at this time. Family at bedside.

## 2013-02-20 NOTE — Progress Notes (Signed)
RT Note: Pt wife states she brought PT's home CPAP to use while he is here. She states she can assist pt with use as they do at home. I told her to call if they need further assistance from RT. Rt will continue to monitor

## 2013-02-20 NOTE — H&P (Signed)
CARDIOLOGY ADMISSION NOTE  Patient ID: Cristian Moore MRN: [id number] DOB/AGE: 1941-02-14 72 y.o.  Admit date: 02/20/2013 Primary Physician   Rudi Heap, MD Primary Cardiologist   Dr. Antoine Poche Chief Complaint    Chest pain, dyspnea  HPI:  The patient has a history of SVT.  He has no history of obstructive coronary disease. He had distant cardiac cath. The last stress test 2 years ago demonstrated a fixed small apical defect that would be consistent with standing. He says he's been getting some shoulder discomfort for about a month. However, he thought this was related to previous shoulder injury. He has been getting increasing dyspnea area this has been worsening and coming on with lighter activity. He'll get short of breath walking up stairs or a moderate distance on level ground. He has been getting some chest discomfort. He describes somewhat of a heaviness that has been mild to moderate in intensity. He has had some associated diaphoresis with this. He has had this discomfort reproducible with activity and some at rest. Because the discomfort was worse today he came to the emergency room where he was having some discomfort but had no acute EKG changes or initial enzyme changes. He was given sublingual nitroglycerin with resolution of his symptoms. He has not had these symptoms before. He denies any tachycardia. He was having PVCs in the ER.   Past Medical History  Diagnosis Date  . Hyperlipidemia   . Musculoskeletal pain     in the right shoulder - S/P ROTATOR CUFF REPAIR-BUT STILL HAS PAIN  . Laceration  01/11/2009     Deep laceration left index finger dorsally  . Diverticulosis   . Carotid stenosis   . Exogenous obesity   . Thyroiditis   . Diverticulitis   . Benign prostatic hypertrophy   . Arthritis   . GERD (gastroesophageal reflux disease)   . Rectal bleeding     PT ATTRIBUTES TO HEMORRHOIDS  . Difficulty urinating   . Obstructive sleep apnea      CPAP    AUTO SET 6 TO  20 - USUALLY SETTLES OUT AT 10  . Hernia     Umbulical   . Ulcer 2008  . H/O hiatal hernia   . SUPRAVENTRICULAR TACHYCARDIA 08/01/2009    Qualifier: Diagnosis of  By: Marciano Sequin      Past Surgical History  Procedure Laterality Date  . Inguinal hernia repair    . Kidney stone surgery    . Tonsillectomy    . Colonoscopy      for polyps  . Rotator cuff repair    . Knee surgery    . Elbow bursa surgery    . Laparoscopic sigmoid colectomy  07/23/2012    Procedure: LAPAROSCOPIC SIGMOID COLECTOMY;  Surgeon: Ardeth Sportsman, MD;  Location: [facility];  Service: General;  Laterality: N/A;  Laparoscopic Sigmoid Colectomy  . Umbilical hernia repair  07/23/2012    Procedure: HERNIA REPAIR UMBILICAL ADULT;  Surgeon: Ardeth Sportsman, MD;  Location: [facility];  Service: General;  Laterality: N/A;  Primary Umbilical Hernia Repair  . Proctoscopy  07/23/2012    Procedure: PROCTOSCOPY;  Surgeon: Ardeth Sportsman, MD;  Location: [facility];  Service: General;  Laterality: N/A;  Rigid Proctoscopy    Allergies  Allergen Reactions  . Penicillins Rash    Over 60 years ago   No current facility-administered medications on file prior to encounter.   Current Outpatient Prescriptions on File Prior to Encounter  Medication  Sig Dispense Refill  . aspirin 325 MG tablet Take 162.5 mg by mouth daily.       . cholecalciferol (VITAMIN D) 1000 UNITS tablet Take 2,000 Units by mouth daily.       . Coenzyme Q10 (CO Q-10) 100 MG CAPS Take 100 mg by mouth daily.      . fish oil-omega-3 fatty acids 1000 MG capsule Take 1 g by mouth 2 (two) times daily.       . metoprolol (LOPRESSOR) 50 MG tablet Take 0.5 tablets (25 mg total) by mouth 2 (two) times daily.  90 tablet  3  . Multiple Vitamins-Minerals (CENTRUM SILVER PO) Take 1 tablet by mouth daily.      Marland Kitchen NITROSTAT 0.4 MG SL tablet Place 0.4 mg under the tongue every 5 (five) minutes as needed. Chest pains      . omeprazole (PRILOSEC) 20 MG capsule Take 20 mg by mouth daily.  EVERY AM BEFORE BREAKFAST      . OVER THE COUNTER MEDICATION Take 1 capsule by mouth daily. Digestive advantage      . simvastatin (ZOCOR) 20 MG tablet Take 20 mg by mouth at bedtime.       . Tamsulosin HCl (FLOMAX) 0.4 MG CAPS Take 0.4 mg by mouth at bedtime.        History   Social History  . Marital Status: Married    Spouse Name: N/A    Number of Children: 4  . Years of Education: N/A   Occupational History  . Retired    Social History Main Topics  . Smoking status: Former Smoker    Types: Cigars    Quit date: 08/12/1999  . Smokeless tobacco: Not on file  . Alcohol Use: No  . Drug Use: No  . Sexual Activity: Not on file   Other Topics Concern  . Not on file   Social History Narrative   Lives at home with wife    Family History  Problem Relation Age of Onset  . Stroke Mother   . Heart disease Mother     Valvular heart disease, Rheumatic fever    ROS:  As stated in the HPI and negative for all other systems.  Physical Exam: Blood pressure 99/60, pulse 59, temperature 98.2 F (36.8 C), temperature source Oral, resp. rate 12, height 6' 0.84" (1.85 m), weight 252 lb (114.306 kg), SpO2 98.00%.  GENERAL:  Well appearing HEENT:  Pupils equal round and reactive, fundi not visualized, oral mucosa unremarkable NECK:  No jugular venous distention, waveform within normal limits, carotid upstroke brisk and symmetric, no bruits, no thyromegaly LYMPHATICS:  No cervical, inguinal adenopathy LUNGS:  Clear to auscultation bilaterally BACK:  No CVA tenderness CHEST:  Unremarkable HEART:  PMI not displaced or sustained,S1 and S2 within normal limits, no S3, no S4, no clicks, no rubs, no murmurs ABD:  Flat, positive bowel sounds normal in frequency in pitch, no bruits, no rebound, no guarding, no midline pulsatile mass, no hepatomegaly, no splenomegaly EXT:  2 plus pulses throughout, no edema, no cyanosis no clubbing SKIN:  No rashes no nodules NEURO:  Cranial nerves II through XII  grossly intact, motor grossly intact throughout PSYCH:  Cognitively intact, oriented to person place and time  Labs: Lab Results  Component Value Date   BUN 16 02/20/2013   Lab Results  Component Value Date   CREATININE 1.03 02/20/2013   Lab Results  Component Value Date   NA 139 02/20/2013   K 3.7  02/20/2013   CL 102 02/20/2013   CO2 24 02/20/2013    Lab Results  Component Value Date   WBC 8.6 02/20/2013   HGB 15.3 02/20/2013   HCT 43.2 02/20/2013   MCV 92.7 02/20/2013   PLT 228 02/20/2013    Radiology:   CXR:  Minimal left basilar atelectasis.   EKG:  Sinus rhythm, rate 79, axis within normal limits, intervals within normal limits, no acute ST-T wave changes.   ASSESSMENT AND PLAN:    UNSTABLE ANGINA:  The risk of obstructive coronary disease is high. Cardiac catheterization is indicated. The patient understands that risks included but are not limited to stroke (1 in 1000), death (1 in 1000), kidney failure [usually temporary] (1 in 500), bleeding (1 in 200), allergic reaction [possibly serious] (1 in 200).  The patient understands and agrees to proceed.  The patient will be treated with IV nitroglycerin and heparin. We'll continue the beta blocker.  SLEEP APNEA:  He can use his home CPAP  HYPERLIPIDEMIA:    I do not see a recent lipid profile. Withdrawal long and treat accordingly. He is not currently on target dose statin. This will likely be adjusted before discharge based on the findings of his angiogram and lipid profile.  HTN: For now he will be continued on his outpatient medications as well as nitroglycerin IV.  SignedRollene Rotunda 02/20/2013, 6:13 PM

## 2013-02-21 LAB — TSH: TSH: 0.677 u[IU]/mL (ref 0.350–4.500)

## 2013-02-21 LAB — HEPARIN LEVEL (UNFRACTIONATED): Heparin Unfractionated: 0.66 IU/mL (ref 0.30–0.70)

## 2013-02-21 LAB — CBC
HCT: 40.6 % (ref 39.0–52.0)
Hemoglobin: 13.9 g/dL (ref 13.0–17.0)
Hemoglobin: 14.2 g/dL (ref 13.0–17.0)
MCH: 32.8 pg (ref 26.0–34.0)
MCHC: 35.3 g/dL (ref 30.0–36.0)
MCHC: 35 g/dL (ref 30.0–36.0)
MCV: 93.8 fL (ref 78.0–100.0)
Platelets: 211 10*3/uL (ref 150–400)
RBC: 4.33 MIL/uL (ref 4.22–5.81)
RDW: 13.4 % (ref 11.5–15.5)

## 2013-02-21 LAB — LIPID PANEL
Cholesterol: 121 mg/dL (ref 0–200)
HDL: 39 mg/dL — ABNORMAL LOW (ref >39)
Triglycerides: 106 mg/dL (ref <150)

## 2013-02-21 LAB — BASIC METABOLIC PANEL
GFR calc Af Amer: >90 mL/min (ref >90)
GFR calc non Af Amer: 81 mL/min — ABNORMAL LOW (ref >90)
Glucose, Bld: 136 mg/dL — ABNORMAL HIGH (ref 70–99)
Potassium: 3.4 mEq/L — ABNORMAL LOW (ref 3.5–5.1)
Sodium: 138 mEq/L (ref 135–145)

## 2013-02-21 LAB — TROPONIN I
Troponin I: <0.3 ng/mL (ref <0.30)
Troponin I: <0.3 ng/mL (ref <0.30)

## 2013-02-21 MED ORDER — HEPARIN (PORCINE) IN NACL 100-0.45 UNIT/ML-% IJ SOLN
1300.0000 [IU]/h | INTRAMUSCULAR | Status: DC
  Administered 2013-02-22: 1300 [IU]/h via INTRAVENOUS
  Filled 2013-02-21 (×2): qty 250

## 2013-02-21 NOTE — Progress Notes (Signed)
ANTICOAGULATION CONSULT NOTE - Follow Up Consult  Pharmacy Consult for Heparin Indication: chest pain/ACS  Patient Measurements: Height: 6' 0.5" (184.2 cm) Weight: 252 lb (114.306 kg) IBW/kg (Calculated) : 78.75 Heparin Dosing Weight: 104 kg Vital Signs: Temp: 97.4 F (36.3 C) (08/24 0538) Temp src: Oral (08/24 0538) BP: 100/53 mmHg (08/24 0538) Pulse Rate: 54 (08/24 0538) Labs:  Recent Labs  02/20/13 1605 02/20/13 1617 02/20/13 2103 02/21/13 0212 02/21/13 0838 02/21/13 0845  HGB 15.3  --   --  13.9 14.2  --   HCT 43.2  --   --  39.4 40.6  --   PLT 228  --   --  211 193  --   APTT  --  25  --   --   --   --   LABPROT  --  12.9  --   --   --   --   INR  --  0.99  --   --   --   --   HEPARINUNFRC  --   --   --  0.66 0.77*  --   CREATININE 1.03  --   --  0.94  --   --   TROPONINI  --   --  <0.30 <0.30  --  <0.30   Estimated Creatinine Clearance: 92.1 ml/min (by C-G formula based on Cr of 0.94).  Assessment: 72 YOM on IV heparin for ACS with plan for cardiac cath tomorrow.  Heparin level is supra-therapeutic this am.   Goal of Therapy:  Heparin level 0.3-0.7 units/ml Monitor platelets by anticoagulation protocol: Yes   Plan:  1. Decrease heparin to 1300 units/hr.  2. Heparin level in 6 hours.   Link Snuffer, PharmD, BCPS Clinical Pharmacist [phone] 02/21/2013,10:47 AM

## 2013-02-21 NOTE — Progress Notes (Signed)
02/21/13  Pharmacy-  Heparin 0320  Heparin level 0.66  A/P:  72yo male with chest pain/ACS.  Heparin level therapeutic on current rate, no bleeding problems noted.  1.  Continue Heparin 1400 units/hr 2.  Repeat heparin level in 6hr to verify therapeutic  Marisue Humble, PharmD Clinical Pharmacist [facility]

## 2013-02-21 NOTE — Progress Notes (Signed)
Pt currently not c/o chest pain, does have a slight headache. BP 102/50, hr 58- 54, 92% on Cpap. Nitro drip stopped. Paged MD Hochrein and made him aware. Will continue to monitor.

## 2013-02-21 NOTE — Progress Notes (Signed)
 CARDIOLOGY ADMISSION NOTE  Patient ID: Cristian Moore MRN: [id number] DOB/AGE: 07/19/1940 72 y.o.  Admit date: 02/20/2013 Primary Physician   MOORE, DONALD, MD Primary Cardiologist   Dr. Hochrein Chief Complaint    Chest pain, dyspnea  HPI:  The patient has a history of SVT.  He has no history of obstructive coronary disease. He had distant cardiac cath. The last stress test 2 years ago demonstrated a fixed small apical defect that would be consistent with apical thinning. He says he's been getting some shoulder discomfort for about a month.  He has been getting increasing dyspnea area this has been worsening and coming on with lighter activity. He'll get short of breath walking up stairs or a moderate distance on level ground. He has been getting some chest discomfort. He describes somewhat of a heaviness that has been mild to moderate in intensity. He has had some associated diaphoresis with this. He has had this discomfort reproducible with activity and some at rest. Because the discomfort was worse today he came to the emergency room where he was having some discomfort but had no acute EKG changes or initial enzyme changes. He was given sublingual nitroglycerin with resolution of his symptoms. He has not had these symptoms before.       Past Medical History  Diagnosis Date  . Hyperlipidemia   . Musculoskeletal pain     in the right shoulder - S/P ROTATOR CUFF REPAIR-BUT STILL HAS PAIN  . Laceration  01/11/2009     Deep laceration left index finger dorsally  . Diverticulosis   . Carotid stenosis   . Exogenous obesity   . Thyroiditis   . Diverticulitis   . Benign prostatic hypertrophy   . Arthritis   . GERD (gastroesophageal reflux disease)   . Rectal bleeding     PT ATTRIBUTES TO HEMORRHOIDS  . Difficulty urinating   . Obstructive sleep apnea      CPAP    AUTO SET 6 TO 20 - USUALLY SETTLES OUT AT 10  . Hernia     Umbulical   . Ulcer 2008  . H/O hiatal hernia   .  SUPRAVENTRICULAR TACHYCARDIA 08/01/2009    Qualifier: Diagnosis of  By: Tuck, CNA, Christy      Past Surgical History  Procedure Laterality Date  . Inguinal hernia repair    . Kidney stone surgery    . Tonsillectomy    . Colonoscopy      for polyps  . Rotator cuff repair    . Knee surgery    . Elbow bursa surgery    . Laparoscopic sigmoid colectomy  07/23/2012    Procedure: LAPAROSCOPIC SIGMOID COLECTOMY;  Surgeon: Steven C. Gross, MD;  Location: [facility];  Service: General;  Laterality: N/A;  Laparoscopic Sigmoid Colectomy  . Umbilical hernia repair  07/23/2012    Procedure: HERNIA REPAIR UMBILICAL ADULT;  Surgeon: Steven C. Gross, MD;  Location: [facility];  Service: General;  Laterality: N/A;  Primary Umbilical Hernia Repair  . Proctoscopy  07/23/2012    Procedure: PROCTOSCOPY;  Surgeon: Steven C. Gross, MD;  Location: [facility];  Service: General;  Laterality: N/A;  Rigid Proctoscopy    Allergies  Allergen Reactions  . Penicillins Rash    Over 60 years ago   No current facility-administered medications on file prior to encounter.   Current Outpatient Prescriptions on File Prior to Encounter  Medication Sig Dispense Refill  . aspirin 325 MG tablet Take 162.5 mg by mouth daily.       .   cholecalciferol (VITAMIN D) 1000 UNITS tablet Take 2,000 Units by mouth daily.       . Coenzyme Q10 (CO Q-10) 100 MG CAPS Take 100 mg by mouth daily.      . fish oil-omega-3 fatty acids 1000 MG capsule Take 1 g by mouth 2 (two) times daily.       . metoprolol (LOPRESSOR) 50 MG tablet Take 0.5 tablets (25 mg total) by mouth 2 (two) times daily.  90 tablet  3  . Multiple Vitamins-Minerals (CENTRUM SILVER PO) Take 1 tablet by mouth daily.      . NITROSTAT 0.4 MG SL tablet Place 0.4 mg under the tongue every 5 (five) minutes as needed. Chest pains      . omeprazole (PRILOSEC) 20 MG capsule Take 20 mg by mouth daily. EVERY AM BEFORE BREAKFAST      . OVER THE COUNTER MEDICATION Take 1 capsule by mouth daily.  Digestive advantage      . simvastatin (ZOCOR) 20 MG tablet Take 20 mg by mouth at bedtime.       . Tamsulosin HCl (FLOMAX) 0.4 MG CAPS Take 0.4 mg by mouth at bedtime.        History   Social History  . Marital Status: Married    Spouse Name: N/A    Number of Children: 4  . Years of Education: N/A   Occupational History  . Retired    Social History Main Topics  . Smoking status: Former Smoker    Types: Cigars    Quit date: 08/12/1999  . Smokeless tobacco: Not on file  . Alcohol Use: No  . Drug Use: No  . Sexual Activity: Not on file   Other Topics Concern  . Not on file   Social History Narrative   Lives at home with wife    Family History  Problem Relation Age of Onset  . Stroke Mother   . Heart disease Mother     Valvular heart disease, Rheumatic fever    ROS:  As stated in the HPI and negative for all other systems.  Physical Exam: Blood pressure 100/53, pulse 54, temperature 97.4 F (36.3 C), temperature source Oral, resp. rate 18, height 6' 0.5" (1.842 m), weight 252 lb (114.306 kg), SpO2 98.00%.  GENERAL:  Well appearing HEENT:  normal NECK:  No JVD LYMPHATICS:  No cervical, inguinal adenopathy LUNGS:  Clear to auscultation bilaterally BACK:  No CVA tenderness CHEST:  Unremarkable HEART:  PMI not displaced or sustained,S1 and S2 within normal limits, no S3, no S4, no clicks, no rubs, no murmurs ABD:  Flat, positive bowel sounds normal in frequency in pitch, no bruits, no rebound, no guarding, no midline pulsatile mass, no hepatomegaly, no splenomegaly EXT:  2 plus pulses throughout, no edema, no cyanosis no clubbing SKIN:  No rashes no nodules NEURO:  Cranial nerves II through XII grossly intact, motor grossly intact throughout PSYCH:  Cognitively intact, oriented to person place and time  Labs: Lab Results  Component Value Date   BUN 16 02/21/2013   Lab Results  Component Value Date   CREATININE 0.94 02/21/2013   Lab Results  Component Value Date    NA 138 02/21/2013   K 3.4* 02/21/2013   CL 104 02/21/2013   CO2 24 02/21/2013    Lab Results  Component Value Date   WBC 7.4 02/21/2013   HGB 13.9 02/21/2013   HCT 39.4 02/21/2013   MCV 92.7 02/21/2013   PLT 211 02/21/2013      Radiology:   CXR:  Minimal left basilar atelectasis.   EKG:  Sinus rhythm, rate 79, axis within normal limits, intervals within normal limits, no acute ST-T wave changes.   ASSESSMENT AND PLAN:    UNSTABLE ANGINA:  The risk of obstructive coronary disease is high. Cardiac catheterization is indicated. Will schedule for am.    SLEEP APNEA:  He can use his home CPAP  HYPERLIPIDEMIA:    Lab Results  Component Value Date   CHOL 121 02/21/2013   HDL 39* 02/21/2013   LDLCALC 61 02/21/2013   TRIG 106 02/21/2013   CHOLHDL 3.1 02/21/2013    HTN: For now he will be continued on his outpatient medications as well as nitroglycerin IV.   Philip J. Nahser, Jr., MD, FACC 02/21/2013, 8:54 AM Office - [phone] Pager [phone]    

## 2013-02-21 NOTE — Progress Notes (Signed)
ANTICOAGULATION CONSULT NOTE - Follow Up Consult  Pharmacy Consult for Heparin Indication: chest pain/ACS  Patient Measurements: Height: 6' 0.5" (184.2 cm) Weight: 252 lb (114.306 kg) IBW/kg (Calculated) : 78.75 Heparin Dosing Weight: 104 kg Vital Signs: Temp: 97.3 F (36.3 C) (08/24 1615) Temp src: Oral (08/24 1615) BP: 111/54 mmHg (08/24 1615) Pulse Rate: 54 (08/24 1615) Labs:  Recent Labs  02/20/13 1605 02/20/13 1617 02/20/13 2103 02/21/13 0212 02/21/13 0838 02/21/13 0845 02/21/13 1712  HGB 15.3  --   --  13.9 14.2  --   --   HCT 43.2  --   --  39.4 40.6  --   --   PLT 228  --   --  211 193  --   --   APTT  --  25  --   --   --   --   --   LABPROT  --  12.9  --   --   --   --   --   INR  --  0.99  --   --   --   --   --   HEPARINUNFRC  --   --   --  0.66 0.77*  --  0.65  CREATININE 1.03  --   --  0.94  --   --   --   TROPONINI  --   --  <0.30 <0.30  --  <0.30  --    Estimated Creatinine Clearance: 92.1 ml/min (by C-G formula based on Cr of 0.94).  Assessment: 72 YOM on IV heparin for ACS with plan for cardiac cath tomorrow.  Heparin level reported as 0.65.  No bleeding noted.    Goal of Therapy:  Heparin level 0.3-0.7 units/ml Monitor platelets by anticoagulation protocol: Yes   Plan:  1. Continue heparin at 1300 units/hr.  2. Daily heparin level.  Thank you for allowing pharmacy to be a part of this patients care team.  Lovenia Kim Pharm.D., BCPS Clinical Pharmacist 02/21/2013 5:47 PM Pager: [phone] Phone: [phone]

## 2013-02-22 ENCOUNTER — Encounter (HOSPITAL_COMMUNITY): Payer: Self-pay | Admitting: Nurse Practitioner

## 2013-02-22 ENCOUNTER — Encounter (HOSPITAL_COMMUNITY)
Admission: EM | Disposition: A | Discharge status: Home / Self Care | Payer: Self-pay | Source: Home / Self Care | Attending: Cardiology

## 2013-02-22 ENCOUNTER — Telehealth: Payer: Self-pay | Admitting: Cardiology

## 2013-02-22 DIAGNOSIS — I10 Essential (primary) hypertension: Secondary | ICD-10-CM

## 2013-02-22 DIAGNOSIS — I200 Unstable angina: Secondary | ICD-10-CM

## 2013-02-22 DIAGNOSIS — R079 Chest pain, unspecified: Secondary | ICD-10-CM

## 2013-02-22 HISTORY — PX: LEFT HEART CATHETERIZATION WITH CORONARY ANGIOGRAM: SHX5451

## 2013-02-22 LAB — CBC
HCT: 41.3 % (ref 39.0–52.0)
Hemoglobin: 13.9 g/dL (ref 13.0–17.0)
MCH: 31.8 pg (ref 26.0–34.0)
MCHC: 33.7 g/dL (ref 30.0–36.0)
RBC: 4.37 MIL/uL (ref 4.22–5.81)

## 2013-02-22 SURGERY — LEFT HEART CATHETERIZATION WITH CORONARY ANGIOGRAM
Anesthesia: LOCAL

## 2013-02-22 MED ORDER — NITROGLYCERIN 0.2 MG/ML ON CALL CATH LAB
INTRAVENOUS | Status: AC
  Filled 2013-02-22: qty 1

## 2013-02-22 MED ORDER — MIDAZOLAM HCL 2 MG/2ML IJ SOLN
INTRAMUSCULAR | Status: AC
  Filled 2013-02-22: qty 2

## 2013-02-22 MED ORDER — ASPIRIN 81 MG PO TABS: 81.0000 mg | ORAL_TABLET | Freq: Every day | ORAL | Status: DC

## 2013-02-22 MED ORDER — SODIUM CHLORIDE 0.9 % IV SOLN: INTRAVENOUS | Status: DC

## 2013-02-22 MED ORDER — VERAPAMIL HCL 2.5 MG/ML IV SOLN
INTRAVENOUS | Status: AC
  Filled 2013-02-22: qty 2

## 2013-02-22 MED ORDER — HEPARIN (PORCINE) IN NACL 2-0.9 UNIT/ML-% IJ SOLN
INTRAMUSCULAR | Status: AC
  Filled 2013-02-22: qty 1000

## 2013-02-22 MED ORDER — LIDOCAINE HCL (PF) 1 % IJ SOLN
INTRAMUSCULAR | Status: AC
  Filled 2013-02-22: qty 30

## 2013-02-22 NOTE — Interval H&P Note (Signed)
History and Physical Interval Note:  02/22/2013 1:42 PM  Cristian Moore  has presented today for surgery, with the diagnosis of cp  The various methods of treatment have been discussed with the patient and family. After consideration of risks, benefits and other options for treatment, the patient has consented to  Procedure(s): LEFT HEART CATHETERIZATION WITH CORONARY ANGIOGRAM (N/A) as a surgical intervention .  The patient's history has been reviewed, patient examined, no change in status, stable for surgery.  I have reviewed the patient's chart and labs.  Questions were answered to the patient's satisfaction.     [name]

## 2013-02-22 NOTE — Telephone Encounter (Signed)
Nurse needs to know asap if she's to give pt his lopressor

## 2013-02-22 NOTE — Progress Notes (Signed)
ANTICOAGULATION CONSULT NOTE - Follow Up Consult  Pharmacy Consult for Heparin Indication: chest pain/ACS  Patient Measurements: Height: 6' 0.5" (184.2 cm) Weight: 252 lb (114.306 kg) IBW/kg (Calculated) : 78.75 Heparin Dosing Weight: 104 kg Vital Signs: Temp: 97.5 F (36.4 C) (08/25 0558) Temp src: Oral (08/25 0558) BP: 146/70 mmHg (08/25 1026) Pulse Rate: 63 (08/25 1026) Labs:  Recent Labs  02/20/13 1605 02/20/13 1617 02/20/13 2103  02/21/13 0212 02/21/13 0838 02/21/13 0845 02/21/13 1712 02/22/13 0430  HGB 15.3  --   --   --  13.9 14.2  --   --  13.9  HCT 43.2  --   --   --  39.4 40.6  --   --  41.3  PLT 228  --   --   --  211 193  --   --  198  APTT  --  25  --   --   --   --   --   --   --   LABPROT  --  12.9  --   --   --   --   --   --   --   INR  --  0.99  --   --   --   --   --   --   --   HEPARINUNFRC  --   --   --   < > 0.66 0.77*  --  0.65 0.59  CREATININE 1.03  --   --   --  0.94  --   --   --   --   TROPONINI  --   --  <0.30  --  <0.30  --  <0.30  --   --   < > = values in this interval not displayed. Estimated Creatinine Clearance: 92.1 ml/min (by C-G formula based on Cr of 0.94).  Assessment: 72 YOM on IV heparin for ACS with plan for cardiac cath today.  Heparin drip 1300 uts/hr heparin level 0.59.  No bleeding noted.  CBC stable, VSS, Tp neg.    Goal of Therapy:  Heparin level 0.3-0.7 units/ml Monitor platelets by anticoagulation protocol: Yes   Plan:  1. Continue heparin at 1300 units/hr.  2. Daily heparin level. 3.  F/u after cath   Leota Sauers Pharm.D. CPP, BCPS Clinical Pharmacist [phone] 02/22/2013 12:14 PM

## 2013-02-22 NOTE — CV Procedure (Signed)
   Cardiac Catheterization Procedure Note  Name: Cristian Moore MRN: [id number] DOB: [birth year]  Procedure: Left Heart Cath, Selective Coronary Angiography, LV angiography  Indication:   Chest pain suggestive of unstable angina  Procedural details: The right radial was prepped, draped, and anesthetized with 1% lidocaine. Using modified Seldinger technique, a 5 French sheath was introduced into the right radial artery. Standard Judkins catheters were used for coronary angiography and left ventriculography. Catheter exchanges were performed over a guidewire. There were no immediate procedural complications. The patient was transferred to the post catheterization recovery area for further monitoring.  Procedural Findings:   Hemodynamics:     AO 112/50    LV 110/5   Coronary angiography:   Coronary dominance: Right  Left mainstem:   Normal  Left anterior descending (LAD):   Mild diffuse luminal irregularities.  Proximal focal 25% stenosis.  D1 large with proximal 25% stenosis.    Left circumflex (LCx):  AV groove normal.  OM large branching and normal.   Right coronary artery (RCA):  Large dominant.  Proximal focal 25% stenosis.  PDA large and normal.  PL moderate sized and normal.    Left ventriculography: Left ventricular systolic function is normal, LVEF is estimated at 55%, there is no significant mitral regurgitation   Final Conclusions:  Mild nonobstructive CAD.  NL LV function  Recommendations:   No further cardiac work up.  Possible PFTs as an outpatient.   [name] 02/22/2013, 1:42 PM

## 2013-02-22 NOTE — Telephone Encounter (Signed)
This is an inpatient. Spoke with Lyla Son and advised her to call Dr Antoine Poche [facility] Cath) or Dr Gala Romney [facility] DOD).

## 2013-02-22 NOTE — Progress Notes (Signed)
Utilization review completed.  

## 2013-02-22 NOTE — Progress Notes (Signed)
Pressure dressing applied, pt with positive allen's teeest

## 2013-02-22 NOTE — H&P (View-Only) (Signed)
CARDIOLOGY ADMISSION NOTE  Patient ID: Cristian Moore MRN: [id number] DOB/AGE: 1941/05/19 72 y.o.  Admit date: 02/20/2013 Primary Physician   Rudi Heap, MD Primary Cardiologist   Dr. Antoine Poche Chief Complaint    Chest pain, dyspnea  HPI:  The patient has a history of SVT.  He has no history of obstructive coronary disease. He had distant cardiac cath. The last stress test 2 years ago demonstrated a fixed small apical defect that would be consistent with apical thinning. He says he's been getting some shoulder discomfort for about a month.  He has been getting increasing dyspnea area this has been worsening and coming on with lighter activity. He'll get short of breath walking up stairs or a moderate distance on level ground. He has been getting some chest discomfort. He describes somewhat of a heaviness that has been mild to moderate in intensity. He has had some associated diaphoresis with this. He has had this discomfort reproducible with activity and some at rest. Because the discomfort was worse today he came to the emergency room where he was having some discomfort but had no acute EKG changes or initial enzyme changes. He was given sublingual nitroglycerin with resolution of his symptoms. He has not had these symptoms before.       Past Medical History  Diagnosis Date  . Hyperlipidemia   . Musculoskeletal pain     in the right shoulder - S/P ROTATOR CUFF REPAIR-BUT STILL HAS PAIN  . Laceration  01/11/2009     Deep laceration left index finger dorsally  . Diverticulosis   . Carotid stenosis   . Exogenous obesity   . Thyroiditis   . Diverticulitis   . Benign prostatic hypertrophy   . Arthritis   . GERD (gastroesophageal reflux disease)   . Rectal bleeding     PT ATTRIBUTES TO HEMORRHOIDS  . Difficulty urinating   . Obstructive sleep apnea      CPAP    AUTO SET 6 TO 20 - USUALLY SETTLES OUT AT 10  . Hernia     Umbulical   . Ulcer 2008  . H/O hiatal hernia   .  SUPRAVENTRICULAR TACHYCARDIA 08/01/2009    Qualifier: Diagnosis of  By: Marciano Sequin      Past Surgical History  Procedure Laterality Date  . Inguinal hernia repair    . Kidney stone surgery    . Tonsillectomy    . Colonoscopy      for polyps  . Rotator cuff repair    . Knee surgery    . Elbow bursa surgery    . Laparoscopic sigmoid colectomy  07/23/2012    Procedure: LAPAROSCOPIC SIGMOID COLECTOMY;  Surgeon: Ardeth Sportsman, MD;  Location: [facility] ORS;  Service: General;  Laterality: N/A;  Laparoscopic Sigmoid Colectomy  . Umbilical hernia repair  07/23/2012    Procedure: HERNIA REPAIR UMBILICAL ADULT;  Surgeon: Ardeth Sportsman, MD;  Location: [facility]S;  Service: General;  Laterality: N/A;  Primary Umbilical Hernia Repair  . Proctoscopy  07/23/2012    Procedure: PROCTOSCOPY;  Surgeon: Ardeth Sportsman, MD;  Location: [facility];  Service: General;  Laterality: N/A;  Rigid Proctoscopy    Allergies  Allergen Reactions  . Penicillins Rash    Over 60 years ago   No current facility-administered medications on file prior to encounter.   Current Outpatient Prescriptions on File Prior to Encounter  Medication Sig Dispense Refill  . aspirin 325 MG tablet Take 162.5 mg by mouth daily.       Marland Kitchen  cholecalciferol (VITAMIN D) 1000 UNITS tablet Take 2,000 Units by mouth daily.       . Coenzyme Q10 (CO Q-10) 100 MG CAPS Take 100 mg by mouth daily.      . fish oil-omega-3 fatty acids 1000 MG capsule Take 1 g by mouth 2 (two) times daily.       . metoprolol (LOPRESSOR) 50 MG tablet Take 0.5 tablets (25 mg total) by mouth 2 (two) times daily.  90 tablet  3  . Multiple Vitamins-Minerals (CENTRUM SILVER PO) Take 1 tablet by mouth daily.      Marland Kitchen NITROSTAT 0.4 MG SL tablet Place 0.4 mg under the tongue every 5 (five) minutes as needed. Chest pains      . omeprazole (PRILOSEC) 20 MG capsule Take 20 mg by mouth daily. EVERY AM BEFORE BREAKFAST      . OVER THE COUNTER MEDICATION Take 1 capsule by mouth daily.  Digestive advantage      . simvastatin (ZOCOR) 20 MG tablet Take 20 mg by mouth at bedtime.       . Tamsulosin HCl (FLOMAX) 0.4 MG CAPS Take 0.4 mg by mouth at bedtime.        History   Social History  . Marital Status: Married    Spouse Name: N/A    Number of Children: 4  . Years of Education: N/A   Occupational History  . Retired    Social History Main Topics  . Smoking status: Former Smoker    Types: Cigars    Quit date: 08/12/1999  . Smokeless tobacco: Not on file  . Alcohol Use: No  . Drug Use: No  . Sexual Activity: Not on file   Other Topics Concern  . Not on file   Social History Narrative   Lives at home with wife    Family History  Problem Relation Age of Onset  . Stroke Mother   . Heart disease Mother     Valvular heart disease, Rheumatic fever    ROS:  As stated in the HPI and negative for all other systems.  Physical Exam: Blood pressure 100/53, pulse 54, temperature 97.4 F (36.3 C), temperature source Oral, resp. rate 18, height 6' 0.5" (1.842 m), weight 252 lb (114.306 kg), SpO2 98.00%.  GENERAL:  Well appearing HEENT:  normal NECK:  No JVD LYMPHATICS:  No cervical, inguinal adenopathy LUNGS:  Clear to auscultation bilaterally BACK:  No CVA tenderness CHEST:  Unremarkable HEART:  PMI not displaced or sustained,S1 and S2 within normal limits, no S3, no S4, no clicks, no rubs, no murmurs ABD:  Flat, positive bowel sounds normal in frequency in pitch, no bruits, no rebound, no guarding, no midline pulsatile mass, no hepatomegaly, no splenomegaly EXT:  2 plus pulses throughout, no edema, no cyanosis no clubbing SKIN:  No rashes no nodules NEURO:  Cranial nerves II through XII grossly intact, motor grossly intact throughout PSYCH:  Cognitively intact, oriented to person place and time  Labs: Lab Results  Component Value Date   BUN 16 02/21/2013   Lab Results  Component Value Date   CREATININE 0.94 02/21/2013   Lab Results  Component Value Date    NA 138 02/21/2013   K 3.4* 02/21/2013   CL 104 02/21/2013   CO2 24 02/21/2013    Lab Results  Component Value Date   WBC 7.4 02/21/2013   HGB 13.9 02/21/2013   HCT 39.4 02/21/2013   MCV 92.7 02/21/2013   PLT 211 02/21/2013  Radiology:   CXR:  Minimal left basilar atelectasis.   EKG:  Sinus rhythm, rate 79, axis within normal limits, intervals within normal limits, no acute ST-T wave changes.   ASSESSMENT AND PLAN:    UNSTABLE ANGINA:  The risk of obstructive coronary disease is high. Cardiac catheterization is indicated. Will schedule for am.    SLEEP APNEA:  He can use his home CPAP  HYPERLIPIDEMIA:    Lab Results  Component Value Date   CHOL 121 02/21/2013   HDL 39* 02/21/2013   LDLCALC 61 02/21/2013   TRIG 106 02/21/2013   CHOLHDL 3.1 02/21/2013    HTN: For now he will be continued on his outpatient medications as well as nitroglycerin IV.   Vesta Mixer, Montez Hageman., MD, [facility] 02/21/2013, 8:54 AM Office - [phone] Pager [phone]

## 2013-02-22 NOTE — Progress Notes (Signed)
Nutrition Brief Note  Patient identified on the Malnutrition Screening Tool (MST) Report. Per EPIC weight hx, weights have been stable:  Wt Readings from Last 15 Encounters:  02/20/13 252 lb (114.306 kg)  02/20/13 252 lb (114.306 kg)  09/08/12 257 lb (116.574 kg)  08/10/12 253 lb 9.6 oz (115.032 kg)  07/26/12 257 lb 8 oz (116.8 kg)  07/26/12 257 lb 8 oz (116.8 kg)  07/15/12 255 lb (115.667 kg)  06/10/12 254 lb 8 oz (115.44 kg)  06/02/12 257 lb (116.574 kg)  02/18/12 263 lb (119.296 kg)  08/12/11 266 lb (120.657 kg)  05/21/11 260 lb (117.935 kg)  05/16/11 275 lb 2.2 oz (124.8 kg)  05/16/11 275 lb 2.2 oz (124.8 kg)  05/15/11 266 lb 8.6 oz (120.9 kg)    Body mass index is 33.69 kg/(m^2). Patient meets criteria for Obese Class I based on current BMI.   Current diet order is Heart Healthy, patient is consuming approximately 100% of meals at this time. Labs and medications reviewed.   No nutrition interventions warranted at this time. If nutrition issues arise, please consult RD.   Jarold Motto MS, RD, LDN Pager: [phone] After-hours pager: [phone]

## 2013-02-22 NOTE — Discharge Summary (Signed)
Patient ID: Cristian Moore,  MRN: [id number], DOB/AGE: 73-15-1942 73 y.o.  Admit date: 02/20/2013 Discharge date: 02/22/2013  Primary Care Provider: Rudi Heap Primary Cardiologist: Shela Commons. Hochrein, MD [facility])  Discharge Diagnoses Principal Problem:   Unstable angina  **s/p cardiac catheterization this admission revealing nonobstructive CAD.  Active Problems:   DYSLIPIDEMIA   Obesity (BMI 30-39.9)   Essential hypertension   Obstructive sleep apnea  Allergies Allergies  Allergen Reactions  . Penicillins Rash    Over 60 years ago   Procedures  Cardiac Catheterization 8.25.2014  Procedural Findings:              Hemodynamics:                                      AO 112/50                                     LV 110/5              Coronary angiography:    Coronary dominance: Right  Left mainstem:   Normal Left Anterior descending (LAD):   Mild diffuse luminal irregularities.  Proximal focal 25% stenosis.  D1 large with proximal 25% stenosis.    Left circumflex (LCx):  AV groove normal.  OM large branching and normal.   Right coronary artery (RCA):  Large dominant.  Proximal focal 25% stenosis.  PDA large and normal.  PL moderate sized and normal.     Left ventriculography: Left ventricular systolic function is normal, LVEF is estimated at 55%, there is no significant mitral regurgitation   Final Conclusions:  Mild nonobstructive CAD.  NL LV function _____________   History of Present Illness  72 y/o male with a h/o SVT, HTN, and hyperlipidemia, who was in his usual state of health until approximately 1 month ago, when he began to note intermittent shoulder discomfort along with increasing dyspnea on exertion.  Symptoms have progressed some and his exercise tolerance has been reduced.  On the day of admission, his shoulder discomfort was worse than usual and he presented to the [facility] ED.  There, pain resolved with sublingual nitroglycerin.  ECG was non-acute and initial  troponin was normal.  He was admitted for further evaluation.  Hospital Course  Patient ruled out for MI. He had no further chest pain .  Secondary to risk factors and symptoms concerning for angina, decision was made to pursue diagnostic catheterization.  This was performed this AM and showed nonobstructive CAD.  As a result, he will continue to be medically managed and no further cardiac workup is planned.  He will be discharged home today in good condition.  Discharge Vitals Blood pressure 110/57, pulse 58, temperature 97.5 F (36.4 C), temperature source Oral, resp. rate 18, height 6' 0.5" (1.842 m), weight 252 lb (114.306 kg), SpO2 98.00%.  Filed Weights   02/20/13 1533 02/20/13 2017  Weight: 252 lb (114.306 kg) 252 lb (114.306 kg)   Labs  CBC  Recent Labs  02/21/13 0838 02/22/13 0430  WBC 6.8 6.9  HGB 14.2 13.9  HCT 40.6 41.3  MCV 93.8 94.5  PLT 193 198   Basic Metabolic Panel  Recent Labs  02/20/13 1605 02/21/13 0212 02/22/13 0848  NA 139 138  --   K 3.7 3.4* 4.1  CL 102 104  --  CO2 24 24  --   GLUCOSE 101* 136*  --   BUN 16 16  --   CREATININE 1.03 0.94  --   CALCIUM 9.9 9.2  --    Cardiac Enzymes  Recent Labs  02/20/13 2103 02/21/13 0212 02/21/13 0845  TROPONINI <0.30 <0.30 <0.30   Fasting Lipid Panel  Recent Labs  02/21/13 0212  CHOL 121  HDL 39*  LDLCALC 61  TRIG 161  CHOLHDL 3.1   Thyroid Function Tests  Recent Labs  02/20/13 2106  TSH 0.677   Disposition  Pt is being discharged home today in good condition.  Follow-up Plans & Appointments  Follow-up Information   Follow up with Rudi Heap, MD In 2 weeks.   Specialty:  Family Medicine   Contact information:   [address] [place] [address] [phone]      Discharge Medications    Medication List    STOP taking these medications       OVER THE COUNTER MEDICATION      TAKE these medications       aspirin 81 MG tablet  Take 1 tablet (81 mg total)  by mouth daily.     CENTRUM SILVER PO  Take 1 tablet by mouth daily.     cholecalciferol 1000 UNITS tablet  Commonly known as:  VITAMIN D  Take 2,000 Units by mouth daily.     Co Q-10 100 MG Caps  Take 100 mg by mouth daily.     fish oil-omega-3 fatty acids 1000 MG capsule  Take 1 g by mouth 2 (two) times daily.     metoprolol 50 MG tablet  Commonly known as:  LOPRESSOR  Take 0.5 tablets (25 mg total) by mouth 2 (two) times daily.     NITROSTAT 0.4 MG SL tablet  Generic drug:  nitroGLYCERIN  Place 0.4 mg under the tongue every 5 (five) minutes as needed. Chest pains     omeprazole 20 MG capsule  Commonly known as:  PRILOSEC  Take 20 mg by mouth daily. EVERY AM BEFORE BREAKFAST     simvastatin 20 MG tablet  Commonly known as:  ZOCOR  Take 20 mg by mouth at bedtime.     sodium chloride 0.65 % nasal spray  Commonly known as:  OCEAN  Place 1 spray into the nose as needed for congestion.     sucralfate 1 G tablet  Commonly known as:  CARAFATE  Take 1 g by mouth 2 (two) times daily.     tamsulosin 0.4 MG Caps capsule  Commonly known as:  FLOMAX  Take 0.4 mg by mouth at bedtime.     zolpidem 10 MG tablet  Commonly known as:  AMBIEN  Take 10 mg by mouth at bedtime as needed for sleep.       Outstanding Labs/Studies  None  Duration of Discharge Encounter   Greater than 30 minutes including physician time.  Signed, Nicolasa Ducking NP 02/22/2013, 2:23 PM

## 2013-02-22 NOTE — Discharge Summary (Signed)
Went over all discharge instructions including current and new medications.  Also reviewed MyChart information with patient and way to register.  All questions answered concerning discharge.  All IV's out.

## 2013-03-04 ENCOUNTER — Encounter: Payer: Self-pay | Admitting: *Deleted

## 2013-03-04 ENCOUNTER — Other Ambulatory Visit: Payer: Self-pay

## 2013-03-04 NOTE — Telephone Encounter (Signed)
Last seen 08/05/12  DWM  If approved print for mail order and have nurse call the patient

## 2013-03-05 ENCOUNTER — Encounter: Payer: Self-pay | Admitting: Physician Assistant

## 2013-03-05 ENCOUNTER — Ambulatory Visit (INDEPENDENT_AMBULATORY_CARE_PROVIDER_SITE_OTHER): Payer: Medicare Other | Admitting: Physician Assistant

## 2013-03-05 VITALS — BP 123/70 | HR 69 | Ht 72.5 in | Wt 257.0 lb

## 2013-03-05 DIAGNOSIS — I6529 Occlusion and stenosis of unspecified carotid artery: Secondary | ICD-10-CM

## 2013-03-05 DIAGNOSIS — I10 Essential (primary) hypertension: Secondary | ICD-10-CM

## 2013-03-05 DIAGNOSIS — R079 Chest pain, unspecified: Secondary | ICD-10-CM

## 2013-03-05 DIAGNOSIS — R0602 Shortness of breath: Secondary | ICD-10-CM

## 2013-03-05 DIAGNOSIS — I2510 Atherosclerotic heart disease of native coronary artery without angina pectoris: Secondary | ICD-10-CM | POA: Insufficient documentation

## 2013-03-05 DIAGNOSIS — E785 Hyperlipidemia, unspecified: Secondary | ICD-10-CM

## 2013-03-05 MED ORDER — OMEPRAZOLE 20 MG PO CPDR: 20.0000 mg | DELAYED_RELEASE_CAPSULE | Freq: Every day | ORAL | Status: DC

## 2013-03-05 NOTE — Progress Notes (Signed)
[address] [place], [place]  [address] Phone: [phone] Fax:  [phone]  Date:  03/05/2013   ID:  Cristian Moore, DOB 09-04-1940, MRN [id number]  PCP:  Rudi Heap, MD  Cardiologist:  Dr. [name] [facility])    History of Present Illness: Cristian Moore is a 72 y.o. male who returns for follow up after a recent admission for chest pain concerning for [place].  He has a hx of HTN, HL, SVT, OSA.  Echo 11/2001:  EF 50-55%, septal HK.  Carotid US 06/2011:  bilat 0-39% (f/u 05/2013).  Myoview 05/2011:  Inf and apical thinning, no ischemia, EF 55%.  He was admitted 8/23-8/25 after presenting for L shoulder pain assoc with dyspnea and decreased exercise tolerance.  He ruled out for MI.  LHC 02/22/13:  pLAD 25, pD1 25, pRCA 25, EF 55.      Since discharge, he has had an occasional chest pain. This will last a few minutes. He denies exertional symptoms. He denies associated radiation, nausea or diaphoresis. Of note, he stopped taking his Prilosec on a regular basis some time ago. He continues to note episodes of dyspnea with exertion. He probably describes NYHA class II-IIb symptoms. Feels like he cannot take a deep breath.  He denies orthopnea. He denies PND. He denies syncope. He does note a cough. This is typically in the mornings. He has thought he has wheezed in the past.  Labs (8/14):  K 4.1, Cr 0.94, LDL 61, Hgb 13.9, TSH 0.677, pro BNP 12.2   Wt Readings from Last 3 Encounters:  03/05/13 257 lb (116.574 kg)  02/20/13 252 lb (114.306 kg)  02/20/13 252 lb (114.306 kg)     Past Medical History  Diagnosis Date  . Hyperlipidemia   . Musculoskeletal pain     in the right shoulder - S/P ROTATOR CUFF REPAIR-BUT STILL HAS PAIN  . Laceration  01/11/2009     Deep laceration left index finger dorsally  . Diverticulosis   . Carotid stenosis   . Exogenous obesity   . Thyroiditis   . Diverticulitis   . Benign prostatic hypertrophy   . Arthritis   . GERD  (gastroesophageal reflux disease)   . Rectal bleeding     PT ATTRIBUTES TO HEMORRHOIDS  . Difficulty urinating   . Obstructive sleep apnea      CPAP    AUTO SET 6 TO 20 - USUALLY SETTLES OUT AT 10  . Hernia     Umbulical   . Ulcer 2008  . H/O hiatal hernia   . SUPRAVENTRICULAR TACHYCARDIA 08/01/2009    Qualifier: Diagnosis of  By: Cristela Felt, CNA, Christy    . Chest pain     a. 01/2013 Cath: LM nl, LAD 25p, D1 25p, LCX nl, RCA 25p, PDA nl, RPL nl, EF 55%.    Current Outpatient Prescriptions  Medication Sig Dispense Refill  . aspirin 81 MG tablet Take 1 tablet (81 mg total) by mouth daily.      . cholecalciferol (VITAMIN D) 1000 UNITS tablet Take 2,000 Units by mouth daily.       . Coenzyme Q10 (CO Q-10) 100 MG CAPS Take 100 mg by mouth daily.      . fish oil-omega-3 fatty acids 1000 MG capsule Take 1 g by mouth 2 (two) times daily.       . metoprolol (LOPRESSOR) 50 MG tablet Take 0.5 tablets (25 mg total) by mouth 2 (two) times daily.  90 tablet  3  . Multiple Vitamins-Minerals (CENTRUM SILVER PO) Take 1 tablet by mouth daily.      Marland Kitchen NITROSTAT 0.4 MG SL tablet Place 0.4 mg under the tongue every 5 (five) minutes as needed. Chest pains      . omeprazole (PRILOSEC) 20 MG capsule Take 20 mg by mouth daily. EVERY AM BEFORE BREAKFAST      . simvastatin (ZOCOR) 20 MG tablet Take 20 mg by mouth at bedtime.       . sodium chloride (OCEAN) 0.65 % nasal spray Place 1 spray into the nose as needed for congestion.      . sucralfate (CARAFATE) 1 G tablet Take 1 g by mouth 2 (two) times daily.      . Tamsulosin HCl (FLOMAX) 0.4 MG CAPS Take 0.4 mg by mouth at bedtime.       Marland Kitchen zolpidem (AMBIEN) 10 MG tablet Take 10 mg by mouth at bedtime as needed for sleep.       No current facility-administered medications for this visit.    Allergies:    Allergies  Allergen Reactions  . Penicillins Rash    Over 60 years ago    Social History:  The patient  reports that he quit smoking about 13 years ago. His  smoking use included Cigars. He does not have any smokeless tobacco history on file. He reports that he does not drink alcohol or use illicit drugs.   ROS:  Please see the history of present illness.   No melena, hematochezia, hematuria.   All other systems reviewed and negative.   PHYSICAL EXAM: VS:  BP 123/70  Pulse 69  Ht 6' 0.5" (1.842 m)  Wt 257 lb (116.574 kg)  BMI 34.36 kg/m2 Well nourished, well developed, in no acute distress HEENT: normal Neck: no JVD Cardiac:  normal S1, S2; RRR; no murmur Lungs:  clear to auscultation bilaterally, no wheezing, rhonchi or rales Abd: soft, nontender, no hepatomegaly Ext: no edema; right wrist without hematoma or mass  Skin: warm and dry Neuro:  CNs 2-12 intact, no focal abnormalities noted  EKG:  NSR, HR 69, normal axis, no acute changes     ASSESSMENT AND PLAN:  1. Chest Pain:   Noncardiac. He has a history of GERD and stopped taking his PPI on a regular basis recently. I have asked him to restart his Prilosec on a daily basis. No further cardiac workup at this time. 2. Dyspnea: Question if he has COPD. He is a prior smoker. He works on Chi[employer] around [employer] in [place]. He does have a history of sleep apnea. He has followup with his PCP next week. He will discuss with him whether or not to pursue pulmonary function testing. He may need to followup with Dr. Maple Hudson, whom he has seen in the past for sleep apnea. 3. CAD:   Nonobstructive by recent cardiac catheterization. Continue aspirin and statin. 4. Hypertension:   Controlled. 5. Hyperlipidemia:   Continue statin. 6. Carotid Stenosis:   Arrange f/u dopplers in 05/2013. 7. Disposition:  Follow up with Dr. Antoine Poche in [place] in 3 months.   Signed, Tereso Newcomer, PA-C  03/05/2013 9:15 AM

## 2013-03-05 NOTE — Patient Instructions (Addendum)
START PRILOSEC EVERYDAY  WHEN YOU FOLLOW UP WITH DR. Christell Constant MAKE SURE TO DISCUSS WHETHER OR NOT TO DO PFT'S   PLEASE FOLLOW UP WITH DR. HOCHREIN IN 3 MONTHS IN [place]  YOU WILL NEED TO HAVE CAROTID DOPPLERS IN 05/2013; DX 433.10 BILATERAL

## 2013-03-09 ENCOUNTER — Ambulatory Visit (INDEPENDENT_AMBULATORY_CARE_PROVIDER_SITE_OTHER): Payer: Medicare Other | Admitting: Family Medicine

## 2013-03-09 ENCOUNTER — Encounter: Payer: Self-pay | Admitting: Family Medicine

## 2013-03-09 VITALS — BP 120/77 | HR 61 | Temp 99.2°F | Ht 72.5 in | Wt 257.0 lb

## 2013-03-09 DIAGNOSIS — I2510 Atherosclerotic heart disease of native coronary artery without angina pectoris: Secondary | ICD-10-CM

## 2013-03-09 DIAGNOSIS — R0602 Shortness of breath: Secondary | ICD-10-CM

## 2013-03-09 DIAGNOSIS — I10 Essential (primary) hypertension: Secondary | ICD-10-CM

## 2013-03-09 DIAGNOSIS — E785 Hyperlipidemia, unspecified: Secondary | ICD-10-CM

## 2013-03-09 NOTE — Progress Notes (Signed)
  Subjective:    Patient ID: Cristian Moore, male    DOB: 1940/10/08, 72 y.o.   MRN: [id number]  HPI Patient is here today for followup from a hospital visit for chest pain. The workup was negative for obstructive coronary disease. He asked resolve the cardiologist recently from a followup of a hospital visit. The note emphasized that the chest pain was noncardiac in origin. Recent hospital notes and outpatient visits were reviewed with patient   Review of Systems  Constitutional: Positive for fatigue.  HENT: Positive for postnasal drip.   Eyes: Negative.   Respiratory: Positive for shortness of breath (better today).   Cardiovascular: Negative.   Gastrointestinal: Negative.   Endocrine: Negative.   Genitourinary: Negative.   Musculoskeletal: Positive for arthralgias (shoulders hurt - left is worse).  Skin: Negative.   Allergic/Immunologic: Negative.   Neurological: Positive for light-headedness. Negative for headaches.  Hematological: Negative.   Psychiatric/Behavioral: Negative.        Objective:   Physical Exam  Nursing note and vitals reviewed. Constitutional: He is oriented to person, place, and time. He appears well-developed and well-nourished. No distress.  HENT:  Head: Normocephalic and atraumatic.  Right Ear: External ear normal.  Left Ear: External ear normal.  Nose: Nose normal.  Mouth/Throat: Oropharynx is clear and moist.  The inferior portion of the right ear canal is red and irritated  Eyes: Conjunctivae are normal. Right eye exhibits no discharge. Left eye exhibits no discharge. Scleral icterus is present.  Neck: Normal range of motion. Neck supple. No thyromegaly present.  Cardiovascular: Normal rate, regular rhythm and normal heart sounds.  Exam reveals no gallop and no friction rub.   No murmur heard. At 72 per minute  Pulmonary/Chest: Effort normal and breath sounds normal. He has no wheezes. He has no rales.  Abdominal: Soft. Bowel sounds are normal.  He exhibits no distension and no mass. There is no tenderness. There is no rebound and no guarding.  Obese  Musculoskeletal: Normal range of motion. He exhibits no edema.  Lymphadenopathy:    He has no cervical adenopathy.  Neurological: He is alert and oriented to person, place, and time.  Skin: Skin is warm and dry. No rash noted. No erythema.  Psychiatric: He has a normal mood and affect. His behavior is normal. Judgment and thought content normal.          Assessment & Plan:  1. Non-Obstructive CAD  2. Essential hypertension  3. DYSLIPIDEMIA  4. Shortness of breath -Referral to Dr. Jetty Duhamel  Patient Instructions  Keep appointment with Dr. Caryn Section in about 3 months Get repeat carotid Dopplers in December of this year Do not forget to get your flu shot in October Continue aggressive therapeutic lifestyle changes which include diet and exercise Continue statin drug therapy Did not put yourself at risk for falling We will arrange a referral to Dr. Jetty Duhamel, pulmonologist for pulmonary function testing and evaluation of shortness of breath as he has seen him in the past   Nyra Capes MD

## 2013-03-09 NOTE — Patient Instructions (Addendum)
Keep appointment with Dr. Caryn Section in about 3 months Get repeat carotid Dopplers in December of this year Do not forget to get your flu shot in October Continue aggressive therapeutic lifestyle changes which include diet and exercise Continue statin drug therapy Did not put yourself at risk for falling We will arrange a referral to Dr. Jetty Duhamel, pulmonologist for pulmonary function testing and evaluation of shortness of breath as he has seen him in the past

## 2013-03-15 ENCOUNTER — Other Ambulatory Visit: Payer: Self-pay

## 2013-03-15 MED ORDER — ZOLPIDEM TARTRATE 10 MG PO TABS: 10.0000 mg | ORAL_TABLET | Freq: Every evening | ORAL | Status: DC | PRN

## 2013-03-15 NOTE — Telephone Encounter (Signed)
last seen 03/08/13  DWM  Patient would like this medication through mail order  If approved print and have nurse call patient to pick up

## 2013-03-15 NOTE — Telephone Encounter (Signed)
This is okay with 2 refill Tell patient to try to take no more than one half nightly

## 2013-03-16 NOTE — Telephone Encounter (Signed)
Rx Called into [facility]

## 2013-03-19 ENCOUNTER — Encounter: Payer: Self-pay | Admitting: Internal Medicine

## 2013-03-19 ENCOUNTER — Ambulatory Visit (INDEPENDENT_AMBULATORY_CARE_PROVIDER_SITE_OTHER): Payer: Medicare Other | Admitting: Internal Medicine

## 2013-03-19 VITALS — BP 118/64 | HR 60 | Temp 97.3°F | Ht 72.5 in | Wt 255.0 lb

## 2013-03-19 DIAGNOSIS — Z23 Encounter for immunization: Secondary | ICD-10-CM

## 2013-03-19 DIAGNOSIS — R0602 Shortness of breath: Secondary | ICD-10-CM

## 2013-03-19 NOTE — Assessment & Plan Note (Signed)
-   spirometry wnl   Symptoms are markedly disproportionate to objective findings and not clear this is a lung problem but pt does appear to have difficult airway management issues. DDX of  difficult airways managment all start with A and  include Adherence, Ace Inhibitors, Acid Reflux, Active Sinus Disease, Alpha 1 Antitripsin deficiency, Anxiety masquerading as Airways dz,  ABPA,  allergy(esp in young), Aspiration (esp in elderly), Adverse effects of DPI,  Active smokers, plus two Bs  = Bronchiectasis and Beta blocker use..and one C= CHF  Acid reflux at top of list of suspects > always difficult to exclude as up to 75% of pts in some series report no assoc GI/ Heartburn symptoms> rec max (24h)  acid suppression and diet restrictions/ reviewed and instructions given in writting   ? Anxiety/ panic disorder usually a dx of exclusion > reassurance

## 2013-03-19 NOTE — Patient Instructions (Addendum)
Omeprazole Take 30- 60 min before your first and last meals of the day   GERD (REFLUX)  is an extremely common cause of respiratory symptoms, many times with no significant heartburn at all.    It can be treated with medication, but also with lifestyle changes including avoidance of late meals, excessive alcohol, smoking cessation, and avoid fatty foods, chocolate, peppermint, colas, red wine, and acidic juices such as orange juice.  NO MINT OR MENTHOL PRODUCTS SO NO COUGH DROPS  USE SUGARLESS CANDY INSTEAD (jolley ranchers or Stover's)  NO OIL BASED VITAMINS - use powdered substitutes.  Pulmonary follow up is as needed

## 2013-03-19 NOTE — Progress Notes (Addendum)
Subjective:    Patient ID: Cristian Moore, male    DOB: [birth year]  MRN: [id number]  HPI  49 yowm quit smoking 2001 with h/o hyperventilation syndrome in 20's and breathing some after quit smoking then worse again abruptly Aug 21 > aug 22 ER  [facility]   Admit date: 02/20/2013  Discharge date: 02/22/2013  Primary Care Provider: Rudi Heap  Primary Cardiologist: J. Hochrein, MD [facility])  Discharge Diagnoses  Principal Problem:  Unstable angina  **s/p cardiac catheterization this admission revealing nonobstructive CAD.  Active Problems:  DYSLIPIDEMIA  Obesity (BMI 30-39.9)  Essential hypertension  Obstructive sleep apnea  Allergies  Allergies   Allergen  Reactions   .  Penicillins  Rash     Over 60 years ago   Procedures  Cardiac Catheterization 8.25.2014  Procedural Findings:  Hemodynamics:  AO 112/50  LV 110/5  Coronary angiography:  Coronary dominance: Right  Left mainstem: Normal  Left Anterior descending (LAD): Mild diffuse luminal irregularities. Proximal focal 25% stenosis. D1 large with proximal 25% stenosis.  Left circumflex (LCx): AV groove normal. OM large branching and normal.  Right coronary artery (RCA): Large dominant. Proximal focal 25% stenosis. PDA large and normal. PL moderate sized and normal.  Left ventriculography: Left ventricular systolic function is normal, LVEF is estimated at 55%, there is no significant mitral regurgitation  Final Conclusions: Mild nonobstructive CAD. NL LV function  _____________  History of Present Illness  72 y/o male with a h/o SVT, HTN, and hyperlipidemia, who was in his usual state of health until approximately 1 month ago, when he began to note intermittent shoulder discomfort along with increasing dyspnea on exertion. Symptoms have progressed some and his exercise tolerance has been reduced. On the day of admission, his shoulder discomfort was worse than usual and he presented to the [facility] ED. There, pain resolved  with sublingual nitroglycerin. ECG was non-acute and initial troponin was normal. He was admitted for further evaluation.  Hospital Course  Patient ruled out for MI. He had no further chest pain . Secondary to risk factors and symptoms concerning for angina, decision was made to pursue diagnostic catheterization. This was performed this AM and showed nonobstructive CAD. As a result, he will continue to be medically managed and no further cardiac workup is planned. He will be discharged home today in good condition.   03/19/2013 1st [place] [facility] office visit/ Emmah Bratcher cc intermittent sob at rest abrupt onset 02/18/13, happens walking, never sleeping, assoc with sensation can't get a good deep breath just like in 20's, better cpap day or night - stopped ppi 1-2 weeks before onset of symptoms and better since restarted but still present s overt hb, some nasal and throat "congestion" but no excess or purlent secretions  No obvious day to day or daytime variabilty or assoc chronic cough or cp or chest tightness, subjective wheeze overt sinus or hb symptoms. No unusual exp hx or h/o childhood pna/ asthma or knowledge of premature birth.  Sleeping ok without nocturnal  or early am exacerbation  of respiratory  c/o's or need for noct saba. Also denies any obvious fluctuation of symptoms with weather or environmental changes or other aggravating or alleviating factors except as outlined above   Current Medications, Allergies, Complete Past Medical History, Past Surgical History, Family History, and Social History were reviewed in [employer] record.          Review of Systems  Constitutional: Negative for fever, chills, activity change, appetite  change and unexpected weight change.  HENT: Positive for congestion. Negative for sore throat, rhinorrhea, sneezing, trouble swallowing, dental problem, voice change and postnasal drip.   Eyes: Negative for visual disturbance.  Respiratory:  Positive for shortness of breath. Negative for cough and choking.   Cardiovascular: Negative for chest pain and leg swelling.  Gastrointestinal: Negative for nausea, vomiting and abdominal pain.  Genitourinary: Negative for difficulty urinating.       Heartburn  Musculoskeletal: Negative for arthralgias.  Skin: Negative for rash.  Psychiatric/Behavioral: Negative for behavioral problems and confusion.       Objective:   Physical Exam   amb anxious obese wm nad with occ sigh breathing   Wt Readings from Last 3 Encounters:  03/19/13 255 lb (115.667 kg)  03/09/13 257 lb (116.574 kg)  03/05/13 257 lb (116.574 kg)     HEENT: nl dentition, turbinates, and orophanx. Nl external ear canals without cough reflex   NECK :  without JVD/Nodes/TM/ nl carotid upstrokes bilaterally   LUNGS: no acc muscle use, clear to A and P bilaterally without cough on insp or exp maneuvers   CV:  RRR  no s3 or murmur or increase in P2, no edema   ABD:  soft and nontender with nl excursion in the supine position. No bruits or organomegaly, bowel sounds nl  MS:  warm without deformities, calf tenderness, cyanosis or clubbing  SKIN: warm and dry without lesions    NEURO:  alert, approp, no deficits    8/23/14pcxr Minimal left basilar atelectasis.  CTa 2013 and 2012 both for "sob" both neg for PE       Assessment & Plan:

## 2013-04-07 ENCOUNTER — Other Ambulatory Visit: Payer: Self-pay | Admitting: *Deleted

## 2013-04-07 MED ORDER — SIMVASTATIN 20 MG PO TABS: 20.0000 mg | ORAL_TABLET | Freq: Every day | ORAL | Status: DC

## 2013-04-07 MED ORDER — ZOLPIDEM TARTRATE 10 MG PO TABS: 10.0000 mg | ORAL_TABLET | Freq: Every evening | ORAL | Status: DC | PRN

## 2013-04-07 MED ORDER — TAMSULOSIN HCL 0.4 MG PO CAPS: 0.4000 mg | ORAL_CAPSULE | Freq: Every day | ORAL | Status: DC

## 2013-05-17 ENCOUNTER — Other Ambulatory Visit: Payer: Self-pay | Admitting: Internal Medicine

## 2013-05-17 ENCOUNTER — Ambulatory Visit
Admission: RE | Admit: 2013-05-17 | Discharge: 2013-05-17 | Disposition: A | Discharge status: Home / Self Care | Payer: Non-veteran care | Source: Ambulatory Visit | Attending: Internal Medicine | Admitting: Internal Medicine

## 2013-05-17 DIAGNOSIS — M545 Low back pain, unspecified: Secondary | ICD-10-CM

## 2013-05-18 ENCOUNTER — Other Ambulatory Visit: Payer: Medicare Other

## 2013-05-18 DIAGNOSIS — H0230 Blepharochalasis unspecified eye, unspecified eyelid: Secondary | ICD-10-CM | POA: Insufficient documentation

## 2013-05-18 DIAGNOSIS — H53453 Other localized visual field defect, bilateral: Secondary | ICD-10-CM | POA: Insufficient documentation

## 2013-05-18 DIAGNOSIS — H53459 Other localized visual field defect, unspecified eye: Secondary | ICD-10-CM | POA: Insufficient documentation

## 2013-05-25 ENCOUNTER — Telehealth: Payer: Self-pay | Admitting: Cardiology

## 2013-05-25 NOTE — Telephone Encounter (Signed)
OK to hold ASA as needed for the procedure.   

## 2013-05-25 NOTE — Telephone Encounter (Signed)
Left message for Cristian Moore OK to hold ASA as needed and to call back with questions needs or concerns.

## 2013-05-25 NOTE — Telephone Encounter (Signed)
Will forward to MD for review

## 2013-05-25 NOTE — Telephone Encounter (Signed)
New problem   Pt is having eyelid sx 06/09/13 and need to knows if pt can come off Asprin 10 day before and 10 days after sx. Please advise.

## 2013-05-31 HISTORY — PX: OTHER SURGICAL HISTORY: SHX169

## 2013-06-03 DIAGNOSIS — Z8679 Personal history of other diseases of the circulatory system: Secondary | ICD-10-CM | POA: Insufficient documentation

## 2013-06-03 DIAGNOSIS — K449 Diaphragmatic hernia without obstruction or gangrene: Secondary | ICD-10-CM | POA: Insufficient documentation

## 2013-06-03 DIAGNOSIS — K219 Gastro-esophageal reflux disease without esophagitis: Secondary | ICD-10-CM | POA: Insufficient documentation

## 2013-06-11 ENCOUNTER — Encounter (HOSPITAL_COMMUNITY): Payer: Non-veteran care

## 2013-06-14 ENCOUNTER — Telehealth: Payer: Self-pay | Admitting: Family Medicine

## 2013-06-15 ENCOUNTER — Encounter: Payer: Self-pay | Admitting: Family Medicine

## 2013-06-15 ENCOUNTER — Other Ambulatory Visit: Payer: Self-pay

## 2013-06-15 ENCOUNTER — Ambulatory Visit (INDEPENDENT_AMBULATORY_CARE_PROVIDER_SITE_OTHER): Payer: Medicare Other | Admitting: Family Medicine

## 2013-06-15 VITALS — BP 110/68 | HR 79 | Temp 98.8°F | Ht 72.5 in | Wt 257.0 lb

## 2013-06-15 DIAGNOSIS — J329 Chronic sinusitis, unspecified: Secondary | ICD-10-CM

## 2013-06-15 DIAGNOSIS — H6091 Unspecified otitis externa, right ear: Secondary | ICD-10-CM

## 2013-06-15 DIAGNOSIS — H60399 Other infective otitis externa, unspecified ear: Secondary | ICD-10-CM

## 2013-06-15 MED ORDER — METOPROLOL TARTRATE 50 MG PO TABS: 25.0000 mg | ORAL_TABLET | Freq: Two times a day (BID) | ORAL | Status: DC

## 2013-06-15 MED ORDER — DOXYCYCLINE HYCLATE 100 MG PO CAPS: 100.0000 mg | ORAL_CAPSULE | Freq: Two times a day (BID) | ORAL | Status: DC

## 2013-06-15 MED ORDER — NEOMYCIN-POLYMYXIN-HC 3.5-10000-1 OT SOLN: 4.0000 [drp] | Freq: Four times a day (QID) | OTIC | Status: DC

## 2013-06-15 NOTE — Telephone Encounter (Signed)
Last seen 03/09/13  DWM  If approved print for mail order and route to nurse

## 2013-06-15 NOTE — Progress Notes (Signed)
   Subjective:    Patient ID: Cristian Moore, male    DOB: Jan 04, 1941, 72 y.o.   MRN: [id number]  HPI URI Symptoms Onset: 2-3 weeks  Description: sinus pressure, nasal congestion, cough, R ear pain  Modifying factors:  None   Symptoms Nasal discharge: yes Fever: no Sore throat: no Cough: yes Wheezing: no Ear pain: yes; R ear  GI symptoms: no Sick contacts: no  Red Flags  Stiff neck: no Dyspnea: no Rash: no Swallowing difficulty: no  Sinusitis Risk Factors Headache/face pain: mild Double sickening: no tooth pain: no  Allergy Risk Factors Sneezing: no Itchy scratchy throat: no Seasonal symptoms: no  Flu Risk Factors Headache: no muscle aches: no severe fatigue: no     Review of Systems  All other systems reviewed and are negative.       Objective:   Physical Exam  Constitutional: He appears well-developed and well-nourished.  HENT:  Head: Normocephalic and atraumatic.  Left Ear: External ear normal.  R ear canal erythema and tenderness to otoscopic evaluation +nasal erythema, rhinorrhea bilaterally, + post oropharyngeal erythema      Eyes: Conjunctivae are normal. Pupils are equal, round, and reactive to light.  Neck: Normal range of motion. Neck supple.  Cardiovascular: Normal rate and regular rhythm.   Pulmonary/Chest: Effort normal and breath sounds normal.  Abdominal: Soft.  Musculoskeletal: Normal range of motion.  Neurological: He is alert.  Skin: Skin is warm.          Assessment & Plan:  Sinusitis - Plan: doxycycline (VIBRAMYCIN) 100 MG capsule  OE (otitis externa), right - Plan: neomycin-polymyxin-hydrocortisone (CORTISPORIN) otic solution  Will treat with doxy and cortisporin otic  Discussed infectious and ENT red flags at length.  Follow up as needed.

## 2013-06-15 NOTE — Telephone Encounter (Signed)
This prescription is okay 

## 2013-06-16 ENCOUNTER — Ambulatory Visit: Payer: Non-veteran care | Admitting: Cardiology

## 2013-06-17 ENCOUNTER — Telehealth: Payer: Self-pay | Admitting: Family Medicine

## 2013-06-21 ENCOUNTER — Other Ambulatory Visit: Payer: Self-pay | Admitting: Family Medicine

## 2013-06-21 DIAGNOSIS — J309 Allergic rhinitis, unspecified: Secondary | ICD-10-CM

## 2013-06-21 MED ORDER — FLUTICASONE PROPIONATE 50 MCG/ACT NA SUSP: 2.0000 | Freq: Every day | NASAL | Status: DC

## 2013-07-01 DIAGNOSIS — E119 Type 2 diabetes mellitus without complications: Secondary | ICD-10-CM

## 2013-07-01 HISTORY — DX: Type 2 diabetes mellitus without complications: E11.9

## 2013-07-06 ENCOUNTER — Encounter: Payer: Self-pay | Admitting: Family Medicine

## 2013-07-06 ENCOUNTER — Telehealth: Payer: Self-pay | Admitting: Family Medicine

## 2013-07-06 ENCOUNTER — Ambulatory Visit (INDEPENDENT_AMBULATORY_CARE_PROVIDER_SITE_OTHER): Payer: Medicare Other | Admitting: Family Medicine

## 2013-07-06 VITALS — BP 124/81 | HR 72 | Temp 98.4°F | Ht 72.5 in | Wt 253.0 lb

## 2013-07-06 DIAGNOSIS — E8881 Metabolic syndrome: Secondary | ICD-10-CM

## 2013-07-06 DIAGNOSIS — I6529 Occlusion and stenosis of unspecified carotid artery: Secondary | ICD-10-CM

## 2013-07-06 DIAGNOSIS — E785 Hyperlipidemia, unspecified: Secondary | ICD-10-CM

## 2013-07-06 DIAGNOSIS — Z23 Encounter for immunization: Secondary | ICD-10-CM

## 2013-07-06 DIAGNOSIS — I10 Essential (primary) hypertension: Secondary | ICD-10-CM

## 2013-07-06 DIAGNOSIS — I2510 Atherosclerotic heart disease of native coronary artery without angina pectoris: Secondary | ICD-10-CM

## 2013-07-06 DIAGNOSIS — J309 Allergic rhinitis, unspecified: Secondary | ICD-10-CM

## 2013-07-06 LAB — POCT GLYCOSYLATED HEMOGLOBIN (HGB A1C): Hemoglobin A1C: 5.4

## 2013-07-06 MED ORDER — NITROGLYCERIN 0.4 MG SL SUBL: 0.4000 mg | SUBLINGUAL_TABLET | SUBLINGUAL | Status: DC | PRN

## 2013-07-06 MED ORDER — METOPROLOL TARTRATE 50 MG PO TABS: 25.0000 mg | ORAL_TABLET | Freq: Two times a day (BID) | ORAL | Status: DC

## 2013-07-06 MED ORDER — FLUTICASONE PROPIONATE 50 MCG/ACT NA SUSP: 2.0000 | Freq: Every day | NASAL | Status: DC

## 2013-07-06 NOTE — Progress Notes (Signed)
Subjective:    Patient ID: Cristian Moore, male    DOB: 12/17/1940, 73 y.o.   MRN: [id number]  HPI Pt here for follow up and management of chronic medical problems. See recent MRI of the LS spine: IMPRESSION:  1. No acute osseous abnormality in the lumbar spine. Stable  predominately mild for age lumbar disc, endplate, and facet  degeneration since 2013. Chronic more advanced disc degeneration at  L2-L3 with trace retrolisthesis appears stable.  2. Multilevel predominately mild lumbar foraminal stenosis appears  not significantly changed. No lumbar spinal stenosis or convincing  acute neural impingement.  Lab work from the [place] he was brought in and this will be reviewed with the patient during his visit. There was no hemoglobin A1c and there was no BMP done on this  lab work. Patient is trying to aggressively work on his diet and his caloric intake and try to get more exercise. [facility]. We also reviewed his records and do not believe he has had a shingle shot. He will receive Prevnar vaccine today. The lab work that he brought in from the [place] was reviewed with him today.       Patient Active Problem List   Diagnosis Date Noted  . Non-Obstructive CAD 03/05/2013  . Essential hypertension 02/22/2013  . Shortness of breath   . Obstructive sleep apnea   . Diverticulitis, recurrent, s/p lap sigmoid colectomy 07/23/2012 06/02/2012  . CAROTID ARTERY DISEASE 09/28/2009  . Obesity (BMI 30-39.9) 08/02/2009  . DYSLIPIDEMIA 08/01/2009   Outpatient Encounter Prescriptions as of 07/06/2013  Medication Sig  . aspirin 81 MG tablet Take 1 tablet (81 mg total) by mouth daily.  . cholecalciferol (VITAMIN D) 1000 UNITS tablet Take 2,000 Units by mouth daily.   . Coenzyme Q10 (CO Q-10) 100 MG CAPS Take 100 mg by mouth daily.  . fish oil-omega-3 fatty acids 1000 MG capsule Take 1 g by mouth 2 (two) times daily.   . fluticasone (FLONASE) 50 MCG/ACT nasal spray Place 2 sprays  into both nostrils daily.  . metoprolol (LOPRESSOR) 50 MG tablet Take 0.5 tablets (25 mg total) by mouth 2 (two) times daily.  . Multiple Vitamins-Minerals (CENTRUM SILVER PO) Take 1 tablet by mouth daily.  Marland Kitchen NITROSTAT 0.4 MG SL tablet Place 0.4 mg under the tongue every 5 (five) minutes as needed. Chest pains  . omeprazole (PRILOSEC) 20 MG capsule Take 1 capsule (20 mg total) by mouth daily. EVERY AM BEFORE BREAKFAST  . simvastatin (ZOCOR) 20 MG tablet Take 1 tablet (20 mg total) by mouth at bedtime.  . sodium chloride (OCEAN) 0.65 % nasal spray Place 1 spray into the nose as needed for congestion.  . sucralfate (CARAFATE) 1 G tablet Take 1 g by mouth 2 (two) times daily.  . tamsulosin (FLOMAX) 0.4 MG CAPS capsule Take 1 capsule (0.4 mg total) by mouth at bedtime.  Marland Kitchen zolpidem (AMBIEN) 10 MG tablet Take 1 tablet (10 mg total) by mouth at bedtime as needed for sleep.  . [DISCONTINUED] doxycycline (VIBRAMYCIN) 100 MG capsule Take 1 capsule (100 mg total) by mouth 2 (two) times daily.  . [DISCONTINUED] neomycin-polymyxin-hydrocortisone (CORTISPORIN) otic solution Place 4 drops into the right ear 4 (four) times daily.    Review of Systems  Constitutional: Negative.   HENT: Negative.   Eyes: Negative.   Respiratory: Negative.   Cardiovascular: Negative.   Gastrointestinal: Negative.   Endocrine: Negative.   Genitourinary: Negative.   Musculoskeletal:  Positive for back pain (see mri from 11/14).  Skin: Negative.   Allergic/Immunologic: Negative.   Neurological: Negative.   Hematological: Negative.   Psychiatric/Behavioral: Negative.        Objective:   Physical Exam  Nursing note and vitals reviewed. Constitutional: He is oriented to person, place, and time. He appears well-developed and well-nourished. No distress.  Overweight  HENT:  Head: Normocephalic and atraumatic.  Right Ear: External ear normal.  Left Ear: External ear normal.  Nose: Nose normal.  Mouth/Throat: Oropharynx  is clear and moist. No oropharyngeal exudate.  Eyes: Conjunctivae and EOM are normal. Pupils are equal, round, and reactive to light. Right eye exhibits no discharge. Left eye exhibits no discharge. No scleral icterus.  Status post blepharoplasty  Neck: Normal range of motion. Neck supple. No thyromegaly present.  Cardiovascular: Normal rate, regular rhythm, normal heart sounds and intact distal pulses.  Exam reveals no gallop and no friction rub.   No murmur heard.  At 60 per minute  Pulmonary/Chest: Effort normal and breath sounds normal. No respiratory distress. He has no wheezes. He has no rales. He exhibits no tenderness.  No axillary nodes  Abdominal: Soft. Bowel sounds are normal. He exhibits no mass. There is no tenderness. There is no rebound and no guarding.  Obese. No inguinal nodes  Genitourinary: Rectum normal and penis normal.  Prostate is enlarged right greater than left. No lumps or masses palpated. No rectal masses. External genitalia normal.  Musculoskeletal: Normal range of motion. He exhibits no edema and no tenderness.  Lymphadenopathy:    He has no cervical adenopathy.  Neurological: He is alert and oriented to person, place, and time. He has normal reflexes. No cranial nerve deficit.  Skin: Skin is warm and dry. No rash noted. No erythema. No pallor.  Psychiatric: He has a normal mood and affect. His behavior is normal. Judgment and thought content normal.   BP 124/81  Pulse 72  Temp(Src) 98.4 F (36.9 C) (Oral)  Ht 6' 0.5" (1.842 m)  Wt 253 lb (114.76 kg)  BMI 33.82 kg/m2        Assessment & Plan:  1. DYSLIPIDEMIA - Ambulatory referral to Cardiology  2. Essential hypertension - Ambulatory referral to Cardiology - BMP8+EGFR  3. Occlusion and stenosis of carotid artery without mention of cerebral infarction, unspecified laterality  4. Non-Obstructive CAD - Ambulatory referral to Cardiology  5. Allergic rhinitis - fluticasone (FLONASE) 50 MCG/ACT  nasal spray; Place 2 sprays into both nostrils daily.  Dispense: 48 g; Refill: 3  6. Metabolic syndrome - VPX1+GGYI - POCT glycosylated hemoglobin (Hb A1C) Patient Instructions  Continue current medications. Continue good therapeutic lifestyle changes which include good diet and exercise. Fall precautions discussed with patient. Schedule your flu vaccine if you haven't had it yet If you are over 45 years old - you may need Prevnar 42 or the adult Pneumonia vaccine. Continue walking, walk on a regular surface. Continue to watch  diet closely, the weight will come off if you'll persist at good therapeutic lifestyle changes You will receive the Prevnar vaccine today and this may make her arm somewhat sore. [facility] And if they cannot give the shingle shot we will do that   Arrie Senate MD

## 2013-07-06 NOTE — Patient Instructions (Addendum)
Continue current medications. Continue good therapeutic lifestyle changes which include good diet and exercise. Fall precautions discussed with patient. Schedule your flu vaccine if you haven't had it yet If you are over 74 years old - you may need Prevnar 52 or the adult Pneumonia vaccine. Continue walking, walk on a regular surface. Continue to watch  diet closely, the weight will come off if you'll persist at good therapeutic lifestyle changes You will receive the Prevnar vaccine today and this may make her arm somewhat sore. [facility] And if they cannot give the shingle shot we will do that

## 2013-07-07 LAB — BMP8+EGFR
BUN / CREAT RATIO: 16 (ref 10–22)
BUN: 19 mg/dL (ref 8–27)
CHLORIDE: 101 mmol/L (ref 97–108)
CO2: 25 mmol/L (ref 18–29)
Calcium: 9.9 mg/dL (ref 8.6–10.2)
Creatinine, Ser: 1.2 mg/dL (ref 0.76–1.27)
GFR, EST AFRICAN AMERICAN: 69 mL/min/{1.73_m2} (ref >59)
GFR, EST NON AFRICAN AMERICAN: 60 mL/min/{1.73_m2} (ref >59)
Glucose: 109 mg/dL — ABNORMAL HIGH (ref 65–99)
POTASSIUM: 4.7 mmol/L (ref 3.5–5.2)
SODIUM: 142 mmol/L (ref 134–144)

## 2013-07-07 NOTE — Telephone Encounter (Signed)
Patient aware.

## 2013-07-08 ENCOUNTER — Other Ambulatory Visit: Payer: Medicare Other

## 2013-07-08 NOTE — Addendum Note (Signed)
Addended by: Zannie Cove on: 07/08/2013 04:07 PM   Modules accepted: Orders

## 2013-07-08 NOTE — Progress Notes (Signed)
Pt came in for labs only 

## 2013-07-11 LAB — FECAL OCCULT BLOOD, IMMUNOCHEMICAL: Fecal Occult Bld: NEGATIVE

## 2013-07-21 ENCOUNTER — Telehealth: Payer: Self-pay | Admitting: Family Medicine

## 2013-07-27 DIAGNOSIS — Z9889 Other specified postprocedural states: Secondary | ICD-10-CM

## 2013-07-27 HISTORY — DX: Other specified postprocedural states: Z98.890

## 2013-07-29 ENCOUNTER — Encounter: Payer: Self-pay | Admitting: Cardiology

## 2013-07-29 ENCOUNTER — Ambulatory Visit (HOSPITAL_COMMUNITY): Payer: Medicare Other | Attending: Physician Assistant

## 2013-07-29 DIAGNOSIS — Z87891 Personal history of nicotine dependence: Secondary | ICD-10-CM | POA: Insufficient documentation

## 2013-07-29 DIAGNOSIS — I10 Essential (primary) hypertension: Secondary | ICD-10-CM | POA: Insufficient documentation

## 2013-07-29 DIAGNOSIS — I658 Occlusion and stenosis of other precerebral arteries: Secondary | ICD-10-CM | POA: Insufficient documentation

## 2013-07-29 DIAGNOSIS — I6529 Occlusion and stenosis of unspecified carotid artery: Secondary | ICD-10-CM | POA: Insufficient documentation

## 2013-07-29 DIAGNOSIS — I2510 Atherosclerotic heart disease of native coronary artery without angina pectoris: Secondary | ICD-10-CM | POA: Insufficient documentation

## 2013-07-29 DIAGNOSIS — E785 Hyperlipidemia, unspecified: Secondary | ICD-10-CM | POA: Insufficient documentation

## 2013-08-02 ENCOUNTER — Telehealth: Payer: Self-pay | Admitting: *Deleted

## 2013-08-02 ENCOUNTER — Encounter: Payer: Self-pay | Admitting: Physician Assistant

## 2013-08-02 NOTE — Telephone Encounter (Signed)
pt notified about carotid results with verabla understanding, veified Dr. Percival Spanish appt 2/4 in [place]

## 2013-08-04 ENCOUNTER — Encounter: Payer: Self-pay | Admitting: Cardiology

## 2013-08-04 ENCOUNTER — Ambulatory Visit (INDEPENDENT_AMBULATORY_CARE_PROVIDER_SITE_OTHER): Payer: Medicare Other | Admitting: Cardiology

## 2013-08-04 VITALS — BP 114/70 | HR 67 | Ht 72.5 in | Wt 257.0 lb

## 2013-08-04 DIAGNOSIS — I10 Essential (primary) hypertension: Secondary | ICD-10-CM

## 2013-08-04 DIAGNOSIS — E669 Obesity, unspecified: Secondary | ICD-10-CM

## 2013-08-04 DIAGNOSIS — R0602 Shortness of breath: Secondary | ICD-10-CM

## 2013-08-04 NOTE — Progress Notes (Signed)
HPI The patient returns for follow up of HTN and chest pain.  He had mild nonobstructive plaque on catheterization in August of last year.  He has continued to have dyspnea but had a pulmonary consult without any evidence for fixed pulmonary disease.  The patient denies any new symptoms such as chest discomfort, neck or arm discomfort. There has been no new shortness of breath, PND or orthopnea although he still has DOE.Marland Kitchen There have been no reported palpitations, presyncope or syncope.   Allergies  Allergen Reactions  . Penicillins Rash    Over 60 years ago    Current Outpatient Prescriptions  Medication Sig Dispense Refill  . aspirin 81 MG tablet Take 1 tablet (81 mg total) by mouth daily.      . cholecalciferol (VITAMIN D) 1000 UNITS tablet Take 2,000 Units by mouth daily.       . Coenzyme Q10 (CO Q-10) 100 MG CAPS Take 100 mg by mouth daily.      . fish oil-omega-3 fatty acids 1000 MG capsule Take 1 g by mouth 2 (two) times daily.       . metoprolol (LOPRESSOR) 50 MG tablet Take 0.5 tablets (25 mg total) by mouth 2 (two) times daily.  90 tablet  3  . Multiple Vitamins-Minerals (CENTRUM SILVER PO) Take 1 tablet by mouth daily.      . nitroGLYCERIN (NITROSTAT) 0.4 MG SL tablet Place 1 tablet (0.4 mg total) under the tongue every 5 (five) minutes as needed. Chest pains  25 tablet  6  . omeprazole (PRILOSEC) 20 MG capsule Take 1 capsule (20 mg total) by mouth daily. EVERY AM BEFORE BREAKFAST      . simvastatin (ZOCOR) 20 MG tablet Take 1 tablet (20 mg total) by mouth at bedtime.  90 tablet  3  . sucralfate (CARAFATE) 1 G tablet Take 1 g by mouth 2 (two) times daily.      . tamsulosin (FLOMAX) 0.4 MG CAPS capsule Take 1 capsule (0.4 mg total) by mouth at bedtime.  90 capsule  3  . zolpidem (AMBIEN) 10 MG tablet Take 1 tablet (10 mg total) by mouth at bedtime as needed for sleep.  90 tablet  0  . fluticasone (FLONASE) 50 MCG/ACT nasal spray Place 2 sprays into both nostrils daily.  48 g  3   . sodium chloride (OCEAN) 0.65 % nasal spray Place 1 spray into the nose as needed for congestion.       No current facility-administered medications for this visit.    Past Medical History  Diagnosis Date  . Hyperlipidemia   . Musculoskeletal pain     in the right shoulder - S/P ROTATOR CUFF REPAIR-BUT STILL HAS PAIN  . Laceration  01/11/2009     Deep laceration left index finger dorsally  . Diverticulosis   . Carotid stenosis     Carotid US (07/2013):  Bilateral 1-39% ICA (f/u 2 yrs)  . Exogenous obesity   . Thyroiditis   . Diverticulitis   . Benign prostatic hypertrophy   . Arthritis   . GERD (gastroesophageal reflux disease)   . Rectal bleeding     PT ATTRIBUTES TO HEMORRHOIDS  . Difficulty urinating   . Obstructive sleep apnea      CPAP    AUTO SET 6 TO 20 - USUALLY SETTLES OUT AT 10  . Hernia     Umbulical   . Ulcer 5956  . H/O hiatal hernia   . SUPRAVENTRICULAR TACHYCARDIA 08/01/2009  Qualifier: Diagnosis of  By: Lovette Cliche, CNA, Alyse Low    . Chest pain     a. 01/2013 Cath: LM nl, LAD 25p, D1 25p, LCX nl, RCA 25p, PDA nl, RPL nl, EF 55%.    Past Surgical History  Procedure Laterality Date  . Inguinal hernia repair    . Kidney stone surgery    . Tonsillectomy    . Colonoscopy      for polyps  . Rotator cuff repair    . Knee surgery    . Elbow bursa surgery    . Laparoscopic sigmoid colectomy  07/23/2012    Procedure: LAPAROSCOPIC SIGMOID COLECTOMY;  Surgeon: [name], MD;  Location: [facility];  Service: General;  Laterality: N/A;  Laparoscopic Sigmoid Colectomy  . Umbilical hernia repair  07/23/2012    Procedure: HERNIA REPAIR UMBILICAL ADULT;  Surgeon: [name], MD;  Location: [facility];  Service: General;  Laterality: N/A;  Primary Umbilical Hernia Repair  . Proctoscopy  07/23/2012    Procedure: PROCTOSCOPY;  Surgeon: [name], MD;  Location: [facility];  Service: General;  Laterality: N/A;  Rigid Proctoscopy  . Eyelid surgery Bilateral 05/2013    ROS:   As stated in the HPI and negative for all other systems.  PHYSICAL EXAM BP 114/70  Pulse 67  Ht 6' 0.5" (1.842 m)  Wt 257 lb (116.574 kg)  BMI 34.36 kg/m2 GENERAL:  Well appearing NECK:  No jugular venous distention, waveform within normal limits, carotid upstroke brisk and symmetric, no bruits, no thyromegaly LUNGS:  Clear to auscultation bilaterally BACK:  No CVA tenderness CHEST:  Unremarkable HEART:  PMI not displaced or sustained,S1 and S2 within normal limits, no S3, no S4, no clicks, no rubs, no murmurs ABD:  Flat, positive bowel sounds normal in frequency in pitch, no bruits, no rebound, no guarding, no midline pulsatile mass, no hepatomegaly, no splenomegaly, obese EXT:  2 plus pulses throughout, no edema, no cyanosis no clubbing    ASSESSMENT AND PLAN   DYSPNEA-  He has come to the conclusion that this is likely related to weight and deconditioning.  I agree.  No change in therapy or further evaluation is indicated.  We discussed weight loss and exercise strategies.  He is starting a formal program.    SUPRAVENTRICULAR TACHYCARDIA -  He has had no further dysrhythmias. No change in therapy is indicated.   Carotid stenosis - He has mild carotid plaque last month and this has been stable. We will follow this up again in a few years.

## 2013-08-04 NOTE — Patient Instructions (Signed)
The current medical regimen is effective;  continue present plan and medications.  Follow up as needed 

## 2013-08-19 ENCOUNTER — Telehealth: Payer: Self-pay | Admitting: Family Medicine

## 2013-08-19 NOTE — Telephone Encounter (Signed)
Please a referral to [facility], Dr. Nelva Bush

## 2013-08-23 ENCOUNTER — Telehealth: Payer: Self-pay | Admitting: Family Medicine

## 2013-08-23 DIAGNOSIS — M549 Dorsalgia, unspecified: Secondary | ICD-10-CM

## 2013-08-23 NOTE — Telephone Encounter (Signed)
I would wait until he sees the specialist unless that is too far out in the future

## 2013-08-24 NOTE — Telephone Encounter (Signed)
He wants you to send in a referral to a specialist for him please. To someone you think is good for his back

## 2013-08-24 NOTE — Telephone Encounter (Signed)
Please do a referral to Dr. Nelva Bush

## 2013-10-08 ENCOUNTER — Telehealth: Payer: Self-pay | Admitting: Family Medicine

## 2013-10-08 ENCOUNTER — Ambulatory Visit (INDEPENDENT_AMBULATORY_CARE_PROVIDER_SITE_OTHER): Payer: Medicare Other | Admitting: Family Medicine

## 2013-10-08 ENCOUNTER — Encounter: Payer: Self-pay | Admitting: Family Medicine

## 2013-10-08 VITALS — BP 128/76 | HR 64 | Temp 99.5°F | Ht 72.5 in | Wt 261.0 lb

## 2013-10-08 DIAGNOSIS — J029 Acute pharyngitis, unspecified: Secondary | ICD-10-CM

## 2013-10-08 DIAGNOSIS — R52 Pain, unspecified: Secondary | ICD-10-CM

## 2013-10-08 DIAGNOSIS — J069 Acute upper respiratory infection, unspecified: Secondary | ICD-10-CM

## 2013-10-08 DIAGNOSIS — R509 Fever, unspecified: Secondary | ICD-10-CM

## 2013-10-08 LAB — POCT INFLUENZA A/B
Influenza A, POC: NEGATIVE
Influenza B, POC: NEGATIVE

## 2013-10-08 LAB — POCT RAPID STREP A (OFFICE): Rapid Strep A Screen: POSITIVE — AB

## 2013-10-08 MED ORDER — METHYLPREDNISOLONE ACETATE 80 MG/ML IJ SUSP
80.0000 mg | Freq: Once | INTRAMUSCULAR | Status: AC
  Administered 2013-10-08: 80 mg via INTRAMUSCULAR

## 2013-10-08 MED ORDER — AZITHROMYCIN 250 MG PO TABS: ORAL_TABLET | ORAL | Status: DC

## 2013-10-08 NOTE — Progress Notes (Signed)
   Subjective:    Patient ID: KHYLAN SAWYER, male    DOB: 1940-12-03, 73 y.o.   MRN: [id number]  HPI  This 73 y.o. male presents for evaluation of uri sx's and cough. He has been having yellow productive cough and especially at night.  He has been feeling washed out.  Review of Systems    No chest pain, SOB, HA, dizziness, vision change, N/V, diarrhea, constipation, dysuria, urinary urgency or frequency, myalgias, arthralgias or rash.  Objective:   Physical Exam  Vital signs noted  Well developed well nourished male.  HEENT - Head atraumatic Normocephalic                Eyes - PERRLA, Conjuctiva - clear Sclera- Clear EOMI                Ears - EAC's Wnl TM's Wnl Gross Hearing WNL                Nose - Nares patent                 Throat - oropharanx wnl Respiratory - Lungs CTA bilateral Cardiac - RRR S1 and S2 without murmur GI - Abdomen soft Nontender and bowel sounds active x 4 Extremities - No edema. Neuro - Grossly intact.  Results for orders placed in visit on 10/08/13  POCT INFLUENZA A/B      Result Value Ref Range   Influenza A, POC Negative     Influenza B, POC Negative    POCT RAPID STREP A (OFFICE)      Result Value Ref Range   Rapid Strep A Screen Positive (*) Negative      Assessment & Plan:  Sore throat - Plan: POCT Influenza A/B, POCT rapid strep A, azithromycin (ZITHROMAX) 250 MG tablet, methylPREDNISolone acetate (DEPO-MEDROL) injection 80 mg  Body aches - Plan: POCT Influenza A/B, POCT rapid strep A, azithromycin (ZITHROMAX) 250 MG tablet, methylPREDNISolone acetate (DEPO-MEDROL) injection 80 mg  Fever - Plan: POCT Influenza A/B, POCT rapid strep A, azithromycin (ZITHROMAX) 250 MG tablet, methylPREDNISolone acetate (DEPO-MEDROL) injection 80 mg  URI (upper respiratory infection) - Plan: azithromycin (ZITHROMAX) 250 MG tablet, methylPREDNISolone acetate (DEPO-MEDROL) injection 80 mg  Push po fluids, rest, tylenol and motrin otc prn as directed for  fever, arthralgias, and myalgias.  Follow up prn if sx's continue or persist.  Lysbeth Penner FNP

## 2013-10-08 NOTE — Telephone Encounter (Signed)
Appt scheduled. Patient aware. 

## 2013-10-28 ENCOUNTER — Ambulatory Visit (INDEPENDENT_AMBULATORY_CARE_PROVIDER_SITE_OTHER): Payer: Medicare Other | Admitting: Family Medicine

## 2013-10-28 ENCOUNTER — Encounter: Payer: Self-pay | Admitting: Family Medicine

## 2013-10-28 VITALS — BP 101/67 | HR 67 | Temp 98.1°F | Ht 72.5 in | Wt 253.0 lb

## 2013-10-28 DIAGNOSIS — I10 Essential (primary) hypertension: Secondary | ICD-10-CM

## 2013-10-28 DIAGNOSIS — E8881 Metabolic syndrome: Secondary | ICD-10-CM

## 2013-10-28 DIAGNOSIS — E559 Vitamin D deficiency, unspecified: Secondary | ICD-10-CM

## 2013-10-28 DIAGNOSIS — N400 Enlarged prostate without lower urinary tract symptoms: Secondary | ICD-10-CM

## 2013-10-28 DIAGNOSIS — I6529 Occlusion and stenosis of unspecified carotid artery: Secondary | ICD-10-CM

## 2013-10-28 DIAGNOSIS — E785 Hyperlipidemia, unspecified: Secondary | ICD-10-CM

## 2013-10-28 LAB — POCT URINALYSIS DIPSTICK
Bilirubin, UA: NEGATIVE
Blood, UA: NEGATIVE
GLUCOSE UA: NEGATIVE
KETONES UA: NEGATIVE
LEUKOCYTES UA: NEGATIVE
Nitrite, UA: NEGATIVE
PROTEIN UA: NEGATIVE
UROBILINOGEN UA: NEGATIVE
pH, UA: 7.5

## 2013-10-28 LAB — POCT UA - MICROSCOPIC ONLY
BACTERIA, U MICROSCOPIC: NEGATIVE
CRYSTALS, UR, HPF, POC: NEGATIVE
Casts, Ur, LPF, POC: NEGATIVE
Mucus, UA: NEGATIVE
RBC, URINE, MICROSCOPIC: NEGATIVE
WBC, Ur, HPF, POC: NEGATIVE
Yeast, UA: NEGATIVE

## 2013-10-28 LAB — POCT GLYCOSYLATED HEMOGLOBIN (HGB A1C)

## 2013-10-28 MED ORDER — SUCRALFATE 1 G PO TABS: 1.0000 g | ORAL_TABLET | Freq: Two times a day (BID) | ORAL | Status: DC

## 2013-10-28 NOTE — Progress Notes (Signed)
Subjective:    Patient ID: Cristian Moore, male    DOB: 1940/08/25, 73 y.o.   MRN: [id number]  HPI Pt here for follow up and management of chronic medical problems. The patient is to get a PSA and prostate exam today. He will also get lab work today. He is up-to-date on his immunizations. He just finished a course of prednisone for his back. It was recommended are suggested by the [employer] that he can stop his cholesterol medication.         Patient Active Problem List   Diagnosis Date Noted  . Non-Obstructive CAD 03/05/2013  . Essential hypertension 02/22/2013  . Shortness of breath   . Obstructive sleep apnea   . Diverticulitis, recurrent, s/p lap sigmoid colectomy 07/23/2012 06/02/2012  . CAROTID ARTERY DISEASE 09/28/2009  . Obesity (BMI 30-39.9) 08/02/2009  . DYSLIPIDEMIA 08/01/2009   Outpatient Encounter Prescriptions as of 10/28/2013  Medication Sig  . aspirin 81 MG tablet Take 1 tablet (81 mg total) by mouth daily.  . cholecalciferol (VITAMIN D) 1000 UNITS tablet Take 2,000 Units by mouth daily.   . Coenzyme Q10 (CO Q-10) 100 MG CAPS Take 100 mg by mouth daily.  . fish oil-omega-3 fatty acids 1000 MG capsule Take 1 g by mouth 2 (two) times daily.   . fluticasone (FLONASE) 50 MCG/ACT nasal spray Place 2 sprays into both nostrils daily.  . metoprolol (LOPRESSOR) 50 MG tablet Take 0.5 tablets (25 mg total) by mouth 2 (two) times daily.  . Multiple Vitamins-Minerals (CENTRUM SILVER PO) Take 1 tablet by mouth daily.  . nitroGLYCERIN (NITROSTAT) 0.4 MG SL tablet Place 1 tablet (0.4 mg total) under the tongue every 5 (five) minutes as needed. Chest pains  . omeprazole (PRILOSEC) 20 MG capsule Take 1 capsule (20 mg total) by mouth daily. EVERY AM BEFORE BREAKFAST  . simvastatin (ZOCOR) 20 MG tablet Take 1 tablet (20 mg total) by mouth at bedtime.  . sodium chloride (OCEAN) 0.65 % nasal spray Place 1 spray into the nose as needed for congestion.  . sucralfate  (CARAFATE) 1 G tablet Take 1 g by mouth 2 (two) times daily.  . tamsulosin (FLOMAX) 0.4 MG CAPS capsule Take 1 capsule (0.4 mg total) by mouth at bedtime.  Marland Kitchen zolpidem (AMBIEN) 10 MG tablet Take 1 tablet (10 mg total) by mouth at bedtime as needed for sleep.  . [DISCONTINUED] azithromycin (ZITHROMAX) 250 MG tablet Take 2 po first day and then one po qd x 4 days    Review of Systems  Constitutional: Negative.   HENT: Negative.   Eyes: Negative.   Respiratory: Negative.   Cardiovascular: Negative.   Gastrointestinal: Negative.   Endocrine: Negative.   Genitourinary: Negative.   Musculoskeletal: Positive for back pain (seen Ramos and now may do PT).  Skin: Negative.   Allergic/Immunologic: Negative.   Neurological: Negative.   Hematological: Negative.   Psychiatric/Behavioral: Negative.        Objective:   Physical Exam  Nursing note and vitals reviewed. Constitutional: He is oriented to person, place, and time. He appears well-developed and well-nourished. No distress.  Pleasant, overweight and eager to move on with his physical therapy. His recent back pain treated by the orthopedic surgeon has improved after a round of prednisone  HENT:  Head: Normocephalic and atraumatic.  Right Ear: External ear normal.  Left Ear: External ear normal.  Nose: Nose normal.  Mouth/Throat: Oropharynx is clear and moist. No oropharyngeal exudate.  Eyes: Conjunctivae and EOM  are normal. Pupils are equal, round, and reactive to light. Right eye exhibits no discharge. Left eye exhibits no discharge. No scleral icterus.  Neck: Normal range of motion. Neck supple. No thyromegaly present.  No carotid bruits  Cardiovascular: Normal rate, regular rhythm, normal heart sounds and intact distal pulses.  Exam reveals no gallop and no friction rub.   No murmur heard. The rhythm is regular at 60 per minute  Pulmonary/Chest: Effort normal and breath sounds normal. No respiratory distress. He has no wheezes. He  has no rales. He exhibits no tenderness.  Abdominal: Soft. Bowel sounds are normal. He exhibits no mass. There is no tenderness. There is no rebound and no guarding.  Minimal epigastric tenderness  Genitourinary: Rectum normal and penis normal.  Minimal if any prostate enlargement. No inguinal hernias. No inguinal nodes. External genitalia within normal limits. No rectal masses.  Musculoskeletal: Normal range of motion. He exhibits no edema and no tenderness.  Lymphadenopathy:    He has no cervical adenopathy.  Neurological: He is alert and oriented to person, place, and time. He has normal reflexes. No cranial nerve deficit.  Skin: Skin is warm and dry. No rash noted. No erythema. No pallor.  Psychiatric: He has a normal mood and affect. His behavior is normal. Judgment and thought content normal.   BP 101/67  Pulse 67  Temp(Src) 98.1 F (36.7 C) (Oral)  Ht 6' 0.5" (1.842 m)  Wt 253 lb (114.76 kg)  BMI 33.82 kg/m2        Assessment & Plan:  1. CAROTID ARTERY DISEASE - POCT CBC  2. DYSLIPIDEMIA - POCT CBC - NMR, lipoprofile  3. Essential hypertension - POCT CBC - BMP8+EGFR - Hepatic function panel  4. Metabolic syndrome - POCT glycosylated hemoglobin (Hb A1C) - POCT CBC  5. BPH (benign prostatic hyperplasia) - POCT UA - Microscopic Only - POCT urinalysis dipstick - PSA, total and free  6. Vitamin D deficiency - Vit D  25 hydroxy (rtn osteoporosis monitoring)  Meds ordered this encounter  Medications  . sucralfate (CARAFATE) 1 G tablet    Sig: Take 1 tablet (1 g total) by mouth 2 (two) times daily.    Dispense:  180 tablet    Refill:  3   Patient Instructions                       Medicare Annual Wellness Visit  [facility] and the medical providers at [facility] strive to bring you the best medical care.  In doing so we not only want to address your current medical conditions and concerns but also to detect new conditions early and  prevent illness, disease and health-related problems.    Medicare offers a yearly Wellness Visit which allows our clinical staff to assess your need for preventative services including immunizations, lifestyle education, counseling to decrease risk of preventable diseases and screening for fall risk and other medical concerns.    This visit is provided free of charge (no copay) for all Medicare recipients. The clinical pharmacists at [facility] have begun to conduct these Wellness Visits which will also include a thorough review of all your medications.    As you primary medical provider recommend that you make an appointment for your Annual Wellness Visit if you have not done so already this year.  You may set up this appointment before you leave today or you may call back ([phone]) and schedule an appointment.  Please make sure when you call that you mention that you are scheduling your Annual Wellness Visit with the clinical pharmacist so that the appointment may be made for the proper length of time.       Continue current medications. Continue good therapeutic lifestyle changes which include good diet and exercise. Fall precautions discussed with patient. If an FOBT was given today- please return it to our front desk. If you are over 18 years old - you may need Prevnar 23 or the adult Pneumonia vaccine.  Continue to exercise and walk regularly Follow through with physical therapy as ordered by the orthopedic surgeon For now, continue the simvastatin We will call you with the results of her lab work once those results are available   Arrie Senate MD

## 2013-10-28 NOTE — Addendum Note (Signed)
Addended by: Pollyann Kennedy F on: 10/28/2013 12:43 PM   Modules accepted: Orders

## 2013-10-28 NOTE — Patient Instructions (Addendum)
Medicare Annual Wellness Visit  [facility] and the medical providers at [facility] strive to bring you the best medical care.  In doing so we not only want to address your current medical conditions and concerns but also to detect new conditions early and prevent illness, disease and health-related problems.    Medicare offers a yearly Wellness Visit which allows our clinical staff to assess your need for preventative services including immunizations, lifestyle education, counseling to decrease risk of preventable diseases and screening for fall risk and other medical concerns.    This visit is provided free of charge (no copay) for all Medicare recipients. The clinical pharmacists at [facility] have begun to conduct these Wellness Visits which will also include a thorough review of all your medications.    As you primary medical provider recommend that you make an appointment for your Annual Wellness Visit if you have not done so already this year.  You may set up this appointment before you leave today or you may call back ([phone]) and schedule an appointment.  Please make sure when you call that you mention that you are scheduling your Annual Wellness Visit with the clinical pharmacist so that the appointment may be made for the proper length of time.       Continue current medications. Continue good therapeutic lifestyle changes which include good diet and exercise. Fall precautions discussed with patient. If an FOBT was given today- please return it to our front desk. If you are over 22 years old - you may need Prevnar 99 or the adult Pneumonia vaccine.  Continue to exercise and walk regularly Follow through with physical therapy as ordered by the orthopedic surgeon For now, continue the simvastatin We will call you with the results of her lab work once those results are available

## 2013-10-29 LAB — NMR, LIPOPROFILE
CHOLESTEROL: 180 mg/dL (ref <200)
HDL Cholesterol by NMR: 53 mg/dL (ref >=40)
HDL PARTICLE NUMBER: 41.5 umol/L (ref >=30.5)
LDL PARTICLE NUMBER: 1132 nmol/L — AB (ref <1000)
LDL Size: 20.8 nm (ref >20.5)
LDLC SERPL CALC-MCNC: 89 mg/dL (ref <100)
LP-IR Score: 55 — ABNORMAL HIGH (ref <=45)
Small LDL Particle Number: 418 nmol/L (ref <=527)
TRIGLYCERIDES BY NMR: 191 mg/dL — AB (ref <150)

## 2013-10-29 LAB — CBC WITH DIFFERENTIAL
BASOS: 1 %
Basophils Absolute: 0 10*3/uL (ref 0.0–0.2)
EOS ABS: 0.1 10*3/uL (ref 0.0–0.4)
EOS: 2 %
HEMATOCRIT: 46.5 % (ref 37.5–51.0)
HEMOGLOBIN: 15.8 g/dL (ref 12.6–17.7)
IMMATURE GRANS (ABS): 0.1 10*3/uL (ref 0.0–0.1)
Immature Granulocytes: 2 %
LYMPHS: 22 %
Lymphocytes Absolute: 1.9 10*3/uL (ref 0.7–3.1)
MCH: 32.2 pg (ref 26.6–33.0)
MCHC: 34 g/dL (ref 31.5–35.7)
MCV: 95 fL (ref 79–97)
MONOCYTES: 12 %
Monocytes Absolute: 1 10*3/uL — ABNORMAL HIGH (ref 0.1–0.9)
NEUTROS ABS: 5.5 10*3/uL (ref 1.4–7.0)
NEUTROS PCT: 61 %
Platelets: 280 10*3/uL (ref 150–379)
RBC: 4.91 x10E6/uL (ref 4.14–5.80)
RDW: 13.4 % (ref 12.3–15.4)
WBC: 8.7 10*3/uL (ref 3.4–10.8)

## 2013-10-29 LAB — PSA, TOTAL AND FREE
PSA FREE PCT: 34.6 %
PSA, Free: 0.45 ng/mL
PSA: 1.3 ng/mL (ref 0.0–4.0)

## 2013-10-29 LAB — BMP8+EGFR
BUN/Creatinine Ratio: 27 — ABNORMAL HIGH (ref 10–22)
BUN: 29 mg/dL — AB (ref 8–27)
CALCIUM: 9.8 mg/dL (ref 8.6–10.2)
CHLORIDE: 100 mmol/L (ref 97–108)
CO2: 27 mmol/L (ref 18–29)
Creatinine, Ser: 1.08 mg/dL (ref 0.76–1.27)
GFR, EST AFRICAN AMERICAN: 78 mL/min/{1.73_m2} (ref >59)
GFR, EST NON AFRICAN AMERICAN: 68 mL/min/{1.73_m2} (ref >59)
GLUCOSE: 108 mg/dL — AB (ref 65–99)
POTASSIUM: 5.1 mmol/L (ref 3.5–5.2)
Sodium: 140 mmol/L (ref 134–144)

## 2013-10-29 LAB — HEPATIC FUNCTION PANEL
ALBUMIN: 4.3 g/dL (ref 3.5–4.8)
ALT: 16 IU/L (ref 0–44)
AST: 18 IU/L (ref 0–40)
Alkaline Phosphatase: 52 IU/L (ref 39–117)
Bilirubin, Direct: 0.11 mg/dL (ref 0.00–0.40)
TOTAL PROTEIN: 6.7 g/dL (ref 6.0–8.5)
Total Bilirubin: 0.3 mg/dL (ref 0.0–1.2)

## 2013-10-29 LAB — VITAMIN D 25 HYDROXY (VIT D DEFICIENCY, FRACTURES): VIT D 25 HYDROXY: 31.8 ng/mL (ref 30.0–100.0)

## 2013-11-03 ENCOUNTER — Other Ambulatory Visit: Payer: Self-pay | Admitting: Family Medicine

## 2013-11-05 ENCOUNTER — Other Ambulatory Visit: Payer: Self-pay

## 2013-11-05 MED ORDER — METOPROLOL TARTRATE 50 MG PO TABS: 25.0000 mg | ORAL_TABLET | Freq: Two times a day (BID) | ORAL | Status: DC

## 2013-11-05 NOTE — Telephone Encounter (Signed)
Last seen 10/28/13  DWM  If approved route to nurse to call into [facility]

## 2013-11-05 NOTE — Telephone Encounter (Signed)
This is okay to refill 

## 2013-11-05 NOTE — Telephone Encounter (Signed)
rx called into pharmacy

## 2013-11-08 ENCOUNTER — Ambulatory Visit (INDEPENDENT_AMBULATORY_CARE_PROVIDER_SITE_OTHER): Payer: Medicare Other | Admitting: Pharmacist

## 2013-11-08 ENCOUNTER — Encounter: Payer: Self-pay | Admitting: Pharmacist

## 2013-11-08 VITALS — BP 110/70 | HR 68 | Ht 72.5 in | Wt 252.0 lb

## 2013-11-08 DIAGNOSIS — E669 Obesity, unspecified: Secondary | ICD-10-CM

## 2013-11-08 DIAGNOSIS — E118 Type 2 diabetes mellitus with unspecified complications: Secondary | ICD-10-CM | POA: Insufficient documentation

## 2013-11-08 DIAGNOSIS — E119 Type 2 diabetes mellitus without complications: Secondary | ICD-10-CM

## 2013-11-08 MED ORDER — METFORMIN HCL ER 500 MG PO TB24: 500.0000 mg | ORAL_TABLET | Freq: Every day | ORAL | Status: DC

## 2013-11-08 NOTE — Progress Notes (Signed)
Subjective:    Cristian Moore is a 73 y.o. male who presents for an initial evaluation of Type 2 diabetes mellitus.  Current symptoms/problems include hyperglycemia. He recently took prednisone last week but recent A1c was 7.3% indicating uncontrolled DM prior to prednisone therapy.  The patient was recently diagnosed with Type 2 diabetes mellitus in April 2015 based on A1c of 7.3%.  Known diabetic complications: none Cardiovascular risk factors: advanced age (older than 47 for men, 51 for women), diabetes mellitus, dyslipidemia, hypertension, male gender and obesity (BMI >= 30 kg/m2) Current diabetic medications include none.   Current diet: not following any particular diet, diet somewhat high in refined sugar and CHO's Current exercise: none  Current monitoring regimen: none   Objective:    BP 110/70  Pulse 68  Ht 6' 0.5" (1.842 m)  Wt 252 lb (114.306 kg)  BMI 33.69 kg/m2  Lab Review Glucose (mg/dL)  Date Value  10/28/2013 108*  07/06/2013 109*     Glucose, Bld (mg/dL)  Date Value  02/21/2013 136*  02/20/2013 101*  07/24/2012 139*     CO2 (mmol/L)  Date Value  10/28/2013 27   07/06/2013 25   02/21/2013 24      BUN (mg/dL)  Date Value  10/28/2013 29*  07/06/2013 19   02/21/2013 16   02/20/2013 16   07/24/2012 10      Creatinine, Ser (mg/dL)  Date Value  10/28/2013 1.08   07/06/2013 1.20   02/21/2013 0.94     Assessment:    Diabetes Mellitus type II, newly diagnosed.    Plan:    1.  Rx changes: start metformin XR 500mg  1 tablet daily 2.  Education: Reviewed 'ABCs' of diabetes management (respective goals in parentheses):  A1C (<7), blood pressure (<130/80), and cholesterol (LDL <100).  Patient education about pathophysiology of type 2DM and discussed ways to reduce complications associated with uncontrolled type 2DM. 3.  Compliance at present is estimated to be inadequate. Efforts to improve compliance (if necessary) will be directed at dietary modifications:  decrease CHO intake (50 grams per meal and 20 grams per snack) increase intake of non starchy vegetables and increased exercise 4. Follow up: 4 weeks   Total time spent with patient = 45 minutes Cherre Robins, PharmD, CPP

## 2013-12-09 ENCOUNTER — Encounter: Payer: Self-pay | Admitting: Pharmacist

## 2013-12-09 ENCOUNTER — Ambulatory Visit (INDEPENDENT_AMBULATORY_CARE_PROVIDER_SITE_OTHER): Payer: Medicare Other | Admitting: Pharmacist

## 2013-12-09 VITALS — BP 121/70 | HR 72 | Ht 72.5 in | Wt 252.0 lb

## 2013-12-09 DIAGNOSIS — E119 Type 2 diabetes mellitus without complications: Secondary | ICD-10-CM

## 2013-12-09 DIAGNOSIS — E669 Obesity, unspecified: Secondary | ICD-10-CM

## 2013-12-09 MED ORDER — METFORMIN HCL ER 500 MG PO TB24: 500.0000 mg | ORAL_TABLET | Freq: Every day | ORAL | Status: DC

## 2013-12-09 NOTE — Progress Notes (Signed)
Subjective:    LENDON GEORGE is a 73 y.o. male who presents for follow up of Type 2 diabetes mellitus.   The patient was recently diagnosed with Type 2 diabetes mellitus in April 2015 based on A1c of 7.3%.  Known diabetic complications: none Cardiovascular risk factors: advanced age (older than 79 for men, 12 for women), diabetes mellitus, dyslipidemia, hypertension, male gender and obesity (BMI >= 30 kg/m2) Current diabetic medications include none.   Current diet: not following any particular diet, diet somewhat high in refined sugar and CHO's Current exercise: none  Current monitoring regimen: none   Objective:    BP 121/70  Pulse 72  Ht 6' 0.5" (1.842 m)  Wt 252 lb (114.306 kg)  BMI 33.69 kg/m2  2 hour post prandial BG was 156 today  Lab Review Glucose (mg/dL)  Date Value  10/28/2013 108*  07/06/2013 109*     Glucose, Bld (mg/dL)  Date Value  02/21/2013 136*  02/20/2013 101*  07/24/2012 139*     CO2 (mmol/L)  Date Value  10/28/2013 27   07/06/2013 25   02/21/2013 24      BUN (mg/dL)  Date Value  10/28/2013 29*  07/06/2013 19   02/21/2013 16   02/20/2013 16   07/24/2012 10      Creatinine, Ser (mg/dL)  Date Value  10/28/2013 1.08   07/06/2013 1.20   02/21/2013 0.94     Assessment:    Diabetes Mellitus type II - improving   Obesity Plan:    1.  Rx changes: Continue metformin XR 500mg  1 tablet daily 2.  Education: Reviewed 'ABCs' of diabetes management (respective goals in parentheses):  A1C (<7), blood pressure (<130/90), and cholesterol (LDL <100).   3.   Compliance at present is estimated to be adequate. Efforts to improve compliance (if necessary) will be directed at dietary modifications: decrease CHO intake (50 grams per meal and 20 grams per snack) increase intake of non starchy vegetables and increased exercise  4. Follow up: 2 months - labs   Total time spent with patient = 25 minutes Cherre Robins, PharmD, CPP

## 2013-12-09 NOTE — Patient Instructions (Signed)
Diabetes and Foot Care Diabetes may cause you to have problems because of poor blood supply (circulation) to your feet and legs. This may cause the skin on your feet to become thinner, break easier, and heal more slowly. Your skin may become dry, and the skin may peel and crack. You may also have nerve damage in your legs and feet causing decreased feeling in them. You may not notice minor injuries to your feet that could lead to infections or more serious problems. Taking care of your feet is one of the most important things you can do for yourself.  HOME CARE INSTRUCTIONS  Wear shoes at all times, even in the house. Do not go barefoot. Bare feet are easily injured.  Check your feet daily for blisters, cuts, and redness. If you cannot see the bottom of your feet, use a mirror or ask someone for help.  Wash your feet with warm water (do not use hot water) and mild soap. Then pat your feet and the areas between your toes until they are completely dry. Do not soak your feet as this can dry your skin.  Apply a moisturizing lotion or petroleum jelly (that does not contain alcohol and is unscented) to the skin on your feet and to dry, brittle toenails. Do not apply lotion between your toes.  Trim your toenails straight across. Do not dig under them or around the cuticle. File the edges of your nails with an emery board or nail file.  Do not cut corns or calluses or try to remove them with medicine.  Wear clean socks or stockings every day. Make sure they are not too tight. Do not wear knee-high stockings since they may decrease blood flow to your legs.  Wear shoes that fit properly and have enough cushioning. To break in new shoes, wear them for just a few hours a day. This prevents you from injuring your feet. Always look in your shoes before you put them on to be sure there are no objects inside.  Do not cross your legs. This may decrease the blood flow to your feet.  If you find a minor scrape,  cut, or break in the skin on your feet, keep it and the skin around it clean and dry. These areas may be cleansed with mild soap and water. Do not cleanse the area with peroxide, alcohol, or iodine.  When you remove an adhesive bandage, be sure not to damage the skin around it.  If you have a wound, look at it several times a day to make sure it is healing.  Do not use heating pads or hot water bottles. They may burn your skin. If you have lost feeling in your feet or legs, you may not know it is happening until it is too late.  Make sure your health care provider performs a complete foot exam at least annually or more often if you have foot problems. Report any cuts, sores, or bruises to your health care provider immediately. SEEK MEDICAL CARE IF:   You have an injury that is not healing.  You have cuts or breaks in the skin.  You have an ingrown nail.  You notice redness on your legs or feet.  You feel burning or tingling in your legs or feet.  You have pain or cramps in your legs and feet.  Your legs or feet are numb.  Your feet always feel cold. SEEK IMMEDIATE MEDICAL CARE IF:   There is increasing redness,   swelling, or pain in or around a wound.  There is a red line that goes up your leg.  Pus is coming from a wound.  You develop a fever or as directed by your health care provider.  You notice a bad smell coming from an ulcer or wound. Document Released: 06/14/2000 Document Revised: 02/17/2013 Document Reviewed: 11/24/2012 [employer] Patient Information 2014 [employer].  

## 2014-01-24 ENCOUNTER — Other Ambulatory Visit: Payer: Self-pay

## 2014-01-24 MED ORDER — FLUTICASONE PROPIONATE 50 MCG/ACT NA SUSP: 2.0000 | Freq: Every day | NASAL | Status: DC

## 2014-01-28 ENCOUNTER — Ambulatory Visit: Payer: No Typology Code available for payment source | Attending: Internal Medicine | Admitting: Physical Therapy

## 2014-01-28 DIAGNOSIS — R293 Abnormal posture: Secondary | ICD-10-CM | POA: Insufficient documentation

## 2014-01-28 DIAGNOSIS — M545 Low back pain, unspecified: Secondary | ICD-10-CM | POA: Diagnosis not present

## 2014-01-28 DIAGNOSIS — I519 Heart disease, unspecified: Secondary | ICD-10-CM | POA: Diagnosis not present

## 2014-01-28 DIAGNOSIS — E119 Type 2 diabetes mellitus without complications: Secondary | ICD-10-CM | POA: Diagnosis not present

## 2014-01-28 DIAGNOSIS — IMO0001 Reserved for inherently not codable concepts without codable children: Secondary | ICD-10-CM | POA: Insufficient documentation

## 2014-01-28 DIAGNOSIS — M51379 Other intervertebral disc degeneration, lumbosacral region without mention of lumbar back pain or lower extremity pain: Secondary | ICD-10-CM | POA: Insufficient documentation

## 2014-01-28 DIAGNOSIS — R5381 Other malaise: Secondary | ICD-10-CM | POA: Diagnosis not present

## 2014-01-28 DIAGNOSIS — M5137 Other intervertebral disc degeneration, lumbosacral region: Secondary | ICD-10-CM | POA: Insufficient documentation

## 2014-01-31 ENCOUNTER — Ambulatory Visit (INDEPENDENT_AMBULATORY_CARE_PROVIDER_SITE_OTHER): Payer: Medicare Other | Admitting: Pharmacist

## 2014-01-31 ENCOUNTER — Ambulatory Visit: Payer: Non-veteran care | Admitting: Physical Therapy

## 2014-01-31 VITALS — BP 132/78 | HR 74 | Ht 72.5 in | Wt 247.5 lb

## 2014-01-31 DIAGNOSIS — R635 Abnormal weight gain: Secondary | ICD-10-CM

## 2014-01-31 DIAGNOSIS — E785 Hyperlipidemia, unspecified: Secondary | ICD-10-CM

## 2014-01-31 DIAGNOSIS — E118 Type 2 diabetes mellitus with unspecified complications: Secondary | ICD-10-CM

## 2014-01-31 DIAGNOSIS — I10 Essential (primary) hypertension: Secondary | ICD-10-CM

## 2014-01-31 DIAGNOSIS — E669 Obesity, unspecified: Secondary | ICD-10-CM

## 2014-01-31 LAB — POCT GLYCOSYLATED HEMOGLOBIN (HGB A1C): Hemoglobin A1C: 5.5

## 2014-01-31 NOTE — Progress Notes (Signed)
Subjective:    Cristian Moore is a 73 y.o. male who presents for follow up of Type 2 diabetes mellitus.   The patient was recently diagnosed with Type 2 diabetes mellitus in April 2015 based on A1c of 7.3%. He is currently taking metformin $RemoveBeforeDE'500mg'[id number]$  once daily.  Known diabetic complications: none Cardiovascular risk factors: advanced age (older than 44 for men, 1 for women), diabetes mellitus, dyslipidemia, hypertension, male gender and obesity (BMI >= 30 kg/m2) Current diabetic medications include none.   Current diet: not following any particular diet, diet somewhat high in refined sugar and CHO's Current exercise: none  Current monitoring regimen: none   Objective:    BP 132/78  Pulse 74  Ht 6' 0.5" (1.842 m)  Wt 247 lb 8 oz (112.265 kg)  BMI 33.09 kg/m2  A1c today was 5.5%  Lab Review Glucose (mg/dL)  Date Value  10/28/2013 108*  07/06/2013 109*     Glucose, Bld (mg/dL)  Date Value  02/21/2013 136*  02/20/2013 101*  07/24/2012 139*     CO2 (mmol/L)  Date Value  10/28/2013 27   07/06/2013 25   02/21/2013 24      BUN (mg/dL)  Date Value  10/28/2013 29*  07/06/2013 19   02/21/2013 16   02/20/2013 16   07/24/2012 10      Creatinine, Ser (mg/dL)  Date Value  10/28/2013 1.08   07/06/2013 1.20   02/21/2013 0.94     Assessment:    Diabetes Mellitus type II - A1c at goals Obesity - patient has lost about 4.5# Hypertriglyceridemia HTN - controllled  Plan:    1.  Rx changes: Continue metformin XR $RemoveBefo'500mg'kEdFaZTLvKA$  1 tablet daily 2.  Education: Reviewed 'ABCs' of diabetes management (respective goals in parentheses):  A1C (<7), blood pressure (<130/90), and cholesterol (LDL <100).   3.   Compliance at presenthas improved. Patient has lost about 4.5#.  Efforts to improve compliance (if necessary) will be directed at dietary modifications: decrease CHO intake (50 grams per meal and 20 grams per snack) increase intake of non starchy vegetables and increased exercise 4.  Orders Placed  This Encounter  Procedures  . CMP14+EGFR  . NMR, lipoprofile  . POCT glycosylated hemoglobin (Hb A1C)      5.  Follow up: 1 months - Dr Laurance Flatten   Total time spent with patient = 25 minutes Cherre Robins, PharmD, CPP, CDE

## 2014-02-01 LAB — CMP14+EGFR
A/G RATIO: 2.2 (ref 1.1–2.5)
ALBUMIN: 4.4 g/dL (ref 3.5–4.8)
ALK PHOS: 44 IU/L (ref 39–117)
ALT: 19 IU/L (ref 0–44)
AST: 19 IU/L (ref 0–40)
BUN / CREAT RATIO: 14 (ref 10–22)
BUN: 16 mg/dL (ref 8–27)
CO2: 24 mmol/L (ref 18–29)
Calcium: 9.7 mg/dL (ref 8.6–10.2)
Chloride: 103 mmol/L (ref 97–108)
Creatinine, Ser: 1.14 mg/dL (ref 0.76–1.27)
GFR, EST AFRICAN AMERICAN: 73 mL/min/{1.73_m2} (ref >59)
GFR, EST NON AFRICAN AMERICAN: 63 mL/min/{1.73_m2} (ref >59)
GLOBULIN, TOTAL: 2 g/dL (ref 1.5–4.5)
Glucose: 111 mg/dL — ABNORMAL HIGH (ref 65–99)
Potassium: 4.5 mmol/L (ref 3.5–5.2)
SODIUM: 143 mmol/L (ref 134–144)
Total Bilirubin: 0.5 mg/dL (ref 0.0–1.2)
Total Protein: 6.4 g/dL (ref 6.0–8.5)

## 2014-02-01 LAB — NMR, LIPOPROFILE
Cholesterol: 155 mg/dL (ref 100–199)
HDL Cholesterol by NMR: 46 mg/dL (ref >39)
HDL PARTICLE NUMBER: 33.4 umol/L (ref >=30.5)
LDL Particle Number: 1115 nmol/L — ABNORMAL HIGH (ref <1000)
LDL Size: 20.2 nm (ref >20.5)
LDLC SERPL CALC-MCNC: 89 mg/dL (ref 0–99)
LP-IR SCORE: 55 — AB (ref <=45)
SMALL LDL PARTICLE NUMBER: 684 nmol/L — AB (ref <=527)
Triglycerides by NMR: 99 mg/dL (ref 0–149)

## 2014-02-02 ENCOUNTER — Ambulatory Visit: Payer: Non-veteran care | Attending: Internal Medicine | Admitting: Physical Therapy

## 2014-02-02 ENCOUNTER — Encounter: Payer: Self-pay | Admitting: Pharmacist

## 2014-02-02 DIAGNOSIS — M5137 Other intervertebral disc degeneration, lumbosacral region: Secondary | ICD-10-CM | POA: Insufficient documentation

## 2014-02-02 DIAGNOSIS — I519 Heart disease, unspecified: Secondary | ICD-10-CM | POA: Insufficient documentation

## 2014-02-02 DIAGNOSIS — E119 Type 2 diabetes mellitus without complications: Secondary | ICD-10-CM | POA: Insufficient documentation

## 2014-02-02 DIAGNOSIS — M545 Low back pain, unspecified: Secondary | ICD-10-CM | POA: Insufficient documentation

## 2014-02-02 DIAGNOSIS — M51379 Other intervertebral disc degeneration, lumbosacral region without mention of lumbar back pain or lower extremity pain: Secondary | ICD-10-CM | POA: Insufficient documentation

## 2014-02-02 DIAGNOSIS — IMO0001 Reserved for inherently not codable concepts without codable children: Secondary | ICD-10-CM | POA: Insufficient documentation

## 2014-02-02 DIAGNOSIS — R293 Abnormal posture: Secondary | ICD-10-CM | POA: Insufficient documentation

## 2014-02-02 DIAGNOSIS — R5381 Other malaise: Secondary | ICD-10-CM | POA: Insufficient documentation

## 2014-02-08 ENCOUNTER — Other Ambulatory Visit: Payer: Self-pay | Admitting: *Deleted

## 2014-02-08 MED ORDER — TAMSULOSIN HCL 0.4 MG PO CAPS: 0.4000 mg | ORAL_CAPSULE | Freq: Every day | ORAL | Status: DC

## 2014-02-09 ENCOUNTER — Ambulatory Visit: Payer: Non-veteran care | Admitting: Physical Therapy

## 2014-02-16 ENCOUNTER — Ambulatory Visit: Payer: Non-veteran care | Admitting: Physical Therapy

## 2014-02-23 ENCOUNTER — Ambulatory Visit: Payer: Non-veteran care | Admitting: Physical Therapy

## 2014-02-23 ENCOUNTER — Other Ambulatory Visit: Payer: Self-pay | Admitting: Family Medicine

## 2014-02-24 MED ORDER — ZOLPIDEM TARTRATE 10 MG PO TABS: 10.0000 mg | ORAL_TABLET | Freq: Every day | ORAL | Status: DC

## 2014-02-24 NOTE — Telephone Encounter (Signed)
Phoned into [facility]

## 2014-03-01 ENCOUNTER — Ambulatory Visit: Payer: Non-veteran care | Attending: Internal Medicine | Admitting: Physical Therapy

## 2014-03-01 DIAGNOSIS — IMO0001 Reserved for inherently not codable concepts without codable children: Secondary | ICD-10-CM | POA: Insufficient documentation

## 2014-03-01 DIAGNOSIS — I519 Heart disease, unspecified: Secondary | ICD-10-CM | POA: Insufficient documentation

## 2014-03-01 DIAGNOSIS — M5137 Other intervertebral disc degeneration, lumbosacral region: Secondary | ICD-10-CM | POA: Insufficient documentation

## 2014-03-01 DIAGNOSIS — M545 Low back pain, unspecified: Secondary | ICD-10-CM | POA: Insufficient documentation

## 2014-03-01 DIAGNOSIS — M51379 Other intervertebral disc degeneration, lumbosacral region without mention of lumbar back pain or lower extremity pain: Secondary | ICD-10-CM | POA: Insufficient documentation

## 2014-03-01 DIAGNOSIS — E119 Type 2 diabetes mellitus without complications: Secondary | ICD-10-CM | POA: Insufficient documentation

## 2014-03-01 DIAGNOSIS — R5381 Other malaise: Secondary | ICD-10-CM | POA: Insufficient documentation

## 2014-03-01 DIAGNOSIS — R293 Abnormal posture: Secondary | ICD-10-CM | POA: Insufficient documentation

## 2014-03-02 ENCOUNTER — Telehealth: Payer: Self-pay | Admitting: Family Medicine

## 2014-03-02 ENCOUNTER — Other Ambulatory Visit: Payer: Self-pay | Admitting: *Deleted

## 2014-03-02 MED ORDER — METFORMIN HCL ER 500 MG PO TB24: 500.0000 mg | ORAL_TABLET | Freq: Every day | ORAL | Status: DC

## 2014-03-08 ENCOUNTER — Encounter: Payer: Self-pay | Admitting: Family Medicine

## 2014-03-08 ENCOUNTER — Ambulatory Visit (INDEPENDENT_AMBULATORY_CARE_PROVIDER_SITE_OTHER): Payer: Medicare Other | Admitting: Family Medicine

## 2014-03-08 ENCOUNTER — Ambulatory Visit: Payer: Non-veteran care | Admitting: *Deleted

## 2014-03-08 VITALS — BP 116/67 | HR 63 | Temp 97.1°F | Ht 72.5 in | Wt 247.0 lb

## 2014-03-08 DIAGNOSIS — I10 Essential (primary) hypertension: Secondary | ICD-10-CM

## 2014-03-08 DIAGNOSIS — E118 Type 2 diabetes mellitus with unspecified complications: Secondary | ICD-10-CM

## 2014-03-08 DIAGNOSIS — I6529 Occlusion and stenosis of unspecified carotid artery: Secondary | ICD-10-CM

## 2014-03-08 LAB — POCT UA - MICROALBUMIN: Microalbumin Ur, POC: NEGATIVE mg/L

## 2014-03-08 MED ORDER — GLUCOSE BLOOD VI STRP: ORAL_STRIP | Status: DC

## 2014-03-08 MED ORDER — ONETOUCH ULTRASOFT LANCETS MISC: Status: DC

## 2014-03-08 NOTE — Addendum Note (Signed)
Addended by: Zannie Cove on: 03/08/2014 09:19 AM   Modules accepted: Orders

## 2014-03-08 NOTE — Progress Notes (Signed)
Subjective:    Patient ID: Cristian Moore, male    DOB: 1941-03-19, 73 y.o.   MRN: [id number]  HPI Pt here for follow up and management of chronic medical problems. The patient has recently been diagnosed with diabetes. He has been seeing our diabetic educator, the clinical pharmacist for help with this. He seems to be taking this more seriously and working to get his blood sugar under better control. He does complain of some ongoing problems with his back today. He is getting physical therapy for his back, and when this is completed he will return to the orthopedist if he is not better for possible injections. He has lost some weight since the last visit. The patient is currently not checking any blood sugars at home. The patient does not describe any incontinence other than some dribbling post voiding at times. His lab work from one month ago will be reviewed with him today. He has lost 6 pounds of weight since the last visit.         Patient Active Problem List   Diagnosis Date Noted  . DM (diabetes mellitus), type 2 with complications 11/94/1740  . H/O surgical procedure 07/27/2013  . Acid reflux 06/03/2013  . 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 Personal history of other diseases of circulatory system 06/03/2013  . Loss of peripheral visual field 05/18/2013  . Non-Obstructive CAD 03/05/2013  . Essential hypertension 02/22/2013  . Shortness of breath   . Obstructive sleep apnea   . Diverticulitis, recurrent, s/p lap sigmoid colectomy 07/23/2012 06/02/2012  . CAROTID ARTERY DISEASE 09/28/2009  . Obesity (BMI 30-39.9) 08/02/2009  . DYSLIPIDEMIA 08/01/2009   Outpatient Encounter Prescriptions as of 03/08/2014  Medication Sig  . aspirin 81 MG tablet Take 1 tablet (81 mg total) by mouth daily.  . cholecalciferol (VITAMIN D) 1000 UNITS tablet Take 2,000 Units by mouth daily.   . Coenzyme Q10 (CO Q-10) 100 MG CAPS Take 100 mg by mouth daily.  . fish oil-omega-3 fatty acids 1000 MG  capsule Take 1 g by mouth 2 (two) times daily.   . fluticasone (FLONASE) 50 MCG/ACT nasal spray Place 2 sprays into both nostrils daily.  . metFORMIN (GLUCOPHAGE-XR) 500 MG 24 hr tablet Take 1 tablet (500 mg total) by mouth daily with breakfast.  . metoprolol (LOPRESSOR) 50 MG tablet Take 0.5 tablets (25 mg total) by mouth 2 (two) times daily.  . Multiple Vitamins-Minerals (CENTRUM SILVER PO) Take 1 tablet by mouth daily.  Marland Kitchen omeprazole (PRILOSEC) 20 MG capsule Take 1 capsule (20 mg total) by mouth daily. EVERY AM BEFORE BREAKFAST  . simvastatin (ZOCOR) 20 MG tablet Take 1 tablet (20 mg total) by mouth at bedtime.  . sodium chloride (OCEAN) 0.65 % nasal spray Place 1 spray into the nose as needed for congestion.  . sucralfate (CARAFATE) 1 G tablet Take 1 tablet (1 g total) by mouth 2 (two) times daily.  . tamsulosin (FLOMAX) 0.4 MG CAPS capsule Take 1 capsule (0.4 mg total) by mouth at bedtime.  Marland Kitchen zolpidem (AMBIEN) 10 MG tablet Take 1 tablet (10 mg total) by mouth at bedtime.  . nitroGLYCERIN (NITROSTAT) 0.4 MG SL tablet Place 1 tablet (0.4 mg total) under the tongue every 5 (five) minutes as needed. Chest pains    Review of Systems  Constitutional: Negative.   HENT: Negative.   Eyes: Negative.   Respiratory: Negative.   Cardiovascular: Negative.   Gastrointestinal: Negative.   Endocrine: Negative.   Genitourinary: Negative.   Musculoskeletal:  Positive for back pain (going to therapy ).  Skin: Negative.   Allergic/Immunologic: Negative.   Neurological: Negative.   Hematological: Negative.   Psychiatric/Behavioral: Negative.        Objective:   Physical Exam  Nursing note and vitals reviewed. Constitutional: He is oriented to person, place, and time. He appears well-developed and well-nourished. No distress.  HENT:  Head: Normocephalic and atraumatic.  Right Ear: External ear normal.  Left Ear: External ear normal.  Nose: Nose normal.  Mouth/Throat: Oropharynx is clear and  moist. No oropharyngeal exudate.  Eyes: Conjunctivae and EOM are normal. Pupils are equal, round, and reactive to light. Right eye exhibits no discharge. Left eye exhibits no discharge. No scleral icterus.  Neck: Normal range of motion. Neck supple. No thyromegaly present.  No carotid bruits  Cardiovascular: Normal rate, regular rhythm, normal heart sounds and intact distal pulses.  Exam reveals no gallop and no friction rub.   No murmur heard. At 72 per minute  Pulmonary/Chest: Effort normal and breath sounds normal. No respiratory distress. He has no wheezes. He has no rales. He exhibits no tenderness.  No axillary adenopathy  Abdominal: Soft. Bowel sounds are normal. He exhibits no mass. There is no tenderness. There is no rebound and no guarding.  No abdominal masses or inguinal adenopathy  Musculoskeletal: Normal range of motion. He exhibits no edema and no tenderness.  Lymphadenopathy:    He has no cervical adenopathy.  Neurological: He is alert and oriented to person, place, and time. He has normal reflexes. No cranial nerve deficit.  Skin: Skin is warm and dry. No rash noted. No erythema. No pallor.  Psychiatric: He has a normal mood and affect. His behavior is normal. Judgment and thought content normal.   BP 116/67  Pulse 63  Temp(Src) 97.1 F (36.2 C) (Oral)  Ht 6' 0.5" (1.842 m)  Wt 247 lb (112.038 kg)  BMI 33.02 kg/m2        Assessment & Plan:  1. CAROTID ARTERY DISEASE  2. Essential hypertension  3. DM (diabetes mellitus), type 2 with complications  Patient Instructions                       Medicare Annual Wellness Visit  [facility] and the medical providers at [facility] strive to bring you the best medical care.  In doing so we not only want to address your current medical conditions and concerns but also to detect new conditions early and prevent illness, disease and health-related problems.    Medicare offers a yearly Wellness  Visit which allows our clinical staff to assess your need for preventative services including immunizations, lifestyle education, counseling to decrease risk of preventable diseases and screening for fall risk and other medical concerns.    This visit is provided free of charge (no copay) for all Medicare recipients. The clinical pharmacists at [facility] have begun to conduct these Wellness Visits which will also include a thorough review of all your medications.    As you primary medical provider recommend that you make an appointment for your Annual Wellness Visit if you have not done so already this year.  You may set up this appointment before you leave today or you may call back ([phone]) and schedule an appointment.  Please make sure when you call that you mention that you are scheduling your Annual Wellness Visit with the clinical pharmacist so that the appointment may be made for  the proper length of time.     Continue current medications. Continue good therapeutic lifestyle changes which include good diet and exercise. Fall precautions discussed with patient. If an FOBT was given today- please return it to our front desk. If you are over 27 years old - you may need Prevnar 27 or the adult Pneumonia vaccine.  Flu Shots will be available at our office starting mid- September. Please call and schedule a FLU CLINIC APPOINTMENT.   Monitor blood sugars and blood pressures regularly, bringing up blood sugar readings to your next visit Continue with physical therapy     Arrie Senate MD

## 2014-03-08 NOTE — Patient Instructions (Addendum)
Medicare Annual Wellness Visit  [facility] and the medical providers at [facility] strive to bring you the best medical care.  In doing so we not only want to address your current medical conditions and concerns but also to detect new conditions early and prevent illness, disease and health-related problems.    Medicare offers a yearly Wellness Visit which allows our clinical staff to assess your need for preventative services including immunizations, lifestyle education, counseling to decrease risk of preventable diseases and screening for fall risk and other medical concerns.    This visit is provided free of charge (no copay) for all Medicare recipients. The clinical pharmacists at [facility] have begun to conduct these Wellness Visits which will also include a thorough review of all your medications.    As you primary medical provider recommend that you make an appointment for your Annual Wellness Visit if you have not done so already this year.  You may set up this appointment before you leave today or you may call back ([phone]) and schedule an appointment.  Please make sure when you call that you mention that you are scheduling your Annual Wellness Visit with the clinical pharmacist so that the appointment may be made for the proper length of time.     Continue current medications. Continue good therapeutic lifestyle changes which include good diet and exercise. Fall precautions discussed with patient. If an FOBT was given today- please return it to our front desk. If you are over 70 years old - you may need Prevnar 24 or the adult Pneumonia vaccine.  Flu Shots will be available at our office starting mid- September. Please call and schedule a FLU CLINIC APPOINTMENT.   Monitor blood sugars and blood pressures regularly, bringing up blood sugar readings to your next visit Continue with physical therapy

## 2014-03-10 ENCOUNTER — Encounter: Payer: Medicare Other | Admitting: Physical Therapy

## 2014-03-15 ENCOUNTER — Ambulatory Visit: Payer: Non-veteran care | Admitting: Physical Therapy

## 2014-03-23 ENCOUNTER — Telehealth: Payer: Self-pay | Admitting: Family Medicine

## 2014-03-23 MED ORDER — OMEPRAZOLE 20 MG PO CPDR: 20.0000 mg | DELAYED_RELEASE_CAPSULE | Freq: Every day | ORAL | Status: DC

## 2014-03-23 NOTE — Telephone Encounter (Signed)
done

## 2014-03-24 ENCOUNTER — Ambulatory Visit: Payer: Non-veteran care | Admitting: Physical Therapy

## 2014-03-29 ENCOUNTER — Telehealth: Payer: Self-pay | Admitting: Family Medicine

## 2014-03-29 MED ORDER — OMEPRAZOLE 20 MG PO CPDR: 20.0000 mg | DELAYED_RELEASE_CAPSULE | Freq: Two times a day (BID) | ORAL | Status: DC

## 2014-03-29 NOTE — Telephone Encounter (Signed)
Please change this prescription in the computer to 2 daily as requested by patient and refill this. Patient may need a new prescription stating this peer

## 2014-03-29 NOTE — Telephone Encounter (Signed)
No where does it say 2 a day?

## 2014-03-30 ENCOUNTER — Other Ambulatory Visit: Payer: Self-pay | Admitting: Family Medicine

## 2014-03-30 ENCOUNTER — Ambulatory Visit: Payer: Non-veteran care | Admitting: *Deleted

## 2014-04-01 ENCOUNTER — Ambulatory Visit: Payer: No Typology Code available for payment source | Attending: Internal Medicine | Admitting: Physical Therapy

## 2014-04-01 DIAGNOSIS — R5381 Other malaise: Secondary | ICD-10-CM | POA: Insufficient documentation

## 2014-04-01 DIAGNOSIS — R293 Abnormal posture: Secondary | ICD-10-CM | POA: Insufficient documentation

## 2014-04-01 DIAGNOSIS — M5136 Other intervertebral disc degeneration, lumbar region: Secondary | ICD-10-CM | POA: Insufficient documentation

## 2014-04-01 DIAGNOSIS — E119 Type 2 diabetes mellitus without complications: Secondary | ICD-10-CM | POA: Diagnosis not present

## 2014-04-01 DIAGNOSIS — M545 Low back pain: Secondary | ICD-10-CM | POA: Insufficient documentation

## 2014-04-04 ENCOUNTER — Ambulatory Visit: Payer: No Typology Code available for payment source | Admitting: Physical Therapy

## 2014-04-04 DIAGNOSIS — M5136 Other intervertebral disc degeneration, lumbar region: Secondary | ICD-10-CM | POA: Diagnosis not present

## 2014-06-09 ENCOUNTER — Encounter (HOSPITAL_COMMUNITY): Payer: Self-pay | Admitting: Cardiology

## 2014-06-18 ENCOUNTER — Other Ambulatory Visit: Payer: Self-pay | Admitting: Family Medicine

## 2014-06-20 NOTE — Telephone Encounter (Signed)
Last filled 04/26/14, last seen 03/08/14. Call into [facility], if approved

## 2014-07-04 ENCOUNTER — Ambulatory Visit (INDEPENDENT_AMBULATORY_CARE_PROVIDER_SITE_OTHER): Payer: Medicare Other | Admitting: Family Medicine

## 2014-07-04 ENCOUNTER — Other Ambulatory Visit: Payer: Medicare Other

## 2014-07-04 ENCOUNTER — Encounter: Payer: Self-pay | Admitting: Family Medicine

## 2014-07-04 VITALS — BP 142/81 | HR 70 | Temp 97.6°F | Ht 72.5 in | Wt 257.0 lb

## 2014-07-04 DIAGNOSIS — E559 Vitamin D deficiency, unspecified: Secondary | ICD-10-CM | POA: Diagnosis not present

## 2014-07-04 DIAGNOSIS — E118 Type 2 diabetes mellitus with unspecified complications: Secondary | ICD-10-CM | POA: Diagnosis not present

## 2014-07-04 DIAGNOSIS — I10 Essential (primary) hypertension: Secondary | ICD-10-CM | POA: Diagnosis not present

## 2014-07-04 DIAGNOSIS — J0100 Acute maxillary sinusitis, unspecified: Secondary | ICD-10-CM

## 2014-07-04 DIAGNOSIS — E119 Type 2 diabetes mellitus without complications: Secondary | ICD-10-CM | POA: Diagnosis not present

## 2014-07-04 LAB — POCT CBC
GRANULOCYTE PERCENT: 70.5 % (ref 37–80)
HEMATOCRIT: 45.9 % (ref 43.5–53.7)
Hemoglobin: 14.6 g/dL (ref 14.1–18.1)
LYMPH, POC: 2 (ref 0.6–3.4)
MCH, POC: 30.1 pg (ref 27–31.2)
MCHC: 31.9 g/dL (ref 31.8–35.4)
MCV: 94.4 fL (ref 80–97)
MPV: 8.5 fL (ref 0–99.8)
POC Granulocyte: 5.4 (ref 2–6.9)
POC LYMPH PERCENT: 26.1 %L (ref 10–50)
Platelet Count, POC: 247 10*3/uL (ref 142–424)
RBC: 4.9 M/uL (ref 4.69–6.13)
RDW, POC: 12.9 %
WBC: 7.7 10*3/uL (ref 4.6–10.2)

## 2014-07-04 LAB — POCT GLYCOSYLATED HEMOGLOBIN (HGB A1C): Hemoglobin A1C: 5.6

## 2014-07-04 MED ORDER — MELOXICAM 7.5 MG PO TABS: 7.5000 mg | ORAL_TABLET | Freq: Every day | ORAL | Status: DC

## 2014-07-04 MED ORDER — AZITHROMYCIN 250 MG PO TABS: ORAL_TABLET | ORAL | Status: DC

## 2014-07-04 NOTE — Progress Notes (Signed)
   Subjective:    Patient ID: Cristian Moore, male    DOB: [birth year], 74 y.o.   MRN: [id number]  HPI  74 year old gentleman who presents with a two-week history of head congestion, ear fullness, and cough productive of grayish green sputum. He's been taking Mucinex D as well as Flonase without relief. He denies any chest congestion or shortness of breath.     Review of Systems  HENT: Positive for congestion and sinus pressure.   Respiratory: Negative.   Cardiovascular: Negative.   Neurological: Negative.        Objective:   Physical Exam  Constitutional: He appears well-developed.  HENT:  Maxillary sinuses are tender to percussion  Left tympanic membrane is dull and immobile with Valsalva maneuver  Cardiovascular: Normal rate.   Pulmonary/Chest: Effort normal and breath sounds normal.          Assessment & Plan:  BP 142/81 mmHg  Pulse 70  Temp(Src) 97.6 F (36.4 C) (Oral)  Ht 6' 0.5" (1.842 m)  Wt 257 lb (116.574 kg)  BMI 34.36 kg/m2  1. Acute maxillary sinusitis, recurrence not specified Probable sinusitis given two-week history of symptoms plan Will treat with Zithromax, after screening for coronary artery disease,. Continue Mucinex but just plain Mucinex without the decongestant which seems to [facility].  2.Tendonitis Patient complains of pain left inner elbow pain increases with pronation against resistance tender to palpation. This probably represents medial epicondylitis. History of illicits no provocative factors. Will treat with meloxicam 7.5 mg once a day if no improvement in 2 weeks consider steroid injection  Wardell Honour MD

## 2014-07-05 LAB — BMP8+EGFR
BUN / CREAT RATIO: 19 (ref 10–22)
BUN: 18 mg/dL (ref 8–27)
CHLORIDE: 102 mmol/L (ref 97–108)
CO2: 27 mmol/L (ref 18–29)
Calcium: 9.6 mg/dL (ref 8.6–10.2)
Creatinine, Ser: 0.95 mg/dL (ref 0.76–1.27)
GFR calc Af Amer: 91 mL/min/{1.73_m2} (ref >59)
GFR calc non Af Amer: 79 mL/min/{1.73_m2} (ref >59)
Glucose: 114 mg/dL — ABNORMAL HIGH (ref 65–99)
POTASSIUM: 5 mmol/L (ref 3.5–5.2)
Sodium: 143 mmol/L (ref 134–144)

## 2014-07-05 LAB — NMR, LIPOPROFILE
Cholesterol: 179 mg/dL (ref 100–199)
HDL CHOLESTEROL BY NMR: 56 mg/dL (ref >39)
HDL Particle Number: 39.4 umol/L (ref >=30.5)
LDL PARTICLE NUMBER: 1041 nmol/L — AB (ref <1000)
LDL Size: 20.5 nm (ref >20.5)
LDL-C: 99 mg/dL (ref 0–99)
LP-IR Score: 59 — ABNORMAL HIGH (ref <=45)
Small LDL Particle Number: 529 nmol/L — ABNORMAL HIGH (ref <=527)
TRIGLYCERIDES BY NMR: 119 mg/dL (ref 0–149)

## 2014-07-05 LAB — HEPATIC FUNCTION PANEL
ALK PHOS: 45 IU/L (ref 39–117)
ALT: 16 IU/L (ref 0–44)
AST: 17 IU/L (ref 0–40)
Albumin: 4.3 g/dL (ref 3.5–4.8)
BILIRUBIN TOTAL: 0.4 mg/dL (ref 0.0–1.2)
Bilirubin, Direct: 0.11 mg/dL (ref 0.00–0.40)
Total Protein: 6.6 g/dL (ref 6.0–8.5)

## 2014-07-05 LAB — VITAMIN D 25 HYDROXY (VIT D DEFICIENCY, FRACTURES): Vit D, 25-Hydroxy: 40.6 ng/mL (ref 30.0–100.0)

## 2014-07-12 ENCOUNTER — Telehealth: Payer: Self-pay | Admitting: Family Medicine

## 2014-07-12 NOTE — Telephone Encounter (Signed)
Z pk is still working in his system.  Please allow a little more time  for medicine to work.   Increase fluids and continue with mucinex to help with coughing out phlegm.  Could be viral.  Call us if no improvement in several days.

## 2014-07-19 ENCOUNTER — Ambulatory Visit (INDEPENDENT_AMBULATORY_CARE_PROVIDER_SITE_OTHER): Payer: Medicare Other | Admitting: Family Medicine

## 2014-07-19 ENCOUNTER — Encounter: Payer: Self-pay | Admitting: Family Medicine

## 2014-07-19 VITALS — BP 141/90 | HR 75 | Temp 97.0°F | Ht 72.5 in | Wt 255.0 lb

## 2014-07-19 DIAGNOSIS — M791 Myalgia, unspecified site: Secondary | ICD-10-CM

## 2014-07-19 DIAGNOSIS — R059 Cough, unspecified: Secondary | ICD-10-CM

## 2014-07-19 DIAGNOSIS — E559 Vitamin D deficiency, unspecified: Secondary | ICD-10-CM | POA: Diagnosis not present

## 2014-07-19 DIAGNOSIS — E118 Type 2 diabetes mellitus with unspecified complications: Secondary | ICD-10-CM | POA: Diagnosis not present

## 2014-07-19 DIAGNOSIS — J209 Acute bronchitis, unspecified: Secondary | ICD-10-CM

## 2014-07-19 DIAGNOSIS — G4700 Insomnia, unspecified: Secondary | ICD-10-CM

## 2014-07-19 DIAGNOSIS — N400 Enlarged prostate without lower urinary tract symptoms: Secondary | ICD-10-CM | POA: Diagnosis not present

## 2014-07-19 DIAGNOSIS — I10 Essential (primary) hypertension: Secondary | ICD-10-CM | POA: Diagnosis not present

## 2014-07-19 DIAGNOSIS — R05 Cough: Secondary | ICD-10-CM

## 2014-07-19 MED ORDER — ONETOUCH DELICA LANCETS 33G MISC: Status: DC

## 2014-07-19 MED ORDER — KETOROLAC TROMETHAMINE 0.5 % OP SOLN: 1.0000 [drp] | Freq: Four times a day (QID) | OPHTHALMIC | Status: DC | PRN

## 2014-07-19 MED ORDER — NEOMYCIN-POLYMYXIN-HC 3.5-10000-1 OT SOLN: 3.0000 [drp] | Freq: Three times a day (TID) | OTIC | Status: DC | PRN

## 2014-07-19 MED ORDER — CEFDINIR 300 MG PO CAPS: 300.0000 mg | ORAL_CAPSULE | Freq: Two times a day (BID) | ORAL | Status: DC

## 2014-07-19 NOTE — Progress Notes (Addendum)
Subjective:    Patient ID: Cristian Moore, male    DOB: 1941-04-22, 74 y.o.   MRN: [id number]  HPI Pt here for follow up and management of chronic medical problems which include diabetes and hypertension. He is taking medications regularly. The patient today complains of head, sinus, and chest congestion. He also complains of myalgias in his lower legs. The patient brings in home blood sugars and home blood pressures for review and all of these are good except for just a few outliers on the blood pressure but the majority of them are good and at goal. The patient was recently treated with a Z-Pak for his cough and congestion. He is still having cough and congestion and coughing up greenish yellow sputum and has an irritated airway. He is also having some TMJ popping symptoms on the left side and still having some issues with his left elbow        Patient Active Problem List   Diagnosis Date Noted  . DM (diabetes mellitus), type 2 with complications 61/95/0932  . H/O surgical procedure 07/27/2013  . Acid reflux 06/03/2013  . 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 Personal history of other diseases of circulatory system 06/03/2013  . Loss of peripheral visual field 05/18/2013  . Non-Obstructive CAD 03/05/2013  . Essential hypertension 02/22/2013  . Shortness of breath   . Obstructive sleep apnea   . Diverticulitis, recurrent, s/p lap sigmoid colectomy 07/23/2012 06/02/2012  . CAROTID ARTERY DISEASE 09/28/2009  . Obesity (BMI 30-39.9) 08/02/2009  . DYSLIPIDEMIA 08/01/2009   Outpatient Encounter Prescriptions as of 07/19/2014  Medication Sig  . aspirin 81 MG tablet Take 1 tablet (81 mg total) by mouth daily.  . cholecalciferol (VITAMIN D) 1000 UNITS tablet Take 2,000 Units by mouth daily.   . fluticasone (FLONASE) 50 MCG/ACT nasal spray Place 2 sprays into both nostrils daily.  Marland Kitchen glucose blood (ONETOUCH VERIO) test strip Check BS BID and PRN. Dx.250.0  . ketorolac (ACULAR) 0.5 %  ophthalmic solution Place 1 drop into both eyes 4 (four) times daily as needed.  . meloxicam (MOBIC) 7.5 MG tablet Take 1 tablet (7.5 mg total) by mouth daily.  . metFORMIN (GLUCOPHAGE-XR) 500 MG 24 hr tablet Take 1 tablet by mouth  daily with breakfast  . metoprolol (LOPRESSOR) 50 MG tablet Take 0.5 tablets (25 mg total) by mouth 2 (two) times daily.  . Multiple Vitamins-Minerals (CENTRUM SILVER PO) Take 1 tablet by mouth daily.  Marland Kitchen neomycin-polymyxin-hydrocortisone (CORTISPORIN) otic solution Place 3 drops into both ears 3 (three) times daily as needed.  Marland Kitchen omeprazole (PRILOSEC) 20 MG capsule Take 1 capsule (20 mg total) by mouth 2 (two) times daily before a meal. EVERY AM BEFORE BREAKFAST  . ONETOUCH DELICA LANCETS 67T MISC Check BS BID and PRN. DX. E11.8  . simvastatin (ZOCOR) 20 MG tablet Take 1 tablet (20 mg total) by mouth at bedtime.  . sodium chloride (OCEAN) 0.65 % nasal spray Place 1 spray into the nose as needed for congestion.  . sucralfate (CARAFATE) 1 G tablet Take 1 tablet (1 g total) by mouth 2 (two) times daily.  . tamsulosin (FLOMAX) 0.4 MG CAPS capsule Take 1 capsule (0.4 mg  total) by mouth at bedtime.  Marland Kitchen zolpidem (AMBIEN) 10 MG tablet TAKE ONE TABLET AT BEDTIME  . [DISCONTINUED] fish oil-omega-3 fatty acids 1000 MG capsule Take 1 g by mouth 2 (two) times daily.   . Coenzyme Q10 (CO Q-10) 100 MG CAPS Take 100 mg by mouth daily.  Marland Kitchen  nitroGLYCERIN (NITROSTAT) 0.4 MG SL tablet Place 1 tablet (0.4 mg total) under the tongue every 5 (five) minutes as needed. Chest pains (Patient not taking: Reported on 07/19/2014)  . [DISCONTINUED] azithromycin (ZITHROMAX) 250 MG tablet 2 tab stat, then 1 tab qd x 4 days  . [DISCONTINUED] Lancets (ONETOUCH ULTRASOFT) lancets Check BS BID and PRN. Dx.250.0    Review of Systems  Constitutional: Negative.   HENT: Positive for congestion (head and chest) and sinus pressure.   Eyes: Negative.   Respiratory: Negative.   Cardiovascular: Negative.     Gastrointestinal: Negative.   Endocrine: Negative.   Genitourinary: Negative.   Musculoskeletal: Positive for myalgias (bilateral lower legs).  Skin: Negative.   Allergic/Immunologic: Negative.   Neurological: Negative.   Hematological: Negative.   Psychiatric/Behavioral: Negative.        Objective:   Physical Exam  Constitutional: He is oriented to person, place, and time. He appears well-developed and well-nourished. No distress.  HENT:  Head: Normocephalic and atraumatic.  Right Ear: External ear normal.  Left Ear: External ear normal.  Mouth/Throat: No oropharyngeal exudate.  The throat is red posteriorly and there is nasal congestion bilaterally  Eyes: Conjunctivae and EOM are normal. Pupils are equal, round, and reactive to light. Right eye exhibits no discharge. Left eye exhibits no discharge. No scleral icterus.  Neck: Normal range of motion. Neck supple. No thyromegaly present.  No anterior cervical adenopathy or carotid bruits.  Cardiovascular: Normal rate, regular rhythm, normal heart sounds and intact distal pulses.   No murmur heard. At 72/m  Pulmonary/Chest: Effort normal and breath sounds normal. No respiratory distress. He has no wheezes. He has no rales. He exhibits no tenderness.  Dry irritated cough without wheezing or rales  Abdominal: Soft. Bowel sounds are normal. He exhibits no mass. There is no tenderness. There is no rebound and no guarding.  Musculoskeletal: Normal range of motion. He exhibits no edema.  Lymphadenopathy:    He has no cervical adenopathy.  Neurological: He is alert and oriented to person, place, and time. He has normal reflexes. No cranial nerve deficit.  Skin: Skin is warm and dry. No rash noted. No erythema. No pallor.  Psychiatric: He has a normal mood and affect. His behavior is normal. Judgment and thought content normal.  Nursing note and vitals reviewed.  BP 141/90 mmHg  Pulse 75  Temp(Src) 97 F (36.1 C) (Oral)  Ht 6' 0.5"  (1.842 m)  Wt 255 lb (115.667 kg)  BMI 34.09 kg/m2        Assessment & Plan:  1. DM (diabetes mellitus), type 2 with complications -Continue current treatment for diabetes which includes metformin  2. BPH (benign prostatic hyperplasia)  3. Vitamin D deficiency -Continue vitamin D3 2000 daily  4. Essential hypertension -Continue metoprolol and current blood pressure medication  5. Insomnia -Take Ambien as needed and try to take as little as possible  6. Muscle ache -Hold simvastatin for 2 weeks as directed and then restart - CK  7. Acute bronchitis, unspecified organism -Mucinex maximum strength 1 twice daily with a large glass of water - cefdinir (OMNICEF) 300 MG capsule; Take 1 capsule (300 mg total) by mouth 2 (two) times daily.  Dispense: 20 capsule; Refill: 0  8. Cough -Use Mucinex maximum strength - cefdinir (OMNICEF) 300 MG capsule; Take 1 capsule (300 mg total) by mouth 2 (two) times daily.  Dispense: 20 capsule; Refill: 0   Meds ordered this encounter  Medications  . ONETOUCH DELICA LANCETS  33G MISC    Sig: Check BS BID and PRN. DX. E11.8    Dispense:  100 each    Refill:  11  . neomycin-polymyxin-hydrocortisone (CORTISPORIN) otic solution    Sig: Place 3 drops into both ears 3 (three) times daily as needed.    Dispense:  10 mL    Refill:  3  . ketorolac (ACULAR) 0.5 % ophthalmic solution    Sig: Place 1 drop into both eyes 4 (four) times daily as needed.    Dispense:  5 mL    Refill:  3  . cefdinir (OMNICEF) 300 MG capsule    Sig: Take 1 capsule (300 mg total) by mouth 2 (two) times daily.    Dispense:  20 capsule    Refill:  0   Patient Instructions                       Medicare Annual Wellness Visit  [facility] and the medical providers at [facility] strive to bring you the best medical care.  In doing so we not only want to address your current medical conditions and concerns but also to detect new conditions early and  prevent illness, disease and health-related problems.    Medicare offers a yearly Wellness Visit which allows our clinical staff to assess your need for preventative services including immunizations, lifestyle education, counseling to decrease risk of preventable diseases and screening for fall risk and other medical concerns.    This visit is provided free of charge (no copay) for all Medicare recipients. The clinical pharmacists at [facility] have begun to conduct these Wellness Visits which will also include a thorough review of all your medications.    As you primary medical provider recommend that you make an appointment for your Annual Wellness Visit if you have not done so already this year.  You may set up this appointment before you leave today or you may call back [phone]) and schedule an appointment.  Please make sure when you call that you mention that you are scheduling your Annual Wellness Visit with the clinical pharmacist so that the appointment may be made for the proper length of time.     Continue current medications. Continue good therapeutic lifestyle changes which include good diet and exercise. Fall precautions discussed with patient. If an FOBT was given today- please return it to our front desk. If you are over 88 years old - you may need Prevnar 16 or the adult Pneumonia vaccine.  Flu Shots will be available at our office starting mid- September. Please call and schedule a FLU CLINIC APPOINTMENT.   Use Mucinex maximum strength, blue and white in color, 1 twice daily for cough and congestion with a large glass of water Continue to use saline nose spray frequently through the day Continue to use Flonase at night 1-2 sprays each nostril Drink plenty of fluids Use a cool mist humidifier if necessary Keep the house cooler if possible Take antibiotic as directed-----if any sign of a rash discontinue this and call us If problems continue get back  in touch with [place] Continue to take the meloxicam a little bit more regularly to see if it helps the left temporomandibular joint popping and this if it to help the left elbow. If problems continue with these areas get back in touch with [place]. Also because of the myalgias in the lower legs hold the simvastatin for a couple weeks and then  restart it to see if the myalgias goal weight at and if they do make him back when he restarted we will have to look at changing the simvastatin to a different statin drug.   Arrie Senate MD

## 2014-07-19 NOTE — Patient Instructions (Addendum)
Medicare Annual Wellness Visit  [facility] and the medical providers at [facility] strive to bring you the best medical care.  In doing so we not only want to address your current medical conditions and concerns but also to detect new conditions early and prevent illness, disease and health-related problems.    Medicare offers a yearly Wellness Visit which allows our clinical staff to assess your need for preventative services including immunizations, lifestyle education, counseling to decrease risk of preventable diseases and screening for fall risk and other medical concerns.    This visit is provided free of charge (no copay) for all Medicare recipients. The clinical pharmacists at [facility] have begun to conduct these Wellness Visits which will also include a thorough review of all your medications.    As you primary medical provider recommend that you make an appointment for your Annual Wellness Visit if you have not done so already this year.  You may set up this appointment before you leave today or you may call back [phone]) and schedule an appointment.  Please make sure when you call that you mention that you are scheduling your Annual Wellness Visit with the clinical pharmacist so that the appointment may be made for the proper length of time.     Continue current medications. Continue good therapeutic lifestyle changes which include good diet and exercise. Fall precautions discussed with patient. If an FOBT was given today- please return it to our front desk. If you are over 18 years old - you may need Prevnar 69 or the adult Pneumonia vaccine.  Flu Shots will be available at our office starting mid- September. Please call and schedule a FLU CLINIC APPOINTMENT.   Use Mucinex maximum strength, blue and white in color, 1 twice daily for cough and congestion with a large glass of water Continue to use saline nose  spray frequently through the day Continue to use Flonase at night 1-2 sprays each nostril Drink plenty of fluids Use a cool mist humidifier if necessary Keep the house cooler if possible Take antibiotic as directed-----if any sign of a rash discontinue this and call us If problems continue get back in touch with [place] Continue to take the meloxicam a little bit more regularly to see if it helps the left temporomandibular joint popping and this if it to help the left elbow. If problems continue with these areas get back in touch with [place]. Also because of the myalgias in the lower legs hold the simvastatin for a couple weeks and then restart it to see if the myalgias goal weight at and if they do make him back when he restarted we will have to look at changing the simvastatin to a different statin drug.

## 2014-07-19 NOTE — Addendum Note (Signed)
Addended by: Earlene Plater on: 07/19/2014 02:58 PM   Modules accepted: Miquel Dunn

## 2014-07-20 LAB — CK: Total CK: 226 U/L — ABNORMAL HIGH (ref 24–204)

## 2014-07-26 ENCOUNTER — Other Ambulatory Visit: Payer: Medicare Other

## 2014-07-26 DIAGNOSIS — Z1212 Encounter for screening for malignant neoplasm of rectum: Secondary | ICD-10-CM

## 2014-07-26 NOTE — Progress Notes (Signed)
Lab only 

## 2014-07-28 LAB — FECAL OCCULT BLOOD, IMMUNOCHEMICAL: Fecal Occult Bld: NEGATIVE

## 2014-08-10 ENCOUNTER — Encounter: Payer: Self-pay | Admitting: *Deleted

## 2014-08-14 ENCOUNTER — Other Ambulatory Visit: Payer: Self-pay | Admitting: Family Medicine

## 2014-08-16 ENCOUNTER — Ambulatory Visit: Payer: Medicare Other | Admitting: Family Medicine

## 2014-08-17 ENCOUNTER — Ambulatory Visit (INDEPENDENT_AMBULATORY_CARE_PROVIDER_SITE_OTHER): Payer: Medicare Other

## 2014-08-17 ENCOUNTER — Encounter: Payer: Self-pay | Admitting: Family Medicine

## 2014-08-17 ENCOUNTER — Ambulatory Visit (INDEPENDENT_AMBULATORY_CARE_PROVIDER_SITE_OTHER): Payer: Medicare Other | Admitting: Family Medicine

## 2014-08-17 VITALS — BP 125/75 | HR 69 | Temp 97.5°F | Ht 72.5 in | Wt 259.0 lb

## 2014-08-17 DIAGNOSIS — M542 Cervicalgia: Secondary | ICD-10-CM

## 2014-08-17 DIAGNOSIS — J209 Acute bronchitis, unspecified: Secondary | ICD-10-CM

## 2014-08-17 DIAGNOSIS — R05 Cough: Secondary | ICD-10-CM

## 2014-08-17 DIAGNOSIS — J40 Bronchitis, not specified as acute or chronic: Secondary | ICD-10-CM

## 2014-08-17 DIAGNOSIS — R059 Cough, unspecified: Secondary | ICD-10-CM

## 2014-08-17 DIAGNOSIS — Z8639 Personal history of other endocrine, nutritional and metabolic disease: Secondary | ICD-10-CM

## 2014-08-17 LAB — POCT CBC
Granulocyte percent: 67.3 %G (ref 37–80)
HCT, POC: 45.4 % (ref 43.5–53.7)
Hemoglobin: 14 g/dL — AB (ref 14.1–18.1)
Lymph, poc: 2.4 (ref 0.6–3.4)
MCH, POC: 29.2 pg (ref 27–31.2)
MCHC: 30.9 g/dL — AB (ref 31.8–35.4)
MCV: 94.5 fL (ref 80–97)
MPV: 7.5 fL (ref 0–99.8)
POC GRANULOCYTE: 6.8 (ref 2–6.9)
POC LYMPH %: 23.5 % (ref 10–50)
Platelet Count, POC: 313 10*3/uL (ref 142–424)
RBC: 4.81 M/uL (ref 4.69–6.13)
RDW, POC: 12.3 %
WBC: 10.1 10*3/uL (ref 4.6–10.2)

## 2014-08-17 MED ORDER — LEVOFLOXACIN 500 MG PO TABS: 500.0000 mg | ORAL_TABLET | Freq: Every day | ORAL | Status: DC

## 2014-08-17 MED ORDER — METHYLPREDNISOLONE ACETATE 80 MG/ML IJ SUSP
60.0000 mg | Freq: Once | INTRAMUSCULAR | Status: AC
  Administered 2014-08-17: 60 mg via INTRAMUSCULAR

## 2014-08-17 MED ORDER — PREDNISONE 10 MG PO TABS: ORAL_TABLET | ORAL | Status: DC

## 2014-08-17 NOTE — Progress Notes (Signed)
Subjective:    Patient ID: Cristian Moore, male    DOB: 1940-09-23, 74 y.o.   MRN: [id number]  HPI 74 year old male with 1 month history of nonproductive cough. This cough is severe is at times and almost causes him to vomit. It also disturbs his sleep. He also complains of anterior neck pain and tenderness.   Patient Active Problem List   Diagnosis Date Noted  . Insomnia 07/19/2014  . Vitamin D deficiency 07/19/2014  . BPH (benign prostatic hyperplasia) 07/19/2014  . DM (diabetes mellitus), type 2 with complications 56/97/9480  . Acid reflux 06/03/2013  . 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 Personal history of other diseases of circulatory system 06/03/2013  . Loss of peripheral visual field 05/18/2013  . Non-Obstructive CAD 03/05/2013  . Essential hypertension 02/22/2013  . Shortness of breath   . Obstructive sleep apnea   . Diverticulitis, recurrent, s/p lap sigmoid colectomy 07/23/2012 06/02/2012  . CAROTID ARTERY DISEASE 09/28/2009  . Obesity (BMI 30-39.9) 08/02/2009  . DYSLIPIDEMIA 08/01/2009   Outpatient Encounter Prescriptions as of 08/17/2014  Medication Sig  . aspirin 81 MG tablet Take 1 tablet (81 mg total) by mouth daily.  . cholecalciferol (VITAMIN D) 1000 UNITS tablet Take 2,000 Units by mouth daily.   . fluticasone (FLONASE) 50 MCG/ACT nasal spray Place 2 sprays into both nostrils daily.  Marland Kitchen glucose blood (ONETOUCH VERIO) test strip Check BS BID and PRN. Dx.250.0  . ketorolac (ACULAR) 0.5 % ophthalmic solution Place 1 drop into both eyes 4 (four) times daily as needed.  . meloxicam (MOBIC) 7.5 MG tablet Take 1 tablet (7.5 mg total) by mouth daily.  . metFORMIN (GLUCOPHAGE-XR) 500 MG 24 hr tablet Take 1 tablet by mouth  daily with breakfast  . metoprolol (LOPRESSOR) 50 MG tablet Take one-half tablet by  mouth two times daily  . Multiple Vitamins-Minerals (CENTRUM SILVER PO) Take 1 tablet by mouth daily.  . nitroGLYCERIN (NITROSTAT) 0.4 MG SL tablet Place 1 tablet  (0.4 mg total) under the tongue every 5 (five) minutes as needed. Chest pains  . omeprazole (PRILOSEC) 20 MG capsule Take 1 capsule (20 mg total) by mouth 2 (two) times daily before a meal. EVERY AM BEFORE BREAKFAST  . ONETOUCH DELICA LANCETS 16P MISC Check BS BID and PRN. DX. E11.8  . simvastatin (ZOCOR) 20 MG tablet Take 1 tablet (20 mg total) by mouth at bedtime.  . sodium chloride (OCEAN) 0.65 % nasal spray Place 1 spray into the nose as needed for congestion.  . sucralfate (CARAFATE) 1 G tablet Take 1 tablet (1 g total) by mouth 2 (two) times daily.  . tamsulosin (FLOMAX) 0.4 MG CAPS capsule Take 1 capsule (0.4 mg  total) by mouth at bedtime.  Marland Kitchen zolpidem (AMBIEN) 10 MG tablet TAKE ONE TABLET AT BEDTIME  . Coenzyme Q10 (CO Q-10) 100 MG CAPS Take 100 mg by mouth daily.  . [DISCONTINUED] cefdinir (OMNICEF) 300 MG capsule Take 1 capsule (300 mg total) by mouth 2 (two) times daily.  . [DISCONTINUED] neomycin-polymyxin-hydrocortisone (CORTISPORIN) otic solution Place 3 drops into both ears 3 (three) times daily as needed.      Review of Systems  Constitutional: Positive for chills.  HENT: Negative.   Eyes: Negative.   Respiratory: Positive for cough.   Cardiovascular: Negative.   Gastrointestinal: Negative.   Endocrine: Negative.   Genitourinary: Negative.   Musculoskeletal: Negative.   Skin: Negative.   Allergic/Immunologic: Negative.   Neurological: Negative.   Hematological: Negative.  Psychiatric/Behavioral: Negative.        Objective:   Physical Exam  Constitutional: He is oriented to person, place, and time. He appears well-developed and well-nourished. No distress.  HENT:  Head: Normocephalic and atraumatic.  Right Ear: External ear normal.  Left Ear: External ear normal.  Mouth/Throat: Oropharynx is clear and moist. No oropharyngeal exudate.  Nasal congestion bilaterally  Eyes: Conjunctivae and EOM are normal. Pupils are equal, round, and reactive to light. Right eye  exhibits no discharge. Left eye exhibits no discharge. No scleral icterus.  Neck: Normal range of motion. Neck supple. No thyromegaly present.  There was lowered neck soreness bilaterally and anteriorly but no thyromegaly or anterior cervical adenopathy  Cardiovascular: Normal rate, regular rhythm and normal heart sounds.   No murmur heard. Pulmonary/Chest: Effort normal and breath sounds normal. No respiratory distress. He has no wheezes. He has no rales. He exhibits no tenderness.  Lungs were clear anteriorly and posteriorly and the patient had a dry cough  Abdominal: He exhibits no mass.  Musculoskeletal: Normal range of motion.  Lymphadenopathy:    He has no cervical adenopathy.  Neurological: He is alert and oriented to person, place, and time. No cranial nerve deficit.  Skin: Skin is warm and dry. No rash noted.  Psychiatric: He has a normal mood and affect. His behavior is normal. Judgment and thought content normal.  Nursing note and vitals reviewed.    BP 125/75 mmHg  Pulse 69  Temp(Src) 97.5 F (36.4 C) (Oral)  Ht 6' 0.5" (1.842 m)  Wt 259 lb (117.482 kg)  BMI 34.63 kg/m2 [facility] reading (PRIMARY) by  DrMoore-chest x-ray-no active disease  Results for orders placed or performed in visit on 08/17/14  POCT CBC  Result Value Ref Range   WBC 10.1 4.6 - 10.2 K/uL   Lymph, poc 2.4 0.6 - 3.4   POC LYMPH PERCENT 23.5 10 - 50 %L   POC Granulocyte 6.8 2 - 6.9   Granulocyte percent 67.3 37 - 80 %G   RBC 4.81 4.69 - 6.13 M/uL   Hemoglobin 14.0 (A) 14.1 - 18.1 g/dL   HCT, POC 45.4 43.5 - 53.7 %   MCV 94.5 80 - 97 fL   MCH, POC 29.2 27 - 31.2 pg   MCHC 30.9 (A) 31.8 - 35.4 g/dL   RDW, POC 12.3 %   Platelet Count, POC 313 142 - 424 K/uL   MPV 7.5 0 - 99.8 fL                                         Assessment & Plan:  1. Cough -Continue Mucinex, drinking plenty of fluids and using a cool mist humidifier if necessary - DG Chest 2 View; Future - POCT CBC - predniSONE  (DELTASONE) 10 MG tablet; 1 tablet 4 times a day for 2 days,  1 tablet 3 times a day for 2 days,  1 tablet 2 times a day for 2 days, 1 tablet daily for 2 days  Dispense: 20 tablet; Refill: 0  2. Neck pain -Take Tylenol as needed for pain - Thyroid Panel With TSH - Sedimentation rate  3. Bronchitis with bronchospasm -Use Mucinex and take medication as directed - methylPREDNISolone acetate (DEPO-MEDROL) injection 60 mg; Inject 0.75 mLs (60 mg total) into the muscle once. - predniSONE (DELTASONE) 10 MG tablet; 1 tablet 4 times a day for 2  days,  1 tablet 3 times a day for 2 days,  1 tablet 2 times a day for 2 days, 1 tablet daily for 2 days  Dispense: 20 tablet; Refill: 0 - levofloxacin (LEVAQUIN) 500 MG tablet; Take 1 tablet (500 mg total) by mouth daily.  Dispense: 7 tablet; Refill: 0  4. History of thyroiditis -We will call you with the results of the thyroid profile as soon as that becomes available - Thyroid Panel With TSH - Sedimentation rate  Patient Instructions  Continue to drink plenty of fluids Use a cool mist humidifier on your trip and did not let the fan or heating her air-conditioning blowing on you and your bed Continue Mucinex maximum strength twice daily with a large glass of water Continue nasal saline frequently use Take Tylenol for aches pains and fever Watch blood sugars closely because of taking prednisone and getting the shot of Depo-Medrol Take antibiotic regularly with food Take probiotic and watch caffeine intake   Arrie Senate MD

## 2014-08-17 NOTE — Patient Instructions (Signed)
Continue to drink plenty of fluids Use a cool mist humidifier on your trip and did not let the fan or heating her air-conditioning blowing on you and your bed Continue Mucinex maximum strength twice daily with a large glass of water Continue nasal saline frequently use Take Tylenol for aches pains and fever Watch blood sugars closely because of taking prednisone and getting the shot of Depo-Medrol Take antibiotic regularly with food Take probiotic and watch caffeine intake

## 2014-08-18 LAB — THYROID PANEL WITH TSH
FREE THYROXINE INDEX: 1.8 (ref 1.2–4.9)
T3 UPTAKE RATIO: 29 % (ref 24–39)
T4, Total: 6.3 ug/dL (ref 4.5–12.0)
TSH: 0.205 u[IU]/mL — ABNORMAL LOW (ref 0.450–4.500)

## 2014-08-18 LAB — SEDIMENTATION RATE: SED RATE: 10 mm/h (ref 0–30)

## 2014-08-18 NOTE — Addendum Note (Signed)
Addended by: Zannie Cove on: 08/18/2014 03:03 PM   Modules accepted: Orders

## 2014-08-19 ENCOUNTER — Ambulatory Visit
Admission: RE | Admit: 2014-08-19 | Discharge: 2014-08-19 | Disposition: A | Discharge status: Home / Self Care | Payer: Medicare Other | Source: Ambulatory Visit | Attending: Family Medicine | Admitting: Family Medicine

## 2014-08-19 ENCOUNTER — Telehealth: Payer: Self-pay | Admitting: *Deleted

## 2014-08-19 DIAGNOSIS — Z8639 Personal history of other endocrine, nutritional and metabolic disease: Secondary | ICD-10-CM

## 2014-08-19 DIAGNOSIS — E042 Nontoxic multinodular goiter: Secondary | ICD-10-CM | POA: Diagnosis not present

## 2014-08-19 DIAGNOSIS — M542 Cervicalgia: Secondary | ICD-10-CM

## 2014-08-19 NOTE — Telephone Encounter (Signed)
Please call this patient and arrange for him to have a thyroid scan when he returns from [place] after March 2. He can be contacted on his cell phone during his trip if you need to make specific arrangements are give him specific directions.

## 2014-08-19 NOTE — Telephone Encounter (Signed)
Call came from imaging for Thyroid-heterogenous thyroid indeterminate heterogenous for echotexture thyroid vs left/right thyroid nodules assuming abnormalities represent nodules. Meets criteria for bx, if pt is hyperthyroid consider nuclear med thyroid uptake scan.

## 2014-08-23 ENCOUNTER — Other Ambulatory Visit: Payer: Self-pay | Admitting: *Deleted

## 2014-08-23 DIAGNOSIS — E041 Nontoxic single thyroid nodule: Secondary | ICD-10-CM

## 2014-09-05 ENCOUNTER — Encounter (HOSPITAL_COMMUNITY)
Admission: RE | Admit: 2014-09-05 | Discharge: 2014-09-05 | Disposition: A | Discharge status: Home / Self Care | Payer: Medicare Other | Source: Ambulatory Visit | Attending: Family Medicine | Admitting: Family Medicine

## 2014-09-05 ENCOUNTER — Encounter (HOSPITAL_COMMUNITY): Payer: Self-pay

## 2014-09-05 DIAGNOSIS — E041 Nontoxic single thyroid nodule: Secondary | ICD-10-CM | POA: Insufficient documentation

## 2014-09-05 MED ORDER — SODIUM IODIDE I 131 CAPSULE
9.0000 | Freq: Once | INTRAVENOUS | Status: AC | PRN
  Administered 2014-09-05: 9 via ORAL

## 2014-09-06 ENCOUNTER — Encounter (HOSPITAL_COMMUNITY)
Admission: RE | Admit: 2014-09-06 | Discharge: 2014-09-06 | Disposition: A | Discharge status: Home / Self Care | Payer: Medicare Other | Source: Ambulatory Visit | Attending: Family Medicine | Admitting: Family Medicine

## 2014-09-06 DIAGNOSIS — E041 Nontoxic single thyroid nodule: Secondary | ICD-10-CM | POA: Diagnosis not present

## 2014-09-06 DIAGNOSIS — R5383 Other fatigue: Secondary | ICD-10-CM | POA: Diagnosis not present

## 2014-09-06 DIAGNOSIS — R251 Tremor, unspecified: Secondary | ICD-10-CM | POA: Diagnosis not present

## 2014-09-06 DIAGNOSIS — F419 Anxiety disorder, unspecified: Secondary | ICD-10-CM | POA: Diagnosis not present

## 2014-09-06 MED ORDER — SODIUM PERTECHNETATE TC 99M INJECTION
10.0000 | Freq: Once | INTRAVENOUS | Status: AC | PRN
  Administered 2014-09-06: 10 via INTRAVENOUS

## 2014-09-07 ENCOUNTER — Other Ambulatory Visit: Payer: Self-pay

## 2014-09-07 DIAGNOSIS — E069 Thyroiditis, unspecified: Secondary | ICD-10-CM

## 2014-09-12 ENCOUNTER — Telehealth: Payer: Self-pay | Admitting: Family Medicine

## 2014-09-15 DIAGNOSIS — E0789 Other specified disorders of thyroid: Secondary | ICD-10-CM | POA: Diagnosis not present

## 2014-09-16 ENCOUNTER — Other Ambulatory Visit: Payer: Self-pay | Admitting: Family Medicine

## 2014-09-17 NOTE — Telephone Encounter (Signed)
Last filled 08/21/14, last seen 08/17/14. Call into [facility]

## 2014-09-22 DIAGNOSIS — E041 Nontoxic single thyroid nodule: Secondary | ICD-10-CM | POA: Diagnosis not present

## 2014-09-22 DIAGNOSIS — J029 Acute pharyngitis, unspecified: Secondary | ICD-10-CM | POA: Diagnosis not present

## 2014-09-26 ENCOUNTER — Other Ambulatory Visit: Payer: Self-pay

## 2014-09-26 ENCOUNTER — Telehealth: Payer: Self-pay | Admitting: Family Medicine

## 2014-09-26 MED ORDER — TAMSULOSIN HCL 0.4 MG PO CAPS: 0.4000 mg | ORAL_CAPSULE | Freq: Every day | ORAL | Status: DC

## 2014-09-26 NOTE — Telephone Encounter (Signed)
Pt needed refill until mail order arrives [place] per Dr Laurance Flatten

## 2014-09-27 ENCOUNTER — Other Ambulatory Visit: Payer: Self-pay | Admitting: Endocrinology

## 2014-09-27 DIAGNOSIS — E041 Nontoxic single thyroid nodule: Secondary | ICD-10-CM

## 2014-10-13 ENCOUNTER — Ambulatory Visit (INDEPENDENT_AMBULATORY_CARE_PROVIDER_SITE_OTHER): Payer: Medicare Other | Admitting: *Deleted

## 2014-10-13 ENCOUNTER — Encounter: Payer: Self-pay | Admitting: *Deleted

## 2014-10-13 VITALS — Ht 71.0 in | Wt 250.0 lb

## 2014-10-13 DIAGNOSIS — Z0000 Encounter for general adult medical examination without abnormal findings: Secondary | ICD-10-CM | POA: Diagnosis not present

## 2014-10-13 NOTE — Patient Instructions (Signed)
Increase exercise as tolerated. Goal of 30 minutes at least 3 times per week.  Follow up with Dr Nelva Bush about continued back pain.  Keep appointment with Dr Laurance Flatten on 11/24/14. [facility] Have them sent a copy of the report to our office.   Health Maintenance A healthy lifestyle and preventative care can promote health and wellness.  Maintain regular health, dental, and eye exams.  Eat a healthy diet. Foods like vegetables, fruits, whole grains, low-fat dairy products, and lean protein foods contain the nutrients you need and are low in calories. Decrease your intake of foods high in solid fats, added sugars, and salt. Get information about a proper diet from your health care provider, if necessary.  Regular physical exercise is one of the most important things you can do for your health. Most adults should get at least 150 minutes of moderate-intensity exercise (any activity that increases your heart rate and causes you to sweat) each week. In addition, most adults need muscle-strengthening exercises on 2 or more days a week.   Maintain a healthy weight. The body mass index (BMI) is a screening tool to identify possible weight problems. It provides an estimate of body fat based on height and weight. Your health care provider can find your BMI and can help you achieve or maintain a healthy weight. For males 20 years and older:  A BMI below 18.5 is considered underweight.  A BMI of 18.5 to 24.9 is normal.  A BMI of 25 to 29.9 is considered overweight.  A BMI of 30 and above is considered obese.  Maintain normal blood lipids and cholesterol by exercising and minimizing your intake of saturated fat. Eat a balanced diet with plenty of fruits and vegetables. Blood tests for lipids and cholesterol should begin at age 62 and be repeated every 5 years. If your lipid or cholesterol levels are high, you are over age 35, or you are at high risk for heart disease, you may  need your cholesterol levels checked more frequently.Ongoing high lipid and cholesterol levels should be treated with medicines if diet and exercise are not working.  If you smoke, find out from your health care provider how to quit. If you do not use tobacco, do not start.  Lung cancer screening is recommended for adults aged 58-80 years who are at high risk for developing lung cancer because of a history of smoking. A yearly low-dose CT scan of the lungs is recommended for people who have at least a 30-pack-year history of smoking and are current smokers or have quit within the past 15 years. A pack year of smoking is smoking an average of 1 pack of cigarettes a day for 1 year (for example, a 30-pack-year history of smoking could mean smoking 1 pack a day for 30 years or 2 packs a day for 15 years). Yearly screening should continue until the smoker has stopped smoking for at least 15 years. Yearly screening should be stopped for people who develop a health problem that would prevent them from having lung cancer treatment.  If you choose to drink alcohol, do not have more than 2 drinks per day. One drink is considered to be 12 oz (360 mL) of beer, 5 oz (150 mL) of wine, or 1.5 oz (45 mL) of liquor.  Avoid the use of street drugs. Do not share needles with anyone. Ask for help if you need support or instructions about stopping the use of drugs.  High blood pressure causes heart disease and increases the risk of stroke. Blood pressure should be checked at least every 1-2 years. Ongoing high blood pressure should be treated with medicines if weight loss and exercise are not effective.  If you are 101-77 years old, ask your health care provider if you should take aspirin to prevent heart disease.  Diabetes screening involves taking a blood sample to check your fasting blood sugar level. This should be done once every 3 years after age 32 if you are at a normal weight and without risk factors for diabetes.  Testing should be considered at a younger age or be carried out more frequently if you are overweight and have at least 1 risk factor for diabetes.  Colorectal cancer can be detected and often prevented. Most routine colorectal cancer screening begins at the age of 69 and continues through age 54. However, your health care provider may recommend screening at an earlier age if you have risk factors for colon cancer. On a yearly basis, your health care provider may provide home test kits to check for hidden blood in the stool. A small camera at the end of a tube may be used to directly examine the colon (sigmoidoscopy or colonoscopy) to detect the earliest forms of colorectal cancer. Talk to your health care provider about this at age 20 when routine screening begins. A direct exam of the colon should be repeated every 5-10 years through age 9, unless early forms of precancerous polyps or small growths are found.  People who are at an increased risk for hepatitis B should be screened for this virus. You are considered at high risk for hepatitis B if:  You were born in a country where hepatitis B occurs often. Talk with your health care provider about which countries are considered high risk.  Your parents were born in a high-risk country and you have not received a shot to protect against hepatitis B (hepatitis B vaccine).  You have HIV or AIDS.  You use needles to inject street drugs.  You live with, or have sex with, someone who has hepatitis B.  You are a man who has sex with other men (MSM).  You get hemodialysis treatment.  You take certain medicines for conditions like cancer, organ transplantation, and autoimmune conditions.  Hepatitis C blood testing is recommended for all people born from [birth year] through 1966 and any individual with known risk factors for hepatitis C.  Healthy men should no longer receive prostate-specific antigen (PSA) blood tests as part of routine cancer screening.  Talk to your health care provider about prostate cancer screening.  Testicular cancer screening is not recommended for adolescents or adult males who have no symptoms. Screening includes self-exam, a health care provider exam, and other screening tests. Consult with your health care provider about any symptoms you have or any concerns you have about testicular cancer.  Practice safe sex. Use condoms and avoid high-risk sexual practices to reduce the spread of sexually transmitted infections (STIs).  You should be screened for STIs, including gonorrhea and chlamydia if:  You are sexually active and are younger than 24 years.  You are older than 24 years, and your health care provider tells you that you are at risk for this type of infection.  Your sexual activity has changed since you were last screened, and you are at an increased risk for chlamydia or gonorrhea. Ask your health care provider if you are at risk.  If you are  at risk of being infected with HIV, it is recommended that you take a prescription medicine daily to prevent HIV infection. This is called pre-exposure prophylaxis (PrEP). You are considered at risk if:  You are a man who has sex with other men (MSM).  You are a heterosexual man who is sexually active with multiple partners.  You take drugs by injection.  You are sexually active with a partner who has HIV.  Talk with your health care provider about whether you are at high risk of being infected with HIV. If you choose to begin PrEP, you should first be tested for HIV. You should then be tested every 3 months for as long as you are taking PrEP.  Use sunscreen. Apply sunscreen liberally and repeatedly throughout the day. You should seek shade when your shadow is shorter than you. Protect yourself by wearing long sleeves, pants, a wide-brimmed hat, and sunglasses year round whenever you are outdoors.  Tell your health care provider of new moles or changes in moles,  especially if there is a change in shape or color. Also, tell your health care provider if a mole is larger than the size of a pencil eraser.  A one-time screening for abdominal aortic aneurysm (AAA) and surgical repair of large AAAs by ultrasound is recommended for men aged 28-75 years who are current or former smokers.  Stay current with your vaccines (immunizations). Document Released: 12/14/2007 Document Revised: 06/22/2013 Document Reviewed: 11/12/2010 [facility] Patient Information 2015 [place], [place]. This information is not intended to replace advice given to you by your health care provider. Make sure you discuss any questions you have with your health care provider.

## 2014-10-13 NOTE — Progress Notes (Signed)
Subjective:   Cristian Moore is a 74 y.o. male who presents for an Initial Medicare Annual Wellness Visit. [name] lives at home with his wife.   Review of Systems   Cardiac Risk Factors include: advanced age (>56men, >74 women);hypertension;diabetes mellitus;male gender, BMI > 30   Other systems negative.  Objective:    Today's Vitals   10/13/14 1459  Height: 5\' 11"  (1.803 m)  Weight: 250 lb (113.399 kg)  BMI                 34.87  Current Medications (verified) Outpatient Encounter Prescriptions as of 10/13/2014  Medication Sig  . aspirin 81 MG tablet Take 1 tablet (81 mg total) by mouth daily.  . cholecalciferol (VITAMIN D) 1000 UNITS tablet Take 2,000 Units by mouth daily.   . Coenzyme Q10 (CO Q-10) 100 MG CAPS Take 100 mg by mouth daily.  . fluticasone (FLONASE) 50 MCG/ACT nasal spray Place 2 sprays into both nostrils daily.  Marland Kitchen glucose blood (ONETOUCH VERIO) test strip Check BS BID and PRN. Dx.250.0  . ketorolac (ACULAR) 0.5 % ophthalmic solution Place 1 drop into both eyes 4 (four) times daily as needed.  . meloxicam (MOBIC) 7.5 MG tablet Take 1 tablet (7.5 mg total) by mouth daily.  . metFORMIN (GLUCOPHAGE-XR) 500 MG 24 hr tablet Take 1 tablet by mouth  daily with breakfast  . metoprolol (LOPRESSOR) 50 MG tablet Take one-half tablet by  mouth two times daily  . Multiple Vitamins-Minerals (CENTRUM SILVER PO) Take 1 tablet by mouth daily.  . nitroGLYCERIN (NITROSTAT) 0.4 MG SL tablet Place 1 tablet (0.4 mg total) under the tongue every 5 (five) minutes as needed. Chest pains  . omeprazole (PRILOSEC) 20 MG capsule Take 1 capsule (20 mg total) by mouth 2 (two) times daily before a meal. EVERY AM BEFORE BREAKFAST  . ONETOUCH DELICA LANCETS 47Q MISC Check BS BID and PRN. DX. E11.8  . simvastatin (ZOCOR) 20 MG tablet Take 1 tablet (20 mg total) by mouth at bedtime.  . sodium chloride (OCEAN) 0.65 % nasal spray Place 1 spray into the nose as needed for congestion.  .  sucralfate (CARAFATE) 1 G tablet Take 1 tablet (1 g total) by mouth 2 (two) times daily.  . tamsulosin (FLOMAX) 0.4 MG CAPS capsule Take 1 capsule (0.4 mg total) by mouth daily.  Marland Kitchen zolpidem (AMBIEN) 10 MG tablet TAKE ONE TABLET AT BEDTIME  . [DISCONTINUED] levofloxacin (LEVAQUIN) 500 MG tablet Take 1 tablet (500 mg total) by mouth daily.  . [DISCONTINUED] predniSONE (DELTASONE) 10 MG tablet 1 tablet 4 times a day for 2 days,  1 tablet 3 times a day for 2 days,  1 tablet 2 times a day for 2 days, 1 tablet daily for 2 days    Allergies (verified) Penicillins   History: Past Medical History  Diagnosis Date  . Hyperlipidemia   . Musculoskeletal pain     in the right shoulder - S/P ROTATOR CUFF REPAIR-BUT STILL HAS PAIN  . Laceration  01/11/2009     Deep laceration left index finger dorsally  . Diverticulosis   . Carotid stenosis     Carotid US (07/2013):  Bilateral 1-39% ICA (f/u 2 yrs)  . Exogenous obesity   . Thyroiditis   . Diverticulitis   . Benign prostatic hypertrophy   . Arthritis   . GERD (gastroesophageal reflux disease)   . Rectal bleeding     PT ATTRIBUTES TO HEMORRHOIDS  . Difficulty urinating   .  Obstructive sleep apnea      CPAP    AUTO SET 6 TO 20 - USUALLY SETTLES OUT AT 10  . Hernia     Umbulical   . Ulcer 9371  . H/O hiatal hernia   . SUPRAVENTRICULAR TACHYCARDIA 08/01/2009    Qualifier: Diagnosis of  By: Lovette Cliche, CNA, Christy    . Chest pain     a. 01/2013 Cath: LM nl, LAD 25p, D1 25p, LCX nl, RCA 25p, PDA nl, RPL nl, EF 55%.  . Diabetes mellitus without complication 6967   Past Surgical History  Procedure Laterality Date  . Inguinal hernia repair    . Kidney stone surgery    . Tonsillectomy    . Colonoscopy      for polyps  . Rotator cuff repair    . Knee surgery    . Elbow bursa surgery    . Laparoscopic sigmoid colectomy  07/23/2012    Procedure: LAPAROSCOPIC SIGMOID COLECTOMY;  Surgeon: [name], MD;  Location: [facility];  Service: General;   Laterality: N/A;  Laparoscopic Sigmoid Colectomy  . Umbilical hernia repair  07/23/2012    Procedure: HERNIA REPAIR UMBILICAL ADULT;  Surgeon: [name], MD;  Location: [facility];  Service: General;  Laterality: N/A;  Primary Umbilical Hernia Repair  . Proctoscopy  07/23/2012    Procedure: PROCTOSCOPY;  Surgeon: [name], MD;  Location: [facility];  Service: General;  Laterality: N/A;  Rigid Proctoscopy  . Eyelid surgery Bilateral 05/2013  . Left heart catheterization with coronary angiogram N/A 02/22/2013    Procedure: LEFT HEART CATHETERIZATION WITH CORONARY ANGIOGRAM;  Surgeon: [name], MD;  Location: [facility] CATH LAB;  Service: Cardiovascular;  Laterality: N/A;   Family History  Problem Relation Age of Onset  . Stroke Mother   . Heart disease Mother     Valvular heart disease, Rheumatic fever   Social History   Occupational History  . Retired    Social History Main Topics  . Smoking status: Former Smoker    Types: Cigars    Quit date: 08/12/1999  . Smokeless tobacco: Never Used     Comment: smoked cigars on and off for approx 4-5 yrs  . Alcohol Use: No  . Drug Use: No  . Sexual Activity: Not on file    Activities of Daily Living In your present state of health, do you have any difficulty performing the following activities: 10/13/2014  Hearing? Y  Vision? N  Difficulty concentrating or making decisions? N  Walking or climbing stairs? N  Dressing or bathing? N  Doing errands, shopping? N  Preparing Food and eating ? N  Using the Toilet? N  In the past six months, have you accidently leaked urine? N  Do you have problems with loss of bowel control? N  Managing your Medications? N  Managing your Finances? N  Housekeeping or managing your Housekeeping? N   Patient has some high frequency hearing loss and is scheduled for a follow up visit with the [place].   Immunizations and Health Maintenance Immunization History  Administered Date(s) Administered  . Influenza  Split 03/31/2012  . Influenza,inj,Quad PF,36+ Mos 03/19/2013  . Influenza-Unspecified 02/28/2014  . Pneumococcal Conjugate-13 07/08/2013   Health Maintenance Due  Topic Date Due  . PNA vac Low Risk Adult (2 of 2 - PPSV23) 07/08/2014  . OPHTHALMOLOGY EXAM  09/06/2014   Patient reports having an eye exam at the [place] last year. They update his eye glasses prescription every  2 years.   Patient Care Team: Chipper Herb, MD as PCP - General (Family Medicine) Gatha Mayer, MD as Consulting Physician (Gastroenterology) [name], MD as Consulting Physician (Cardiology) Anda Kraft, MD as Consulting Physician (Endocrinology) Christinia Gully, MD as Consulting Physician (Pulmonary) Esmond Plants, MD as Consulting Physician (Orthopedics)  Indicate any recent Medical Services you may have received from other than [facility] providers in the past year (date may be approximate). -08/2014-Treatment for thyroiditis by Dr Wilson Singer at [facility].     Assessment:   This is a routine wellness examination for Norman.   Hearing/Vision screen No hearing or vision deficits noted during visit today.  Dietary issues and exercise activities discussed: Current Exercise Habits:: Home exercise routine, Type of exercise: walking;Other - see comments (exercise machine and works in the yard), Time (Minutes): 15, Frequency (Times/Week): 4, Weekly Exercise (Minutes/Week): 60, Intensity: Mild  Goals    . Exercise 3x per week (30 min per time)      Depression Screen PHQ 2/9 Scores 10/13/2014 07/19/2014 03/08/2014 10/08/2013  PHQ - 2 Score 0 0 0 0    Fall Risk Fall Risk  10/13/2014 07/19/2014 03/08/2014 10/08/2013  Falls in the past year? No No No No    Cognitive Function: MMSE - Mini Mental State Exam 10/13/2014  Orientation to time 5  Orientation to Place 5  Registration 3  Attention/ Calculation 5  Recall 3  Language- name 2 objects 2  Language- repeat 1  Language- follow 3 step command 3    Language- read & follow direction 1  Write a sentence 1  Copy design 1  Total score 30    Screening Tests Health Maintenance  Topic Date Due  . PNA vac Low Risk Adult (2 of 2 - PPSV23) 07/08/2014  . OPHTHALMOLOGY EXAM  09/06/2014  . HEMOGLOBIN A1C  01/02/2015  . INFLUENZA VACCINE  01/30/2015  . URINE MICROALBUMIN  03/09/2015  . FOOT EXAM  07/20/2015  . COLONOSCOPY  07/01/2020  . TETANUS/TDAP  03/31/2021  . ZOSTAVAX  Completed        Plan:   Keep appointment with Dr Laurance Flatten on 11/24/14.  During the course of the visit Otis was educated and counseled about the following appropriate screening and preventive services:   Vaccines to include Pneumoccal-Up to date, Influenza-yearly, TD-Up to date, Zostavax-complete  Electrocardiogram-2014. Followed by Dr Percival Spanish  Colorectal cancer screening-Colonoscopy done 05/2012 and FOBT up to date.   Cardiovascular disease screening-Followed by Dr Percival Spanish  Glaucoma screening-Advised that this is recommended yearly. [facility] copy of [facility] report to our office.   Nutrition counseling-Balanced diet with lean proteins, fruits, and vegetables.   Prostate cancer screening-Due at exam with Dr Laurance Flatten on 11/24/14  Exercise- Exercise at least 30 minutes 3 times per week.   Patient Instructions (the written plan) were given to the patient.   Chong Sicilian, RN   10/13/2014                          I have reviewed and agree with the above AWV documentation.  [name], M.D.

## 2014-11-24 ENCOUNTER — Encounter: Payer: Self-pay | Admitting: Family Medicine

## 2014-11-24 ENCOUNTER — Ambulatory Visit (INDEPENDENT_AMBULATORY_CARE_PROVIDER_SITE_OTHER): Payer: Medicare Other | Admitting: Family Medicine

## 2014-11-24 VITALS — BP 119/76 | HR 59 | Temp 97.3°F | Ht 71.0 in | Wt 250.0 lb

## 2014-11-24 DIAGNOSIS — E559 Vitamin D deficiency, unspecified: Secondary | ICD-10-CM

## 2014-11-24 DIAGNOSIS — I10 Essential (primary) hypertension: Secondary | ICD-10-CM

## 2014-11-24 DIAGNOSIS — N400 Enlarged prostate without lower urinary tract symptoms: Secondary | ICD-10-CM

## 2014-11-24 DIAGNOSIS — Z8639 Personal history of other endocrine, nutritional and metabolic disease: Secondary | ICD-10-CM | POA: Diagnosis not present

## 2014-11-24 DIAGNOSIS — Z0000 Encounter for general adult medical examination without abnormal findings: Secondary | ICD-10-CM | POA: Diagnosis not present

## 2014-11-24 DIAGNOSIS — E118 Type 2 diabetes mellitus with unspecified complications: Secondary | ICD-10-CM | POA: Diagnosis not present

## 2014-11-24 LAB — POCT URINALYSIS DIPSTICK
BILIRUBIN UA: NEGATIVE
Blood, UA: NEGATIVE
GLUCOSE UA: NEGATIVE
KETONES UA: NEGATIVE
LEUKOCYTES UA: NEGATIVE
Nitrite, UA: NEGATIVE
PH UA: 5
SPEC GRAV UA: 1.025
Urobilinogen, UA: NEGATIVE

## 2014-11-24 LAB — POCT CBC
Granulocyte percent: 62.2 %G (ref 37–80)
HEMATOCRIT: 44 % (ref 43.5–53.7)
HEMOGLOBIN: 14 g/dL — AB (ref 14.1–18.1)
LYMPH, POC: 1.8 (ref 0.6–3.4)
MCH, POC: 29.6 pg (ref 27–31.2)
MCHC: 31.7 g/dL — AB (ref 31.8–35.4)
MCV: 93.2 fL (ref 80–97)
MPV: 8.7 fL (ref 0–99.8)
POC Granulocyte: 3.5 (ref 2–6.9)
POC LYMPH PERCENT: 32.4 %L (ref 10–50)
Platelet Count, POC: 213 10*3/uL (ref 142–424)
RBC: 4.72 M/uL (ref 4.69–6.13)
RDW, POC: 14.2 %
WBC: 5.7 10*3/uL (ref 4.6–10.2)

## 2014-11-24 LAB — POCT UA - MICROSCOPIC ONLY
Casts, Ur, LPF, POC: NEGATIVE
Crystals, Ur, HPF, POC: NEGATIVE
RBC, URINE, MICROSCOPIC: NEGATIVE
WBC, UR, HPF, POC: NEGATIVE
Yeast, UA: NEGATIVE

## 2014-11-24 LAB — POCT GLYCOSYLATED HEMOGLOBIN (HGB A1C): Hemoglobin A1C: 5.5

## 2014-11-24 NOTE — Addendum Note (Signed)
Addended by: Pollyann Kennedy F on: 11/24/2014 12:07 PM   Modules accepted: Orders

## 2014-11-24 NOTE — Progress Notes (Signed)
Subjective:    Patient ID: Cristian Moore, male    DOB: 05-29-1941, 74 y.o.   MRN: [id number]  HPI Patient is here today for annual wellness exam and follow up of chronic medical problems which includes hypertension and diabetes. He is taking medications regularly. The patient comes in today with minimal complaints. He is due today for a rectal exam and PSA as part of his lab work. He will also get a urinalysis and we will do a diabetic foot check. He is requesting a refill on his nitroglycerin. The patient has had some recent problems with his teeth in his head have a root canal and further surgery. He is still being followed by the neurosurgeon for this. He also had a recent bout with thyroiditis and was seeing an endocrinologist and he has a follow-up visit for this also. He is doing better with this and there is less neck soreness than when he first went to the endocrinologist. He indicates his blood sugars at home and been running anywhere from 95 of 220. He has seen the eye doctor in the past year. He has occasional sex relations but is not really interested in this and does not want to take any medication for this. He denies chest pain shortness of breath GI symptoms or trouble voiding.      Patient Active Problem List   Diagnosis Date Noted  . Insomnia 07/19/2014  . Vitamin D deficiency 07/19/2014  . BPH (benign prostatic hyperplasia) 07/19/2014  . DM (diabetes mellitus), type 2 with complications 03/50/0938  . Acid reflux 06/03/2013  . 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 Personal history of other diseases of circulatory system 06/03/2013  . Loss of peripheral visual field 05/18/2013  . Non-Obstructive CAD 03/05/2013  . Essential hypertension 02/22/2013  . Shortness of breath   . Obstructive sleep apnea   . Diverticulitis, recurrent, s/p lap sigmoid colectomy 07/23/2012 06/02/2012  . CAROTID ARTERY DISEASE 09/28/2009  . Obesity (BMI 30-39.9) 08/02/2009  . DYSLIPIDEMIA 08/01/2009     Outpatient Encounter Prescriptions as of 11/24/2014  Medication Sig  . aspirin 81 MG tablet Take 1 tablet (81 mg total) by mouth daily.  . cholecalciferol (VITAMIN D) 1000 UNITS tablet Take 2,000 Units by mouth daily.   . Coenzyme Q10 (CO Q-10) 100 MG CAPS Take 100 mg by mouth daily.  . fluticasone (FLONASE) 50 MCG/ACT nasal spray Place 2 sprays into both nostrils daily.  Marland Kitchen glucose blood (ONETOUCH VERIO) test strip Check BS BID and PRN. Dx.250.0  . ketorolac (ACULAR) 0.5 % ophthalmic solution Place 1 drop into both eyes 4 (four) times daily as needed.  . meloxicam (MOBIC) 7.5 MG tablet Take 1 tablet (7.5 mg total) by mouth daily.  . metFORMIN (GLUCOPHAGE-XR) 500 MG 24 hr tablet Take 1 tablet by mouth  daily with breakfast  . metoprolol (LOPRESSOR) 50 MG tablet Take one-half tablet by  mouth two times daily  . Multiple Vitamins-Minerals (CENTRUM SILVER PO) Take 1 tablet by mouth daily.  . nitroGLYCERIN (NITROSTAT) 0.4 MG SL tablet Place 1 tablet (0.4 mg total) under the tongue every 5 (five) minutes as needed. Chest pains  . omeprazole (PRILOSEC) 20 MG capsule Take 1 capsule (20 mg total) by mouth 2 (two) times daily before a meal. EVERY AM BEFORE BREAKFAST  . ONETOUCH DELICA LANCETS 18E MISC Check BS BID and PRN. DX. E11.8  . simvastatin (ZOCOR) 20 MG tablet Take 1 tablet (20 mg total) by mouth at bedtime.  . sodium chloride (  OCEAN) 0.65 % nasal spray Place 1 spray into the nose as needed for congestion.  . sucralfate (CARAFATE) 1 G tablet Take 1 tablet (1 g total) by mouth 2 (two) times daily.  . tamsulosin (FLOMAX) 0.4 MG CAPS capsule Take 1 capsule (0.4 mg total) by mouth daily.  Marland Kitchen zolpidem (AMBIEN) 10 MG tablet TAKE ONE TABLET AT BEDTIME   No facility-administered encounter medications on file as of 11/24/2014.      Review of Systems  Constitutional: Negative.   HENT: Negative.   Eyes: Negative.   Respiratory: Negative.   Cardiovascular: Negative.   Gastrointestinal: Negative.    Endocrine: Negative.   Genitourinary: Negative.   Musculoskeletal: Negative.   Skin: Negative.   Allergic/Immunologic: Negative.   Neurological: Negative.   Hematological: Negative.   Psychiatric/Behavioral: Negative.        Objective:   Physical Exam  Constitutional: He is oriented to person, place, and time. He appears well-developed and well-nourished.  Pleasant relaxed and alert  HENT:  Head: Normocephalic and atraumatic.  Right Ear: External ear normal.  Left Ear: External ear normal.  Nose: Nose normal.  Mouth/Throat: Oropharynx is clear and moist. No oropharyngeal exudate.  Eyes: Conjunctivae and EOM are normal. Pupils are equal, round, and reactive to light. Right eye exhibits no discharge. Left eye exhibits no discharge. No scleral icterus.  Neck: Normal range of motion. Neck supple. No thyromegaly present.  Without bruits or thyromegaly-there was no thyroid tenderness today or  Cardiovascular: Normal rate, regular rhythm, normal heart sounds and intact distal pulses.  Exam reveals no gallop and no friction rub.   No murmur heard. Heart has a regular rate and rhythm at 72/m  Pulmonary/Chest: Effort normal and breath sounds normal. No respiratory distress. He has no wheezes. He has no rales. He exhibits no tenderness.  Abdominal: Soft. Bowel sounds are normal. He exhibits no mass. There is no tenderness. There is no rebound and no guarding.  There is obesity without masses tenderness or organ enlargement  Genitourinary: Rectum normal and penis normal.  The prostate was slightly enlarged without lumps or masses. Rectal exam was negative for masses. The external genitalia were normal and there were no inguinal hernias palpable.  Musculoskeletal: Normal range of motion. He exhibits no edema or tenderness.  Lymphadenopathy:    He has no cervical adenopathy.  Neurological: He is alert and oriented to person, place, and time. He has normal reflexes. No cranial nerve deficit.   Skin: Skin is warm and dry. No rash noted. No erythema. No pallor.  Psychiatric: He has a normal mood and affect. His behavior is normal. Judgment and thought content normal.  Nursing note and vitals reviewed.  BP 119/76 mmHg  Pulse 59  Temp(Src) 97.3 F (36.3 C) (Oral)  Ht 5\' 11"  (1.803 m)  Wt 250 lb (113.399 kg)  BMI 34.88 kg/m2        Assessment & Plan:  1. DM (diabetes mellitus), type 2 with complications - - POCT CBC - NMR, lipoprofile - POCT glycosylated hemoglobin (Hb A1C)  2. Essential hypertension - - POCT CBC - BMP8+EGFR - Hepatic function panel - NMR, lipoprofile  3. Vitamin D deficiency - - POCT CBC - Vit D  25 hydroxy (rtn osteoporosis monitoring)  4. BPH (benign prostatic hyperplasia) - - POCT CBC - PSA, total and free - POCT urine pregnancy - POCT urinalysis dipstick  5. History of thyroiditis He is to follow-up with the endocrinologist and this problem is improved POCT CBC  6. Annual physical exam -He needs to return the FOBT - POCT CBC - BMP8+EGFR - Hepatic function panel - NMR, lipoprofile - Vit D  25 hydroxy (rtn osteoporosis monitoring) - POCT glycosylated hemoglobin (Hb A1C) - PSA, total and free - POCT urine pregnancy - POCT urinalysis dipstick  No orders of the defined types were placed in this encounter.   Patient Instructions                       Medicare Annual Wellness Visit  [facility] [facility] and the medical providers at [facility] strive to bring you the best medical care.  In doing so we not only want to address your current medical conditions and concerns but also to detect new conditions early and prevent illness, disease and health-related problems.    Medicare offers a yearly Wellness Visit which allows our clinical staff to assess your need for preventative services including immunizations, lifestyle education, counseling to decrease risk of preventable diseases and screening for fall risk and  other medical concerns.    This visit is provided free of charge (no copay) for all Medicare recipients. The clinical pharmacists at [facility] have begun to conduct these Wellness Visits which will also include a thorough review of all your medications.    As you primary medical provider recommend that you make an appointment for your Annual Wellness Visit if you have not done so already this year.  You may set up this appointment before you leave today or you may call back ([phone]) and schedule an appointment.  Please make sure when you call that you mention that you are scheduling your Annual Wellness Visit with the clinical pharmacist so that the appointment may be made for the proper length of time.     Continue current medications. Continue good therapeutic lifestyle changes which include good diet and exercise. Fall precautions discussed with patient. If an FOBT was given today- please return it to our front desk. If you are over 37 years old - you may need Prevnar 29 or the adult Pneumonia vaccine.  Flu Shots are still available at our office. If you still haven't had one please call to set up a nurse visit to get one.   After your visit with [place] today you will receive a survey in the mail or online from [employer] regarding your care with [place]. Please take a moment to fill this out. Your feedback is very important to [place] as you can help [place] better understand your patient needs as well as improve your experience and satisfaction. WE CARE ABOUT YOU!!!   follow-up with oral surgeon and endocrinologist as planned Continue to work aggressively on weight loss and diet habits  monitor blood sugars regularly   Arrie Senate MD

## 2014-11-24 NOTE — Addendum Note (Signed)
Addended by: Pollyann Kennedy F on: 11/24/2014 12:02 PM   Modules accepted: Orders

## 2014-11-24 NOTE — Patient Instructions (Addendum)
Medicare Annual Wellness Visit  [facility] [facility] and the medical providers at [facility] strive to bring you the best medical care.  In doing so we not only want to address your current medical conditions and concerns but also to detect new conditions early and prevent illness, disease and health-related problems.    Medicare offers a yearly Wellness Visit which allows our clinical staff to assess your need for preventative services including immunizations, lifestyle education, counseling to decrease risk of preventable diseases and screening for fall risk and other medical concerns.    This visit is provided free of charge (no copay) for all Medicare recipients. The clinical pharmacists at [facility] have begun to conduct these Wellness Visits which will also include a thorough review of all your medications.    As you primary medical provider recommend that you make an appointment for your Annual Wellness Visit if you have not done so already this year.  You may set up this appointment before you leave today or you may call back ([phone]) and schedule an appointment.  Please make sure when you call that you mention that you are scheduling your Annual Wellness Visit with the clinical pharmacist so that the appointment may be made for the proper length of time.     Continue current medications. Continue good therapeutic lifestyle changes which include good diet and exercise. Fall precautions discussed with patient. If an FOBT was given today- please return it to our front desk. If you are over 45 years old - you may need Prevnar 23 or the adult Pneumonia vaccine.  Flu Shots are still available at our office. If you still haven't had one please call to set up a nurse visit to get one.   After your visit with [place] today you will receive a survey in the mail or online from [employer] regarding your care with [place]. Please take a moment to  fill this out. Your feedback is very important to [place] as you can help [place] better understand your patient needs as well as improve your experience and satisfaction. WE CARE ABOUT YOU!!!   follow-up with oral surgeon and endocrinologist as planned Continue to work aggressively on weight loss and diet habits  monitor blood sugars regularly

## 2014-11-24 NOTE — Addendum Note (Signed)
Addended by: Pollyann Kennedy F on: 11/24/2014 12:01 PM   Modules accepted: Orders

## 2014-11-25 LAB — BMP8+EGFR
BUN / CREAT RATIO: 19 (ref 10–22)
BUN: 17 mg/dL (ref 8–27)
CHLORIDE: 101 mmol/L (ref 97–108)
CO2: 25 mmol/L (ref 18–29)
CREATININE: 0.91 mg/dL (ref 0.76–1.27)
Calcium: 9.6 mg/dL (ref 8.6–10.2)
GFR calc Af Amer: 96 mL/min/{1.73_m2} (ref >59)
GFR calc non Af Amer: 83 mL/min/{1.73_m2} (ref >59)
GLUCOSE: 116 mg/dL — AB (ref 65–99)
Potassium: 4.7 mmol/L (ref 3.5–5.2)
SODIUM: 141 mmol/L (ref 134–144)

## 2014-11-25 LAB — NMR, LIPOPROFILE
CHOLESTEROL: 137 mg/dL (ref 100–199)
HDL Cholesterol by NMR: 45 mg/dL (ref >39)
HDL Particle Number: 32.4 umol/L (ref >=30.5)
LDL PARTICLE NUMBER: 979 nmol/L (ref <1000)
LDL Size: 20.6 nm (ref >20.5)
LDL-C: 72 mg/dL (ref 0–99)
LP-IR Score: 67 — ABNORMAL HIGH (ref <=45)
SMALL LDL PARTICLE NUMBER: 418 nmol/L (ref <=527)
TRIGLYCERIDES BY NMR: 101 mg/dL (ref 0–149)

## 2014-11-25 LAB — HEPATIC FUNCTION PANEL
ALK PHOS: 48 IU/L (ref 39–117)
ALT: 16 IU/L (ref 0–44)
AST: 22 IU/L (ref 0–40)
Albumin: 4.4 g/dL (ref 3.5–4.8)
BILIRUBIN TOTAL: 0.6 mg/dL (ref 0.0–1.2)
Bilirubin, Direct: 0.17 mg/dL (ref 0.00–0.40)
TOTAL PROTEIN: 6.4 g/dL (ref 6.0–8.5)

## 2014-11-25 LAB — VITAMIN D 25 HYDROXY (VIT D DEFICIENCY, FRACTURES): Vit D, 25-Hydroxy: 41.4 ng/mL (ref 30.0–100.0)

## 2014-11-30 ENCOUNTER — Ambulatory Visit: Payer: Medicare Other | Admitting: Family Medicine

## 2014-12-08 ENCOUNTER — Other Ambulatory Visit: Payer: Self-pay | Admitting: Nurse Practitioner

## 2014-12-12 ENCOUNTER — Other Ambulatory Visit: Payer: Self-pay | Admitting: Family Medicine

## 2014-12-19 ENCOUNTER — Other Ambulatory Visit (INDEPENDENT_AMBULATORY_CARE_PROVIDER_SITE_OTHER): Payer: Medicare Other

## 2014-12-19 DIAGNOSIS — E041 Nontoxic single thyroid nodule: Secondary | ICD-10-CM | POA: Diagnosis not present

## 2014-12-19 NOTE — Progress Notes (Signed)
Labs Dr Wilson Singer Thyroid panel E04.1

## 2014-12-20 LAB — THYROID PANEL WITH TSH
Free Thyroxine Index: 1.5 (ref 1.2–4.9)
T3 UPTAKE RATIO: 28 % (ref 24–39)
T4, Total: 5.2 ug/dL (ref 4.5–12.0)
TSH: 1.3 u[IU]/mL (ref 0.450–4.500)

## 2014-12-21 ENCOUNTER — Other Ambulatory Visit: Payer: Self-pay | Admitting: Family Medicine

## 2014-12-21 DIAGNOSIS — L821 Other seborrheic keratosis: Secondary | ICD-10-CM | POA: Diagnosis not present

## 2014-12-21 DIAGNOSIS — L820 Inflamed seborrheic keratosis: Secondary | ICD-10-CM | POA: Diagnosis not present

## 2014-12-21 DIAGNOSIS — L570 Actinic keratosis: Secondary | ICD-10-CM | POA: Diagnosis not present

## 2014-12-21 DIAGNOSIS — D1801 Hemangioma of skin and subcutaneous tissue: Secondary | ICD-10-CM | POA: Diagnosis not present

## 2014-12-21 DIAGNOSIS — D225 Melanocytic nevi of trunk: Secondary | ICD-10-CM | POA: Diagnosis not present

## 2014-12-21 DIAGNOSIS — L919 Hypertrophic disorder of the skin, unspecified: Secondary | ICD-10-CM | POA: Diagnosis not present

## 2014-12-21 NOTE — Telephone Encounter (Signed)
Last seen 11/24/14  DWM  If approved route to nurse to call into [facility]

## 2014-12-26 DIAGNOSIS — E049 Nontoxic goiter, unspecified: Secondary | ICD-10-CM | POA: Diagnosis not present

## 2015-01-05 DIAGNOSIS — M5136 Other intervertebral disc degeneration, lumbar region: Secondary | ICD-10-CM | POA: Diagnosis not present

## 2015-01-12 DIAGNOSIS — M5136 Other intervertebral disc degeneration, lumbar region: Secondary | ICD-10-CM | POA: Diagnosis not present

## 2015-01-19 ENCOUNTER — Encounter: Payer: Self-pay | Admitting: *Deleted

## 2015-01-19 DIAGNOSIS — M5136 Other intervertebral disc degeneration, lumbar region: Secondary | ICD-10-CM | POA: Diagnosis not present

## 2015-01-19 DIAGNOSIS — M5416 Radiculopathy, lumbar region: Secondary | ICD-10-CM | POA: Diagnosis not present

## 2015-01-27 ENCOUNTER — Encounter: Payer: Self-pay | Admitting: Nurse Practitioner

## 2015-01-27 ENCOUNTER — Ambulatory Visit (INDEPENDENT_AMBULATORY_CARE_PROVIDER_SITE_OTHER): Payer: Medicare Other

## 2015-01-27 ENCOUNTER — Ambulatory Visit (INDEPENDENT_AMBULATORY_CARE_PROVIDER_SITE_OTHER): Payer: Medicare Other | Admitting: Nurse Practitioner

## 2015-01-27 VITALS — BP 117/70 | HR 70 | Temp 97.3°F | Ht 71.0 in | Wt 260.0 lb

## 2015-01-27 DIAGNOSIS — K5900 Constipation, unspecified: Secondary | ICD-10-CM

## 2015-01-27 DIAGNOSIS — R1031 Right lower quadrant pain: Secondary | ICD-10-CM

## 2015-01-27 NOTE — Progress Notes (Signed)
   Subjective:    Patient ID: Cristian Moore, male    DOB: 08-18-1940, 74 y.o.   MRN: [id number]  HPI Patient in today c/o rlq pain- started about 1 week ago - today it has been sharp shooting pains- positive for constipation and took a dulcolax- denies fever, nauses or vomiting  * Hx of colectomy from diverticulitis.  Review of Systems  Constitutional: Negative.   HENT: Negative.   Respiratory: Negative.   Cardiovascular: Negative.   Gastrointestinal: Negative.   Genitourinary: Negative.   Musculoskeletal: Negative.   Neurological: Negative.   Psychiatric/Behavioral: Negative.   All other systems reviewed and are negative.      Objective:   Physical Exam  Constitutional: He is oriented to person, place, and time. He appears well-developed and well-nourished.  Cardiovascular: Normal rate, regular rhythm and normal heart sounds.   Pulmonary/Chest: Effort normal and breath sounds normal.  Abdominal: Soft. Bowel sounds are normal. He exhibits no mass. There is tenderness (RLQ pain). There is no rebound and no guarding.  Neurological: He is alert and oriented to person, place, and time.  Skin: Skin is warm and dry.  Psychiatric: He has a normal mood and affect. His behavior is normal. Judgment and thought content normal.    BP 117/70 mmHg  Pulse 70  Temp(Src) 97.3 F (36.3 C) (Oral)  Ht 5\' 11"  (1.803 m)  Wt 260 lb (117.935 kg)  BMI 36.28 kg/m2  KUB- moderate amount of stool burden- Preliminary reading by Ronnald Collum, FNP  [facility]       Assessment & Plan:   1. RLQ abdominal pain   2. Constipation, unspecified constipation type    Force fluids Increase fiber in diet Milk of magnesia nd prune juice Ir pain continues this weekend to ER for CT scan  Mary-Margaret Hassell Done, FNP

## 2015-01-27 NOTE — Patient Instructions (Signed)

## 2015-02-14 DIAGNOSIS — M5136 Other intervertebral disc degeneration, lumbar region: Secondary | ICD-10-CM | POA: Diagnosis not present

## 2015-02-14 DIAGNOSIS — M5416 Radiculopathy, lumbar region: Secondary | ICD-10-CM | POA: Diagnosis not present

## 2015-02-21 DIAGNOSIS — M5416 Radiculopathy, lumbar region: Secondary | ICD-10-CM | POA: Diagnosis not present

## 2015-03-07 DIAGNOSIS — M5416 Radiculopathy, lumbar region: Secondary | ICD-10-CM | POA: Diagnosis not present

## 2015-03-07 DIAGNOSIS — M5136 Other intervertebral disc degeneration, lumbar region: Secondary | ICD-10-CM | POA: Diagnosis not present

## 2015-03-10 ENCOUNTER — Ambulatory Visit (INDEPENDENT_AMBULATORY_CARE_PROVIDER_SITE_OTHER): Payer: Medicare Other | Admitting: Family Medicine

## 2015-03-10 ENCOUNTER — Ambulatory Visit (INDEPENDENT_AMBULATORY_CARE_PROVIDER_SITE_OTHER): Payer: Medicare Other

## 2015-03-10 ENCOUNTER — Encounter: Payer: Self-pay | Admitting: Family Medicine

## 2015-03-10 VITALS — BP 126/77 | HR 66 | Temp 98.1°F | Ht 71.0 in | Wt 260.8 lb

## 2015-03-10 DIAGNOSIS — M25522 Pain in left elbow: Secondary | ICD-10-CM | POA: Diagnosis not present

## 2015-03-10 NOTE — Progress Notes (Signed)
   HPI  Patient presents today for L arm pain  He explains about 1 week ago he hit his L elbow on the door frame as he walked through it. He had immediate pain and "funny bone" sensation and then it improved. However he has had continue tingling typ epain of L Pinki extending to his medial L axilla.   He has no loss of strength. His pain is mild to moderate. He has full use of his L hand and arm.   He had swelling initially and tenderness at the are of his Medial elbow initially but now states that it is resolved.   Mild dyspnea or anbout 1 year with exercise, worse out in the heat. He wonders if metformin could cause this.   PMH: Smoking status noted ROS: Per HPI, otherwise negative.   Objective: BP 126/77 mmHg  Pulse 66  Temp(Src) 98.1 F (36.7 C) (Oral)  Ht 5\' 11"  (1.803 m)  Wt 260 lb 12.8 oz (118.298 kg)  BMI 36.39 kg/m2 Gen: NAD, alert, cooperative with exam HEENT: NCAT CV: RRR, good S1/S2, no murmur Resp: CTABL, no wheezes, non-labored Abd: SNTND, BS present, no guarding or organomegaly Ext: No swelling, redness, or tenderness of the L elbow, no lesions.  Neuro: Alert and oriented, strength 5/5 in L grip and LUE.   DG Elbow- No acute fracture or lesion.   Assessment and plan:  # Elbow pain, ulnar neuropathy Reassurance for now without any loss of function Consider gabapentin if continues, also consider EMG LUE or neuro referral if no improvement Could consider steroid burst but would be careful with diabetes  # Dyspnea Possibly related to metformin (1-5% incidence), but unlikely Pt wants to continue and see if it is really heat related dyspnea as symptoms are prominent outside.  Unlikely to be CV etiology, no orthopnea or swelling.    Orders Placed This Encounter  Procedures  . DG Elbow 2 Views Left    Standing Status: Future     Number of Occurrences: 1     Standing Expiration Date: 05/09/2016    Order Specific Question:  Reason for Exam (SYMPTOM  OR  DIAGNOSIS REQUIRED)    Answer:  left elbow pain    Order Specific Question:  Preferred imaging location?    Answer:  Internal    Laroy Apple, MD [facility] 03/10/2015, 12:12 PM

## 2015-03-14 ENCOUNTER — Other Ambulatory Visit: Payer: Medicare Other

## 2015-03-17 ENCOUNTER — Other Ambulatory Visit: Payer: Self-pay | Admitting: Family Medicine

## 2015-03-18 ENCOUNTER — Observation Stay (HOSPITAL_COMMUNITY)
Admission: EM | Admit: 2015-03-18 | Discharge: 2015-03-20 | Disposition: A | Discharge status: Home / Self Care | Payer: Medicare Other | Attending: Internal Medicine | Admitting: Internal Medicine

## 2015-03-18 ENCOUNTER — Encounter (HOSPITAL_COMMUNITY): Payer: Self-pay

## 2015-03-18 ENCOUNTER — Emergency Department (HOSPITAL_COMMUNITY): Payer: Medicare Other

## 2015-03-18 DIAGNOSIS — Z87891 Personal history of nicotine dependence: Secondary | ICD-10-CM | POA: Insufficient documentation

## 2015-03-18 DIAGNOSIS — M1990 Unspecified osteoarthritis, unspecified site: Secondary | ICD-10-CM | POA: Insufficient documentation

## 2015-03-18 DIAGNOSIS — E119 Type 2 diabetes mellitus without complications: Secondary | ICD-10-CM | POA: Insufficient documentation

## 2015-03-18 DIAGNOSIS — Z880 Allergy status to penicillin: Secondary | ICD-10-CM | POA: Diagnosis not present

## 2015-03-18 DIAGNOSIS — K5792 Diverticulitis of intestine, part unspecified, without perforation or abscess without bleeding: Secondary | ICD-10-CM | POA: Insufficient documentation

## 2015-03-18 DIAGNOSIS — R079 Chest pain, unspecified: Principal | ICD-10-CM | POA: Diagnosis present

## 2015-03-18 DIAGNOSIS — Z79899 Other long term (current) drug therapy: Secondary | ICD-10-CM | POA: Insufficient documentation

## 2015-03-18 DIAGNOSIS — G4733 Obstructive sleep apnea (adult) (pediatric): Secondary | ICD-10-CM | POA: Diagnosis not present

## 2015-03-18 DIAGNOSIS — K469 Unspecified abdominal hernia without obstruction or gangrene: Secondary | ICD-10-CM | POA: Diagnosis not present

## 2015-03-18 DIAGNOSIS — E669 Obesity, unspecified: Secondary | ICD-10-CM | POA: Diagnosis present

## 2015-03-18 DIAGNOSIS — Z7982 Long term (current) use of aspirin: Secondary | ICD-10-CM | POA: Insufficient documentation

## 2015-03-18 DIAGNOSIS — K625 Hemorrhage of anus and rectum: Secondary | ICD-10-CM | POA: Diagnosis not present

## 2015-03-18 DIAGNOSIS — N400 Enlarged prostate without lower urinary tract symptoms: Secondary | ICD-10-CM | POA: Diagnosis present

## 2015-03-18 DIAGNOSIS — R0602 Shortness of breath: Secondary | ICD-10-CM | POA: Diagnosis present

## 2015-03-18 DIAGNOSIS — R479 Unspecified speech disturbances: Secondary | ICD-10-CM

## 2015-03-18 DIAGNOSIS — I6529 Occlusion and stenosis of unspecified carotid artery: Secondary | ICD-10-CM | POA: Diagnosis not present

## 2015-03-18 DIAGNOSIS — E785 Hyperlipidemia, unspecified: Secondary | ICD-10-CM | POA: Diagnosis not present

## 2015-03-18 DIAGNOSIS — K219 Gastro-esophageal reflux disease without esophagitis: Secondary | ICD-10-CM | POA: Diagnosis not present

## 2015-03-18 DIAGNOSIS — R0789 Other chest pain: Secondary | ICD-10-CM | POA: Diagnosis not present

## 2015-03-18 DIAGNOSIS — R200 Anesthesia of skin: Secondary | ICD-10-CM

## 2015-03-18 DIAGNOSIS — I10 Essential (primary) hypertension: Secondary | ICD-10-CM | POA: Diagnosis present

## 2015-03-18 DIAGNOSIS — K5790 Diverticulosis of intestine, part unspecified, without perforation or abscess without bleeding: Secondary | ICD-10-CM | POA: Diagnosis not present

## 2015-03-18 DIAGNOSIS — E118 Type 2 diabetes mellitus with unspecified complications: Secondary | ICD-10-CM | POA: Diagnosis present

## 2015-03-18 DIAGNOSIS — I2510 Atherosclerotic heart disease of native coronary artery without angina pectoris: Secondary | ICD-10-CM | POA: Diagnosis present

## 2015-03-18 LAB — I-STAT TROPONIN, ED: TROPONIN I, POC: 0.01 ng/mL (ref 0.00–0.08)

## 2015-03-18 LAB — CBC
HEMATOCRIT: 43.1 % (ref 39.0–52.0)
Hemoglobin: 14.7 g/dL (ref 13.0–17.0)
MCH: 31.5 pg (ref 26.0–34.0)
MCHC: 34.1 g/dL (ref 30.0–36.0)
MCV: 92.5 fL (ref 78.0–100.0)
Platelets: 232 10*3/uL (ref 150–400)
RBC: 4.66 MIL/uL (ref 4.22–5.81)
RDW: 13.3 % (ref 11.5–15.5)
WBC: 8.3 10*3/uL (ref 4.0–10.5)

## 2015-03-18 LAB — BASIC METABOLIC PANEL
Anion gap: 10 (ref 5–15)
BUN: 13 mg/dL (ref 6–20)
CHLORIDE: 100 mmol/L — AB (ref 101–111)
CO2: 23 mmol/L (ref 22–32)
Calcium: 9.6 mg/dL (ref 8.9–10.3)
Creatinine, Ser: 1.05 mg/dL (ref 0.61–1.24)
Glucose, Bld: 118 mg/dL — ABNORMAL HIGH (ref 65–99)
POTASSIUM: 3.6 mmol/L (ref 3.5–5.1)
SODIUM: 133 mmol/L — AB (ref 135–145)

## 2015-03-18 MED ORDER — ASPIRIN 81 MG PO CHEW
324.0000 mg | CHEWABLE_TABLET | Freq: Once | ORAL | Status: DC
  Filled 2015-03-18: qty 4

## 2015-03-18 MED ORDER — NITROGLYCERIN 0.4 MG SL SUBL: 0.4000 mg | SUBLINGUAL_TABLET | SUBLINGUAL | Status: DC | PRN

## 2015-03-18 NOTE — ED Provider Notes (Signed)
The patient is a 74 year old male, presents with a complaint of left-sided chest discomfort with dyspnea. He has increased pain occasionally with taking a deep breath but it is there when he is not taking a deep breath as well. He states it feels like a throbbing. He has difficulty getting a deep breath and feels like he is not getting enough oxygen. He has a history of prior cardiac catheterization, no stents, no known blockages but has had several episodes of supraventricular tachycardia in the past. On exam he has clear heart and lung sounds, no rales wheezing or rhonchi, no murmurs rubs or gallops, no peripheral edema and no JVD. EKG is normal, we'll obtain chest x-ray and labs, discussed with cardiology.  Medical screening examination/treatment/procedure(s) were conducted as a shared visit with non-physician practitioner(s) and myself.  I personally evaluated the patient during the encounter.  Clinical Impression:   Final diagnoses:  Chest pain, unspecified chest pain type     EKG Interpretation  Date/Time:  Saturday March 18 2015 21:28:04 EDT Ventricular Rate:  81 PR Interval:  178 QRS Duration: 86 QT Interval:  384 QTC Calculation: 446 R Axis:   45 Text Interpretation:  Normal sinus rhythm Normal ECG since last tracing no significant change Confirmed by Sabra Heck  MD, [place] ([id number]) on 03/18/2015 11:16:55 PM        Noemi Chapel, MD 03/19/15 [id number]

## 2015-03-18 NOTE — ED Notes (Addendum)
Onset yesterday left chest tightness radiating to left scapula and shortness of breath.  Pain is constant and not worsening.

## 2015-03-18 NOTE — ED Notes (Signed)
Patient coming from xray

## 2015-03-19 ENCOUNTER — Inpatient Hospital Stay (HOSPITAL_COMMUNITY): Payer: Medicare Other

## 2015-03-19 ENCOUNTER — Encounter (HOSPITAL_COMMUNITY): Payer: Self-pay | Admitting: Radiology

## 2015-03-19 ENCOUNTER — Inpatient Hospital Stay (HOSPITAL_BASED_OUTPATIENT_CLINIC_OR_DEPARTMENT_OTHER): Payer: Medicare Other

## 2015-03-19 DIAGNOSIS — I2510 Atherosclerotic heart disease of native coronary artery without angina pectoris: Secondary | ICD-10-CM | POA: Diagnosis not present

## 2015-03-19 DIAGNOSIS — K219 Gastro-esophageal reflux disease without esophagitis: Secondary | ICD-10-CM

## 2015-03-19 DIAGNOSIS — R200 Anesthesia of skin: Secondary | ICD-10-CM

## 2015-03-19 DIAGNOSIS — I6529 Occlusion and stenosis of unspecified carotid artery: Secondary | ICD-10-CM | POA: Diagnosis not present

## 2015-03-19 DIAGNOSIS — I10 Essential (primary) hypertension: Secondary | ICD-10-CM | POA: Diagnosis not present

## 2015-03-19 DIAGNOSIS — R0789 Other chest pain: Secondary | ICD-10-CM

## 2015-03-19 DIAGNOSIS — E785 Hyperlipidemia, unspecified: Secondary | ICD-10-CM | POA: Diagnosis not present

## 2015-03-19 DIAGNOSIS — K5790 Diverticulosis of intestine, part unspecified, without perforation or abscess without bleeding: Secondary | ICD-10-CM | POA: Diagnosis not present

## 2015-03-19 DIAGNOSIS — N400 Enlarged prostate without lower urinary tract symptoms: Secondary | ICD-10-CM | POA: Diagnosis not present

## 2015-03-19 DIAGNOSIS — R079 Chest pain, unspecified: Secondary | ICD-10-CM

## 2015-03-19 DIAGNOSIS — R4789 Other speech disturbances: Secondary | ICD-10-CM | POA: Diagnosis not present

## 2015-03-19 DIAGNOSIS — R0602 Shortness of breath: Secondary | ICD-10-CM

## 2015-03-19 DIAGNOSIS — G4733 Obstructive sleep apnea (adult) (pediatric): Secondary | ICD-10-CM

## 2015-03-19 DIAGNOSIS — R208 Other disturbances of skin sensation: Secondary | ICD-10-CM

## 2015-03-19 LAB — CBC
HEMATOCRIT: 42.2 % (ref 39.0–52.0)
Hemoglobin: 14.6 g/dL (ref 13.0–17.0)
MCH: 32.2 pg (ref 26.0–34.0)
MCHC: 34.6 g/dL (ref 30.0–36.0)
MCV: 93.2 fL (ref 78.0–100.0)
Platelets: 223 10*3/uL (ref 150–400)
RBC: 4.53 MIL/uL (ref 4.22–5.81)
RDW: 13.4 % (ref 11.5–15.5)
WBC: 7.1 10*3/uL (ref 4.0–10.5)

## 2015-03-19 LAB — COMPREHENSIVE METABOLIC PANEL
ALBUMIN: 3.8 g/dL (ref 3.5–5.0)
ALT: 21 U/L (ref 17–63)
AST: 24 U/L (ref 15–41)
Alkaline Phosphatase: 35 U/L — ABNORMAL LOW (ref 38–126)
Anion gap: 9 (ref 5–15)
BUN: 11 mg/dL (ref 6–20)
CHLORIDE: 106 mmol/L (ref 101–111)
CO2: 23 mmol/L (ref 22–32)
Calcium: 9.6 mg/dL (ref 8.9–10.3)
Creatinine, Ser: 0.96 mg/dL (ref 0.61–1.24)
GFR calc Af Amer: >60 mL/min (ref >60)
GFR calc non Af Amer: >60 mL/min (ref >60)
GLUCOSE: 108 mg/dL — AB (ref 65–99)
POTASSIUM: 3.9 mmol/L (ref 3.5–5.1)
Sodium: 138 mmol/L (ref 135–145)
Total Bilirubin: 1 mg/dL (ref 0.3–1.2)
Total Protein: 6.3 g/dL — ABNORMAL LOW (ref 6.5–8.1)

## 2015-03-19 LAB — GLUCOSE, CAPILLARY
GLUCOSE-CAPILLARY: 101 mg/dL — AB (ref 65–99)
GLUCOSE-CAPILLARY: 92 mg/dL (ref 65–99)
Glucose-Capillary: 109 mg/dL — ABNORMAL HIGH (ref 65–99)
Glucose-Capillary: 93 mg/dL (ref 65–99)

## 2015-03-19 LAB — RAPID URINE DRUG SCREEN, HOSP PERFORMED
Amphetamines: NOT DETECTED
BENZODIAZEPINES: NOT DETECTED
Barbiturates: NOT DETECTED
COCAINE: NOT DETECTED
Opiates: NOT DETECTED
Tetrahydrocannabinol: NOT DETECTED

## 2015-03-19 LAB — TROPONIN I: Troponin I: 0.05 ng/mL — ABNORMAL HIGH (ref <0.031)

## 2015-03-19 LAB — PROTIME-INR
INR: 1.06 (ref 0.00–1.49)
Prothrombin Time: 14 seconds (ref 11.6–15.2)

## 2015-03-19 LAB — APTT: APTT: 27 s (ref 24–37)

## 2015-03-19 MED ORDER — ACETAMINOPHEN 650 MG RE SUPP: 650.0000 mg | Freq: Four times a day (QID) | RECTAL | Status: DC | PRN

## 2015-03-19 MED ORDER — SIMVASTATIN 20 MG PO TABS
20.0000 mg | ORAL_TABLET | Freq: Every day | ORAL | Status: DC
  Administered 2015-03-19: 20 mg via ORAL
  Filled 2015-03-19: qty 1

## 2015-03-19 MED ORDER — ADULT MULTIVITAMIN W/MINERALS CH
1.0000 | ORAL_TABLET | Freq: Every day | ORAL | Status: DC
  Administered 2015-03-19 – 2015-03-20 (×2): 1 via ORAL
  Filled 2015-03-19 (×2): qty 1

## 2015-03-19 MED ORDER — SALINE SPRAY 0.65 % NA SOLN
1.0000 | NASAL | Status: DC | PRN
  Filled 2015-03-19: qty 44

## 2015-03-19 MED ORDER — PANTOPRAZOLE SODIUM 40 MG PO TBEC
40.0000 mg | DELAYED_RELEASE_TABLET | Freq: Every day | ORAL | Status: DC
  Administered 2015-03-19 – 2015-03-20 (×2): 40 mg via ORAL
  Filled 2015-03-19 (×2): qty 1

## 2015-03-19 MED ORDER — CO Q-10 100 MG PO CAPS: 100.0000 mg | ORAL_CAPSULE | Freq: Every day | ORAL | Status: DC

## 2015-03-19 MED ORDER — ACETAMINOPHEN 325 MG PO TABS
650.0000 mg | ORAL_TABLET | Freq: Four times a day (QID) | ORAL | Status: DC | PRN
  Administered 2015-03-19 (×2): 650 mg via ORAL
  Filled 2015-03-19 (×2): qty 2

## 2015-03-19 MED ORDER — INSULIN ASPART 100 UNIT/ML ~~LOC~~ SOLN
0.0000 [IU] | Freq: Three times a day (TID) | SUBCUTANEOUS | Status: DC
Start: 2015-03-19 — End: 2015-03-20

## 2015-03-19 MED ORDER — TAMSULOSIN HCL 0.4 MG PO CAPS
0.4000 mg | ORAL_CAPSULE | Freq: Every day | ORAL | Status: DC
  Administered 2015-03-19 – 2015-03-20 (×2): 0.4 mg via ORAL
  Filled 2015-03-19 (×2): qty 1

## 2015-03-19 MED ORDER — METOPROLOL TARTRATE 25 MG PO TABS
25.0000 mg | ORAL_TABLET | Freq: Two times a day (BID) | ORAL | Status: DC
  Administered 2015-03-19 – 2015-03-20 (×4): 25 mg via ORAL
  Filled 2015-03-19 (×4): qty 1

## 2015-03-19 MED ORDER — SODIUM CHLORIDE 0.9 % IJ SOLN
3.0000 mL | Freq: Two times a day (BID) | INTRAMUSCULAR | Status: DC
  Administered 2015-03-19 (×2): 3 mL via INTRAVENOUS

## 2015-03-19 MED ORDER — LORAZEPAM 2 MG/ML IJ SOLN: 1.0000 mg | Freq: Once | INTRAMUSCULAR | Status: DC

## 2015-03-19 MED ORDER — MORPHINE SULFATE (PF) 2 MG/ML IV SOLN: 2.0000 mg | INTRAVENOUS | Status: DC | PRN

## 2015-03-19 MED ORDER — ONDANSETRON HCL 4 MG PO TABS: 4.0000 mg | ORAL_TABLET | Freq: Four times a day (QID) | ORAL | Status: DC | PRN

## 2015-03-19 MED ORDER — ZOLPIDEM TARTRATE 5 MG PO TABS: 10.0000 mg | ORAL_TABLET | Freq: Every evening | ORAL | Status: DC | PRN

## 2015-03-19 MED ORDER — ONDANSETRON HCL 4 MG/2ML IJ SOLN: 4.0000 mg | Freq: Four times a day (QID) | INTRAMUSCULAR | Status: DC | PRN

## 2015-03-19 MED ORDER — ASPIRIN 325 MG PO TABS
325.0000 mg | ORAL_TABLET | Freq: Every day | ORAL | Status: DC
  Administered 2015-03-20: 325 mg via ORAL
  Filled 2015-03-19 (×2): qty 1

## 2015-03-19 MED ORDER — NITROGLYCERIN 0.4 MG SL SUBL: 0.4000 mg | SUBLINGUAL_TABLET | SUBLINGUAL | Status: DC | PRN

## 2015-03-19 MED ORDER — IBUPROFEN-DIPHENHYDRAMINE CIT 200-38 MG PO TABS: 1.0000 | ORAL_TABLET | Freq: Every evening | ORAL | Status: DC | PRN

## 2015-03-19 MED ORDER — SUCRALFATE 1 G PO TABS
1.0000 g | ORAL_TABLET | Freq: Two times a day (BID) | ORAL | Status: DC
  Administered 2015-03-19 – 2015-03-20 (×3): 1 g via ORAL
  Filled 2015-03-19 (×4): qty 1

## 2015-03-19 MED ORDER — ZOLPIDEM TARTRATE 5 MG PO TABS
5.0000 mg | ORAL_TABLET | Freq: Every evening | ORAL | Status: DC | PRN
  Administered 2015-03-19: 5 mg via ORAL
  Filled 2015-03-19: qty 1

## 2015-03-19 MED ORDER — FLUTICASONE PROPIONATE 50 MCG/ACT NA SUSP
2.0000 | Freq: Every day | NASAL | Status: DC
  Administered 2015-03-19: 2 via NASAL
  Filled 2015-03-19: qty 16

## 2015-03-19 MED ORDER — SODIUM CHLORIDE 0.9 % IV SOLN
INTRAVENOUS | Status: DC
  Administered 2015-03-19: 75 mL/h via INTRAVENOUS
  Administered 2015-03-19: 1000 mL via INTRAVENOUS
  Administered 2015-03-20: 06:00:00 via INTRAVENOUS

## 2015-03-19 MED ORDER — HEPARIN SODIUM (PORCINE) 5000 UNIT/ML IJ SOLN
5000.0000 [IU] | Freq: Three times a day (TID) | INTRAMUSCULAR | Status: DC
  Administered 2015-03-19 – 2015-03-20 (×5): 5000 [IU] via SUBCUTANEOUS
  Filled 2015-03-19 (×5): qty 1

## 2015-03-19 MED ORDER — ALUM & MAG HYDROXIDE-SIMETH 200-200-20 MG/5ML PO SUSP: 30.0000 mL | Freq: Four times a day (QID) | ORAL | Status: DC | PRN

## 2015-03-19 MED ORDER — VITAMIN D 1000 UNITS PO TABS
2000.0000 [IU] | ORAL_TABLET | Freq: Every day | ORAL | Status: DC
  Administered 2015-03-19 – 2015-03-20 (×2): 2000 [IU] via ORAL
  Filled 2015-03-19 (×2): qty 2

## 2015-03-19 MED ORDER — IOHEXOL 350 MG/ML SOLN
100.0000 mL | Freq: Once | INTRAVENOUS | Status: AC | PRN
  Administered 2015-03-19: 100 mL via INTRAVENOUS

## 2015-03-19 NOTE — ED Notes (Signed)
Per MRI, the pt is being transported back to the ER because he is refusing his MRI. Per MRI, pt states that he can't breathe through his nose which is making him claustrophobic and anxious.

## 2015-03-19 NOTE — ED Notes (Signed)
Pt states that he will not want to do it today. He prefer to do it tomorrow when he has some rest. MD notifiedl.

## 2015-03-19 NOTE — ED Provider Notes (Signed)
CSN: [id number]     Arrival date & time 03/18/15  2123 History   First MD Initiated Contact with Patient 03/18/15 2229     Chief Complaint  Patient presents with  . Chest Pain     (Consider location/radiation/quality/duration/timing/severity/associated sxs/prior Treatment) HPI Patient presents to the emergency department with a set of chest discomfort and shortness of breath started yesterday.  The patient states that the pain seems to wax and wane in severity.  Nothing seems to make the condition better.  He states that exertion does seem to exacerbate it.  The patient states that he has had previous heart-related issues.  He states that he had a catheterization done 2 years ago and did not show any significant abnormalities.  Patient denies abdominal pain, nausea, vomiting, weakness, dizziness, headache, blurred vision, cough, fever, dysuria, abdominal pain, incontinence, back pain, or syncope.  The patient states that he did not take any medications prior to arrival Past Medical History  Diagnosis Date  . Hyperlipidemia   . Musculoskeletal pain     in the right shoulder - S/P ROTATOR CUFF REPAIR-BUT STILL HAS PAIN  . Laceration  01/11/2009     Deep laceration left index finger dorsally  . Diverticulosis   . Carotid stenosis     Carotid US (07/2013):  Bilateral 1-39% ICA (f/u 2 yrs)  . Exogenous obesity   . Thyroiditis   . Diverticulitis   . Benign prostatic hypertrophy   . Arthritis   . GERD (gastroesophageal reflux disease)   . Rectal bleeding     PT ATTRIBUTES TO HEMORRHOIDS  . Difficulty urinating   . Obstructive sleep apnea      CPAP    AUTO SET 6 TO 20 - USUALLY SETTLES OUT AT 10  . Hernia     Umbulical   . Ulcer 4650  . H/O hiatal hernia   . SUPRAVENTRICULAR TACHYCARDIA 08/01/2009    Qualifier: Diagnosis of  By: Lovette Cliche, CNA, Christy    . Chest pain     a. 01/2013 Cath: LM nl, LAD 25p, D1 25p, LCX nl, RCA 25p, PDA nl, RPL nl, EF 55%.  . Diabetes mellitus without  complication 3546   Past Surgical History  Procedure Laterality Date  . Inguinal hernia repair    . Kidney stone surgery    . Tonsillectomy    . Colonoscopy      for polyps  . Rotator cuff repair    . Knee surgery    . Elbow bursa surgery    . Laparoscopic sigmoid colectomy  07/23/2012    Procedure: LAPAROSCOPIC SIGMOID COLECTOMY;  Surgeon: [name], MD;  Location: [facility];  Service: General;  Laterality: N/A;  Laparoscopic Sigmoid Colectomy  . Umbilical hernia repair  07/23/2012    Procedure: HERNIA REPAIR UMBILICAL ADULT;  Surgeon: [name], MD;  Location: [facility];  Service: General;  Laterality: N/A;  Primary Umbilical Hernia Repair  . Proctoscopy  07/23/2012    Procedure: PROCTOSCOPY;  Surgeon: [name], MD;  Location: [facility];  Service: General;  Laterality: N/A;  Rigid Proctoscopy  . Eyelid surgery Bilateral 05/2013  . Left heart catheterization with coronary angiogram N/A 02/22/2013    Procedure: LEFT HEART CATHETERIZATION WITH CORONARY ANGIOGRAM;  Surgeon: [name], MD;  Location: [facility] CATH LAB;  Service: Cardiovascular;  Laterality: N/A;   Family History  Problem Relation Age of Onset  . Stroke Mother   . Heart disease Mother     Valvular heart disease,  Rheumatic fever   Social History  Substance Use Topics  . Smoking status: Former Smoker    Types: Cigars    Quit date: 08/12/1999  . Smokeless tobacco: Never Used     Comment: smoked cigars on and off for approx 4-5 yrs  . Alcohol Use: No    Review of Systems  All other systems negative except as documented in the HPI. All pertinent positives and negatives as reviewed in the HPI.  Allergies  Penicillins  Home Medications   Prior to Admission medications   Medication Sig Start Date End Date Taking? Authorizing Provider  aspirin 81 MG tablet Take 1 tablet (81 mg total) by mouth daily. 02/22/13  Yes Rogelia Mire, NP  cholecalciferol (VITAMIN D) 1000 UNITS tablet Take 2,000 Units by mouth  daily.    Yes Historical Provider, MD  Coenzyme Q10 (CO Q-10) 100 MG CAPS Take 100 mg by mouth daily.   Yes Historical Provider, MD  fluticasone (FLONASE) 50 MCG/ACT nasal spray Place 2 sprays into both nostrils daily. 01/24/14  Yes Chipper Herb, MD  Ibuprofen-Diphenhydramine Cit (ADVIL PM) 200-38 MG TABS Take 1 tablet by mouth at bedtime as needed (FOR SLEEP).   Yes Historical Provider, MD  metFORMIN (GLUCOPHAGE-XR) 500 MG 24 hr tablet Take 1 tablet by mouth  daily with breakfast 03/30/14  Yes Chipper Herb, MD  metoprolol (LOPRESSOR) 50 MG tablet Take one-half tablet by  mouth twice a day 12/09/14  Yes Chipper Herb, MD  Multiple Vitamins-Minerals (CENTRUM SILVER PO) Take 1 tablet by mouth daily.   Yes Historical Provider, MD  NITROSTAT 0.4 MG SL tablet DISSOLVE ONE TABLET UNDER THE TONGUE EVERY 5 MINUTES AS NEEDED FOR CHEST PAIN.  DO NOT EXCEED A TOTAL OF 3 DOSES IN 15 MINUTES 12/13/14  Yes Chipper Herb, MD  omeprazole (PRILOSEC) 20 MG capsule Take 1 capsule by mouth  twice daily before meals 12/09/14  Yes Chipper Herb, MD  PRESCRIPTION MEDICATION Inhale 1 application into the lungs at bedtime. "CPAP" MACHINE   Yes Historical Provider, MD  simvastatin (ZOCOR) 20 MG tablet Take 1 tablet (20 mg total) by mouth at bedtime. 04/07/13  Yes Chipper Herb, MD  sodium chloride (OCEAN) 0.65 % nasal spray Place 1 spray into the nose as needed for congestion.   Yes Historical Provider, MD  sucralfate (CARAFATE) 1 G tablet Take 1 tablet (1 g total) by mouth 2 (two) times daily. 10/28/13  Yes Chipper Herb, MD  tamsulosin (FLOMAX) 0.4 MG CAPS capsule Take 1 capsule (0.4 mg total) by mouth daily. 09/26/14  Yes Chipper Herb, MD  zolpidem (AMBIEN) 10 MG tablet TAKE ONE TABLET AT BEDTIME Patient taking differently: TAKE ONE TABLET AT BEDTIME AS NEEDED 12/22/14  Yes Chipper Herb, MD  glucose blood (ONETOUCH VERIO) test strip Check BS BID and PRN. Dx.250.0 03/08/14   Chipper Herb, MD  [facility] DELICA LANCETS  30Z MISC Check BS BID and PRN. DX. E11.8 07/19/14   Chipper Herb, MD   BP 139/69 mmHg  Pulse 62  Temp(Src) 97.8 F (36.6 C) (Oral)  Resp 12  Ht 6' (1.829 m)  Wt 262 lb 1.6 oz (118.888 kg)  BMI 35.54 kg/m2  SpO2 96% Physical Exam  Constitutional: He is oriented to person, place, and time. He appears well-developed and well-nourished. No distress.  HENT:  Head: Normocephalic and atraumatic.  Mouth/Throat: Oropharynx is clear and moist.  Eyes: Pupils are equal, round, and reactive to  light.  Neck: Normal range of motion. Neck supple.  Cardiovascular: Normal rate, regular rhythm and normal heart sounds.  Exam reveals no gallop and no friction rub.   No murmur heard. Pulmonary/Chest: Effort normal and breath sounds normal. No respiratory distress. He has no wheezes.  Neurological: He is alert and oriented to person, place, and time. He exhibits normal muscle tone. Coordination normal.  Skin: Skin is warm and dry. No rash noted. No erythema. No pallor.  Nursing note and vitals reviewed.   ED Course  Procedures (including critical care time) Labs Review Labs Reviewed  BASIC METABOLIC PANEL - Abnormal; Notable for the following:    Sodium 133 (*)    Chloride 100 (*)    Glucose, Bld 118 (*)    All other components within normal limits  CBC  I-STAT TROPOININ, ED    Imaging Review Dg Chest 2 View  03/18/2015   CLINICAL DATA:  Chest pain for 2 days.  EXAM: CHEST  2 VIEW  COMPARISON:  08/17/2014  FINDINGS: The cardiomediastinal contours are normal. Minimal atelectasis or scarring at the left lung base. Pulmonary vasculature is normal. No consolidation, pleural effusion, or pneumothorax. No acute osseous abnormalities are seen. Mild degenerative change in the spine.  IMPRESSION: Stable atelectasis or scarring at the left lung base. No acute pulmonary process.   Electronically Signed   By: Jeb Levering M.D.   On: 03/18/2015 22:29   I have personally reviewed and evaluated these  images and lab results as part of my medical decision-making.   EKG Interpretation   Date/Time:  Saturday March 18 2015 21:28:04 EDT Ventricular Rate:  81 PR Interval:  178 QRS Duration: 86 QT Interval:  384 QTC Calculation: 446 R Axis:   45 Text Interpretation:  Normal sinus rhythm Normal ECG since last tracing no  significant change Confirmed by MILLER  MD, BRIAN ([id number]) on 03/18/2015  11:16:55 PM      Patient is in stable here in the emergency department.  Patient most likely need to be admitted for serial troponins to look at his chest discomfort with shortness of breath.  The patient had a cath 2 years ago that showed mild occlusive disease   Dalia Heading, PA-C 03/19/15 5993  Noemi Chapel, MD 03/19/15 [phone]

## 2015-03-19 NOTE — ED Notes (Signed)
Hospitalist at the bedside 

## 2015-03-19 NOTE — Progress Notes (Signed)
Patient admitted after midnight, please see H&P. Cards consult Will allow to eat Eulogio Bear DO

## 2015-03-19 NOTE — Consult Note (Signed)
Admit date: 03/18/2015 Referring Physician  Dr. Eliseo Squires Primary Physician  Dr. Laurance Flatten Primary Cardiologist  Dr. Percival Spanish Reason for Consultation  Elevated trop  HPI: Cristian Moore is a 74 y.o. male with PMH of hyperlipidemia, diabetes mellitus, GERD, carotid artery stenosis, nonobstructive plaque on cath 2014 up to 25% in RCA and LAD, diverticulitis, BPH, GI bleeding, OSA, Bergmann syndrome, who presents with chest tightness, shortness of breath and left hand numbness.  He reports that he started having chest tightness and shortness of breath Friday afternoon while sitting outside on his porch with some friends.  He thought it was the heat and went inside but the discomfort continued and he was SOB. His chest tightness was pressure-like, radiating to the left scapular area, constant. It was not aggravated or alleviated with any known factors. He denies any have cough, or fever. Patient also had left calf tenderness, no recent long distance traveling.  He went to bed and continued through the night so his wife recommended that he go to the ER last night.  In ED, patient was found to have troponin negative, WBC 8.3, temperature normal, no tachycardia, electrolytes okay, negative chest x-ray for acute hemorrhage or masses. CTA is negative for PE.  Upon cycling enzymes further his troponin bumped slightly at 0.05 and cardiology is now asked to consult.  He currently is pain free.     PMH:   Past Medical History  Diagnosis Date  . Hyperlipidemia   . Musculoskeletal pain     in the right shoulder - S/P ROTATOR CUFF REPAIR-BUT STILL HAS PAIN  . Laceration  01/11/2009     Deep laceration left index finger dorsally  . Diverticulosis   . Carotid stenosis     Carotid US (07/2013):  Bilateral 1-39% ICA (f/u 2 yrs)  . Exogenous obesity   . Thyroiditis   . Diverticulitis   . Benign prostatic hypertrophy   . Arthritis   . GERD (gastroesophageal reflux disease)   . Rectal bleeding     PT ATTRIBUTES  TO HEMORRHOIDS  . Difficulty urinating   . Obstructive sleep apnea      CPAP    AUTO SET 6 TO 20 - USUALLY SETTLES OUT AT 10  . Hernia     Umbulical   . Ulcer 7169  . H/O hiatal hernia   . SUPRAVENTRICULAR TACHYCARDIA 08/01/2009    Qualifier: Diagnosis of  By: Lovette Cliche, CNA, Christy    . Chest pain     a. 01/2013 Cath: LM nl, LAD 25p, D1 25p, LCX nl, RCA 25p, PDA nl, RPL nl, EF 55%.  . Diabetes mellitus without complication 6789     PSH:   Past Surgical History  Procedure Laterality Date  . Inguinal hernia repair    . Kidney stone surgery    . Tonsillectomy    . Colonoscopy      for polyps  . Rotator cuff repair    . Knee surgery    . Elbow bursa surgery    . Laparoscopic sigmoid colectomy  07/23/2012    Procedure: LAPAROSCOPIC SIGMOID COLECTOMY;  Surgeon: [name], MD;  Location: [facility];  Service: General;  Laterality: N/A;  Laparoscopic Sigmoid Colectomy  . Umbilical hernia repair  07/23/2012    Procedure: HERNIA REPAIR UMBILICAL ADULT;  Surgeon: [name], MD;  Location: [facility];  Service: General;  Laterality: N/A;  Primary Umbilical Hernia Repair  . Proctoscopy  07/23/2012    Procedure: PROCTOSCOPY;  Surgeon: Remo Lipps  C. Gross, MD;  Location: [facility];  Service: General;  Laterality: N/A;  Rigid Proctoscopy  . Eyelid surgery Bilateral 05/2013  . Left heart catheterization with coronary angiogram N/A 02/22/2013    Procedure: LEFT HEART CATHETERIZATION WITH CORONARY ANGIOGRAM;  Surgeon: [name], MD;  Location: [facility] C[facility] LAB;  Service: Cardiovascular;  Laterality: N/A;    Allergies:  Penicillins Prior to Admit Meds:   Prescriptions prior to admission  Medication Sig Dispense Refill Last Dose  . aspirin 81 MG tablet Take 1 tablet (81 mg total) by mouth daily.   03/18/2015 at Unknown time  . cholecalciferol (VITAMIN D) 1000 UNITS tablet Take 2,000 Units by mouth daily.    03/17/2015 at Unknown time  . Coenzyme Q10 (CO Q-10) 100 MG CAPS Take 100 mg by mouth daily.   03/17/2015  at Unknown time  . fluticasone (FLONASE) 50 MCG/ACT nasal spray Place 2 sprays into both nostrils daily. 48 g 1 03/18/2015 at Unknown time  . Ibuprofen-Diphenhydramine Cit (ADVIL PM) 200-38 MG TABS Take 1 tablet by mouth at bedtime as needed (FOR SLEEP).   03/17/2015 at Unknown time  . metFORMIN (GLUCOPHAGE-XR) 500 MG 24 hr tablet Take 1 tablet by mouth  daily with breakfast 90 tablet 3 03/18/2015 at Unknown time  . metoprolol (LOPRESSOR) 50 MG tablet Take one-half tablet by  mouth twice a day 90 tablet 1 03/18/2015 at 2100  . Multiple Vitamins-Minerals (CENTRUM SILVER PO) Take 1 tablet by mouth daily.   03/17/2015 at Unknown time  . NITROSTAT 0.4 MG SL tablet DISSOLVE ONE TABLET UNDER THE TONGUE EVERY 5 MINUTES AS NEEDED FOR CHEST PAIN.  DO NOT EXCEED A TOTAL OF 3 DOSES IN 15 MINUTES 25 tablet 4 NOT USED  . omeprazole (PRILOSEC) 20 MG capsule Take 1 capsule by mouth  twice daily before meals 180 capsule 1 03/18/2015 at Unknown time  . PRESCRIPTION MEDICATION Inhale 1 application into the lungs at bedtime. "CPAP" MACHINE   03/17/2015 at Unknown time  . simvastatin (ZOCOR) 20 MG tablet Take 1 tablet (20 mg total) by mouth at bedtime. 90 tablet 3 03/18/2015 at Unknown time  . sodium chloride (OCEAN) 0.65 % nasal spray Place 1 spray into the nose as needed for congestion.   Past Week at Unknown time  . sucralfate (CARAFATE) 1 G tablet Take 1 tablet (1 g total) by mouth 2 (two) times daily. 180 tablet 3 03/18/2015 at AM  . tamsulosin (FLOMAX) 0.4 MG CAPS capsule Take 1 capsule (0.4 mg total) by mouth daily. 30 capsule 0 03/18/2015 at Unknown time  . zolpidem (AMBIEN) 10 MG tablet TAKE ONE TABLET AT BEDTIME (Patient taking differently: TAKE ONE TABLET AT BEDTIME AS NEEDED) 90 tablet 0 Past Month at Unknown time  . glucose blood (ONETOUCH VERIO) test strip Check BS BID and PRN. Dx.250.0 100 each 11 Taking  . ONETOUCH DELICA LANCETS 66A MISC Check BS BID and PRN. DX. E11.8 100 each 28 Taking   Fam HX:    Family  History  Problem Relation Age of Onset  . Stroke Mother   . Heart disease Mother     Valvular heart disease, Rheumatic fever   Social HX:    Social History   Social History  . Marital Status: Married    Spouse Name: N/A  . Number of Children: 4  . Years of Education: N/A   Occupational History  . Retired    Social History Main Topics  . Smoking status: Former Smoker  Types: Cigars    Quit date: 08/12/1999  . Smokeless tobacco: Never Used     Comment: smoked cigars on and off for approx 4-5 yrs  . Alcohol Use: No  . Drug Use: No  . Sexual Activity: Not on file   Other Topics Concern  . Not on file   Social History Narrative   Lives at home with wife     ROS:  All 11 ROS were addressed and are negative except what is stated in the HPI  Physical Exam: Blood pressure 132/79, pulse 62, temperature 97.9 F (36.6 C), temperature source Oral, resp. rate 20, height 6' 0.5" (1.842 m), weight 251 lb 8.7 oz (114.1 kg), SpO2 96 %.    General: Well developed, well nourished, in no acute distress Head: Eyes PERRLA, No xanthomas.   Normal cephalic and atramatic  Lungs:   Clear bilaterally to auscultation and percussion. Heart:   HRRR S1 S2 Pulses are 2+ & equal.            No carotid bruit. No JVD.  No abdominal bruits. No femoral bruits. Abdomen: Bowel sounds are positive, abdomen soft and non-tender without masses  Extremities:   No clubbing, cyanosis or edema.  DP +1 Neuro: Alert and oriented X 3. Psych:  Good affect, responds appropriately    Labs:   Lab Results  Component Value Date   WBC 7.1 03/19/2015   HGB 14.6 03/19/2015   HCT 42.2 03/19/2015   MCV 93.2 03/19/2015   PLT 223 03/19/2015    Recent Labs Lab 03/19/15 0628  NA 138  K 3.9  CL 106  CO2 23  BUN 11  CREATININE 0.96  CALCIUM 9.6  PROT 6.3*  BILITOT 1.0  ALKPHOS 35*  ALT 21  AST 24  GLUCOSE 108*   No results found for: PTT Lab Results  Component Value Date   INR 1.06 03/19/2015   INR  0.99 02/20/2013   INR 0.97 05/21/2011   Lab Results  Component Value Date   CKTOTAL 226* 07/19/2014   CKMB 3.3 05/04/2011   TROPONINI 0.05* 03/19/2015     Lab Results  Component Value Date   CHOL 137 11/24/2014   CHOL 179 07/04/2014   CHOL 155 01/31/2014   Lab Results  Component Value Date   HDL 45 11/24/2014   HDL 56 07/04/2014   HDL 46 01/31/2014   Lab Results  Component Value Date   LDLCALC 89 01/31/2014   LDLCALC 89 10/28/2013   LDLCALC 61 02/21/2013   Lab Results  Component Value Date   TRIG 101 11/24/2014   TRIG 119 07/04/2014   TRIG 99 01/31/2014   Lab Results  Component Value Date   CHOLHDL 3.1 02/21/2013   CHOLHDL 3.3 05/04/2011   No results found for: LDLDIRECT    Radiology:  Dg Chest 2 View  03/18/2015   CLINICAL DATA:  Chest pain for 2 days.  EXAM: CHEST  2 VIEW  COMPARISON:  08/17/2014  FINDINGS: The cardiomediastinal contours are normal. Minimal atelectasis or scarring at the left lung base. Pulmonary vasculature is normal. No consolidation, pleural effusion, or pneumothorax. No acute osseous abnormalities are seen. Mild degenerative change in the spine.  IMPRESSION: Stable atelectasis or scarring at the left lung base. No acute pulmonary process.   Electronically Signed   By: Jeb Levering M.D.   On: 03/18/2015 22:29   Ct Angio Chest Pe W/cm &/or Wo Cm  03/19/2015   CLINICAL DATA:  74 year old male with chest  tightness and shortness of breath  EXAM: CT ANGIOGRAPHY CHEST WITH CONTRAST  TECHNIQUE: Multidetector CT imaging of the chest was performed using the standard protocol during bolus administration of intravenous contrast. Multiplanar CT image reconstructions and MIPs were obtained to evaluate the vascular anatomy.  CONTRAST:  168mL OMNIPAQUE IOHEXOL 350 MG/ML SOLN  COMPARISON:  Radiograph dated 03/18/2015  FINDINGS: Minimal bibasilar linear atelectasis/ scarring. There is a 5 mm nodule in the lingula (series 401 image 62). Follow-up as per Fleischner  Society criteria. There is no focal consolidation, pleural effusion, or pneumothorax. The central airways are patent.  The thoracic aorta appears unremarkable. No CT evidence of pulmonary embolism. There is no hilar or mediastinal adenopathy. No cardiomegaly or pericardial effusion. The thyroid gland and esophagus is grossly unremarkable.  There is no axillary adenopathy. The visualized chest wall soft tissues appear unremarkable. There is degenerative changes of the spine. No acute fracture.  Stable appearing cyst as seen on the MRI dated 03/05/2012  Review of the MIP images confirms the above findings.  IMPRESSION: No CT evidence of pulmonary embolism.   Electronically Signed   By: Anner Crete M.D.   On: 03/19/2015 03:04    EKG:  NSR with low voltage, cannot rule out anterior infarct  ASSESSMENT/PLAN: 1.  Chest pain and SOB with slightly elevated troponin.  EKG is nonischemic.  Chest CT neg for PE.  He has a history of nonobstructive CAD by cath 2014.  2D echo pending.  Continue to trend trop.  If remains flat then will make NPO after MN.  Plan stress myoview in am.  If has typical peak and trough then plan cath. 2.  Nonobstructive coronary plaque by cath 2014 up to 25% in LAD and RCA on ASA/BB/statin 3.  HTN - controlled on BB 4.  DM - per IM 5.  Dyslipidemia - continue statin  Sueanne Margarita, MD  03/19/2015  11:29 AM

## 2015-03-19 NOTE — Progress Notes (Signed)
PT Cancellation Note  Patient Details Name: Cristian Moore MRN: [id number] DOB: [birth year]   Cancelled Treatment:    Reason Eval/Treat Not Completed: Other (comment). Pt occupied earlier with MD. Will follow up tomorrow.   MAYCOCK,CARY 03/19/2015, 5:13 PM

## 2015-03-19 NOTE — Progress Notes (Signed)
Patient home CPAP set up. Patient doesn't need any assistance in putting on the mask. RT will continue to monitor as needed.

## 2015-03-19 NOTE — Progress Notes (Signed)
Echocardiogram 2D Echocardiogram has been performed.  Cristian Moore 03/19/2015, 9:53 AM

## 2015-03-19 NOTE — H&P (Signed)
[facility] History and Physical  [name] QMV:[id number] DOB: June 02, 1941 DOA: 03/18/2015  Referring physician: ED physician PCP: Redge Gainer, MD  Specialists:   Chief Complaint: Chest tightness, shortness of breath, left hand numbness  HPI: Cristian Moore is a 74 y.o. male with PMH of hyperlipidemia, diabetes mellitus, GERD, carotid artery stenosis, diverticulitis, BPH, GI bleeding, OSA, Bergmann syndrome, who presents with chest tightness, shortness of breath and left hand numbness.  He reports that he started having chest tightness and shortness of breath yesterday morning. His chest tightness is pressure-like, radiating to the left scapular area, constant. It is not aggravated or alleviated with any known factors. He does not have cough, or fever. He has chills. Patient also has left calf tenderness, no recent long distance traveling. He reports that he bumped his left elbow 2 weeks ago. 4 days later, he started having numbness in left hand, mainly in the left fourth and fifth fingers. He does not have unilateral weakness. Vision change or hearing loss. She has chronic back pain, which has not changed. He does not have abdominal pain, diarrhea, symptoms of UTI or rashes.  In ED, patient was found to have troponin negative, WBC 8.3, temperature normal, no tachycardia, electrolytes okay, negative chest x-ray for acute hemorrhage or masses. CTA is negative for PE.  Where does patient live?   At home   Can patient participate in ADLs? Some   Review of Systems:   General: no fevers, chills, no changes in body weight, has poor appetite, has fatigue HEENT: no blurry vision, hearing changes or sore throat Pulm: has dyspnea, no coughing, wheezing CV: has chest tightness, no palpitations Abd: no nausea, vomiting, abdominal pain, diarrhea, constipation GU: no dysuria, burning on urination, increased urinary frequency, hematuria  Ext: no leg edema Neuro: no unilateral weakness,  has left hand numbness, no vision change or hearing loss Skin: no rash MSK: No muscle spasm, no deformity, no limitation of range of movement in spin. Has back pain. Heme: No easy bruising.  Travel history: No recent long distant travel.  Allergy:  Allergies  Allergen Reactions  . Penicillins Rash    Over 60 years ago    Past Medical History  Diagnosis Date  . Hyperlipidemia   . Musculoskeletal pain     in the right shoulder - S/P ROTATOR CUFF REPAIR-BUT STILL HAS PAIN  . Laceration  01/11/2009     Deep laceration left index finger dorsally  . Diverticulosis   . Carotid stenosis     Carotid US (07/2013):  Bilateral 1-39% ICA (f/u 2 yrs)  . Exogenous obesity   . Thyroiditis   . Diverticulitis   . Benign prostatic hypertrophy   . Arthritis   . GERD (gastroesophageal reflux disease)   . Rectal bleeding     PT ATTRIBUTES TO HEMORRHOIDS  . Difficulty urinating   . Obstructive sleep apnea      CPAP    AUTO SET 6 TO 20 - USUALLY SETTLES OUT AT 10  . Hernia     Umbulical   . Ulcer 2841  . H/O hiatal hernia   . SUPRAVENTRICULAR TACHYCARDIA 08/01/2009    Qualifier: Diagnosis of  By: Lovette Cliche, CNA, Christy    . Chest pain     a. 01/2013 Cath: LM nl, LAD 25p, D1 25p, LCX nl, RCA 25p, PDA nl, RPL nl, EF 55%.  . Diabetes mellitus without complication 3244    Past Surgical History  Procedure Laterality Date  . Inguinal hernia  repair    . Kidney stone surgery    . Tonsillectomy    . Colonoscopy      for polyps  . Rotator cuff repair    . Knee surgery    . Elbow bursa surgery    . Laparoscopic sigmoid colectomy  07/23/2012    Procedure: LAPAROSCOPIC SIGMOID COLECTOMY;  Surgeon: [name], MD;  Location: [facility];  Service: General;  Laterality: N/A;  Laparoscopic Sigmoid Colectomy  . Umbilical hernia repair  07/23/2012    Procedure: HERNIA REPAIR UMBILICAL ADULT;  Surgeon: [name], MD;  Location: [facility];  Service: General;  Laterality: N/A;  Primary Umbilical Hernia Repair   . Proctoscopy  07/23/2012    Procedure: PROCTOSCOPY;  Surgeon: [name], MD;  Location: [facility];  Service: General;  Laterality: N/A;  Rigid Proctoscopy  . Eyelid surgery Bilateral 05/2013  . Left heart catheterization with coronary angiogram N/A 02/22/2013    Procedure: LEFT HEART CATHETERIZATION WITH CORONARY ANGIOGRAM;  Surgeon: [name], MD;  Location: [facility] CATH LAB;  Service: Cardiovascular;  Laterality: N/A;    Social History:  reports that he quit smoking about 15 years ago. His smoking use included Cigars. He has never used smokeless tobacco. He reports that he does not drink alcohol or use illicit drugs.  Family History:  Family History  Problem Relation Age of Onset  . Stroke Mother   . Heart disease Mother     Valvular heart disease, Rheumatic fever     Prior to Admission medications   Medication Sig Start Date End Date Taking? Authorizing Provider  aspirin 81 MG tablet Take 1 tablet (81 mg total) by mouth daily. 02/22/13  Yes Rogelia Mire, NP  cholecalciferol (VITAMIN D) 1000 UNITS tablet Take 2,000 Units by mouth daily.    Yes Historical Provider, MD  Coenzyme Q10 (CO Q-10) 100 MG CAPS Take 100 mg by mouth daily.   Yes Historical Provider, MD  fluticasone (FLONASE) 50 MCG/ACT nasal spray Place 2 sprays into both nostrils daily. 01/24/14  Yes Chipper Herb, MD  Ibuprofen-Diphenhydramine Cit (ADVIL PM) 200-38 MG TABS Take 1 tablet by mouth at bedtime as needed (FOR SLEEP).   Yes Historical Provider, MD  metFORMIN (GLUCOPHAGE-XR) 500 MG 24 hr tablet Take 1 tablet by mouth  daily with breakfast 03/30/14  Yes Chipper Herb, MD  metoprolol (LOPRESSOR) 50 MG tablet Take one-half tablet by  mouth twice a day 12/09/14  Yes Chipper Herb, MD  Multiple Vitamins-Minerals (CENTRUM SILVER PO) Take 1 tablet by mouth daily.   Yes Historical Provider, MD  NITROSTAT 0.4 MG SL tablet DISSOLVE ONE TABLET UNDER THE TONGUE EVERY 5 MINUTES AS NEEDED FOR CHEST PAIN.  DO NOT EXCEED A  TOTAL OF 3 DOSES IN 15 MINUTES 12/13/14  Yes Chipper Herb, MD  omeprazole (PRILOSEC) 20 MG capsule Take 1 capsule by mouth  twice daily before meals 12/09/14  Yes Chipper Herb, MD  PRESCRIPTION MEDICATION Inhale 1 application into the lungs at bedtime. "CPAP" MACHINE   Yes Historical Provider, MD  simvastatin (ZOCOR) 20 MG tablet Take 1 tablet (20 mg total) by mouth at bedtime. 04/07/13  Yes Chipper Herb, MD  sodium chloride (OCEAN) 0.65 % nasal spray Place 1 spray into the nose as needed for congestion.   Yes Historical Provider, MD  sucralfate (CARAFATE) 1 G tablet Take 1 tablet (1 g total) by mouth 2 (two) times daily. 10/28/13  Yes Chipper Herb, MD  tamsulosin (FLOMAX) 0.4 MG CAPS capsule Take 1 capsule (0.4 mg total) by mouth daily. 09/26/14  Yes Chipper Herb, MD  zolpidem (AMBIEN) 10 MG tablet TAKE ONE TABLET AT BEDTIME Patient taking differently: TAKE ONE TABLET AT BEDTIME AS NEEDED 12/22/14  Yes Chipper Herb, MD  glucose blood (ONETOUCH VERIO) test strip Check BS BID and PRN. Dx.250.0 03/08/14   Chipper Herb, MD  [facility] DELICA LANCETS 29N MISC Check BS BID and PRN. DX. E11.8 07/19/14   Chipper Herb, MD    Physical Exam: Filed Vitals:   03/19/15 0230 03/19/15 0245 03/19/15 0300 03/19/15 0435  BP: 143/69 125/65 116/54 156/64  Pulse: 62 65 62 59  Temp:    97.9 F (36.6 C)  TempSrc:    Oral  Resp:      Height:    6' 0.5" (1.842 m)  Weight:    114.1 kg (251 lb 8.7 oz)  SpO2: 96% 99% 97% 99%   General: Not in acute distress HEENT:       Eyes: PERRL, EOMI, no scleral icterus.       ENT: No discharge from the ears and nose, no pharynx injection, no tonsillar enlargement.        Neck: No JVD, no bruit, no mass felt. Heme: No neck lymph node enlargement. Cardiac: S1/S2, RRR, No murmurs, No gallops or rubs. Pulm: No rales, wheezing, rhonchi or rubs. Abd: Soft, nondistended, nontender, no rebound pain, no organomegaly, BS present. Ext: No pitting leg edema bilaterally. 2+DP/PT  pulse bilaterally. His tenderness over her left calf area. Musculoskeletal: No joint deformities, No joint redness or warmth, no limitation of ROM in spin. Skin: No rashes.  Neuro: Alert, oriented X3, cranial nerves II-XII grossly intact, muscle strength 5/5 in all extremities, sensation to light touch intact. Brachial reflex 2+ bilaterally. Knee reflex 1+ bilaterally. Negative Babinski's sign. Normal finger to nose test. Psych: Patient is not psychotic, no suicidal or hemocidal ideation.  Labs on Admission:  Basic Metabolic Panel:  Recent Labs Lab 03/18/15 2143  NA 133*  K 3.6  CL 100*  CO2 23  GLUCOSE 118*  BUN 13  CREATININE 1.05  CALCIUM 9.6   Liver Function Tests: No results for input(s): AST, ALT, ALKPHOS, BILITOT, PROT, ALBUMIN in the last 168 hours. No results for input(s): LIPASE, AMYLASE in the last 168 hours. No results for input(s): AMMONIA in the last 168 hours. CBC:  Recent Labs Lab 03/18/15 2143  WBC 8.3  HGB 14.7  HCT 43.1  MCV 92.5  PLT 232   Cardiac Enzymes:  Recent Labs Lab 03/19/15 0225  TROPONINI <0.03    BNP (last 3 results) No results for input(s): BNP in the last 8760 hours.  ProBNP (last 3 results) No results for input(s): PROBNP in the last 8760 hours.  CBG: No results for input(s): GLUCAP in the last 168 hours.  Radiological Exams on Admission: Dg Chest 2 View  03/18/2015   CLINICAL DATA:  Chest pain for 2 days.  EXAM: CHEST  2 VIEW  COMPARISON:  08/17/2014  FINDINGS: The cardiomediastinal contours are normal. Minimal atelectasis or scarring at the left lung base. Pulmonary vasculature is normal. No consolidation, pleural effusion, or pneumothorax. No acute osseous abnormalities are seen. Mild degenerative change in the spine.  IMPRESSION: Stable atelectasis or scarring at the left lung base. No acute pulmonary process.   Electronically Signed   By: Jeb Levering M.D.   On: 03/18/2015 22:29   Ct Angio Chest Pe  W/cm &/or Wo  Cm  03/19/2015   CLINICAL DATA:  74 year old male with chest tightness and shortness of breath  EXAM: CT ANGIOGRAPHY CHEST WITH CONTRAST  TECHNIQUE: Multidetector CT imaging of the chest was performed using the standard protocol during bolus administration of intravenous contrast. Multiplanar CT image reconstructions and MIPs were obtained to evaluate the vascular anatomy.  CONTRAST:  183mL OMNIPAQUE IOHEXOL 350 MG/ML SOLN  COMPARISON:  Radiograph dated 03/18/2015  FINDINGS: Minimal bibasilar linear atelectasis/ scarring. There is a 5 mm nodule in the lingula (series 401 image 62). Follow-up as per Fleischner Society criteria. There is no focal consolidation, pleural effusion, or pneumothorax. The central airways are patent.  The thoracic aorta appears unremarkable. No CT evidence of pulmonary embolism. There is no hilar or mediastinal adenopathy. No cardiomegaly or pericardial effusion. The thyroid gland and esophagus is grossly unremarkable.  There is no axillary adenopathy. The visualized chest wall soft tissues appear unremarkable. There is degenerative changes of the spine. No acute fracture.  Stable appearing cyst as seen on the MRI dated 03/05/2012  Review of the MIP images confirms the above findings.  IMPRESSION: No CT evidence of pulmonary embolism.   Electronically Signed   By: Anner Crete M.D.   On: 03/19/2015 03:04    EKG: Independently reviewed.  No ischemia change   Assessment/Plan Principal Problem:   Chest tightness Active Problems:   HLD (hyperlipidemia)   Obesity (BMI 30-39.9)   Carotid artery stenosis   Shortness of breath   Obstructive sleep apnea   Essential hypertension   Non-Obstructive CAD   DM (diabetes mellitus), type 2 with complications   Acid reflux   BPH (benign prostatic hyperplasia)   Numbness of left hand   Chest tightness and SOB: Chest x-ray is negative for pneumonia. CTA is negative for PE. Will admit to chest pain rule out.   - will admit to Tele  bed  - cycle CE q6 x3 and repeat her EKG in the am  - Nitroglycerin, Morphine, and aspirin, zocor and lipitor - Consider cardiology consult if test positive for CEs  - 2d echo - LE doppler to r/o DVT  HLD: Last LDL was 61 on 02/22/15 -Continue home medications: Zocor  Carotid artery stenosis: -ASA  OSA: -CPAP  Essential hypertension: -Metoprolol -patient is also on Flomax for BPH  DM-II: Last A1c 5.5, well controled. Patient is taking metformin at home -SSI  GERD: -Protonix  Numbness of left hand: likely due to recent injury to ulnar nerve, but can not completely rule out stroke or TIA. -will get MRI-brain to r/o stroke   DVT ppx: SQ Heparin    Code Status: Full code Family Communication:  Yes, patient's wife at bed side Disposition Plan: Admit to inpatient   Date of Service 03/19/2015    Ivor Costa [facility] Pager [phone]  If 7PM-7AM, please contact night-coverage [email or web address] Password [email or web address] 03/19/2015, 5:13 AM

## 2015-03-20 ENCOUNTER — Inpatient Hospital Stay (HOSPITAL_COMMUNITY): Payer: Medicare Other

## 2015-03-20 ENCOUNTER — Inpatient Hospital Stay (HOSPITAL_BASED_OUTPATIENT_CLINIC_OR_DEPARTMENT_OTHER): Payer: Medicare Other

## 2015-03-20 DIAGNOSIS — I6529 Occlusion and stenosis of unspecified carotid artery: Secondary | ICD-10-CM | POA: Diagnosis not present

## 2015-03-20 DIAGNOSIS — E118 Type 2 diabetes mellitus with unspecified complications: Secondary | ICD-10-CM | POA: Diagnosis not present

## 2015-03-20 DIAGNOSIS — R079 Chest pain, unspecified: Secondary | ICD-10-CM | POA: Diagnosis present

## 2015-03-20 DIAGNOSIS — K5790 Diverticulosis of intestine, part unspecified, without perforation or abscess without bleeding: Secondary | ICD-10-CM | POA: Diagnosis not present

## 2015-03-20 DIAGNOSIS — I10 Essential (primary) hypertension: Secondary | ICD-10-CM

## 2015-03-20 DIAGNOSIS — R0602 Shortness of breath: Secondary | ICD-10-CM

## 2015-03-20 DIAGNOSIS — E785 Hyperlipidemia, unspecified: Secondary | ICD-10-CM | POA: Diagnosis not present

## 2015-03-20 DIAGNOSIS — K219 Gastro-esophageal reflux disease without esophagitis: Secondary | ICD-10-CM | POA: Diagnosis not present

## 2015-03-20 DIAGNOSIS — R0789 Other chest pain: Secondary | ICD-10-CM | POA: Diagnosis not present

## 2015-03-20 LAB — GLUCOSE, CAPILLARY
GLUCOSE-CAPILLARY: 111 mg/dL — AB (ref 65–99)
Glucose-Capillary: 110 mg/dL — ABNORMAL HIGH (ref 65–99)

## 2015-03-20 LAB — NM MYOCAR MULTI W/SPECT W/WALL MOTION / EF
CHL CUP RESTING HR STRESS: 62 {beats}/min
CSEPED: 5 min
CSEPEDS: 14 s
Estimated workload: 1 METS
MPHR: 145 {beats}/min
Peak HR: 93 {beats}/min
Percent HR: 64 %

## 2015-03-20 MED ORDER — REGADENOSON 0.4 MG/5ML IV SOLN
0.4000 mg | Freq: Once | INTRAVENOUS | Status: DC
  Filled 2015-03-20: qty 5

## 2015-03-20 MED ORDER — REGADENOSON 0.4 MG/5ML IV SOLN
0.4000 mg | Freq: Once | INTRAVENOUS | Status: AC
  Administered 2015-03-20: 0.4 mg via INTRAVENOUS
  Filled 2015-03-20: qty 5

## 2015-03-20 MED ORDER — TECHNETIUM TC 99M SESTAMIBI GENERIC - CARDIOLITE
30.0000 | Freq: Once | INTRAVENOUS | Status: AC | PRN
  Administered 2015-03-20: 30 via INTRAVENOUS

## 2015-03-20 MED ORDER — NITROGLYCERIN 0.4 MG SL SUBL
SUBLINGUAL_TABLET | SUBLINGUAL | Status: AC
  Filled 2015-03-20: qty 1

## 2015-03-20 MED ORDER — REGADENOSON 0.4 MG/5ML IV SOLN
INTRAVENOUS | Status: AC
  Filled 2015-03-20: qty 5

## 2015-03-20 MED ORDER — TECHNETIUM TC 99M SESTAMIBI GENERIC - CARDIOLITE
10.0000 | Freq: Once | INTRAVENOUS | Status: AC | PRN
  Administered 2015-03-20: 10 via INTRAVENOUS

## 2015-03-20 MED ORDER — NITROGLYCERIN 0.4 MG SL SUBL
0.4000 mg | SUBLINGUAL_TABLET | SUBLINGUAL | Status: DC | PRN
  Administered 2015-03-20 (×2): 0.4 mg via SUBLINGUAL

## 2015-03-20 MED ORDER — ASPIRIN 325 MG PO TABS
325.0000 mg | ORAL_TABLET | Freq: Every day | ORAL | Status: DC
Start: 2015-03-20 — End: 2016-01-03

## 2015-03-20 NOTE — Progress Notes (Signed)
   03/20/15 1303  PT G-Codes **NOT FOR INPATIENT CLASS**  Functional Assessment Tool Used assist level  Functional Limitation Mobility: Walking and moving around  Mobility: Walking and Moving Around Current Status ([id number]) CH  Mobility: Walking and Moving Around Goal Status [phone]) CH  Mobility: Walking and Moving Around Discharge Status [phone]) [facility]  03/20/2015 late entry g-code Rebecca B. [place], [place], DPT [phone]

## 2015-03-20 NOTE — Progress Notes (Signed)
UR Completed Tonnya Garbett Graves-Bigelow, RN,BSN [phone]  

## 2015-03-20 NOTE — Progress Notes (Signed)
PT Cancellation Note  Patient Details Name: Cristian Moore MRN: [id number] DOB: [birth year]   Cancelled Treatment:    Reason Eval/Treat Not Completed: Patient at procedure or test/unavailable.  Pt still at test.  PT will check back later.  Thanks,    Barbarann Ehlers. [place], PT, DPT [phone]   03/20/2015, 11:09 AM

## 2015-03-20 NOTE — Discharge Summary (Signed)
Physician Discharge Summary  Cristian Moore SNK:[id number] DOB: 05/12/1941 DOA: 03/18/2015  PCP: Redge Gainer, MD  Admit date: 03/18/2015 Discharge date: 03/20/2015  Time spent: 35 minutes  Recommendations for Outpatient Follow-up:  1. Wife suspects patient is having anxiety  Discharge Diagnoses:  Principal Problem:   Chest tightness Active Problems:   HLD (hyperlipidemia)   Obesity (BMI 30-39.9)   Carotid artery stenosis   Shortness of breath   Obstructive sleep apnea   Essential hypertension   Non-Obstructive CAD   DM (diabetes mellitus), type 2 with complications   Acid reflux   BPH (benign prostatic hyperplasia)   Numbness of left hand   Discharge Condition: improved  Diet recommendation: cardiac  Filed Weights   03/18/15 2129 03/19/15 0435  Weight: 118.888 kg (262 lb 1.6 oz) 114.1 kg (251 lb 8.7 oz)    History of present illness:  Cristian Moore is a 74 y.o. male with PMH of hyperlipidemia, diabetes mellitus, GERD, carotid artery stenosis, diverticulitis, BPH, GI bleeding, OSA, Bergmann syndrome, who presents with chest tightness, shortness of breath and left hand numbness.  He reports that he started having chest tightness and shortness of breath yesterday morning. His chest tightness is pressure-like, radiating to the left scapular area, constant. It is not aggravated or alleviated with any known factors. He does not have cough, or fever. He has chills. Patient also has left calf tenderness, no recent long distance traveling. He reports that he bumped his left elbow 2 weeks ago. 4 days later, he started having numbness in left hand, mainly in the left fourth and fifth fingers. He does not have unilateral weakness. Vision change or hearing loss. She has chronic back pain, which has not changed. He does not have abdominal pain, diarrhea, symptoms of UTI or rashes.  In ED, patient was found to have troponin negative, WBC 8.3, temperature normal, no tachycardia,  electrolytes okay, negative chest x-ray for acute hemorrhage or masses. CTA is negative for PE.   Hospital Course:  Chest pain and SOB with slightly elevated troponin (0.05). EKG nonischemic. Chest CT neg for PE. He has a history of nonobstructive CAD by cath 2014 up to 25% in LAD and RCA for similar chest tightness  -- 2D ECHO 03/19/15 with normal LVEF (50-55%), no RWMA, G1DD, and trivial AR.  -- Troponin has now normalized. Stress test low risk -- Continue ASA/BB/statin --? Anxiety  HTN - controlled on BB  DM - Last A1c 5.5, well controled. Patient is taking metformin at home  Dyslipidemia - continue statin  Procedures:  Stress test  Consultations:  cards  Discharge Exam: Filed Vitals:   03/20/15 1424  BP: 106/55  Pulse: 56  Temp: 97.7 F (36.5 C)  Resp: 16    General: awake, NAD   Discharge Instructions   Discharge Instructions    Diet - low sodium heart healthy    Complete by:  As directed      Increase activity slowly    Complete by:  As directed           Current Discharge Medication List    CONTINUE these medications which have CHANGED   Details  aspirin 325 MG tablet Take 1 tablet (325 mg total) by mouth daily.      CONTINUE these medications which have NOT CHANGED   Details  cholecalciferol (VITAMIN D) 1000 UNITS tablet Take 2,000 Units by mouth daily.     Coenzyme Q10 (CO Q-10) 100 MG CAPS Take 100 mg by mouth  daily.    metFORMIN (GLUCOPHAGE-XR) 500 MG 24 hr tablet Take 1 tablet by mouth  daily with breakfast Qty: 90 tablet, Refills: 3    metoprolol (LOPRESSOR) 50 MG tablet Take one-half tablet by  mouth twice a day Qty: 90 tablet, Refills: 1    Multiple Vitamins-Minerals (CENTRUM SILVER PO) Take 1 tablet by mouth daily.    NITROSTAT 0.4 MG SL tablet DISSOLVE ONE TABLET UNDER THE TONGUE EVERY 5 MINUTES AS NEEDED FOR CHEST PAIN.  DO NOT EXCEED A TOTAL OF 3 DOSES IN 15 MINUTES Qty: 25 tablet, Refills: 4    omeprazole (PRILOSEC) 20 MG  capsule Take 1 capsule by mouth  twice daily before meals Qty: 180 capsule, Refills: 1    PRESCRIPTION MEDICATION Inhale 1 application into the lungs at bedtime. "CPAP" MACHINE    simvastatin (ZOCOR) 20 MG tablet Take 1 tablet (20 mg total) by mouth at bedtime. Qty: 90 tablet, Refills: 3    sodium chloride (OCEAN) 0.65 % nasal spray Place 1 spray into the nose as needed for congestion.    sucralfate (CARAFATE) 1 G tablet Take 1 tablet (1 g total) by mouth 2 (two) times daily. Qty: 180 tablet, Refills: 3    tamsulosin (FLOMAX) 0.4 MG CAPS capsule Take 1 capsule (0.4 mg total) by mouth daily. Qty: 30 capsule, Refills: 0    zolpidem (AMBIEN) 10 MG tablet TAKE ONE TABLET AT BEDTIME Qty: 90 tablet, Refills: 0    fluticasone (FLONASE) 50 MCG/ACT nasal spray Use 2 sprays in each  nostril daily Qty: 48 g, Refills: 0    glucose blood (ONETOUCH VERIO) test strip Check BS BID and PRN. Dx.250.0 Qty: 100 each, Refills: 11    ONETOUCH DELICA LANCETS 66Q MISC Check BS BID and PRN. DX. E11.8 Qty: 100 each, Refills: 11      STOP taking these medications     Ibuprofen-Diphenhydramine Cit (ADVIL PM) 200-38 MG TABS        Allergies  Allergen Reactions  . Penicillins Rash    Over 60 years ago   Follow-up Information    Follow up with Redge Gainer, MD In 1 week.   Specialty:  Family Medicine   Contact information:   [address] [place] [address] [phone]        The results of significant diagnostics from this hospitalization (including imaging, microbiology, ancillary and laboratory) are listed below for reference.    Significant Diagnostic Studies: Dg Chest 2 View  03/18/2015   CLINICAL DATA:  Chest pain for 2 days.  EXAM: CHEST  2 VIEW  COMPARISON:  08/17/2014  FINDINGS: The cardiomediastinal contours are normal. Minimal atelectasis or scarring at the left lung base. Pulmonary vasculature is normal. No consolidation, pleural effusion, or pneumothorax. No acute osseous  abnormalities are seen. Mild degenerative change in the spine.  IMPRESSION: Stable atelectasis or scarring at the left lung base. No acute pulmonary process.   Electronically Signed   By: Jeb Levering M.D.   On: 03/18/2015 22:29   Dg Elbow 2 Views Left  03/10/2015   CLINICAL DATA:  Hit elbow 1 week ago.  Pain into the little finger.  EXAM: LEFT ELBOW - 2 VIEW  COMPARISON:  None.  FINDINGS: There is no evidence of fracture, dislocation, or joint effusion. There is no evidence of arthropathy or other focal bone abnormality. Soft tissues are unremarkable.  IMPRESSION: Negative.   Electronically Signed   By: Nolon Nations M.D.   On: 03/10/2015 13:55  Ct Head Wo Contrast  03/19/2015   CLINICAL DATA:  Acute development of speech disturbance.  EXAM: CT HEAD WITHOUT CONTRAST  TECHNIQUE: Contiguous axial images were obtained from the base of the skull through the vertex without intravenous contrast.  COMPARISON:  None.  FINDINGS: The brain shows age related atrophy. There is no evidence of old or acute small or large vessel infarction. No mass lesion, hemorrhage, hydrocephalus or extra-axial collection. There is atherosclerotic calcification of the major vessels at the base of the brain. No hyperdense vessels unrelated to the calcium. No signs of early ischemia. The calvarium is unremarkable. There is some mucosal inflammatory change of the paranasal sinuses with a small amount of fluid in the maxillary sinuses.  IMPRESSION: No acute finding by CT. Age related volume loss without focal insult.   Electronically Signed   By: Nelson Chimes M.D.   On: 03/19/2015 14:01   Ct Angio Chest Pe W/cm &/or Wo Cm  03/19/2015   CLINICAL DATA:  74 year old male with chest tightness and shortness of breath  EXAM: CT ANGIOGRAPHY CHEST WITH CONTRAST  TECHNIQUE: Multidetector CT imaging of the chest was performed using the standard protocol during bolus administration of intravenous contrast. Multiplanar CT image reconstructions  and MIPs were obtained to evaluate the vascular anatomy.  CONTRAST:  123mL OMNIPAQUE IOHEXOL 350 MG/ML SOLN  COMPARISON:  Radiograph dated 03/18/2015  FINDINGS: Minimal bibasilar linear atelectasis/ scarring. There is a 5 mm nodule in the lingula (series 401 image 62). Follow-up as per Fleischner Society criteria. There is no focal consolidation, pleural effusion, or pneumothorax. The central airways are patent.  The thoracic aorta appears unremarkable. No CT evidence of pulmonary embolism. There is no hilar or mediastinal adenopathy. No cardiomegaly or pericardial effusion. The thyroid gland and esophagus is grossly unremarkable.  There is no axillary adenopathy. The visualized chest wall soft tissues appear unremarkable. There is degenerative changes of the spine. No acute fracture.  Stable appearing cyst as seen on the MRI dated 03/05/2012  Review of the MIP images confirms the above findings.  IMPRESSION: No CT evidence of pulmonary embolism.   Electronically Signed   By: Anner Crete M.D.   On: 03/19/2015 03:04   Nm Myocar Multi W/spect W/wall Motion / Ef  03/20/2015   CLINICAL DATA:  Chest pain and shortness of breath.  EXAM: MYOCARDIAL IMAGING WITH SPECT (REST AND PHARMACOLOGIC-STRESS)  GATED LEFT VENTRICULAR WALL MOTION STUDY  LEFT VENTRICULAR EJECTION FRACTION  TECHNIQUE: Standard myocardial SPECT imaging was performed after resting intravenous injection of 10 mCi Tc-72m sestamibi. Subsequently, intravenous infusion of Lexiscan was performed under the supervision of the Cardiology staff. At peak effect of the drug, 30 mCi Tc-82m sestamibi was injected intravenously and standard myocardial SPECT imaging was performed. Quantitative gated imaging was also performed to evaluate left ventricular wall motion, and estimate left ventricular ejection fraction.  COMPARISON:  Chest CTA 03/19/2015  FINDINGS: Perfusion: Decreased uptake along the inferior wall in the mid and basilar segments on both the rest and  stress imaging. No clear evidence for reversibility or ischemia.  Wall Motion: Normal left ventricular wall motion. No left ventricular dilation.  Left Ventricular Ejection Fraction: 59 %  End diastolic volume 70 ml  End systolic volume 29 ml  IMPRESSION: 1. No evidence for reversibility or ischemia. Decreased uptake along the inferior wall on both the rest and stress images may be related to diaphragmatic attenuation because wall motion is preserved in this area.  2. Normal left ventricular wall motion.  3. Left ventricular ejection fraction is 59%.  4. Low-risk stress test findings*.  *2012 Appropriate Use Criteria for Coronary Revascularization Focused Update: J Am Coll Cardiol. 2446;28(6):381-771. http://content.airportbarriers.com.aspx?articleid=1201161   Electronically Signed   By: Markus Daft M.D.   On: 03/20/2015 13:18    Microbiology: No results found for this or any previous visit (from the past 240 hour(s)).   Labs: Basic Metabolic Panel:  Recent Labs Lab 03/18/15 2143 03/19/15 0628  NA 133* 138  K 3.6 3.9  CL 100* 106  CO2 23 23  GLUCOSE 118* 108*  BUN 13 11  CREATININE 1.05 0.96  CALCIUM 9.6 9.6   Liver Function Tests:  Recent Labs Lab 03/19/15 0628  AST 24  ALT 21  ALKPHOS 35*  BILITOT 1.0  PROT 6.3*  ALBUMIN 3.8   No results for input(s): LIPASE, AMYLASE in the last 168 hours. No results for input(s): AMMONIA in the last 168 hours. CBC:  Recent Labs Lab 03/18/15 2143 03/19/15 0628  WBC 8.3 7.1  HGB 14.7 14.6  HCT 43.1 42.2  MCV 92.5 93.2  PLT 232 223   Cardiac Enzymes:  Recent Labs Lab 03/19/15 0225 03/19/15 0628 03/19/15 1510  TROPONINI <0.03 0.05* <0.03   BNP: BNP (last 3 results) No results for input(s): BNP in the last 8760 hours.  ProBNP (last 3 results) No results for input(s): PROBNP in the last 8760 hours.  CBG:  Recent Labs Lab 03/19/15 1215 03/19/15 1634 03/19/15 2040 03/20/15 0759 03/20/15 1252  GLUCAP 93 101* 92  110* 111*       Signed:  VANN, JESSICA  [facility] 03/20/2015, 2:35 PM

## 2015-03-20 NOTE — Progress Notes (Addendum)
Patient presented for Lexiscan. Tolerated procedure well. However, patient developed substernal chest tightness after test, just before start taking pictures. Associated with with shoulder and upper back pain. Back pain was more noticeable. Rate at 5-6/10. Symptoms improved after SL nitro x 2. Repeat EKG without acute abnormality. Final result to follow. [place] to read.  Bhagat,Bhavinkumar, [place]

## 2015-03-20 NOTE — Evaluation (Signed)
Physical Therapy Evaluation/Discharge Patient Details Name: Cristian Moore MRN: [id number] DOB: 1941/03/10 Today's Date: 03/20/2015   History of Present Illness  74 y.o. male admitted to [facility] on 03/18/15 with chest tightness, SOB, L hand numbness.  Pt with slightly elevated troponin (0.05), CT negative for PE.  EKG  is nonischemic.  Cardiac workup continues.  Pt with significant PMHx of carotid stenosis, SVT, DM, Knee surgery, and RTC repair.  Clinical Impression  Pt's HR and O2 sats were stable on RA during all mobility including stairs.  Minimal DOE reported after stairs 1/4.  Pt is independent with all of his mobility and has no further acute or f/u PT needs at this time.  No DME needs.  PT to sign off.     Follow Up Recommendations No PT follow up    Equipment Recommendations  None recommended by PT    Recommendations for Other Services   NA    Precautions / Restrictions Precautions Precautions: None      Mobility  Bed Mobility Overal bed mobility: Independent                Transfers Overall transfer level: Independent                  Ambulation/Gait Ambulation/Gait assistance: Independent Ambulation Distance (Feet): 400 Feet Assistive device: None Gait Pattern/deviations: WFL(Within Functional Limits)   Gait velocity interpretation: at or above normal speed for age/gender    Stairs Stairs: Yes Stairs assistance: Modified independent (Device/Increase time) Stair Management: One rail Right;One rail Left;Alternating pattern;Forwards Number of Stairs: 4 (x2) General stair comments: Pt self reports some mild DOE with stairs 1/4, O2 sats and HR stable on RA.          Balance Overall balance assessment: No apparent balance deficits (not formally assessed);Independent                                           Pertinent Vitals/Pain Pain Assessment: No/denies pain    Home Living Family/patient expects to be discharged to::  Private residence Living Arrangements: Spouse/significant other Available Help at Discharge: Family;Available 24 hours/day Type of Home: House Home Access: Stairs to enter   [employer] of Steps: 3 Home Layout: Laundry or work area in basement;Two level        Prior Function Level of Independence: Independent                  Extremity/Trunk Assessment   Upper Extremity Assessment: Overall WFL for tasks assessed           Lower Extremity Assessment: Overall WFL for tasks assessed      Cervical / Trunk Assessment: Other exceptions  Communication   Communication: No difficulties  Cognition Arousal/Alertness: Awake/alert Behavior During Therapy: WFL for tasks assessed/performed Overall Cognitive Status: Within Functional Limits for tasks assessed                               Assessment/Plan    PT Assessment Patent does not need any further PT services  PT Diagnosis Generalized weakness         PT Goals (Current goals can be found in the Care Plan section) Acute Rehab PT Goals Patient Stated Goal: to go home today if he can, figure out where this pain is coming from.  PT  Goal Formulation: All assessment and education complete, DC therapy               End of Session   Activity Tolerance: Patient tolerated treatment well Patient left: in bed;with call bell/phone within reach;with family/visitor present Nurse Communication: Mobility status         Time: 9450-3888 PT Time Calculation (min) (ACUTE ONLY): 25 min   Charges:   PT Evaluation $Initial PT Evaluation Tier I: 1 Procedure PT Treatments $Gait Training: 8-22 mins        Rebecca B. Medendorp, PT, DPT [phone]   03/20/2015, 1:10 PM

## 2015-03-20 NOTE — Plan of Care (Signed)
Problem: Consults Goal: Chest Pain Patient Education (See Patient Education module for education specifics.) Outcome: Progressing Instructed pt to notify RN at onset of CPr

## 2015-03-20 NOTE — Progress Notes (Signed)
SLP Cancellation Note  Patient Details Name: BLESS BELSHE MRN: [id number] DOB: Jan 16, 1941   Cancelled treatment:       Reason Eval/Treat Not Completed: SLP screened, no needs identified, will sign off   DeBlois, Katherene Ponto 03/20/2015, 1:58 PM

## 2015-03-20 NOTE — Progress Notes (Signed)
Patient Name: Cristian Moore Date of Encounter: 03/20/2015     Principal Problem:   Chest tightness Active Problems:   HLD (hyperlipidemia)   Obesity (BMI 30-39.9)   Carotid artery stenosis   Shortness of breath   Obstructive sleep apnea   Essential hypertension   Non-Obstructive CAD   DM (diabetes mellitus), type 2 with complications   Acid reflux   BPH (benign prostatic hyperplasia)   Numbness of left hand    SUBJECTIVE  No further chest pain. Okay with proceeding with stress test.    CURRENT MEDS . aspirin  325 mg Oral Daily  . cholecalciferol  2,000 Units Oral Daily  . fluticasone  2 spray Each Nare Daily  . heparin  5,000 Units Subcutaneous 3 times per day  . insulin aspart  0-9 Units Subcutaneous TID WC  . LORazepam  1 mg Intravenous Once  . metoprolol  25 mg Oral BID  . multivitamin with minerals  1 tablet Oral Daily  . pantoprazole  40 mg Oral Daily  . simvastatin  20 mg Oral QHS  . sodium chloride  3 mL Intravenous Q12H  . sucralfate  1 g Oral BID  . tamsulosin  0.4 mg Oral Daily    OBJECTIVE  Filed Vitals:   03/19/15 2000 03/19/15 2028 03/19/15 2237 03/20/15 0446  BP: 159/75  132/75 123/69  Pulse: 67 68 60 61  Temp: 98.2 F (36.8 C)  98.5 F (36.9 C) 98.6 F (37 C)  TempSrc: Oral  Oral Oral  Resp: 18 18 18    Height:      Weight:      SpO2: 96% 97% 96% 98%    Intake/Output Summary (Last 24 hours) at 03/20/15 0651 Last data filed at 03/20/15 0000  Gross per 24 hour  Intake   1580 ml  Output   1375 ml  Net    205 ml   Filed Weights   03/18/15 2129 03/19/15 0435  Weight: 262 lb 1.6 oz (118.888 kg) 251 lb 8.7 oz (114.1 kg)    PHYSICAL EXAM  General: Pleasant, NAD. Neuro: Alert and oriented X 3. Moves all extremities spontaneously. Psych: Normal affect. HEENT:  Normal  Neck: Supple without bruits or JVD. Lungs:  Resp regular and unlabored, CTA. Heart: RRR no s3, s4, or murmurs. Abdomen: Soft, non-tender, non-distended, BS + x 4.   Extremities: No clubbing, cyanosis or edema. DP/PT/Radials 2+ and equal bilaterally.  Accessory Clinical Findings  CBC  Recent Labs  03/18/15 2143 03/19/15 0628  WBC 8.3 7.1  HGB 14.7 14.6  HCT 43.1 42.2  MCV 92.5 93.2  PLT 232 756   Basic Metabolic Panel  Recent Labs  03/18/15 2143 03/19/15 0628  NA 133* 138  K 3.6 3.9  CL 100* 106  CO2 23 23  GLUCOSE 118* 108*  BUN 13 11  CREATININE 1.05 0.96  CALCIUM 9.6 9.6   Liver Function Tests  Recent Labs  03/19/15 0628  AST 24  ALT 21  ALKPHOS 35*  BILITOT 1.0  PROT 6.3*  ALBUMIN 3.8   No results for input(s): LIPASE, AMYLASE in the last 72 hours. Cardiac Enzymes  Recent Labs  03/19/15 0225 03/19/15 0628 03/19/15 1510  TROPONINI <0.03 0.05* <0.03    TELE  NSR  Radiology/Studies  Dg Chest 2 View  03/18/2015   CLINICAL DATA:  Chest pain for 2 days.  EXAM: CHEST  2 VIEW  COMPARISON:  08/17/2014  FINDINGS: The cardiomediastinal contours are normal. Minimal atelectasis or  scarring at the left lung base. Pulmonary vasculature is normal. No consolidation, pleural effusion, or pneumothorax. No acute osseous abnormalities are seen. Mild degenerative change in the spine.  IMPRESSION: Stable atelectasis or scarring at the left lung base. No acute pulmonary process.   Electronically Signed   By: Jeb Levering M.D.   On: 03/18/2015 22:29   Dg Elbow 2 Views Left  03/10/2015   CLINICAL DATA:  Hit elbow 1 week ago.  Pain into the little finger.  EXAM: LEFT ELBOW - 2 VIEW  COMPARISON:  None.  FINDINGS: There is no evidence of fracture, dislocation, or joint effusion. There is no evidence of arthropathy or other focal bone abnormality. Soft tissues are unremarkable.  IMPRESSION: Negative.   Electronically Signed   By: Nolon Nations M.D.   On: 03/10/2015 13:55   Ct Head Wo Contrast  03/19/2015   CLINICAL DATA:  Acute development of speech disturbance.  EXAM: CT HEAD WITHOUT CONTRAST  TECHNIQUE: Contiguous axial images  were obtained from the base of the skull through the vertex without intravenous contrast.  COMPARISON:  None.  FINDINGS: The brain shows age related atrophy. There is no evidence of old or acute small or large vessel infarction. No mass lesion, hemorrhage, hydrocephalus or extra-axial collection. There is atherosclerotic calcification of the major vessels at the base of the brain. No hyperdense vessels unrelated to the calcium. No signs of early ischemia. The calvarium is unremarkable. There is some mucosal inflammatory change of the paranasal sinuses with a small amount of fluid in the maxillary sinuses.  IMPRESSION: No acute finding by CT. Age related volume loss without focal insult.   Electronically Signed   By: Nelson Chimes M.D.   On: 03/19/2015 14:01   Ct Angio Chest Pe W/cm &/or Wo Cm  03/19/2015   CLINICAL DATA:  74 year old male with chest tightness and shortness of breath  EXAM: CT ANGIOGRAPHY CHEST WITH CONTRAST  TECHNIQUE: Multidetector CT imaging of the chest was performed using the standard protocol during bolus administration of intravenous contrast. Multiplanar CT image reconstructions and MIPs were obtained to evaluate the vascular anatomy.  CONTRAST:  168mL OMNIPAQUE IOHEXOL 350 MG/ML SOLN  COMPARISON:  Radiograph dated 03/18/2015  FINDINGS: Minimal bibasilar linear atelectasis/ scarring. There is a 5 mm nodule in the lingula (series 401 image 62). Follow-up as per Fleischner Society criteria. There is no focal consolidation, pleural effusion, or pneumothorax. The central airways are patent.  The thoracic aorta appears unremarkable. No CT evidence of pulmonary embolism. There is no hilar or mediastinal adenopathy. No cardiomegaly or pericardial effusion. The thyroid gland and esophagus is grossly unremarkable.  There is no axillary adenopathy. The visualized chest wall soft tissues appear unremarkable. There is degenerative changes of the spine. No acute fracture.  Stable appearing cyst as seen  on the MRI dated 03/05/2012  Review of the MIP images confirms the above findings.  IMPRESSION: No CT evidence of pulmonary embolism.   Electronically Signed   By: Anner Crete M.D.   On: 03/19/2015 03:04   2D ECHO: 03/19/2015 LV EF: 50% -  55% Study Conclusions - Left ventricle: The cavity size was normal. Systolic function was normal. The estimated ejection fraction was in the range of 50% to 55%. Wall motion was normal; there were no regional wall motion abnormalities. Doppler parameters are consistent with abnormal left ventricular relaxation (grade 1 diastolic dysfunction). - Aortic valve: There was trivial regurgitation.    ASSESSMENT AND PLAN  KEATS KINGRY is  a 74 y.o. male with PMH of hyperlipidemia, diabetes mellitus, GERD, carotid artery stenosis, nonobstructive plaque on cath 2014 up to 25% in RCA and LAD, diverticulitis, BPH, GI bleeding, OSA,  who presented to [facility] on 03/19/15 with chest tightness, shortness of breath and left hand numbness.  Chest pain and SOB with slightly elevated troponin (0.05). EKG nonischemic. Chest CT neg for PE. He has a history of nonobstructive CAD by cath 2014 up to 25% in LAD and RCA for similar chest tightness  -- 2D ECHO 03/19/15 with normal LVEF (50-55%), no RWMA, G1DD, and trivial AR.  -- Troponin has now normalized. Will plan for lexiscan myoview today.  -- Continue ASA/BB/statin  HTN - controlled on BB  DM - per IM  Dyslipidemia - continue statin  Judy Pimple PA-C  Pager [phone]  Patient examined chart reviewed.  Pain free this am  Prolonged symptoms with negative enzymes  Benign exam CT with no PE.  For myovue today can d/c if non ischemic  Cath 2014 with no CAD  Jenkins Rouge

## 2015-03-20 NOTE — Progress Notes (Signed)
OT Cancellation Note  Patient Details Name: Cristian Moore MRN: [id number] DOB: May 16, 1941   Cancelled Treatment:    Reason Eval/Treat Not Completed: Patient at procedure or test/ unavailable  Benito Mccreedy OTR/L [phone] 03/20/2015, 11:23 AM

## 2015-03-20 NOTE — Progress Notes (Signed)
VASCULAR LAB PRELIMINARY  PRELIMINARY  PRELIMINARY  PRELIMINARY  Bilateral lower extremity venous duplex completed.    Preliminary report:  There is no DVT or SVT noted in the bilateral lower extremities.   KANADY, CANDACE, RVT 03/20/2015, 12:04 PM

## 2015-03-21 ENCOUNTER — Telehealth: Payer: Self-pay | Admitting: Family Medicine

## 2015-03-21 NOTE — Telephone Encounter (Signed)
Patient needs a hospital follow up. He was discharged yesterday and he is aware that Dr. Laurance Flatten does not have any openings and wants to see Dr. Laurance Flatten before he leaves for vacation. Can you please review?

## 2015-03-21 NOTE — Telephone Encounter (Signed)
appt made

## 2015-03-23 ENCOUNTER — Other Ambulatory Visit: Payer: Self-pay | Admitting: Family Medicine

## 2015-03-23 NOTE — Telephone Encounter (Signed)
Last filled 12/22/14, coming in next week for visit. Call in at [place]

## 2015-03-24 ENCOUNTER — Ambulatory Visit
Admission: RE | Admit: 2015-03-24 | Discharge: 2015-03-24 | Disposition: A | Discharge status: Home / Self Care | Payer: Medicare Other | Source: Ambulatory Visit | Attending: Endocrinology | Admitting: Endocrinology

## 2015-03-24 DIAGNOSIS — E042 Nontoxic multinodular goiter: Secondary | ICD-10-CM | POA: Diagnosis not present

## 2015-03-24 DIAGNOSIS — E041 Nontoxic single thyroid nodule: Secondary | ICD-10-CM

## 2015-03-27 ENCOUNTER — Encounter: Payer: Self-pay | Admitting: Family Medicine

## 2015-03-27 ENCOUNTER — Ambulatory Visit (INDEPENDENT_AMBULATORY_CARE_PROVIDER_SITE_OTHER): Payer: Medicare Other | Admitting: Family Medicine

## 2015-03-27 VITALS — BP 123/80 | HR 76 | Temp 98.6°F | Ht 72.5 in | Wt 257.0 lb

## 2015-03-27 DIAGNOSIS — Z09 Encounter for follow-up examination after completed treatment for conditions other than malignant neoplasm: Secondary | ICD-10-CM | POA: Diagnosis not present

## 2015-03-27 DIAGNOSIS — Z23 Encounter for immunization: Secondary | ICD-10-CM

## 2015-03-27 MED ORDER — ESCITALOPRAM OXALATE 10 MG PO TABS: 10.0000 mg | ORAL_TABLET | Freq: Every day | ORAL | Status: DC

## 2015-03-27 MED ORDER — ALPRAZOLAM 0.25 MG PO TABS: ORAL_TABLET | ORAL | Status: DC

## 2015-03-27 NOTE — Patient Instructions (Signed)
The patient should take the Lexapro one half tablet daily until he is checked at his next visit  He should take the Xanax one half tablet twice daily if needed for anxiety and panic  He should avoid caffeine as much as possible

## 2015-03-27 NOTE — Progress Notes (Signed)
Subjective:    Patient ID: MALIEK SCHELLHORN, male    DOB: Jan 12, 1941, 74 y.o.   MRN: [id number]  HPI Patient here today for hospital follow up from [facility] where he was seen for chest pains. Hospital workup was negative for any damage to the heart. He had a nuclear stress test and this was negative. He had a CT of the head and the chest and there was no sign of any problems with the head or abnormalities and no sign of any pulmonary embolus with the CT of the chest. The hospital record was reviewed with the patient. He will get thyroid labs today he does have bilateral nodules present and he has a follow-up appointment with the endocrinologist. The patient denies any further chest pain or tightness since he is been discharged from the hospital. He admits to having panic issues when he was younger. He brings in blood pressures for review today which had been up and down. His blood pressure here was good.       Patient Active Problem List   Diagnosis Date Noted  . Chest pain 03/20/2015  . Chest tightness 03/19/2015  . Numbness of left hand 03/19/2015  . Insomnia 07/19/2014  . Vitamin D deficiency 07/19/2014  . BPH (benign prostatic hyperplasia) 07/19/2014  . DM (diabetes mellitus), type 2 with complications 81/44/8185  . Acid reflux 06/03/2013  . 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 Personal history of other diseases of circulatory system 06/03/2013  . Loss of peripheral visual field 05/18/2013  . Non-Obstructive CAD 03/05/2013  . Essential hypertension 02/22/2013  . Shortness of breath   . Obstructive sleep apnea   . Diverticulitis, recurrent, s/p lap sigmoid colectomy 07/23/2012 06/02/2012  . Carotid artery stenosis 09/28/2009  . Obesity (BMI 30-39.9) 08/02/2009  . HLD (hyperlipidemia) 08/01/2009   Outpatient Encounter Prescriptions as of 03/27/2015  Medication Sig  . aspirin 325 MG tablet Take 1 tablet (325 mg total) by mouth daily.  . cholecalciferol (VITAMIN D) 1000 UNITS tablet  Take 2,000 Units by mouth daily.   . Coenzyme Q10 (CO Q-10) 100 MG CAPS Take 100 mg by mouth daily.  . fluticasone (FLONASE) 50 MCG/ACT nasal spray Use 2 sprays in each  nostril daily  . glucose blood (ONETOUCH VERIO) test strip Check BS BID and PRN. Dx.250.0  . metFORMIN (GLUCOPHAGE-XR) 500 MG 24 hr tablet Take 1 tablet by mouth  daily with breakfast  . metoprolol (LOPRESSOR) 50 MG tablet Take one-half tablet by  mouth twice a day  . Multiple Vitamins-Minerals (CENTRUM SILVER PO) Take 1 tablet by mouth daily.  Marland Kitchen NITROSTAT 0.4 MG SL tablet DISSOLVE ONE TABLET UNDER THE TONGUE EVERY 5 MINUTES AS NEEDED FOR CHEST PAIN.  DO NOT EXCEED A TOTAL OF 3 DOSES IN 15 MINUTES  . omeprazole (PRILOSEC) 20 MG capsule Take 1 capsule by mouth  twice daily before meals  . ONETOUCH DELICA LANCETS 63J MISC Check BS BID and PRN. DX. E11.8  . PRESCRIPTION MEDICATION Inhale 1 application into the lungs at bedtime. "CPAP" MACHINE  . simvastatin (ZOCOR) 20 MG tablet Take 1 tablet (20 mg total) by mouth at bedtime.  . sodium chloride (OCEAN) 0.65 % nasal spray Place 1 spray into the nose as needed for congestion.  . sucralfate (CARAFATE) 1 G tablet Take 1 tablet (1 g total) by mouth 2 (two) times daily.  . tamsulosin (FLOMAX) 0.4 MG CAPS capsule Take 1 capsule (0.4 mg total) by mouth daily.  Marland Kitchen zolpidem (AMBIEN) 10 MG tablet  TAKE ONE TABLET AT BEDTIME   No facility-administered encounter medications on file as of 03/27/2015.     Review of Systems  Constitutional: Positive for fatigue.  HENT: Negative.   Eyes: Negative.   Respiratory: Negative.   Cardiovascular: Negative.   Gastrointestinal: Negative.   Endocrine: Negative.   Genitourinary: Negative.   Musculoskeletal: Negative.   Skin: Negative.   Allergic/Immunologic: Negative.   Neurological: Positive for weakness.  Hematological: Negative.   Psychiatric/Behavioral: The patient is nervous/anxious.        Not sleeping well       Objective:   Physical  Exam  Constitutional: He is oriented to person, place, and time. He appears well-developed and well-nourished. No distress.  HENT:  Head: Normocephalic and atraumatic.  Right Ear: External ear normal.  Left Ear: External ear normal.  Nose: Nose normal.  Mouth/Throat: Oropharynx is clear and moist. No oropharyngeal exudate.  The patient wears bilateral hearing aids  Eyes: Conjunctivae and EOM are normal. Pupils are equal, round, and reactive to light. Right eye exhibits no discharge. Left eye exhibits no discharge. No scleral icterus.  Neck: Normal range of motion. Neck supple. No thyromegaly present.  Cardiovascular: Normal rate and regular rhythm.   No murmur heard. At 72/m  Pulmonary/Chest: Effort normal and breath sounds normal. No respiratory distress. He has no wheezes. He has no rales. He exhibits no tenderness.  Clear anteriorly and posteriorly and no axillary adenopathy  Abdominal: Soft. Bowel sounds are normal. He exhibits no mass. There is no tenderness. There is no rebound and no guarding.  Abdominal obesity without masses tenderness or organ enlargement  Musculoskeletal: Normal range of motion. He exhibits no edema.  Lymphadenopathy:    He has no cervical adenopathy.  Neurological: He is alert and oriented to person, place, and time. No cranial nerve deficit.  Skin: Skin is warm and dry. No rash noted.  Psychiatric: He has a normal mood and affect. His behavior is normal. Judgment and thought content normal.  Nursing note and vitals reviewed.  BP 123/80 mmHg  Pulse 76  Temp(Src) 98.6 F (37 C) (Oral)  Ht 6' 0.5" (1.842 m)  Wt 257 lb (116.574 kg)  BMI 34.36 kg/m2        Assessment & Plan:  1. Hospital discharge follow-up - The patient will arrange a follow-up appointment with his endocrinologist - Thyroid Panel With TSH - BMP8+EGFR - Hepatic function panel  2. DM (diabetes mellitus), type 2 with complications -He has a routine return appointment in October  with [place] to follow-up on his blood sugar  3. Panic disorder -Take Lexapro as directed one half tablet daily until patient is rechecked -Take Xanax one half tablet twice daily if needed for anxiety  4. Multiple thyroid nodules  Meds ordered this encounter  Medications  . ALPRAZolam (XANAX) 0.25 MG tablet    Sig: 1/2 tab BID PRN for anxiety    Dispense:  30 tablet    Refill:  1  . escitalopram (LEXAPRO) 10 MG tablet    Sig: Take 1 tablet (10 mg total) by mouth daily. As directed    Dispense:  30 tablet    Refill:  3   Patient Instructions   The patient should take the Lexapro one half tablet daily until he is checked at his next visit  He should take the Xanax one half tablet twice daily if needed for anxiety and panic  He should avoid caffeine as much as possible   Arrie Senate  MD   

## 2015-03-28 LAB — HEPATIC FUNCTION PANEL
ALBUMIN: 4.7 g/dL (ref 3.5–4.8)
ALK PHOS: 44 IU/L (ref 39–117)
ALT: 25 IU/L (ref 0–44)
AST: 20 IU/L (ref 0–40)
BILIRUBIN TOTAL: 0.4 mg/dL (ref 0.0–1.2)
Bilirubin, Direct: 0.14 mg/dL (ref 0.00–0.40)
Total Protein: 6.8 g/dL (ref 6.0–8.5)

## 2015-03-28 LAB — BMP8+EGFR
BUN/Creatinine Ratio: 11 (ref 10–22)
BUN: 12 mg/dL (ref 8–27)
CALCIUM: 10.1 mg/dL (ref 8.6–10.2)
CO2: 25 mmol/L (ref 18–29)
CREATININE: 1.05 mg/dL (ref 0.76–1.27)
Chloride: 99 mmol/L (ref 97–108)
GFR, EST AFRICAN AMERICAN: 80 mL/min/{1.73_m2} (ref >59)
GFR, EST NON AFRICAN AMERICAN: 69 mL/min/{1.73_m2} (ref >59)
Glucose: 88 mg/dL (ref 65–99)
Potassium: 4.6 mmol/L (ref 3.5–5.2)
SODIUM: 141 mmol/L (ref 134–144)

## 2015-03-28 LAB — THYROID PANEL WITH TSH
Free Thyroxine Index: 1.9 (ref 1.2–4.9)
T3 UPTAKE RATIO: 27 % (ref 24–39)
T4 TOTAL: 7 ug/dL (ref 4.5–12.0)
TSH: 0.552 u[IU]/mL (ref 0.450–4.500)

## 2015-03-28 NOTE — Progress Notes (Signed)
Patient aware.

## 2015-03-29 ENCOUNTER — Ambulatory Visit: Payer: Self-pay

## 2015-04-05 ENCOUNTER — Other Ambulatory Visit: Payer: Self-pay | Admitting: Endocrinology

## 2015-04-05 DIAGNOSIS — E041 Nontoxic single thyroid nodule: Secondary | ICD-10-CM

## 2015-04-12 ENCOUNTER — Encounter: Payer: Self-pay | Admitting: Family Medicine

## 2015-04-12 ENCOUNTER — Ambulatory Visit (INDEPENDENT_AMBULATORY_CARE_PROVIDER_SITE_OTHER): Payer: Medicare Other | Admitting: Family Medicine

## 2015-04-12 VITALS — BP 106/66 | HR 59 | Temp 97.2°F | Ht 72.5 in | Wt 259.0 lb

## 2015-04-12 DIAGNOSIS — E559 Vitamin D deficiency, unspecified: Secondary | ICD-10-CM | POA: Diagnosis not present

## 2015-04-12 DIAGNOSIS — E118 Type 2 diabetes mellitus with unspecified complications: Secondary | ICD-10-CM | POA: Diagnosis not present

## 2015-04-12 DIAGNOSIS — N400 Enlarged prostate without lower urinary tract symptoms: Secondary | ICD-10-CM

## 2015-04-12 DIAGNOSIS — I10 Essential (primary) hypertension: Secondary | ICD-10-CM

## 2015-04-12 LAB — POCT UA - MICROALBUMIN: Microalbumin Ur, POC: NEGATIVE mg/L

## 2015-04-12 NOTE — Patient Instructions (Addendum)
Medicare Annual Wellness Visit  [facility] and the medical providers at [facility] strive to bring you the best medical care.  In doing so we not only want to address your current medical conditions and concerns but also to detect new conditions early and prevent illness, disease and health-related problems.    Medicare offers a yearly Wellness Visit which allows our clinical staff to assess your need for preventative services including immunizations, lifestyle education, counseling to decrease risk of preventable diseases and screening for fall risk and other medical concerns.    This visit is provided free of charge (no copay) for all Medicare recipients. The clinical pharmacists at [facility] have begun to conduct these Wellness Visits which will also include a thorough review of all your medications.    As you primary medical provider recommend that you make an appointment for your Annual Wellness Visit if you have not done so already this year.  You may set up this appointment before you leave today or you may call back ([phone]) and schedule an appointment.  Please make sure when you call that you mention that you are scheduling your Annual Wellness Visit with the clinical pharmacist so that the appointment may be made for the proper length of time.     Continue current medications. Continue good therapeutic lifestyle changes which include good diet and exercise. Fall precautions discussed with patient. If an FOBT was given today- please return it to our front desk. If you are over 7 years old - you may need Prevnar 13 or the adult Pneumonia vaccine.  **Flu shots will be available soon--- please call and schedule a FLU-CLINIC appointment**  After your visit with [place] today you will receive a survey in the mail or online from [employer] regarding your care with [place]. Please take a moment to fill this out. Your feedback is  very important to [place] as you can help [place] better understand your patient needs as well as improve your experience and satisfaction. WE CARE ABOUT YOU!!!   Walk and exercise and drink more water Avoid heavy lifting pushing and pulling Continue sucralfate as currently doing Increase the Lexapro to 10 mg daily and continue to take the Xanax only if needed for extreme anxiety We will arrange for you to have an appointment with the cardiologist since this has not occurred up to this point. Continue to monitor blood sugars at home and keep feet checked regularly

## 2015-04-12 NOTE — Progress Notes (Signed)
Subjective:    Patient ID: Cristian Moore, male    DOB: 27-Jun-1941, 74 y.o.   MRN: [id number]  HPI Pt here for follow up and management of chronic medical problems which includes hypertension and diabetes. He is taking medication regularly. The patient is doing better today. He brings labs from the [place] and these were reviewed and were all good including his hemoglobin A1c and this will be scanned into the record. He is also seeing the orthopedic surgeons about his back and getting injections in his back because of his chronic back pain and he will continue to see them. The patient denies chest pain shortness of breath trouble swallowing heartburn and indigestion nausea vomiting diarrhea or blood in the stool today. He'll continue to follow-up with orthopedic surgeons regarding his back pain and he promises to continue to work on his diet and try to lose some weight.       Patient Active Problem List   Diagnosis Date Noted  . Chest pain 03/20/2015  . Chest tightness 03/19/2015  . Numbness of left hand 03/19/2015  . Insomnia 07/19/2014  . Vitamin D deficiency 07/19/2014  . BPH (benign prostatic hyperplasia) 07/19/2014  . DM (diabetes mellitus), type 2 with complications ([place]) 08/67/6195  . Acid reflux 06/03/2013  . 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 Personal history of other diseases of circulatory system 06/03/2013  . Loss of peripheral visual field 05/18/2013  . Non-Obstructive CAD 03/05/2013  . Essential hypertension 02/22/2013  . Shortness of breath   . Obstructive sleep apnea   . Diverticulitis, recurrent, s/p lap sigmoid colectomy 07/23/2012 06/02/2012  . Carotid artery stenosis 09/28/2009  . Obesity (BMI 30-39.9) 08/02/2009  . HLD (hyperlipidemia) 08/01/2009   Outpatient Encounter Prescriptions as of 04/12/2015  Medication Sig  . ALPRAZolam (XANAX) 0.25 MG tablet 1/2 tab BID PRN for anxiety  . aspirin 325 MG tablet Take 1 tablet (325 mg total) by mouth daily.  .  cholecalciferol (VITAMIN D) 1000 UNITS tablet Take 2,000 Units by mouth daily.   . Coenzyme Q10 (CO Q-10) 100 MG CAPS Take 100 mg by mouth daily.  Marland Kitchen escitalopram (LEXAPRO) 10 MG tablet Take 1 tablet (10 mg total) by mouth daily. As directed  . fluticasone (FLONASE) 50 MCG/ACT nasal spray Use 2 sprays in each  nostril daily  . glucose blood (ONETOUCH VERIO) test strip Check BS BID and PRN. Dx.250.0  . metFORMIN (GLUCOPHAGE-XR) 500 MG 24 hr tablet Take 1 tablet by mouth  daily with breakfast  . metoprolol (LOPRESSOR) 50 MG tablet Take one-half tablet by  mouth twice a day  . Multiple Vitamins-Minerals (CENTRUM SILVER PO) Take 1 tablet by mouth daily.  Marland Kitchen NITROSTAT 0.4 MG SL tablet DISSOLVE ONE TABLET UNDER THE TONGUE EVERY 5 MINUTES AS NEEDED FOR CHEST PAIN.  DO NOT EXCEED A TOTAL OF 3 DOSES IN 15 MINUTES  . omeprazole (PRILOSEC) 20 MG capsule Take 1 capsule by mouth  twice daily before meals  . ONETOUCH DELICA LANCETS 09T MISC Check BS BID and PRN. DX. E11.8  . PRESCRIPTION MEDICATION Inhale 1 application into the lungs at bedtime. "CPAP" MACHINE  . simvastatin (ZOCOR) 20 MG tablet Take 1 tablet (20 mg total) by mouth at bedtime.  . sodium chloride (OCEAN) 0.65 % nasal spray Place 1 spray into the nose as needed for congestion.  . sucralfate (CARAFATE) 1 G tablet Take 1 tablet (1 g total) by mouth 2 (two) times daily.  . tamsulosin (FLOMAX) 0.4 MG CAPS capsule Take  1 capsule (0.4 mg total) by mouth daily.  Marland Kitchen zolpidem (AMBIEN) 10 MG tablet TAKE ONE TABLET AT BEDTIME   No facility-administered encounter medications on file as of 04/12/2015.      Review of Systems  Constitutional: Negative.   HENT: Negative.   Eyes: Negative.   Respiratory: Negative.   Cardiovascular: Negative.   Gastrointestinal: Negative.   Endocrine: Negative.   Genitourinary: Negative.   Musculoskeletal: Negative.   Skin: Negative.   Allergic/Immunologic: Negative.   Neurological: Negative.   Hematological:  Negative.   Psychiatric/Behavioral: Negative.        Objective:   Physical Exam  Constitutional: He is oriented to person, place, and time. He appears well-developed and well-nourished. No distress.  HENT:  Head: Normocephalic and atraumatic.  Right Ear: External ear normal.  Left Ear: External ear normal.  Nose: Nose normal.  Mouth/Throat: Oropharynx is clear and moist. No oropharyngeal exudate.  Eyes: Conjunctivae and EOM are normal. Pupils are equal, round, and reactive to light. Right eye exhibits no discharge. Left eye exhibits no discharge. No scleral icterus.  Neck: Normal range of motion. Neck supple. No thyromegaly present.  Cardiovascular: Normal rate, regular rhythm, normal heart sounds and intact distal pulses.   No murmur heard. At 72/m  Pulmonary/Chest: Effort normal and breath sounds normal. No respiratory distress. He has no wheezes. He has no rales. He exhibits no tenderness.  Abdominal: Soft. Bowel sounds are normal. He exhibits no mass. There is no tenderness. There is no rebound and no guarding.  Musculoskeletal: Normal range of motion. He exhibits no edema.  Lymphadenopathy:    He has no cervical adenopathy.  Neurological: He is alert and oriented to person, place, and time. He has normal reflexes. No cranial nerve deficit.  Skin: Skin is warm and dry. No rash noted.  Psychiatric: He has a normal mood and affect. His behavior is normal. Judgment and thought content normal.  Nursing note and vitals reviewed.  BP 106/66 mmHg  Pulse 59  Temp(Src) 97.2 F (36.2 C) (Oral)  Ht 6' 0.5" (1.842 m)  Wt 259 lb (117.482 kg)  BMI 34.63 kg/m2        Assessment & Plan:  1. Type 2 diabetes mellitus with complication, without long-term current use of insulin (HCC) -The recent hemoglobin A1c was good and the patient should continue with current treatment. This record was scanned into the computer. - POCT UA - Microalbumin  2. Essential hypertension -The blood  pressure today is also good and the patient should continue with current treatment - POCT UA - Microalbumin  3. BPH (benign prostatic hyperplasia) -He is having no problems with voiding at this time.  4. Vitamin D deficiency -Continue with current vitamin D replacement and check routine lab work again at next visit unless done by the [facility]  Patient Instructions                       Medicare Annual Wellness Visit  [facility] and the medical providers at [facility] strive to bring you the best medical care.  In doing so we not only want to address your current medical conditions and concerns but also to detect new conditions early and prevent illness, disease and health-related problems.    Medicare offers a yearly Wellness Visit which allows our clinical staff to assess your need for preventative services including immunizations, lifestyle education, counseling to decrease risk of preventable diseases and screening for fall risk and other  medical concerns.    This visit is provided free of charge (no copay) for all Medicare recipients. The clinical pharmacists at [facility] have begun to conduct these Wellness Visits which will also include a thorough review of all your medications.    As you primary medical provider recommend that you make an appointment for your Annual Wellness Visit if you have not done so already this year.  You may set up this appointment before you leave today or you may call back [phone]) and schedule an appointment.  Please make sure when you call that you mention that you are scheduling your Annual Wellness Visit with the clinical pharmacist so that the appointment may be made for the proper length of time.     Continue current medications. Continue good therapeutic lifestyle changes which include good diet and exercise. Fall precautions discussed with patient. If an FOBT was given today- please return it to our front  desk. If you are over 22 years old - you may need Prevnar 77 or the adult Pneumonia vaccine.  **Flu shots will be available soon--- please call and schedule a FLU-CLINIC appointment**  After your visit with [place] today you will receive a survey in the mail or online from [employer] regarding your care with [place]. Please take a moment to fill this out. Your feedback is very important to [place] as you can help [place] better understand your patient needs as well as improve your experience and satisfaction. WE CARE ABOUT YOU!!!   Walk and exercise and drink more water Avoid heavy lifting pushing and pulling Continue sucralfate as currently doing Increase the Lexapro to 10 mg daily and continue to take the Xanax only if needed for extreme anxiety We will arrange for you to have an appointment with the cardiologist since this has not occurred up to this point. Continue to monitor blood sugars at home and keep feet checked regularly   Arrie Senate MD

## 2015-04-13 ENCOUNTER — Ambulatory Visit: Payer: Medicare Other | Admitting: Family Medicine

## 2015-04-26 ENCOUNTER — Other Ambulatory Visit: Payer: Self-pay | Admitting: Family Medicine

## 2015-05-17 ENCOUNTER — Other Ambulatory Visit: Payer: Self-pay | Admitting: Family Medicine

## 2015-05-17 ENCOUNTER — Other Ambulatory Visit: Payer: Self-pay

## 2015-05-17 MED ORDER — SIMVASTATIN 20 MG PO TABS: 20.0000 mg | ORAL_TABLET | Freq: Every day | ORAL | Status: DC

## 2015-05-22 ENCOUNTER — Other Ambulatory Visit: Payer: Self-pay | Admitting: Family Medicine

## 2015-06-06 ENCOUNTER — Other Ambulatory Visit: Payer: Medicare Other

## 2015-06-07 ENCOUNTER — Other Ambulatory Visit: Payer: Non-veteran care

## 2015-06-07 IMAGING — CR DG CHEST 2V
2 series · 2 of 2 positions shown · non-contrast
Comparison: 02/20/2013.

CLINICAL DATA: Cough.

EXAM:
CHEST  2 VIEW

[view not recorded (1 of 2)]
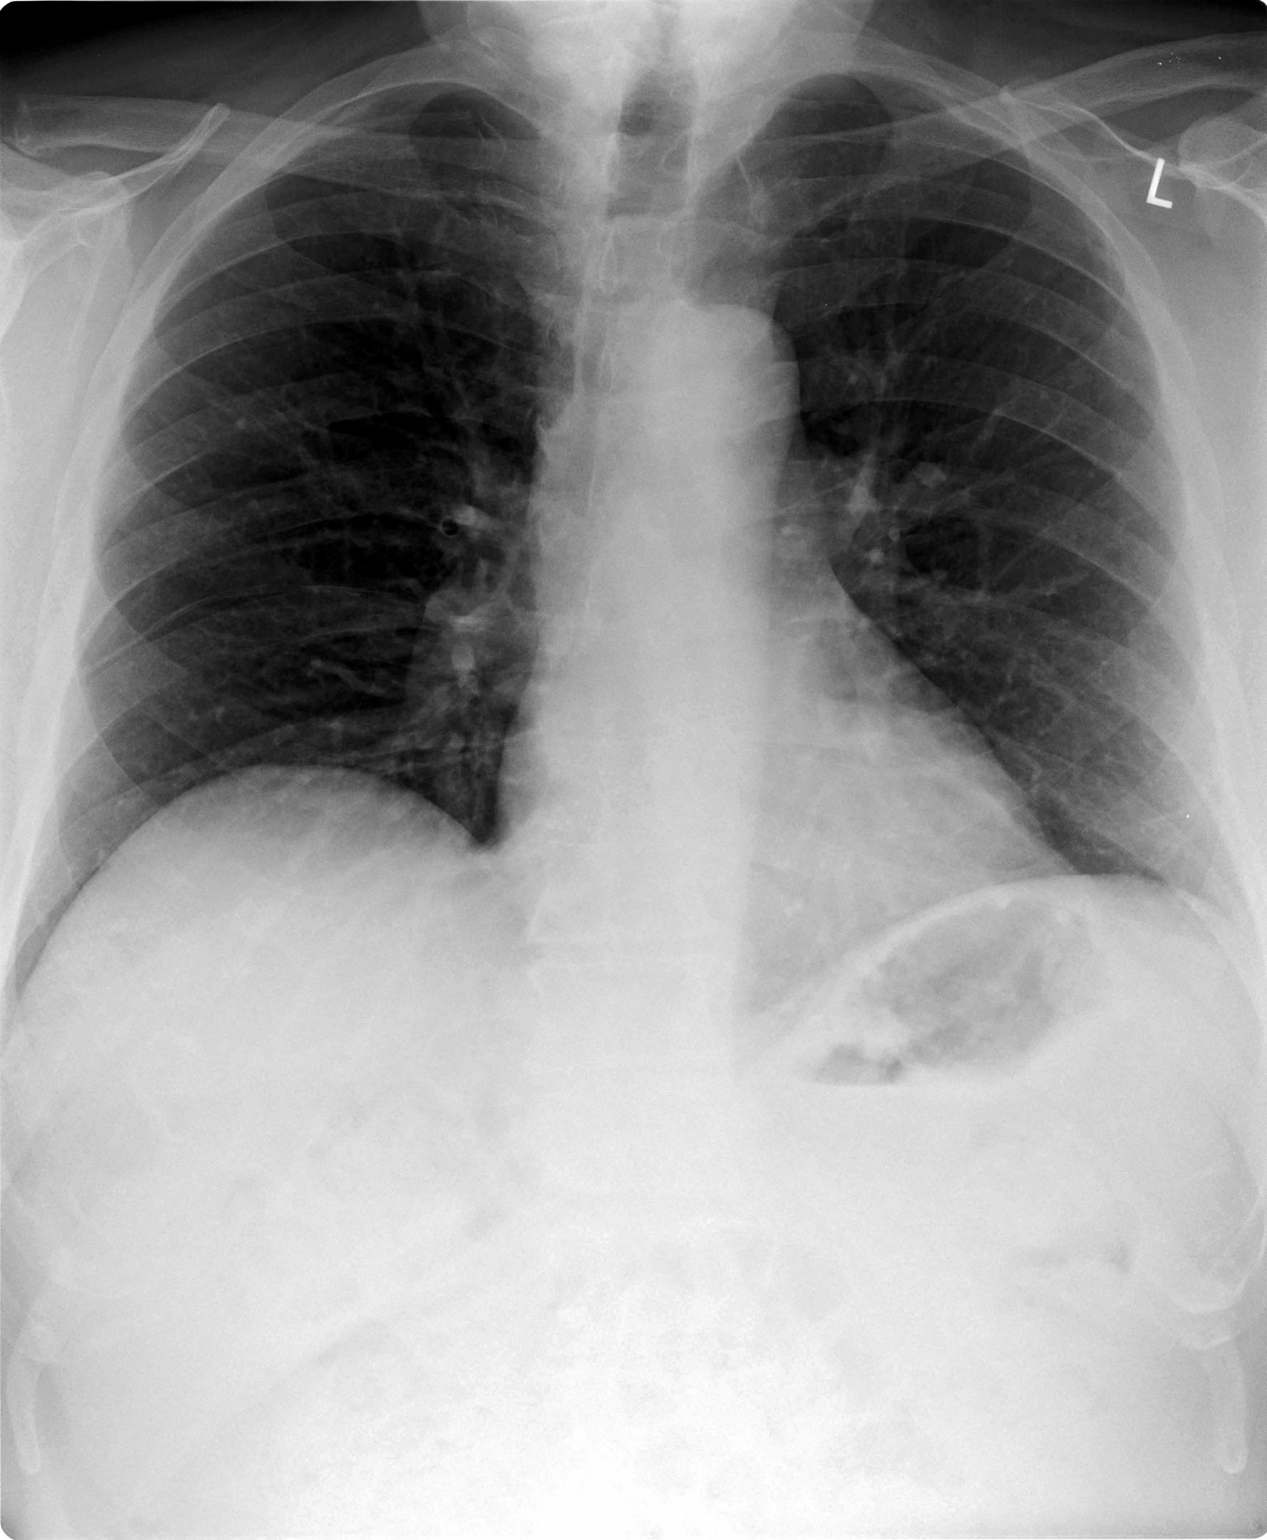

[view not recorded (2 of 2)]
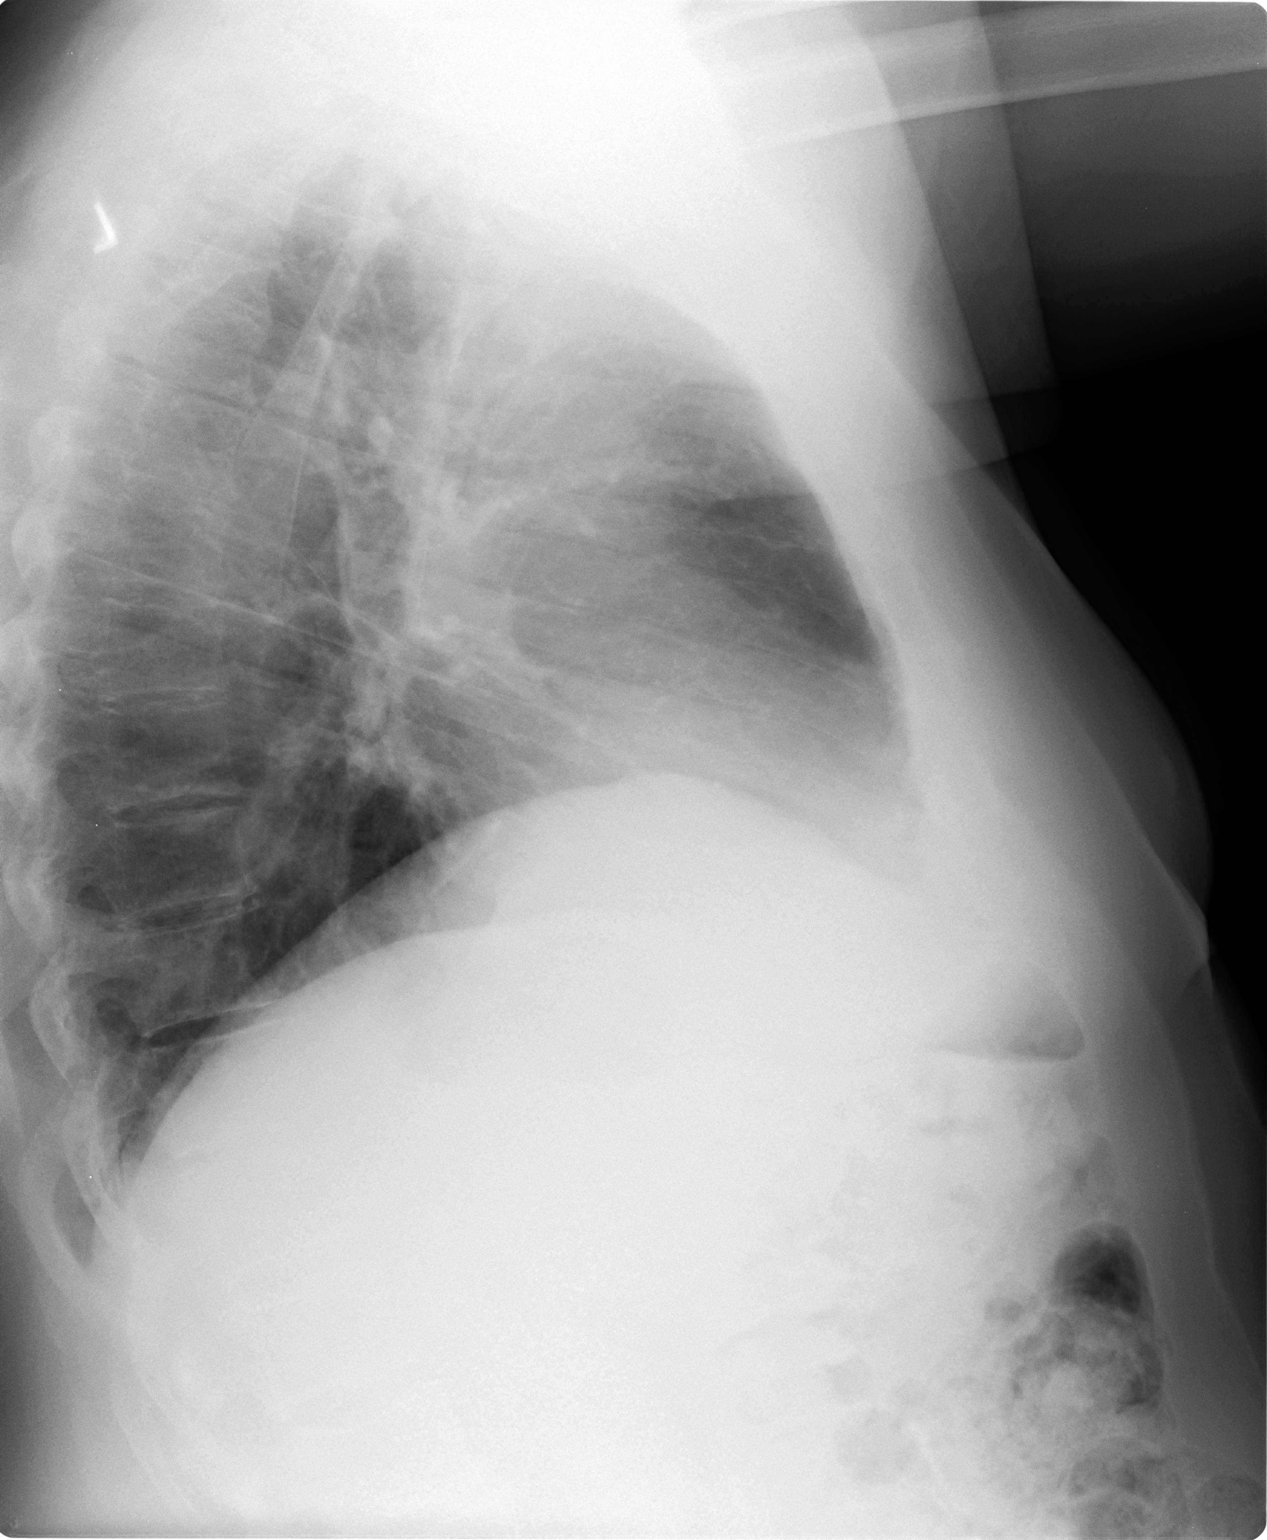

[2 of 2 positions shown; findings below may reference images not displayed]

FINDINGS: Mediastinum and hilar structures are normal. The lungs are clear.
Cardiomegaly with normal pulmonary vascularity. No pleural effusion
or pneumothorax. No acute bony abnormality.
IMPRESSION: No acute cardiopulmonary disease.

## 2015-06-08 ENCOUNTER — Telehealth: Payer: Self-pay | Admitting: Family Medicine

## 2015-06-16 LAB — HM DIABETES EYE EXAM

## 2015-06-20 ENCOUNTER — Other Ambulatory Visit: Payer: Self-pay | Admitting: Family Medicine

## 2015-06-20 NOTE — Telephone Encounter (Signed)
Last filled 03/23/15, last seen 04/12/15. Call in at [place]

## 2015-07-05 ENCOUNTER — Other Ambulatory Visit: Payer: Self-pay | Admitting: Family Medicine

## 2015-07-11 ENCOUNTER — Other Ambulatory Visit: Payer: Self-pay | Admitting: Family Medicine

## 2015-07-13 ENCOUNTER — Other Ambulatory Visit: Payer: Self-pay | Admitting: Family Medicine

## 2015-07-13 DIAGNOSIS — I6523 Occlusion and stenosis of bilateral carotid arteries: Secondary | ICD-10-CM

## 2015-07-14 ENCOUNTER — Telehealth: Payer: Self-pay | Admitting: Family Medicine

## 2015-07-31 ENCOUNTER — Inpatient Hospital Stay (HOSPITAL_COMMUNITY): Admission: RE | Admit: 2015-07-31 | Payer: Non-veteran care | Source: Ambulatory Visit

## 2015-08-01 ENCOUNTER — Ambulatory Visit (HOSPITAL_COMMUNITY)
Admission: RE | Admit: 2015-08-01 | Discharge: 2015-08-01 | Disposition: A | Discharge status: Home / Self Care | Payer: Medicare Other | Source: Ambulatory Visit | Attending: Internal Medicine | Admitting: Internal Medicine

## 2015-08-01 DIAGNOSIS — E785 Hyperlipidemia, unspecified: Secondary | ICD-10-CM | POA: Insufficient documentation

## 2015-08-01 DIAGNOSIS — I6523 Occlusion and stenosis of bilateral carotid arteries: Secondary | ICD-10-CM | POA: Diagnosis not present

## 2015-08-01 DIAGNOSIS — E119 Type 2 diabetes mellitus without complications: Secondary | ICD-10-CM | POA: Insufficient documentation

## 2015-08-02 ENCOUNTER — Other Ambulatory Visit: Payer: Self-pay | Admitting: Family Medicine

## 2015-08-03 ENCOUNTER — Telehealth: Payer: Self-pay

## 2015-08-03 NOTE — Telephone Encounter (Signed)
Pt aware of doppler results and put on reminder list

## 2015-08-03 NOTE — Telephone Encounter (Signed)
[facility] to x-ray 

## 2015-08-10 ENCOUNTER — Other Ambulatory Visit: Payer: Self-pay | Admitting: Family Medicine

## 2015-08-24 ENCOUNTER — Encounter: Payer: Self-pay | Admitting: Family Medicine

## 2015-08-24 ENCOUNTER — Ambulatory Visit (INDEPENDENT_AMBULATORY_CARE_PROVIDER_SITE_OTHER): Payer: Medicare Other | Admitting: Family Medicine

## 2015-08-24 VITALS — BP 103/65 | HR 56 | Temp 98.2°F | Ht 72.5 in | Wt 259.2 lb

## 2015-08-24 DIAGNOSIS — E118 Type 2 diabetes mellitus with unspecified complications: Secondary | ICD-10-CM | POA: Diagnosis not present

## 2015-08-24 DIAGNOSIS — I10 Essential (primary) hypertension: Secondary | ICD-10-CM | POA: Diagnosis not present

## 2015-08-24 DIAGNOSIS — B079 Viral wart, unspecified: Secondary | ICD-10-CM | POA: Diagnosis not present

## 2015-08-24 DIAGNOSIS — E669 Obesity, unspecified: Secondary | ICD-10-CM | POA: Diagnosis not present

## 2015-08-24 DIAGNOSIS — I6529 Occlusion and stenosis of unspecified carotid artery: Secondary | ICD-10-CM | POA: Diagnosis not present

## 2015-08-24 DIAGNOSIS — Z8639 Personal history of other endocrine, nutritional and metabolic disease: Secondary | ICD-10-CM

## 2015-08-24 DIAGNOSIS — E559 Vitamin D deficiency, unspecified: Secondary | ICD-10-CM | POA: Diagnosis not present

## 2015-08-24 DIAGNOSIS — E785 Hyperlipidemia, unspecified: Secondary | ICD-10-CM | POA: Diagnosis not present

## 2015-08-24 LAB — POCT GLYCOSYLATED HEMOGLOBIN (HGB A1C): HEMOGLOBIN A1C: 5.8

## 2015-08-24 NOTE — Patient Instructions (Addendum)
Continue current medications. Continue good therapeutic lifestyle changes which include good diet and exercise. Fall precautions discussed with patient. If an FOBT was given today- please return it to our front desk. If you are over 76 years old - you may need Prevnar 62 or the adult Pneumonia vaccine.  Flu Shots will be available at our office starting mid- September. Please call and schedule a FLU CLINIC APPOINTMENT.                        Medicare Annual Wellness Visit  [facility] and the medical providers at [facility] strive to bring you the best medical care.  In doing so we not only want to address your current medical conditions and concerns but also to detect new conditions early and prevent illness, disease and health-related problems.    Medicare offers a yearly Wellness Visit which allows our clinical staff to assess your need for preventative services including immunizations, lifestyle education, counseling to decrease risk of preventable diseases and screening for fall risk and other medical concerns.    This visit is provided free of charge (no copay) for all Medicare recipients. The clinical pharmacists at [facility] have begun to conduct these Wellness Visits which will also include a thorough review of all your medications.    As you primary medical provider recommend that you make an appointment for your Annual Wellness Visit if you have not done so already this year.  You may set up this appointment before you leave today or you may call back [phone] and schedule an appointment.  Please make sure when you call that you mention that you are scheduling your Annual Wellness Visit with the clinical pharmacist so that the appointment may be made for the proper length of time.    Continue to work on weight loss. This is very important. It will be good for your blood sugar your cholesterol and your body structure. Continue to  follow-up with orthopedic surgeon Continue to follow-up with cardiology as needed Always work at reducing the risk of falls and injuries and be careful with any climbing

## 2015-08-24 NOTE — Progress Notes (Signed)
Subjective:    Patient ID: Cristian Moore, male    DOB: 1940/11/13, 75 y.o.   MRN: [id number]  HPI Patient is here today for a 4 month follow up. Patient has no other complaints today. The patient has been taking his medications correctly. He does not take the PPI regularly. He is due to get blood work today and to be given an FOBT to return. He does bring in a few blood sugars for review and these are good. His weight from the previous visit has not changed and it is still 259. His BMI is 34.6. The patient goes to the [facility] in [place]. He had an eye exam sometime in late December or early January there. He brings in blood sugars for review and these were good. He has had recent carotid Dopplers by the cardiologist. He is due to get the lab work today. He denies any shortness of breath other than what he thinks is due to being out of shape. He has a little chest pain that was relieved by Tums. This was not activity related. The shortness of breath is worse with climbing. He has rare heartburn and does not take anything for this on a regular basis. He has omeprazole and sucralfate at home. He does have some urinary frequency. He admits to not getting enough exercise. He also continues to complain of low back pain and is being followed by the orthopedic surgeon. He has a wart on his left neck and would like to have cryotherapy on this.   Review of Systems  Constitutional: Negative.   HENT: Negative.   Eyes: Negative.   Respiratory: Negative.   Cardiovascular: Negative.   Gastrointestinal: Negative.   Endocrine: Negative.   Genitourinary: Negative.   Musculoskeletal: Negative.   Skin: Negative.   Allergic/Immunologic: Negative.   Neurological: Negative.   Hematological: Negative.   Psychiatric/Behavioral: Negative.           Patient Active Problem List   Diagnosis Date Noted  . Chest pain 03/20/2015  . Chest tightness 03/19/2015  . Numbness of left hand 03/19/2015  . Insomnia  07/19/2014  . Vitamin D deficiency 07/19/2014  . BPH (benign prostatic hyperplasia) 07/19/2014  . DM (diabetes mellitus), type 2 with complications ([facility]) 41/28/7867  . Acid reflux 06/03/2013  . 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 Personal history of other diseases of circulatory system 06/03/2013  . Loss of peripheral visual field 05/18/2013  . Non-Obstructive CAD 03/05/2013  . Essential hypertension 02/22/2013  . Shortness of breath   . Obstructive sleep apnea   . Diverticulitis, recurrent, s/p lap sigmoid colectomy 07/23/2012 06/02/2012  . Carotid artery stenosis 09/28/2009  . Obesity (BMI 30-39.9) 08/02/2009  . HLD (hyperlipidemia) 08/01/2009   Outpatient Encounter Prescriptions as of 08/24/2015  Medication Sig  . ALPRAZolam (XANAX) 0.25 MG tablet 1/2 tab BID PRN for anxiety  . aspirin 325 MG tablet Take 1 tablet (325 mg total) by mouth daily.  . cholecalciferol (VITAMIN D) 1000 UNITS tablet Take 2,000 Units by mouth daily.   . Coenzyme Q10 (CO Q-10) 100 MG CAPS Take 100 mg by mouth daily.  Marland Kitchen escitalopram (LEXAPRO) 10 MG tablet TAKE ONE TABLET BY MOUTH ONCE DAILY AS DIRECTED  . fluticasone (FLONASE) 50 MCG/ACT nasal spray Use 2 sprays in each  nostril daily  . glucose blood (ONETOUCH VERIO) test strip Check BS BID and PRN. Dx.250.0  . metFORMIN (GLUCOPHAGE-XR) 500 MG 24 hr tablet Take 1 tablet by mouth  daily with breakfast  .  metoprolol (LOPRESSOR) 50 MG tablet Take one-half tablet by  mouth twice a day  . Multiple Vitamins-Minerals (CENTRUM SILVER PO) Take 1 tablet by mouth daily.  Marland Kitchen NITROSTAT 0.4 MG SL tablet DISSOLVE ONE TABLET UNDER THE TONGUE EVERY 5 MINUTES AS NEEDED FOR CHEST PAIN.  DO NOT EXCEED A TOTAL OF 3 DOSES IN 15 MINUTES  . omeprazole (PRILOSEC) 20 MG capsule Take 1 capsule by mouth  twice daily before meals  . ONETOUCH DELICA LANCETS 27O MISC Check BS BID and PRN. DX. E11.8  . PRESCRIPTION MEDICATION Inhale 1 application into the lungs at bedtime. "CPAP" MACHINE  .  simvastatin (ZOCOR) 20 MG tablet Take 1 tablet by mouth at  bedtime  . sodium chloride (OCEAN) 0.65 % nasal spray Place 1 spray into the nose as needed for congestion.  . sucralfate (CARAFATE) 1 g tablet Take 1 tablet by mouth  twice a day  . zolpidem (AMBIEN) 10 MG tablet TAKE ONE TABLET AT BEDTIME  . tamsulosin (FLOMAX) 0.4 MG CAPS capsule Take 1 capsule by mouth at  bedtime   No facility-administered encounter medications on file as of 08/24/2015.    Objective:   Physical Exam  Constitutional: He is oriented to person, place, and time. He appears well-developed and well-nourished. No distress.  HENT:  Head: Normocephalic and atraumatic.  Right Ear: External ear normal.  Left Ear: External ear normal.  Nose: Nose normal.  Mouth/Throat: Oropharynx is clear and moist. No oropharyngeal exudate.  Eyes: Conjunctivae and EOM are normal. Pupils are equal, round, and reactive to light. Right eye exhibits no discharge. Left eye exhibits no discharge. No scleral icterus.  The patient has had a recent eye exam. This was done at the [facility].  Neck: Normal range of motion. Neck supple. No thyromegaly present.  Cardiovascular: Normal rate, regular rhythm and intact distal pulses.   No murmur heard. The heart has a regular rate and rhythm at 60/m  Pulmonary/Chest: Effort normal and breath sounds normal. No respiratory distress. He has no wheezes. He has no rales. He exhibits no tenderness.  Clear anteriorly and posteriorly  Abdominal: Soft. Bowel sounds are normal. He exhibits no mass. There is no tenderness. There is no rebound and no guarding.  Obesity without liver or spleen enlargement and no inguinal adenopathy and no bruits  Musculoskeletal: Normal range of motion. He exhibits no edema.  Lymphadenopathy:    He has no cervical adenopathy.  Neurological: He is alert and oriented to person, place, and time. He has normal reflexes. No cranial nerve deficit.  Skin: Skin is warm and dry. No rash noted.    There is a wart above the left shoulder which cryotherapy was done without problem  Psychiatric: He has a normal mood and affect. His behavior is normal. Judgment and thought content normal.  Nursing note and vitals reviewed.  BP 103/65 mmHg  Pulse 56  Temp(Src) 98.2 F (36.8 C) (Oral)  Ht 6' 0.5" (1.842 m)  Wt 259 lb 3.2 oz (117.572 kg)  BMI 34.65 kg/m2        Assessment & Plan:  1. Type 2 diabetes mellitus with complication, without long-term current use of insulin (HCC) -Try to monitor blood sugars more regularly and bring these readings in for review at the next visit - POCT glycosylated hemoglobin (Hb A1C) - NMR, lipoprofile - Microalbumin / creatinine urine ratio  2. Essential hypertension -Blood pressure is good today he will continue with current treatment - BMP8+EGFR - NMR, lipoprofile - Hepatic function  panel  3. Vitamin D deficiency -Continue to take vitamin D regularly and any change in current dose of be dependent upon lab work being done today. - VITAMIN D 25 Hydroxy (Vit-D Deficiency, Fractures)  4. History of thyroiditis -No problem with thyroid. - Thyroid Panel With TSH  5. Obesity (BMI 30-39.9) -Continue aggressively to lose weight through diet and exercise  6. HLD (hyperlipidemia) -Continue simvastatin and aggressive therapeutic lifestyle changes  7. Carotid artery stenosis, unspecified laterality -Continue periodic follow-up with cardiology to get this procedure done  8. Cutaneous wart -Cryotherapy without problems  No orders of the defined types were placed in this encounter.   Patient Instructions  Continue current medications. Continue good therapeutic lifestyle changes which include good diet and exercise. Fall precautions discussed with patient. If an FOBT was given today- please return it to our front desk. If you are over 21 years old - you may need Prevnar 2 or the adult Pneumonia vaccine.  Flu Shots will be available at our  office starting mid- September. Please call and schedule a FLU CLINIC APPOINTMENT.                        Medicare Annual Wellness Visit  [facility] and the medical providers at [facility] strive to bring you the best medical care.  In doing so we not only want to address your current medical conditions and concerns but also to detect new conditions early and prevent illness, disease and health-related problems.    Medicare offers a yearly Wellness Visit which allows our clinical staff to assess your need for preventative services including immunizations, lifestyle education, counseling to decrease risk of preventable diseases and screening for fall risk and other medical concerns.    This visit is provided free of charge (no copay) for all Medicare recipients. The clinical pharmacists at [facility] have begun to conduct these Wellness Visits which will also include a thorough review of all your medications.    As you primary medical provider recommend that you make an appointment for your Annual Wellness Visit if you have not done so already this year.  You may set up this appointment before you leave today or you may call back ([phone]) and schedule an appointment.  Please make sure when you call that you mention that you are scheduling your Annual Wellness Visit with the clinical pharmacist so that the appointment may be made for the proper length of time.    Continue to work on weight loss. This is very important. It will be good for your blood sugar your cholesterol and your body structure. Continue to follow-up with orthopedic surgeon Continue to follow-up with cardiology as needed Always work at reducing the risk of falls and injuries and be careful with any climbing   Arrie Senate MD

## 2015-08-25 LAB — NMR, LIPOPROFILE
CHOLESTEROL: 139 mg/dL (ref 100–199)
HDL Cholesterol by NMR: 48 mg/dL (ref >39)
HDL PARTICLE NUMBER: 35.5 umol/L (ref >=30.5)
LDL PARTICLE NUMBER: 868 nmol/L (ref <1000)
LDL SIZE: 20.5 nm (ref >20.5)
LDL-C: 59 mg/dL (ref 0–99)
LP-IR SCORE: 63 — AB (ref <=45)
Small LDL Particle Number: 388 nmol/L (ref <=527)
Triglycerides by NMR: 158 mg/dL — ABNORMAL HIGH (ref 0–149)

## 2015-08-25 LAB — THYROID PANEL WITH TSH
Free Thyroxine Index: 1.4 (ref 1.2–4.9)
T3 Uptake Ratio: 28 % (ref 24–39)
T4, Total: 4.9 ug/dL (ref 4.5–12.0)
TSH: 0.859 u[IU]/mL (ref 0.450–4.500)

## 2015-08-25 LAB — HEPATIC FUNCTION PANEL
ALT: 20 IU/L (ref 0–44)
AST: 25 IU/L (ref 0–40)
Albumin: 4.4 g/dL (ref 3.5–4.8)
Alkaline Phosphatase: 37 IU/L — ABNORMAL LOW (ref 39–117)
BILIRUBIN TOTAL: 0.5 mg/dL (ref 0.0–1.2)
Bilirubin, Direct: 0.16 mg/dL (ref 0.00–0.40)
Total Protein: 6.4 g/dL (ref 6.0–8.5)

## 2015-08-25 LAB — BMP8+EGFR
BUN / CREAT RATIO: 14 (ref 10–22)
BUN: 14 mg/dL (ref 8–27)
CO2: 25 mmol/L (ref 18–29)
CREATININE: 1.03 mg/dL (ref 0.76–1.27)
Calcium: 9.4 mg/dL (ref 8.6–10.2)
Chloride: 101 mmol/L (ref 96–106)
GFR calc Af Amer: 82 mL/min/{1.73_m2} (ref >59)
GFR calc non Af Amer: 71 mL/min/{1.73_m2} (ref >59)
GLUCOSE: 105 mg/dL — AB (ref 65–99)
Potassium: 4.7 mmol/L (ref 3.5–5.2)
SODIUM: 141 mmol/L (ref 134–144)

## 2015-08-25 LAB — MICROALBUMIN / CREATININE URINE RATIO
CREATININE, UR: 106.2 mg/dL
MICROALB/CREAT RATIO: 6 mg/g{creat} (ref 0.0–30.0)
MICROALBUM., U, RANDOM: 6.4 ug/mL

## 2015-08-25 LAB — VITAMIN D 25 HYDROXY (VIT D DEFICIENCY, FRACTURES): VIT D 25 HYDROXY: 43.9 ng/mL (ref 30.0–100.0)

## 2015-08-30 ENCOUNTER — Other Ambulatory Visit: Payer: Medicare Other

## 2015-08-30 DIAGNOSIS — Z1212 Encounter for screening for malignant neoplasm of rectum: Secondary | ICD-10-CM | POA: Diagnosis not present

## 2015-09-01 ENCOUNTER — Telehealth: Payer: Self-pay | Admitting: *Deleted

## 2015-09-01 DIAGNOSIS — R195 Other fecal abnormalities: Secondary | ICD-10-CM

## 2015-09-01 LAB — FECAL OCCULT BLOOD, IMMUNOCHEMICAL: FECAL OCCULT BLD: POSITIVE — AB

## 2015-09-01 NOTE — Telephone Encounter (Signed)
Pt notified of results Verbalizes understanding Order entered for future labs

## 2015-09-01 NOTE — Telephone Encounter (Signed)
-----   Message from Chipper Herb, MD sent at 09/01/2015  4:24 PM EST ----- The FOBT is positive. Please wait 2 weeks and repeat the FOBT and get another CBC

## 2015-09-05 DIAGNOSIS — M5416 Radiculopathy, lumbar region: Secondary | ICD-10-CM | POA: Diagnosis not present

## 2015-09-11 ENCOUNTER — Other Ambulatory Visit: Payer: Self-pay | Admitting: Family Medicine

## 2015-09-13 ENCOUNTER — Other Ambulatory Visit: Payer: Self-pay | Admitting: Family Medicine

## 2015-09-13 ENCOUNTER — Other Ambulatory Visit: Payer: Medicare Other

## 2015-09-13 DIAGNOSIS — R195 Other fecal abnormalities: Secondary | ICD-10-CM

## 2015-09-13 NOTE — Telephone Encounter (Signed)
Last seen 08/24/15  [facility]  If approved route to nurse to call into [facility]

## 2015-09-14 LAB — CBC WITH DIFFERENTIAL/PLATELET
BASOS ABS: 0.1 10*3/uL (ref 0.0–0.2)
Basos: 1 %
EOS (ABSOLUTE): 0.3 10*3/uL (ref 0.0–0.4)
Eos: 3 %
Hematocrit: 44.7 % (ref 37.5–51.0)
Hemoglobin: 15.3 g/dL (ref 12.6–17.7)
IMMATURE GRANULOCYTES: 1 %
Immature Grans (Abs): 0.1 10*3/uL (ref 0.0–0.1)
LYMPHS ABS: 2.2 10*3/uL (ref 0.7–3.1)
Lymphs: 26 %
MCH: 31.9 pg (ref 26.6–33.0)
MCHC: 34.2 g/dL (ref 31.5–35.7)
MCV: 93 fL (ref 79–97)
MONOCYTES: 14 %
MONOS ABS: 1.2 10*3/uL — AB (ref 0.1–0.9)
NEUTROS PCT: 55 %
Neutrophils Absolute: 4.6 10*3/uL (ref 1.4–7.0)
Platelets: 283 10*3/uL (ref 150–379)
RBC: 4.79 x10E6/uL (ref 4.14–5.80)
RDW: 13.8 % (ref 12.3–15.4)
WBC: 8.4 10*3/uL (ref 3.4–10.8)

## 2015-09-15 ENCOUNTER — Telehealth: Payer: Self-pay | Admitting: Family Medicine

## 2015-09-15 NOTE — Telephone Encounter (Signed)
Patient aware of results.

## 2015-09-18 ENCOUNTER — Other Ambulatory Visit: Payer: Medicare Other

## 2015-09-18 DIAGNOSIS — R195 Other fecal abnormalities: Secondary | ICD-10-CM | POA: Diagnosis not present

## 2015-09-18 NOTE — Addendum Note (Signed)
Addended by: Liliane Bade on: 09/18/2015 11:48 AM   Modules accepted: Orders

## 2015-09-20 LAB — FECAL OCCULT BLOOD, IMMUNOCHEMICAL: Fecal Occult Bld: NEGATIVE

## 2015-09-21 ENCOUNTER — Other Ambulatory Visit: Payer: Self-pay | Admitting: Family Medicine

## 2015-10-16 ENCOUNTER — Encounter: Payer: Self-pay | Admitting: Family Medicine

## 2015-10-16 ENCOUNTER — Ambulatory Visit (INDEPENDENT_AMBULATORY_CARE_PROVIDER_SITE_OTHER): Payer: Medicare Other | Admitting: Family Medicine

## 2015-10-16 VITALS — BP 129/72 | HR 57 | Temp 99.7°F | Ht 72.5 in | Wt 264.4 lb

## 2015-10-16 DIAGNOSIS — J209 Acute bronchitis, unspecified: Secondary | ICD-10-CM

## 2015-10-16 MED ORDER — BENZONATATE 200 MG PO CAPS: 200.0000 mg | ORAL_CAPSULE | Freq: Three times a day (TID) | ORAL | Status: DC | PRN

## 2015-10-16 MED ORDER — AZITHROMYCIN 250 MG PO TABS: ORAL_TABLET | ORAL | Status: DC

## 2015-10-16 NOTE — Progress Notes (Signed)
BP 129/72 mmHg  Pulse 57  Temp(Src) 99.7 F (37.6 C) (Oral)  Ht 6' 0.5" (1.842 m)  Wt 264 lb 6.4 oz (119.931 kg)  BMI 35.35 kg/m2   Subjective:    Patient ID: Cristian Moore, male    DOB: [birth year], 75 y.o.   MRN: [id number]  HPI: BENTLY WILMORE is a 75 y.o. male presenting on 10/16/2015 for Sinusitis; Cough; and Hoarse   HPI Cough and sinus congestion and hoarseness and chest congestion Patient has been having chest congestion and cough and sinus congestion and postnasal drainage that is been worsening over the past 4 or 5 days. He has been using Flonase and needs Sudafed and needs some leftover Tessalon Perles that he had from previous. He also has been using Tylenol Sinus. He has Mucinex but has not used it yet. He denies any fevers or chills or shortness of breath or wheezing.  Relevant past medical, surgical, family and social history reviewed and updated as indicated. Interim medical history since our last visit reviewed. Allergies and medications reviewed and updated.  Review of Systems  Constitutional: Negative for fever and chills.  HENT: Positive for congestion, postnasal drip, rhinorrhea, sinus pressure, sneezing and sore throat. Negative for ear discharge, ear pain and voice change.   Eyes: Negative for pain, discharge, redness and visual disturbance.  Respiratory: Positive for cough. Negative for shortness of breath and wheezing.   Cardiovascular: Negative for chest pain and leg swelling.  Gastrointestinal: Negative for abdominal pain, diarrhea and constipation.  Genitourinary: Negative for difficulty urinating.  Musculoskeletal: Negative for back pain and gait problem.  Skin: Negative for rash.  Neurological: Negative for syncope, light-headedness and headaches.  All other systems reviewed and are negative.   Per HPI unless specifically indicated above     Medication List       This list is accurate as of: 10/16/15 12:09 PM.  Always use your most recent  med list.               ALPRAZolam 0.25 MG tablet  Commonly known as:  XANAX  1/2 tab BID PRN for anxiety     aspirin 325 MG tablet  Take 1 tablet (325 mg total) by mouth daily.     azithromycin 250 MG tablet  Commonly known as:  ZITHROMAX  Take 2 the first day and then one each day after.     benzonatate 200 MG capsule  Commonly known as:  TESSALON  Take 1 capsule (200 mg total) by mouth 3 (three) times daily as needed for cough.     CENTRUM SILVER PO  Take 1 tablet by mouth daily.     cholecalciferol 1000 units tablet  Commonly known as:  VITAMIN D  Take 2,000 Units by mouth daily.     Co Q-10 100 MG Caps  Take 100 mg by mouth daily.     escitalopram 10 MG tablet  Commonly known as:  LEXAPRO  TAKE ONE TABLET BY MOUTH ONCE DAILY AS DIRECTED     fluticasone 50 MCG/ACT nasal spray  Commonly known as:  FLONASE  Use 2 sprays in each  nostril daily     glucose blood test strip  Commonly known as:  ONETOUCH VERIO  Check BS BID and PRN. Dx.250.0     metFORMIN 500 MG 24 hr tablet  Commonly known as:  GLUCOPHAGE-XR  Take 1 tablet by mouth  daily with breakfast     metoprolol 50 MG tablet  Commonly known  as:  LOPRESSOR  Take one-half tablet by  mouth twice a day     NITROSTAT 0.4 MG SL tablet  Generic drug:  nitroGLYCERIN  DISSOLVE ONE TABLET UNDER THE TONGUE EVERY 5 MINUTES AS NEEDED FOR CHEST PAIN.  DO NOT EXCEED A TOTAL OF 3 DOSES IN 15 MINUTES     omeprazole 20 MG capsule  Commonly known as:  PRILOSEC  Take 1 capsule by mouth  twice daily before meals     ONETOUCH DELICA LANCETS 99991111 Misc  Check BS BID and PRN. DX. E11.8     PRESCRIPTION MEDICATION  Inhale 1 application into the lungs at bedtime. "CPAP" MACHINE     simvastatin 20 MG tablet  Commonly known as:  ZOCOR  Take 1 tablet by mouth at  bedtime     sodium chloride 0.65 % nasal spray  Commonly known as:  OCEAN  Place 1 spray into the nose as needed for congestion.     sucralfate 1 g tablet    Commonly known as:  CARAFATE  Take 1 tablet by mouth  twice a day     tamsulosin 0.4 MG Caps capsule  Commonly known as:  FLOMAX  Take 1 capsule by mouth at  bedtime     zolpidem 10 MG tablet  Commonly known as:  AMBIEN  TAKE ONE TABLET AT BEDTIME           Objective:    BP 129/72 mmHg  Pulse 57  Temp(Src) 99.7 F (37.6 C) (Oral)  Ht 6' 0.5" (1.842 m)  Wt 264 lb 6.4 oz (119.931 kg)  BMI 35.35 kg/m2  Wt Readings from Last 3 Encounters:  10/16/15 264 lb 6.4 oz (119.931 kg)  08/24/15 259 lb 3.2 oz (117.572 kg)  04/12/15 259 lb (117.482 kg)    Physical Exam  Constitutional: He is oriented to person, place, and time. He appears well-developed and well-nourished. No distress.  HENT:  Right Ear: Tympanic membrane, external ear and ear canal normal.  Left Ear: Tympanic membrane, external ear and ear canal normal.  Nose: Mucosal edema and rhinorrhea present. No sinus tenderness. No epistaxis. Right sinus exhibits maxillary sinus tenderness. Right sinus exhibits no frontal sinus tenderness. Left sinus exhibits maxillary sinus tenderness. Left sinus exhibits no frontal sinus tenderness.  Mouth/Throat: Uvula is midline and mucous membranes are normal. Posterior oropharyngeal edema and posterior oropharyngeal erythema present. No oropharyngeal exudate or tonsillar abscesses.  Eyes: Conjunctivae and EOM are normal. Pupils are equal, round, and reactive to light. Right eye exhibits no discharge. No scleral icterus.  Neck: Neck supple. No thyromegaly present.  Cardiovascular: Normal rate, regular rhythm, normal heart sounds and intact distal pulses.   No murmur heard. Pulmonary/Chest: Effort normal and breath sounds normal. No respiratory distress. He has no wheezes. He has no rales.  Musculoskeletal: Normal range of motion. He exhibits no edema.  Lymphadenopathy:    He has no cervical adenopathy.  Neurological: He is alert and oriented to person, place, and time. Coordination normal.   Skin: Skin is warm and dry. No rash noted. He is not diaphoretic.  Psychiatric: He has a normal mood and affect. His behavior is normal.  Nursing note and vitals reviewed.   Results for orders placed or performed in visit on 09/18/15  Fecal occult blood, imunochemical  Result Value Ref Range   Fecal Occult Bld Negative Negative      Assessment & Plan:   Problem List Items Addressed This Visit    None  Visit Diagnoses    Acute bronchitis, unspecified organism    -  Primary    Relevant Medications    azithromycin (ZITHROMAX) 250 MG tablet    benzonatate (TESSALON) 200 MG capsule        Follow up plan: Return if symptoms worsen or fail to improve.  Counseling provided for all of the vaccine components No orders of the defined types were placed in this encounter.    Caryl Pina, MD [facility] 10/16/2015, 12:09 PM

## 2015-10-30 ENCOUNTER — Other Ambulatory Visit: Payer: Self-pay | Admitting: Family Medicine

## 2015-11-20 ENCOUNTER — Telehealth: Payer: Self-pay | Admitting: Pharmacist

## 2015-11-20 ENCOUNTER — Other Ambulatory Visit: Payer: Self-pay | Admitting: Family Medicine

## 2015-11-20 NOTE — Telephone Encounter (Signed)
Patient is wondering if he needs colonoscopy prior to 10 years from last which was in 2011.  He is currently seeing Dr Carlean Purl but in past saw Dr Earlean Shawl who checked colonoscopy q 5 years due to h/o colon adenomatous polyps.  In 2014 patient had segmental sigmoid colonic resection on left side due to severe diverticulitis.   Patient would like advice on if he should get colonoscopy sooner than 2021?

## 2015-11-22 NOTE — Telephone Encounter (Signed)
Please pass this question on to Dr. Carlean Purl regarding the patient's need for repeat colonoscopy

## 2015-11-28 NOTE — Telephone Encounter (Signed)
I am confident that my recommendations for waiting 10 years until next colonoscopy are in line with current guidelines - over time we have changed and since he has only had one small polyp removed many years ago and no polyps since his next colonoscopy is 10 years from last  Will have my RN call him to let him know

## 2015-11-28 NOTE — Telephone Encounter (Signed)
Left message for patient to call back  

## 2015-11-28 NOTE — Telephone Encounter (Signed)
Please make sure that this patient is aware of these recommendations from Dr. Carlean Purl regarding the timing of his next colonoscopy

## 2015-11-29 NOTE — Telephone Encounter (Signed)
Patient aware of Dr. Celesta Aver opinion concerning colonoscopy.

## 2015-12-18 ENCOUNTER — Other Ambulatory Visit: Payer: Self-pay | Admitting: Family Medicine

## 2015-12-20 ENCOUNTER — Other Ambulatory Visit: Payer: Self-pay

## 2015-12-20 DIAGNOSIS — L821 Other seborrheic keratosis: Secondary | ICD-10-CM | POA: Diagnosis not present

## 2015-12-20 DIAGNOSIS — L570 Actinic keratosis: Secondary | ICD-10-CM | POA: Diagnosis not present

## 2015-12-20 DIAGNOSIS — L814 Other melanin hyperpigmentation: Secondary | ICD-10-CM | POA: Diagnosis not present

## 2015-12-20 MED ORDER — METFORMIN HCL ER 500 MG PO TB24: ORAL_TABLET | ORAL | Status: DC

## 2015-12-20 MED ORDER — TAMSULOSIN HCL 0.4 MG PO CAPS: ORAL_CAPSULE | ORAL | Status: DC

## 2015-12-20 MED ORDER — ESCITALOPRAM OXALATE 10 MG PO TABS: ORAL_TABLET | ORAL | Status: DC

## 2015-12-20 MED ORDER — ALPRAZOLAM 0.25 MG PO TABS: ORAL_TABLET | ORAL | Status: DC

## 2015-12-20 NOTE — Telephone Encounter (Signed)
Last seen 10/16/15 Dr Dettinger  This is for mail order  If approved print Alprazolam

## 2015-12-20 NOTE — Telephone Encounter (Signed)
Defer to PCP  Laroy Apple, MD [facility] 12/20/2015, 3:56 PM

## 2015-12-21 NOTE — Telephone Encounter (Signed)
Alprazolam refill called to [facility]

## 2015-12-22 ENCOUNTER — Other Ambulatory Visit: Payer: Self-pay

## 2015-12-22 MED ORDER — ALPRAZOLAM 0.25 MG PO TABS: ORAL_TABLET | ORAL | Status: DC

## 2015-12-22 NOTE — Telephone Encounter (Signed)
Please print for [facility] For mail order

## 2016-01-03 ENCOUNTER — Ambulatory Visit (INDEPENDENT_AMBULATORY_CARE_PROVIDER_SITE_OTHER): Payer: Medicare Other

## 2016-01-03 ENCOUNTER — Ambulatory Visit (INDEPENDENT_AMBULATORY_CARE_PROVIDER_SITE_OTHER): Payer: Medicare Other | Admitting: Family Medicine

## 2016-01-03 ENCOUNTER — Encounter: Payer: Self-pay | Admitting: Family Medicine

## 2016-01-03 VITALS — BP 119/77 | HR 60 | Temp 97.3°F | Ht 72.5 in | Wt 264.0 lb

## 2016-01-03 DIAGNOSIS — E118 Type 2 diabetes mellitus with unspecified complications: Secondary | ICD-10-CM

## 2016-01-03 DIAGNOSIS — Z0000 Encounter for general adult medical examination without abnormal findings: Secondary | ICD-10-CM

## 2016-01-03 DIAGNOSIS — I10 Essential (primary) hypertension: Secondary | ICD-10-CM

## 2016-01-03 DIAGNOSIS — E559 Vitamin D deficiency, unspecified: Secondary | ICD-10-CM | POA: Diagnosis not present

## 2016-01-03 DIAGNOSIS — N400 Enlarged prostate without lower urinary tract symptoms: Secondary | ICD-10-CM | POA: Diagnosis not present

## 2016-01-03 DIAGNOSIS — W57XXXA Bitten or stung by nonvenomous insect and other nonvenomous arthropods, initial encounter: Secondary | ICD-10-CM

## 2016-01-03 DIAGNOSIS — R0602 Shortness of breath: Secondary | ICD-10-CM | POA: Diagnosis not present

## 2016-01-03 DIAGNOSIS — E785 Hyperlipidemia, unspecified: Secondary | ICD-10-CM | POA: Diagnosis not present

## 2016-01-03 DIAGNOSIS — M7711 Lateral epicondylitis, right elbow: Secondary | ICD-10-CM

## 2016-01-03 DIAGNOSIS — S80861A Insect bite (nonvenomous), right lower leg, initial encounter: Secondary | ICD-10-CM

## 2016-01-03 DIAGNOSIS — R5381 Other malaise: Secondary | ICD-10-CM

## 2016-01-03 LAB — MICROSCOPIC EXAMINATION
Epithelial Cells (non renal): NONE SEEN /hpf (ref 0–10)
RBC, UA: NONE SEEN /hpf (ref 0–?)
WBC, UA: NONE SEEN /hpf (ref 0–?)

## 2016-01-03 LAB — URINALYSIS, COMPLETE
BILIRUBIN UA: NEGATIVE
Glucose, UA: NEGATIVE
KETONES UA: NEGATIVE
Leukocytes, UA: NEGATIVE
NITRITE UA: NEGATIVE
Protein, UA: NEGATIVE
RBC UA: NEGATIVE
SPEC GRAV UA: 1.02 (ref 1.005–1.030)
Urobilinogen, Ur: 0.2 mg/dL (ref 0.2–1.0)
pH, UA: 5 (ref 5.0–7.5)

## 2016-01-03 LAB — BAYER DCA HB A1C WAIVED: HB A1C (BAYER DCA - WAIVED): 5.8 % (ref <7.0)

## 2016-01-03 MED ORDER — DOXYCYCLINE HYCLATE 100 MG PO TABS: 100.0000 mg | ORAL_TABLET | Freq: Two times a day (BID) | ORAL | Status: DC

## 2016-01-03 NOTE — Progress Notes (Signed)
Subjective:    Patient ID: Cristian Moore, male    DOB: 06-10-1941, 75 y.o.   MRN: [id number]  HPI Patient is here today for annual wellness exam and follow up of chronic medical problems which includes diabetes and hyperlipidemia. He is taking medications regularly.He does complain of some shortness of breath with exertion. It is of note that he had a chemical stress test back in the fall and this was within normal limits. He also complains of some arthralgias in his right shoulder elbow and wrist. A pulse ox done on him when he was put into the exam room was 97%. A chest x-ray done on him in September 2016 showed atelectasis and scarring at the left lung base but no acute process. The patient denies any chest pain. He does have shortness of breath with exertion mostly with climbing. He does not have any at nighttime. He has minimal problems with reflux and only has to take the Carafate on an as-needed basis. His bowel movements are regular he does have occasional bright red blood in the stool when he has a hard bowel movement. His next colonoscopy is not due until 5 more years. He's passing his water without problems. He's having trouble with his right shoulder and right elbow and right wrist and this is been going on for couple months. He does not recall any injury. He does have carotid artery stenosis and gets carotid Dopplers done on a yearly basis these were last done in January of this year. There is a family history of heart disease. He does have a history of hyperlipidemia and diabetes. He says that his home blood sugars have been running in the upper 1 teens into the 120 range and this is whether they're fasting or during the day. He feels like he is out of shape. The patient also describes a recent tick bite to the right popliteal area.     Patient Active Problem List   Diagnosis Date Noted  . Chest pain 03/20/2015  . Chest tightness 03/19/2015  . Numbness of left hand 03/19/2015  .  Insomnia 07/19/2014  . Vitamin D deficiency 07/19/2014  . BPH (benign prostatic hyperplasia) 07/19/2014  . DM (diabetes mellitus), type 2 with complications ([place]) 41/32/4401  . Acid reflux 06/03/2013  . 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 Personal history of other diseases of circulatory system 06/03/2013  . Loss of peripheral visual field 05/18/2013  . Non-Obstructive CAD 03/05/2013  . Essential hypertension 02/22/2013  . Shortness of breath   . Obstructive sleep apnea   . Diverticulitis, recurrent, s/p lap sigmoid colectomy 07/23/2012 06/02/2012  . Carotid artery stenosis 09/28/2009  . Obesity (BMI 30-39.9) 08/02/2009  . HLD (hyperlipidemia) 08/01/2009   Outpatient Encounter Prescriptions as of 01/03/2016  Medication Sig  . aspirin 81 MG tablet Take 81 mg by mouth daily.  . cholecalciferol (VITAMIN D) 1000 UNITS tablet Take 2,000 Units by mouth daily.   . Coenzyme Q10 (CO Q-10) 100 MG CAPS Take 100 mg by mouth daily.  Marland Kitchen escitalopram (LEXAPRO) 10 MG tablet TAKE ONE TABLET BY MOUTH ONCE DAILY AS DIRECTED  . fluticasone (FLONASE) 50 MCG/ACT nasal spray Use 2 sprays in each  nostril daily  . glucose blood (ONETOUCH VERIO) test strip Check BS BID and PRN. Dx.250.0  . metFORMIN (GLUCOPHAGE-XR) 500 MG 24 hr tablet Take 1 tablet by mouth  daily with breakfast  . metoprolol (LOPRESSOR) 50 MG tablet Take one-half tablet by  mouth twice a day  .  Multiple Vitamins-Minerals (CENTRUM SILVER PO) Take 1 tablet by mouth daily.  Marland Kitchen NITROSTAT 0.4 MG SL tablet DISSOLVE ONE TABLET UNDER THE TONGUE EVERY 5 MINUTES AS NEEDED FOR CHEST PAIN.  DO NOT EXCEED A TOTAL OF 3 DOSES IN 15 MINUTES  . omeprazole (PRILOSEC) 20 MG capsule Take 1 capsule by mouth  twice daily before meals  . ONETOUCH DELICA LANCETS 62Z MISC Check BS BID and PRN. DX. E11.8  . PRESCRIPTION MEDICATION Inhale 1 application into the lungs at bedtime. "CPAP" MACHINE  . simvastatin (ZOCOR) 20 MG tablet Take 1 tablet by mouth at  bedtime  . sodium  chloride (OCEAN) 0.65 % nasal spray Place 1 spray into the nose as needed for congestion.  . sucralfate (CARAFATE) 1 g tablet Take 1 tablet by mouth  twice a day  . tamsulosin (FLOMAX) 0.4 MG CAPS capsule Take 1 capsule by mouth at  bedtime  . zolpidem (AMBIEN) 10 MG tablet TAKE ONE TABLET AT BEDTIME  . ALPRAZolam (XANAX) 0.25 MG tablet 1/2 tab BID PRN for anxiety (Patient not taking: Reported on 01/03/2016)  . [DISCONTINUED] aspirin 325 MG tablet Take 1 tablet (325 mg total) by mouth daily.  . [DISCONTINUED] azithromycin (ZITHROMAX) 250 MG tablet Take 2 the first day and then one each day after.  . [DISCONTINUED] benzonatate (TESSALON) 200 MG capsule Take 1 capsule (200 mg total) by mouth 3 (three) times daily as needed for cough.   No facility-administered encounter medications on file as of 01/03/2016.      Review of Systems  Constitutional: Negative.   HENT: Negative.   Eyes: Negative.   Respiratory: Positive for shortness of breath (with excertion).   Cardiovascular: Negative.   Gastrointestinal: Negative.   Endocrine: Negative.   Genitourinary: Negative.   Musculoskeletal: Positive for arthralgias (right shoulder , elbow, and wrist pain).  Skin: Negative.   Allergic/Immunologic: Negative.   Neurological: Negative.   Hematological: Negative.   Psychiatric/Behavioral: Negative.        Objective:   Physical Exam  Constitutional: He is oriented to person, place, and time. He appears well-developed and well-nourished. No distress.  HENT:  Head: Normocephalic and atraumatic.  Right Ear: External ear normal.  Left Ear: External ear normal.  Nose: Nose normal.  Mouth/Throat: Oropharynx is clear and moist. No oropharyngeal exudate.  Eyes: Conjunctivae and EOM are normal. Pupils are equal, round, and reactive to light. Right eye exhibits no discharge. Left eye exhibits no discharge. No scleral icterus.  Neck: Normal range of motion. Neck supple. No thyromegaly present.  No bruits  thyromegaly or anterior cervical adenopathy  Cardiovascular: Normal rate, regular rhythm, normal heart sounds and intact distal pulses.   No murmur heard. Heart is regular at 60/m  Pulmonary/Chest: Effort normal and breath sounds normal. No respiratory distress. He has no wheezes. He has no rales. He exhibits no tenderness.  No axillary adenopathy and lungs are clear anteriorly and posteriorly  Abdominal: Soft. Bowel sounds are normal. He exhibits no mass. There is no tenderness. There is no rebound and no guarding.  The abdomen is obese without tenderness liver or spleen enlargement or inguinal adenopathy  Genitourinary: Rectum normal and penis normal.  The prostate is enlarged but soft and smooth. There are no rectal masses. There are no inguinal hernias palpable. External genitalia were within normal limits.  Musculoskeletal: Normal range of motion. He exhibits tenderness. He exhibits no edema.  There is tenderness over the right lateral epicondyle.  Lymphadenopathy:    He has no  cervical adenopathy.  Neurological: He is alert and oriented to person, place, and time. He has normal reflexes. No cranial nerve deficit.  Skin: Skin is warm and dry. No rash noted.  There is erythema to the right popliteal area where a small tic was recently removed.  Psychiatric: He has a normal mood and affect. His behavior is normal. Judgment and thought content normal.  Nursing note and vitals reviewed.  BP 119/77 mmHg  Pulse 60  Temp(Src) 97.3 F (36.3 C) (Oral)  Ht 6' 0.5" (1.842 m)  Wt 264 lb (119.75 kg)  BMI 35.29 kg/m2  SpO2 97%  [facility] reading (PRIMARY) by  Dr. Brunilda Payor x-ray with results pending                                       Assessment & Plan:  1. Annual physical exam -The patient is concerned about having previously had polyps and not getting colonoscopies but every 10 years. We will repeat his FOBT at this visit. - Bayer DCA Hb A1c Waived - BMP8+EGFR - CBC with  Differential/Platelet - Hepatic function panel - NMR, lipoprofile - VITAMIN D 25 Hydroxy (Vit-D Deficiency, Fractures) - PSA, total and free - Urinalysis, Complete  2. Type 2 diabetes mellitus with complication, without long-term current use of insulin (HCC) -Blood sugars have been running in the 120s on the average both fasting and during the day and he is not getting a lot of exercise. - BMP8+EGFR - CBC with Differential/Platelet  3. Essential hypertension -Blood pressure is good and he will continue with current treatment - BMP8+EGFR - CBC with Differential/Platelet - Hepatic function panel - DG Chest 2 View; Future  4. Vitamin D deficiency -Patient will continue with current treatment pending results of lab work - CBC with Differential/Platelet - VITAMIN D 25 Hydroxy (Vit-D Deficiency, Fractures)  5. HLD (hyperlipidemia) -Continue with aggressive therapeutic lifestyle changes - CBC with Differential/Platelet - NMR, lipoprofile  6. BPH (benign prostatic hyperplasia) -The patient describes no symptoms with voiding his prostate was slightly enlarged without lumps or masses - CBC with Differential/Platelet - PSA, total and free - Urinalysis, Complete  7. SOB (shortness of breath) -This may very well be due to deconditioning and the patient agrees with this. We will send him for physical therapy to help get him motivated so that he can continue with some kind of therapy at home because he has exercise equipment there. - DG Chest 2 View; Future  8. Tick bite of calf, right, initial encounter -Doxycycline twice daily for 3 weeks with food and get blood work for tick titers.  9. Right lateral epicondylitis -Return to clinic for injection  Patient Instructions                       Medicare Annual Wellness Visit  [facility] and the medical providers at [facility] strive to bring you the best medical care.  In doing so we not only want to address  your current medical conditions and concerns but also to detect new conditions early and prevent illness, disease and health-related problems.    Medicare offers a yearly Wellness Visit which allows our clinical staff to assess your need for preventative services including immunizations, lifestyle education, counseling to decrease risk of preventable diseases and screening for fall risk and other medical concerns.    This visit is provided free  of charge (no copay) for all Medicare recipients. The clinical pharmacists at [facility] have begun to conduct these Wellness Visits which will also include a thorough review of all your medications.    As you primary medical provider recommend that you make an appointment for your Annual Wellness Visit if you have not done so already this year.  You may set up this appointment before you leave today or you may call back [phone]) and schedule an appointment.  Please make sure when you call that you mention that you are scheduling your Annual Wellness Visit with the clinical pharmacist so that the appointment may be made for the proper length of time.     Continue current medications. Continue good therapeutic lifestyle changes which include good diet and exercise. Fall precautions discussed with patient. If an FOBT was given today- please return it to our front desk. If you are over 17 years old - you may need Prevnar 61 or the adult Pneumonia vaccine.  **Flu shots are available--- please call and schedule a FLU-CLINIC appointment**  After your visit with [place] today you will receive a survey in the mail or online from [employer] regarding your care with [place]. Please take a moment to fill this out. Your feedback is very important to [place] as you can help [place] better understand your patient needs as well as improve your experience and satisfaction. WE CARE ABOUT YOU!!!   Wear a tennis elbow brace as discussed until your return appointment  for an injection in the elbow We will schedule you for physical therapy next-door to get you motivated into losing weight Make all efforts to watch her diet and get more exercise Use deep Sher[place] Hammers off if out in the yard to help prevent from getting tick bites   Arrie Senate MD   .

## 2016-01-03 NOTE — Patient Instructions (Addendum)
Medicare Annual Wellness Visit  [facility] and the medical providers at [facility] strive to bring you the best medical care.  In doing so we not only want to address your current medical conditions and concerns but also to detect new conditions early and prevent illness, disease and health-related problems.    Medicare offers a yearly Wellness Visit which allows our clinical staff to assess your need for preventative services including immunizations, lifestyle education, counseling to decrease risk of preventable diseases and screening for fall risk and other medical concerns.    This visit is provided free of charge (no copay) for all Medicare recipients. The clinical pharmacists at [facility] have begun to conduct these Wellness Visits which will also include a thorough review of all your medications.    As you primary medical provider recommend that you make an appointment for your Annual Wellness Visit if you have not done so already this year.  You may set up this appointment before you leave today or you may call back [phone] and schedule an appointment.  Please make sure when you call that you mention that you are scheduling your Annual Wellness Visit with the clinical pharmacist so that the appointment may be made for the proper length of time.     Continue current medications. Continue good therapeutic lifestyle changes which include good diet and exercise. Fall precautions discussed with patient. If an FOBT was given today- please return it to our front desk. If you are over 58 years old - you may need Prevnar 33 or the adult Pneumonia vaccine.  **Flu shots are available--- please call and schedule a FLU-CLINIC appointment**  After your visit with [place] today you will receive a survey in the mail or online from [employer] regarding your care with [place]. Please take a moment to fill this out. Your feedback is very  important to [place] as you can help [place] better understand your patient needs as well as improve your experience and satisfaction. WE CARE ABOUT YOU!!!   Wear a tennis elbow brace as discussed until your return appointment for an injection in the elbow We will schedule you for physical therapy next-door to get you motivated into losing weight Make all efforts to watch her diet and get more exercise Use deep Sher[place] Hammers off if out in the yard to help prevent from getting tick bites

## 2016-01-04 LAB — BMP8+EGFR
BUN/Creatinine Ratio: 15 (ref 10–24)
BUN: 15 mg/dL (ref 8–27)
CALCIUM: 9.8 mg/dL (ref 8.6–10.2)
CO2: 21 mmol/L (ref 18–29)
CREATININE: 0.98 mg/dL (ref 0.76–1.27)
Chloride: 99 mmol/L (ref 96–106)
GFR calc Af Amer: 87 mL/min/{1.73_m2} (ref >59)
GFR calc non Af Amer: 75 mL/min/{1.73_m2} (ref >59)
GLUCOSE: 121 mg/dL — AB (ref 65–99)
Potassium: 4.9 mmol/L (ref 3.5–5.2)
Sodium: 138 mmol/L (ref 134–144)

## 2016-01-04 LAB — ROCKY MTN SPOTTED FVR ABS PNL(IGG+IGM)
RMSF IGM: 0.25 {index} (ref 0.00–0.89)
RMSF IgG: NEGATIVE

## 2016-01-04 LAB — HEPATIC FUNCTION PANEL
ALBUMIN: 4.5 g/dL (ref 3.5–4.8)
ALT: 20 IU/L (ref 0–44)
AST: 22 IU/L (ref 0–40)
Alkaline Phosphatase: 42 IU/L (ref 39–117)
BILIRUBIN TOTAL: 0.4 mg/dL (ref 0.0–1.2)
Bilirubin, Direct: 0.12 mg/dL (ref 0.00–0.40)
Total Protein: 6.8 g/dL (ref 6.0–8.5)

## 2016-01-04 LAB — CBC WITH DIFFERENTIAL/PLATELET
Basophils Absolute: 0.1 10*3/uL (ref 0.0–0.2)
Basos: 1 %
EOS (ABSOLUTE): 0.2 10*3/uL (ref 0.0–0.4)
EOS: 2 %
HEMATOCRIT: 45.1 % (ref 37.5–51.0)
HEMOGLOBIN: 15.2 g/dL (ref 12.6–17.7)
IMMATURE GRANS (ABS): 0.1 10*3/uL (ref 0.0–0.1)
IMMATURE GRANULOCYTES: 1 %
LYMPHS: 22 %
Lymphocytes Absolute: 1.9 10*3/uL (ref 0.7–3.1)
MCH: 32.2 pg (ref 26.6–33.0)
MCHC: 33.7 g/dL (ref 31.5–35.7)
MCV: 96 fL (ref 79–97)
MONOCYTES: 10 %
MONOS ABS: 0.9 10*3/uL (ref 0.1–0.9)
NEUTROS PCT: 64 %
Neutrophils Absolute: 5.4 10*3/uL (ref 1.4–7.0)
Platelets: 304 10*3/uL (ref 150–379)
RBC: 4.72 x10E6/uL (ref 4.14–5.80)
RDW: 13.3 % (ref 12.3–15.4)
WBC: 8.5 10*3/uL (ref 3.4–10.8)

## 2016-01-04 LAB — PSA, TOTAL AND FREE
PSA, Free Pct: 34.6 %
PSA, Free: 0.45 ng/mL
Prostate Specific Ag, Serum: 1.3 ng/mL (ref 0.0–4.0)

## 2016-01-04 LAB — LYME AB/WESTERN BLOT REFLEX: LYME DISEASE AB, QUANT, IGM: <0.8 index (ref 0.00–0.79)

## 2016-01-04 LAB — VITAMIN D 25 HYDROXY (VIT D DEFICIENCY, FRACTURES): VIT D 25 HYDROXY: 44.5 ng/mL (ref 30.0–100.0)

## 2016-01-04 LAB — NMR, LIPOPROFILE
Cholesterol: 140 mg/dL (ref 100–199)
HDL CHOLESTEROL BY NMR: 50 mg/dL (ref >39)
HDL PARTICLE NUMBER: 37.8 umol/L (ref >=30.5)
LDL Particle Number: 910 nmol/L (ref <1000)
LDL Size: 20.4 nm (ref >20.5)
LDL-C: 66 mg/dL (ref 0–99)
LP-IR Score: 67 — ABNORMAL HIGH (ref <=45)
SMALL LDL PARTICLE NUMBER: 448 nmol/L (ref <=527)
Triglycerides by NMR: 122 mg/dL (ref 0–149)

## 2016-01-05 ENCOUNTER — Encounter: Payer: Self-pay | Admitting: Family Medicine

## 2016-01-05 ENCOUNTER — Ambulatory Visit (INDEPENDENT_AMBULATORY_CARE_PROVIDER_SITE_OTHER): Payer: Medicare Other | Admitting: Family Medicine

## 2016-01-05 VITALS — BP 99/72 | HR 65 | Temp 97.5°F | Ht 72.5 in | Wt 260.0 lb

## 2016-01-05 DIAGNOSIS — M7711 Lateral epicondylitis, right elbow: Secondary | ICD-10-CM | POA: Diagnosis not present

## 2016-01-05 NOTE — Progress Notes (Signed)
Subjective:    Patient ID: Cristian Moore, male    DOB: Jan 28, 1941, 75 y.o.   MRN: [id number]  HPI  Patient here today for right elbow pain and injection. Patient was seen earlier and placed in a tennis elbow strap and asked to return today for injection.      Patient Active Problem List   Diagnosis Date Noted  . Chest pain 03/20/2015  . Chest tightness 03/19/2015  . Numbness of left hand 03/19/2015  . Insomnia 07/19/2014  . Vitamin D deficiency 07/19/2014  . BPH (benign prostatic hyperplasia) 07/19/2014  . DM (diabetes mellitus), type 2 with complications ([facility]) [id number]  . Acid reflux 06/03/2013  . 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 Personal history of other diseases of circulatory system 06/03/2013  . Loss of peripheral visual field 05/18/2013  . Non-Obstructive CAD 03/05/2013  . Essential hypertension 02/22/2013  . Shortness of breath   . Obstructive sleep apnea   . Diverticulitis, recurrent, s/p lap sigmoid colectomy 07/23/2012 06/02/2012  . Carotid artery stenosis 09/28/2009  . Obesity (BMI 30-39.9) 08/02/2009  . HLD (hyperlipidemia) 08/01/2009   Outpatient Encounter Prescriptions as of 01/05/2016  Medication Sig  . ALPRAZolam (XANAX) 0.25 MG tablet 1/2 tab BID PRN for anxiety  . aspirin 81 MG tablet Take 81 mg by mouth daily.  . cholecalciferol (VITAMIN D) 1000 UNITS tablet Take 2,000 Units by mouth daily.   . Coenzyme Q10 (CO Q-10) 100 MG CAPS Take 100 mg by mouth daily.  Marland Kitchen doxycycline (VIBRA-TABS) 100 MG tablet Take 1 tablet (100 mg total) by mouth 2 (two) times daily.  Marland Kitchen escitalopram (LEXAPRO) 10 MG tablet TAKE ONE TABLET BY MOUTH ONCE DAILY AS DIRECTED  . fluticasone (FLONASE) 50 MCG/ACT nasal spray Use 2 sprays in each  nostril daily  . glucose blood (ONETOUCH VERIO) test strip Check BS BID and PRN. Dx.250.0  . metFORMIN (GLUCOPHAGE-XR) 500 MG 24 hr tablet Take 1 tablet by mouth  daily with breakfast  . metoprolol (LOPRESSOR) 50 MG tablet Take one-half tablet  by  mouth twice a day  . Multiple Vitamins-Minerals (CENTRUM SILVER PO) Take 1 tablet by mouth daily.  Marland Kitchen NITROSTAT 0.4 MG SL tablet DISSOLVE ONE TABLET UNDER THE TONGUE EVERY 5 MINUTES AS NEEDED FOR CHEST PAIN.  DO NOT EXCEED A TOTAL OF 3 DOSES IN 15 MINUTES  . omeprazole (PRILOSEC) 20 MG capsule Take 1 capsule by mouth  twice daily before meals  . ONETOUCH DELICA LANCETS 99991111 MISC Check BS BID and PRN. DX. E11.8  . PRESCRIPTION MEDICATION Inhale 1 application into the lungs at bedtime. "CPAP" MACHINE  . simvastatin (ZOCOR) 20 MG tablet Take 1 tablet by mouth at  bedtime  . sodium chloride (OCEAN) 0.65 % nasal spray Place 1 spray into the nose as needed for congestion.  . sucralfate (CARAFATE) 1 g tablet Take 1 tablet by mouth  twice a day  . tamsulosin (FLOMAX) 0.4 MG CAPS capsule Take 1 capsule by mouth at  bedtime  . zolpidem (AMBIEN) 10 MG tablet TAKE ONE TABLET AT BEDTIME   No facility-administered encounter medications on file as of 01/05/2016.     Review of Systems  Constitutional: Negative.   HENT: Negative.   Eyes: Negative.   Respiratory: Negative.   Cardiovascular: Negative.   Gastrointestinal: Negative.   Endocrine: Negative.   Genitourinary: Negative.   Musculoskeletal: Positive for arthralgias (right elbow).  Skin: Negative.   Allergic/Immunologic: Negative.   Neurological: Negative.   Hematological: Negative.   Psychiatric/Behavioral: Negative.  Objective:   Physical Exam  Musculoskeletal:  Right elbow: Patient had pain with gripping and with pronation against resistance and with dorsiflexion of the hand against resistance. Pain is along the lateral aspect of the elbow consistent with epicondylitis. Point of maximal tenderness was located and injected with half cc of Depo-Medrol and Marcaine. Elbow strap was reapplied and positioned over the brachioradialis muscle.    BP 99/72 mmHg  Pulse 65  Temp(Src) 97.5 F (36.4 C) (Oral)  Ht 6' 0.5" (1.842 m)  Wt  260 lb (117.935 kg)  BMI 34.76 kg/m2       Assessment & Plan:  1. Right lateral epicondylitis No injected as described above.. Patient cautioned about lifting palms up rather than palms down for the next several weeks. Continue to wear elbow strap and waking hours.  Wardell Honour MD

## 2016-01-07 IMAGING — CT CT HEAD W/O CM
2 series · 16 of 30 positions shown, 20 images · non-contrast
Comparison: None.

CLINICAL DATA: Acute development of speech disturbance.

EXAM:
CT HEAD WITHOUT CONTRAST
TECHNIQUE: Contiguous axial images were obtained from the base of the skull
through the vertex without intravenous contrast.

[Series 201: head w/o, idose (1) · axial · non-contrast · 0.46mm/px · z∈[+99,+234]mm · 13 of 33 slices shown, 17 images]
[im 3/33  brain]
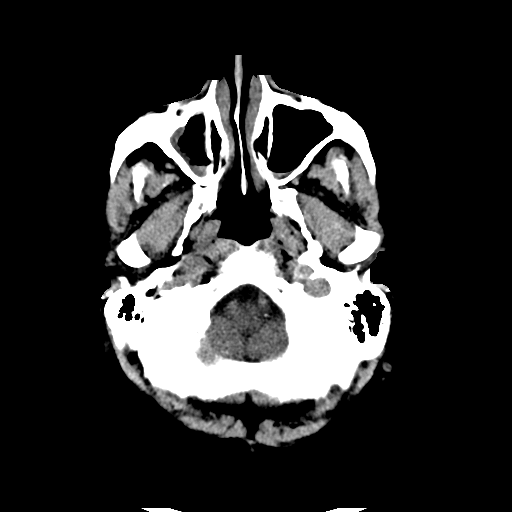
[im 3/33  bone]
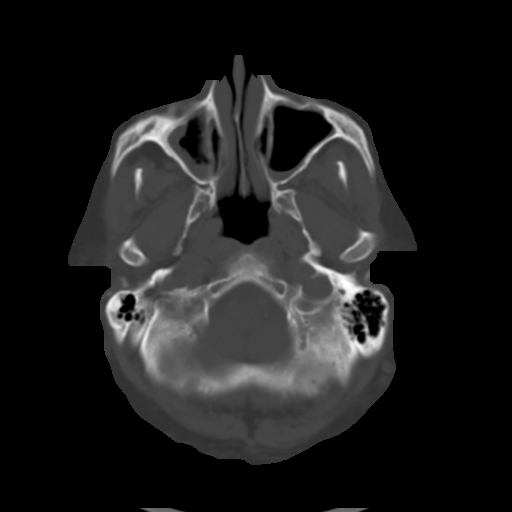
[im 5/33  brain]
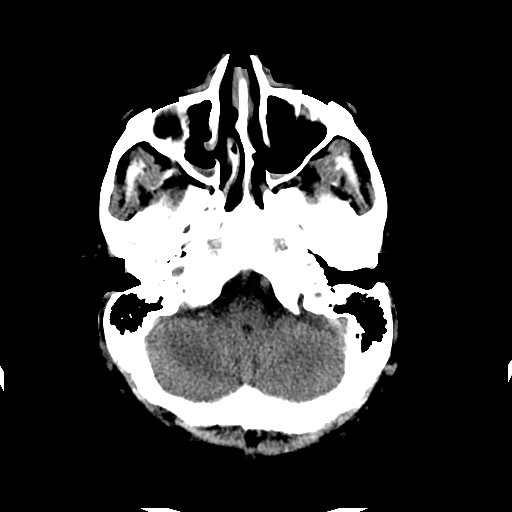
[im 7/33  brain]
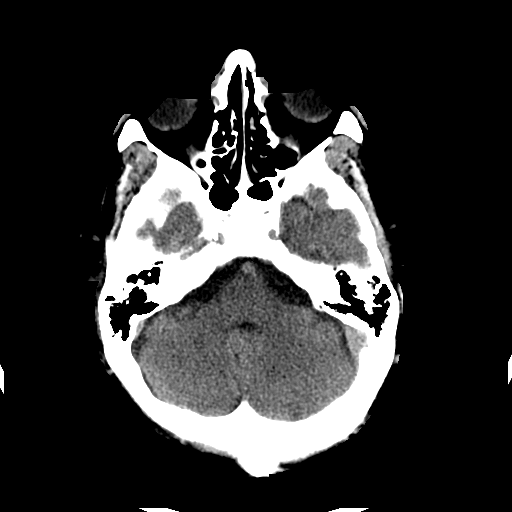
[im 10/33  brain]
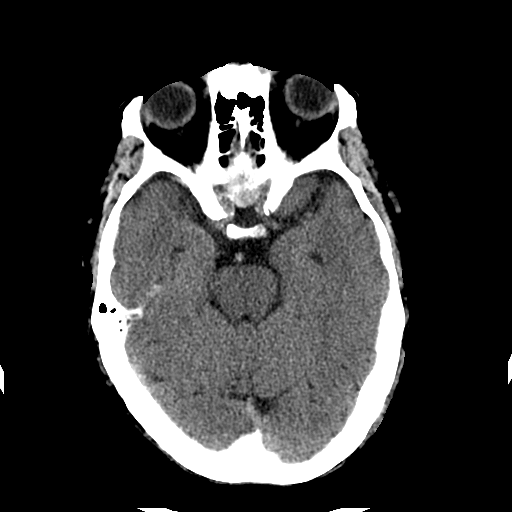
[im 12/33  brain]
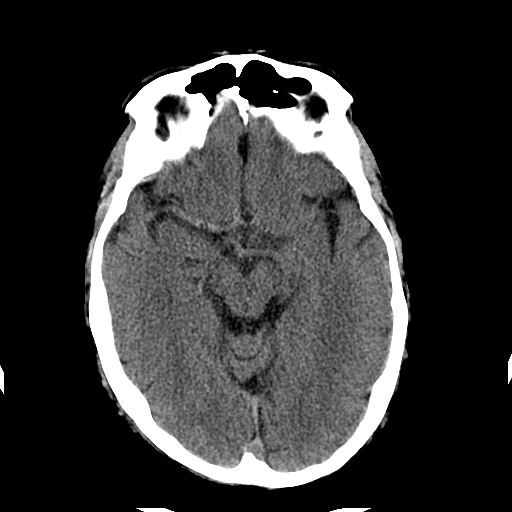
[im 12/33  bone]
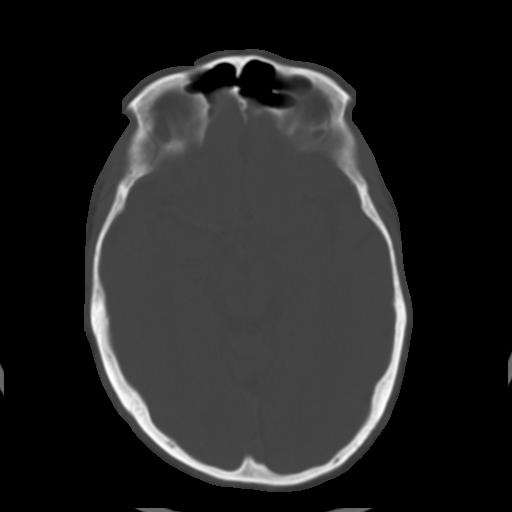
[im 14/33  brain]
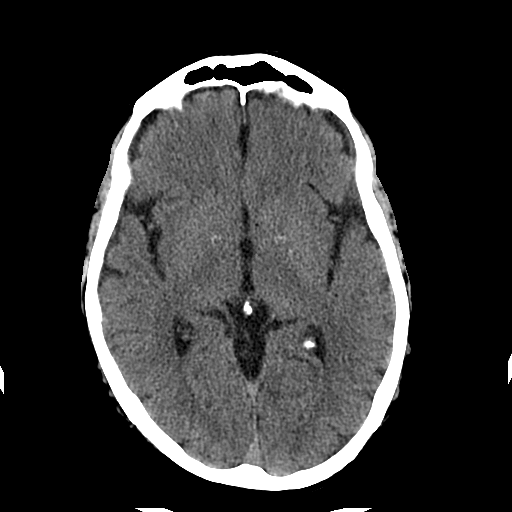
[im 17/33  brain]
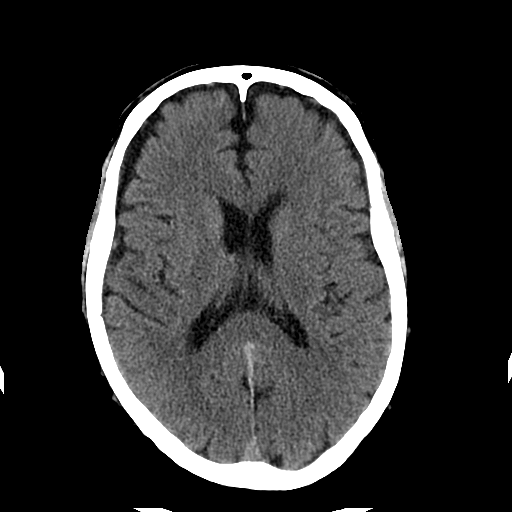
[im 19/33  brain]
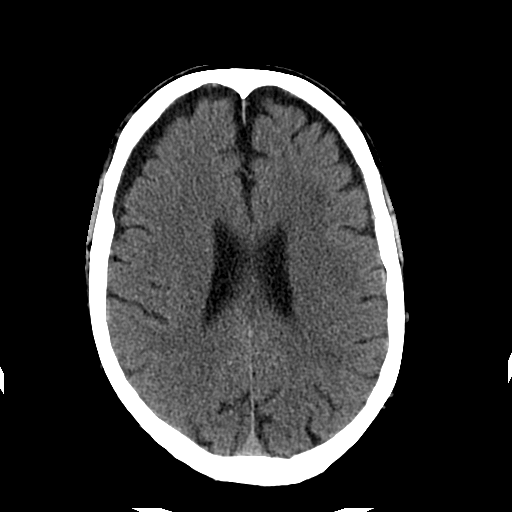
[im 21/33  brain]
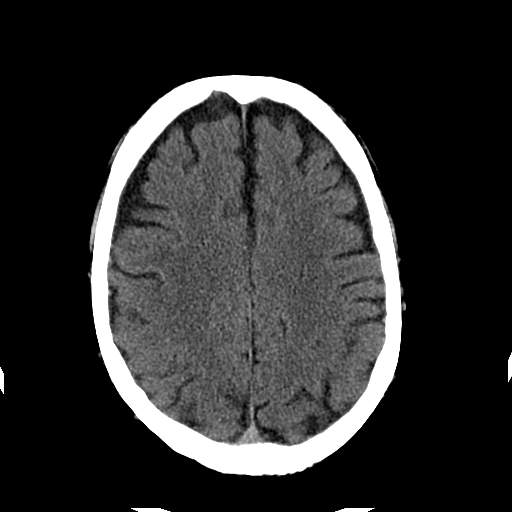
[im 21/33  bone]
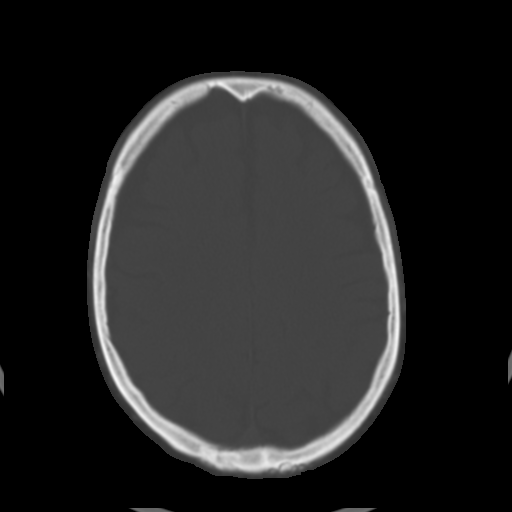
[im 23/33  brain]
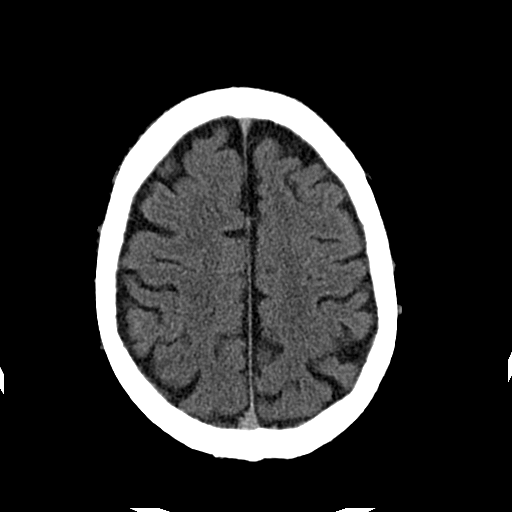
[im 26/33  brain]
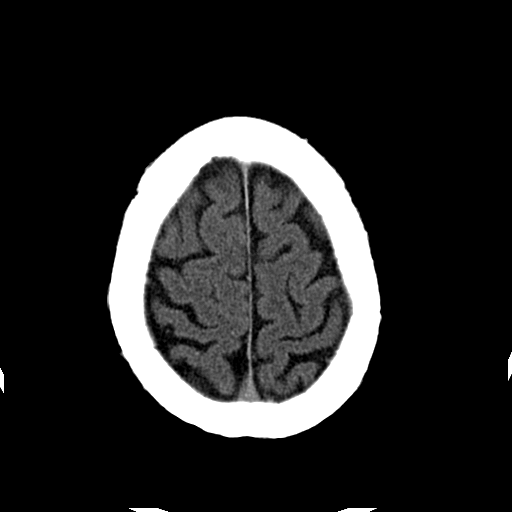
[im 28/33  brain]
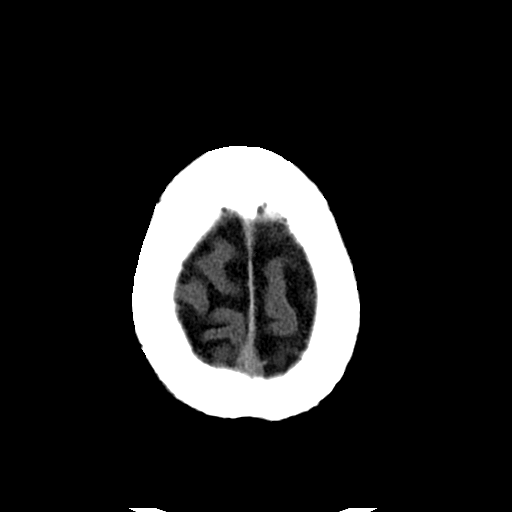
[im 30/33  brain]
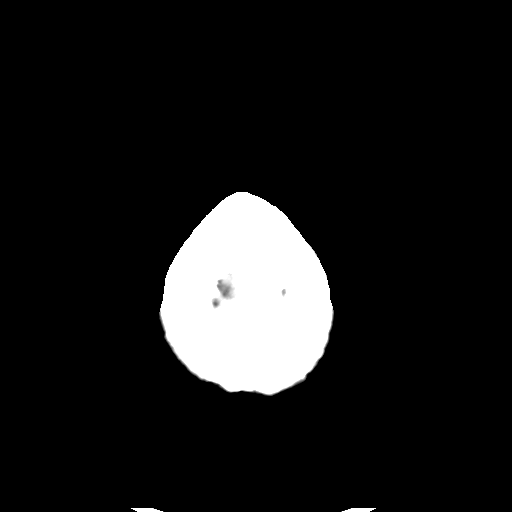
[im 30/33  bone]
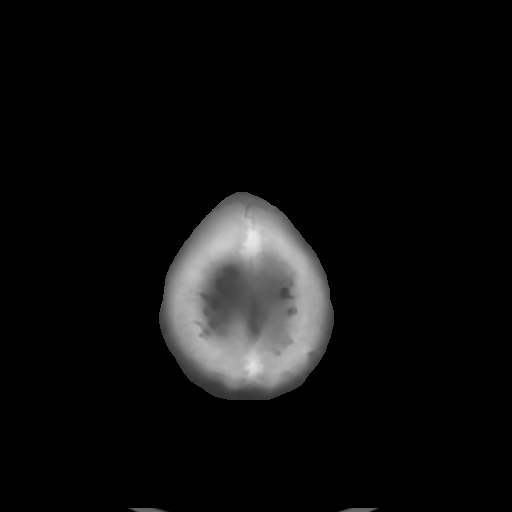

[Series 202: head w/o bone, idose (1) · axial · non-contrast · 0.46mm/px · z∈[+99,+144]mm · 3 of 33 slices shown]
[im 3/33  bone]
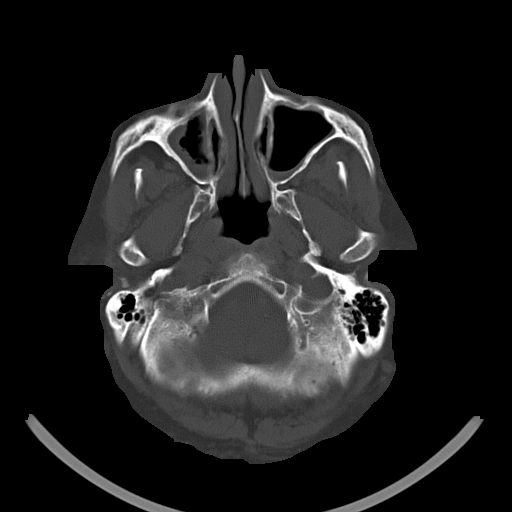
[im 7/33  bone]
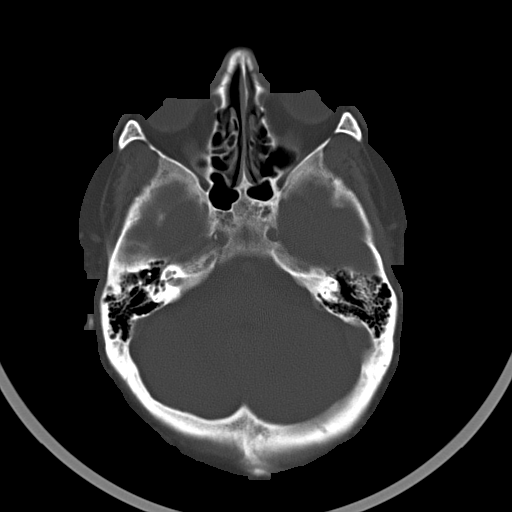
[im 12/33  bone]
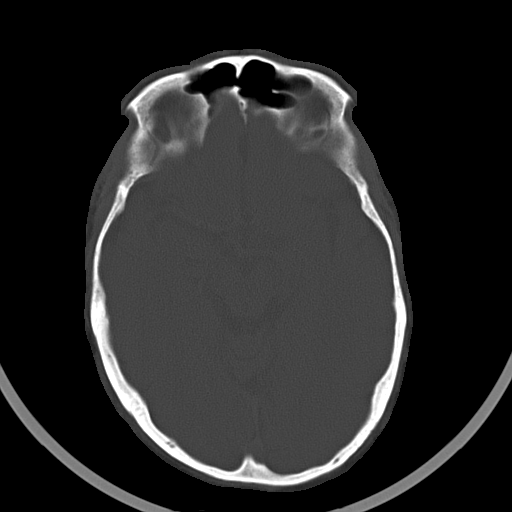

[16 of 30 positions shown; findings below may reference images not displayed]

FINDINGS: The brain shows age related atrophy. There is no evidence of old or
acute small or large vessel infarction. No mass lesion, hemorrhage,
hydrocephalus or extra-axial collection. There is atherosclerotic
calcification of the major vessels at the base of the brain. No
hyperdense vessels unrelated to the calcium. No signs of early
ischemia. The calvarium is unremarkable. There is some mucosal
inflammatory change of the paranasal sinuses with a small amount of
fluid in the maxillary sinuses.
IMPRESSION: No acute finding by CT. Age related volume loss without focal
insult.

## 2016-01-14 ENCOUNTER — Other Ambulatory Visit: Payer: Self-pay | Admitting: Family Medicine

## 2016-01-15 ENCOUNTER — Ambulatory Visit: Payer: Medicare Other | Admitting: Physical Therapy

## 2016-01-26 ENCOUNTER — Other Ambulatory Visit: Payer: Medicare Other

## 2016-01-26 DIAGNOSIS — Z1211 Encounter for screening for malignant neoplasm of colon: Secondary | ICD-10-CM | POA: Diagnosis not present

## 2016-01-27 LAB — FECAL OCCULT BLOOD, IMMUNOCHEMICAL: Fecal Occult Bld: NEGATIVE

## 2016-02-07 ENCOUNTER — Other Ambulatory Visit: Payer: Self-pay | Admitting: Family Medicine

## 2016-03-04 ENCOUNTER — Other Ambulatory Visit: Payer: Self-pay | Admitting: Family Medicine

## 2016-03-11 ENCOUNTER — Other Ambulatory Visit: Payer: Self-pay | Admitting: Family Medicine

## 2016-04-02 ENCOUNTER — Telehealth: Payer: Self-pay | Admitting: Cardiology

## 2016-04-02 NOTE — Telephone Encounter (Signed)
Returned call to patient.He stated he has been having sob with exertion,perspiring heavy for the last 2 to 3 weeks.Stated he went to [place] [address] last week for yearly exam.EKG done was abnormal.He does admit he has occasional chest tightness too.No chest tightness or sob at present.Appointment scheduled with Rosaria Ferries PA 04/03/16 at 2:00 pm.Advised to got to ER if needed.

## 2016-04-02 NOTE — Telephone Encounter (Signed)
New Message  Pt voiced he had appt and pt was sweaty and SOB, ran test and lungs sounded clear.  Pt voiced he completed an ekg and voiced it stated abnormal ekg possible heart attack.  Pt voiced he [facility] but [facility] to be seen by our offices.  Scheduled appt with PA on 04/05/2016 @ 1030.

## 2016-04-03 ENCOUNTER — Encounter: Payer: Self-pay | Admitting: Physician Assistant

## 2016-04-03 ENCOUNTER — Ambulatory Visit (INDEPENDENT_AMBULATORY_CARE_PROVIDER_SITE_OTHER): Payer: Medicare Other | Admitting: Physician Assistant

## 2016-04-03 VITALS — BP 136/68 | HR 84 | Ht 73.0 in | Wt 262.6 lb

## 2016-04-03 DIAGNOSIS — R0600 Dyspnea, unspecified: Secondary | ICD-10-CM

## 2016-04-03 DIAGNOSIS — R0609 Other forms of dyspnea: Secondary | ICD-10-CM | POA: Diagnosis not present

## 2016-04-03 DIAGNOSIS — E785 Hyperlipidemia, unspecified: Secondary | ICD-10-CM | POA: Diagnosis not present

## 2016-04-03 DIAGNOSIS — I10 Essential (primary) hypertension: Secondary | ICD-10-CM | POA: Diagnosis not present

## 2016-04-03 DIAGNOSIS — M5136 Other intervertebral disc degeneration, lumbar region: Secondary | ICD-10-CM | POA: Diagnosis not present

## 2016-04-03 DIAGNOSIS — I2510 Atherosclerotic heart disease of native coronary artery without angina pectoris: Secondary | ICD-10-CM

## 2016-04-03 MED ORDER — METOPROLOL TARTRATE 50 MG PO TABS: 25.0000 mg | ORAL_TABLET | Freq: Two times a day (BID) | ORAL | 3 refills | Status: DC

## 2016-04-03 MED ORDER — NITROGLYCERIN 0.4 MG SL SUBL: SUBLINGUAL_TABLET | SUBLINGUAL | 11 refills | Status: DC

## 2016-04-03 NOTE — Patient Instructions (Addendum)
Schedule 2 day Lexiscan Follow Diabetic Diet for weight loss Your physician recommends that you schedule a follow-up appointment in: 1 month with Dr.Hochrein or Rosaria Ferries PA  Diabetes Mellitus and Food It is important for you to manage your blood sugar (glucose) level. Your blood glucose level can be greatly affected by what you eat. Eating healthier foods in the appropriate amounts throughout the day at about the same time each day will help you control your blood glucose level. It can also help slow or prevent worsening of your diabetes mellitus. Healthy eating may even help you improve the level of your blood pressure and reach or maintain a healthy weight.  General recommendations for healthful eating and cooking habits include:  Eating meals and snacks regularly. Avoid going long periods of time without eating to lose weight.  Eating a diet that consists mainly of plant-based foods, such as fruits, vegetables, nuts, legumes, and whole grains.  Using low-heat cooking methods, such as baking, instead of high-heat cooking methods, such as deep frying. Work with your dietitian to make sure you understand how to use the Nutrition Facts information on food labels. HOW CAN FOOD AFFECT ME? Carbohydrates Carbohydrates affect your blood glucose level more than any other type of food. Your dietitian will help you determine how many carbohydrates to eat at each meal and teach you how to count carbohydrates. Counting carbohydrates is important to keep your blood glucose at a healthy level, especially if you are using insulin or taking certain medicines for diabetes mellitus. Alcohol Alcohol can cause sudden decreases in blood glucose (hypoglycemia), especially if you use insulin or take certain medicines for diabetes mellitus. Hypoglycemia can be a life-threatening condition. Symptoms of hypoglycemia (sleepiness, dizziness, and disorientation) are similar to symptoms of having too much alcohol.  If  your health care provider has given you approval to drink alcohol, do so in moderation and use the following guidelines:  Women should not have more than one drink per day, and men should not have more than two drinks per day. One drink is equal to:  12 oz of beer.  5 oz of wine.  1 oz of hard liquor.  Do not drink on an empty stomach.  Keep yourself hydrated. Have water, diet soda, or unsweetened iced tea.  Regular soda, juice, and other mixers might contain a lot of carbohydrates and should be counted. WHAT FOODS ARE NOT RECOMMENDED? As you make food choices, it is important to remember that all foods are not the same. Some foods have fewer nutrients per serving than other foods, even though they might have the same number of calories or carbohydrates. It is difficult to get your body what it needs when you eat foods with fewer nutrients. Examples of foods that you should avoid that are high in calories and carbohydrates but low in nutrients include:  Trans fats (most processed foods list trans fats on the Nutrition Facts label).  Regular soda.  Juice.  Candy.  Sweets, such as cake, pie, doughnuts, and cookies.  Fried foods. WHAT FOODS CAN I EAT? Eat nutrient-rich foods, which will nourish your body and keep you healthy. The food you should eat also will depend on several factors, including:  The calories you need.  The medicines you take.  Your weight.  Your blood glucose level.  Your blood pressure level.  Your cholesterol level. You should eat a variety of foods, including:  Protein.  Lean cuts of meat.  Proteins low in saturated fats, such  as fish, egg whites, and beans. Avoid processed meats.  Fruits and vegetables.  Fruits and vegetables that may help control blood glucose levels, such as apples, mangoes, and yams.  Dairy products.  Choose fat-free or low-fat dairy products, such as milk, yogurt, and cheese.  Grains, bread, pasta, and  rice.  Choose whole grain products, such as multigrain bread, whole oats, and brown rice. These foods may help control blood pressure.  Fats.  Foods containing healthful fats, such as nuts, avocado, olive oil, canola oil, and fish. DOES EVERYONE WITH DIABETES MELLITUS HAVE THE SAME MEAL PLAN? Because every person with diabetes mellitus is different, there is not one meal plan that works for everyone. It is very important that you meet with a dietitian who will help you create a meal plan that is just right for you.   This information is not intended to replace advice given to you by your health care provider. Make sure you discuss any questions you have with your health care provider.   Document Released: 03/14/2005 Document Revised: 07/08/2014 Document Reviewed: 05/14/2013 Elsevier Interactive Patient Education [employer].

## 2016-04-03 NOTE — Progress Notes (Signed)
Cardiology Office Note   Date:  04/04/2016   ID:  Cristian Moore, DOB [birth year], MRN [id number]  PCP:  Redge Gainer, MD  Cardiologist:  Dr Mardene Celeste, PA-C   History of Present Illness: Cristian Moore is a 75 y.o. male with a history of DM, non-obs carotid dz, BPH, HLD, non-obs CAD by cath 2014, GERD, OSA  Pt called complaining of DOE with diaphoresis and appointments set up.  [name] presents for evaluation of his dyspnea on exertion.  His dyspnea on exertion has been going on for quite a long time. He has noticed it for 6 months or more. He admits that he gets very short of breath whenever he walks up a flight of stairs. Even a couple of years ago, he remembers having to stop at the top of a flight of stairs and sit down to catch his breath.  He was seen at the [facility] on 09/27 for his annual appointment and labs. He was telling them about the DOE and they suggested he be seen.  He has significant musculoskeletal issues. He recently got an injection in his back which has helped some. He admits that deconditioning may be playing a role here because his activity level has been going down for a long time. He feels that he can walk a block without stopping but isn't sure that he can walk 2 blocks. Slopes and stairs give him the most trouble. He hasn't walked up any stairs recently, and tries to avoid them.When he was on vacation, he walked more and did pretty well, except for the hills.  He has not had any chest pain. He smoked a very long time ago, and smoked cigars. He occasionally will hear himself wheeze, but it doesn't happen often. He gets mild lower extremity edema in the daytime, but not that much. He never wakes up with edema.    Past Medical History:  Diagnosis Date  . Arthritis   . Benign prostatic hypertrophy   . Carotid stenosis    Carotid US (07/2013):  Bilateral 1-39% ICA (f/u 2 yrs)  . Chest pain    a. 01/2013 Cath: LM nl, LAD 25p, D1 25p, LCX  nl, RCA 25p, PDA nl, RPL nl, EF 55%.  . Diabetes mellitus without complication ([place]) 123456  . Difficulty urinating   . Diverticulitis   . Diverticulosis   . Exogenous obesity   . GERD (gastroesophageal reflux disease)   . H/O hiatal hernia   . Hernia    Umbulical   . Hyperlipidemia   . Laceration  01/11/2009    Deep laceration left index finger dorsally  . Musculoskeletal pain    in the right shoulder - S/P ROTATOR CUFF REPAIR-BUT STILL HAS PAIN  . Obstructive sleep apnea     CPAP    AUTO SET 6 TO 20 - USUALLY SETTLES OUT AT 10  . Rectal bleeding    PT ATTRIBUTES TO HEMORRHOIDS  . SUPRAVENTRICULAR TACHYCARDIA 08/01/2009   Qualifier: Diagnosis of  By: Lovette Cliche, CNA, Christy    . Thyroiditis   . Ulcer ([place]) 2008    Past Surgical History:  Procedure Laterality Date  . COLONOSCOPY     for polyps  . ELBOW BURSA SURGERY    . Eyelid Surgery Bilateral 05/2013  . INGUINAL HERNIA REPAIR    . KIDNEY STONE SURGERY    . KNEE SURGERY    . LAPAROSCOPIC SIGMOID COLECTOMY  07/23/2012   Procedure: LAPAROSCOPIC SIGMOID COLECTOMY;  Surgeon: [name], MD;  Location: [facility];  Service: General;  Laterality: N/A;  Laparoscopic Sigmoid Colectomy  . LEFT HEART CATHETERIZATION WITH CORONARY ANGIOGRAM N/A 02/22/2013   Procedure: LEFT HEART CATHETERIZATION WITH CORONARY ANGIOGRAM;  Surgeon: [name], MD;  Location: [facility] CATH LAB;  Service: Cardiovascular;  Laterality: N/A;  . PROCTOSCOPY  07/23/2012   Procedure: PROCTOSCOPY;  Surgeon: [name], MD;  Location: [facility];  Service: General;  Laterality: N/A;  Rigid Proctoscopy  . ROTATOR CUFF REPAIR    . TONSILLECTOMY    . UMBILICAL HERNIA REPAIR  07/23/2012   Procedure: HERNIA REPAIR UMBILICAL ADULT;  Surgeon: [name], MD;  Location: [facility];  Service: General;  Laterality: N/A;  Primary Umbilical Hernia Repair    Current Outpatient Prescriptions  Medication Sig Dispense Refill  . ALPRAZolam (XANAX) 0.25 MG tablet 1/2 tab BID PRN for  anxiety 90 tablet 0  . aspirin 81 MG tablet Take 81 mg by mouth daily.    . cholecalciferol (VITAMIN D) 1000 UNITS tablet Take 2,000 Units by mouth daily.     . Coenzyme Q10 (CO Q-10) 100 MG CAPS Take 100 mg by mouth daily.    Marland Kitchen escitalopram (LEXAPRO) 10 MG tablet Take 1 tablet by mouth once daily as directed 90 tablet 1  . fluticasone (FLONASE) 50 MCG/ACT nasal spray Use 2 sprays in each  nostril daily 48 g 1  . glucose blood (ONETOUCH VERIO) test strip Check BS BID and PRN. Dx.250.0 100 each 11  . metFORMIN (GLUCOPHAGE-XR) 500 MG 24 hr tablet Take 1 tablet by mouth  daily with breakfast 90 tablet 0  . metoprolol (LOPRESSOR) 50 MG tablet Take one-half tablet by  mouth twice a day 90 tablet 1  . Multiple Vitamins-Minerals (CENTRUM SILVER PO) Take 1 tablet by mouth daily.    Marland Kitchen NITROSTAT 0.4 MG SL tablet DISSOLVE ONE TABLET UNDER THE TONGUE EVERY 5 MINUTES AS NEEDED FOR CHEST PAIN.  DO NOT EXCEED A TOTAL OF 3 DOSES IN 15 MINUTES 25 tablet 4  . omeprazole (PRILOSEC) 20 MG capsule Take 1 capsule by mouth  twice daily before meals 180 capsule 1  . ONETOUCH DELICA LANCETS 99991111 MISC Check BS BID and PRN. DX. E11.8 100 each 11  . PRESCRIPTION MEDICATION Inhale 1 application into the lungs at bedtime. "CPAP" MACHINE    . simvastatin (ZOCOR) 20 MG tablet Take 1 tablet by mouth at  bedtime 90 tablet 0  . sodium chloride (OCEAN) 0.65 % nasal spray Place 1 spray into the nose as needed for congestion.    . sucralfate (CARAFATE) 1 g tablet Take 1 tablet by mouth  twice a day 180 tablet 1  . tamsulosin (FLOMAX) 0.4 MG CAPS capsule Take 1 capsule by mouth at  bedtime 90 capsule 0  . zolpidem (AMBIEN) 10 MG tablet TAKE ONE TABLET AT BEDTIME 90 tablet 0   No current facility-administered medications for this visit.     Allergies:   Penicillins    Social History:  The patient  reports that he quit smoking about 16 years ago. His smoking use included Cigars. He has never used smokeless tobacco. He reports that  he does not drink alcohol or use drugs. Dr Claris Gladden wife is his niece. His sister married Dr Laurance Flatten.   Family History:  The patient's family history includes Heart disease in his mother; Stroke in his mother.    ROS:  Please see the history of present illness. All other systems are  reviewed and negative.    PHYSICAL EXAM: VS:  BP 136/68   Pulse 84   Ht 6\' 1"  (1.854 m)   Wt 262 lb 9.6 oz (119.1 kg)   BMI 34.65 kg/m  , BMI Body mass index is 34.65 kg/m. GEN: Well nourished, well developed, male in no acute distress  HEENT: normal for age  Neck: no JVD, faint carotid bruits, no masses Cardiac: RRR; no murmur, no rubs, or gallops Respiratory: slightly decreased BS bases bilaterally, normal work of breathing GI: soft, nontender, nondistended, + BS MS: no deformity or atrophy; no edema; distal pulses are 2+ in all 4 extremities   Skin: warm and dry, no rash Neuro:  Strength and sensation are intact Psych: euthymic mood, full affect   EKG:  EKG is not ordered today. The reviewed from the [place] demonstrates Sinus bradycardia, HR 56, no acute ischemic changes   Recent Labs: 08/24/2015: TSH 0.859 01/03/2016: ALT 20; BUN 15; Creatinine, Ser 0.98; Platelets 304; Potassium 4.9; Sodium 138    Lipid Panel    Component Value Date/Time   CHOL 140 01/03/2016 0845   TRIG 122 01/03/2016 0845   HDL 50 01/03/2016 0845   CHOLHDL 3.1 02/21/2013 0212   VLDL 21 02/21/2013 0212   LDLCALC 89 01/31/2014 1010     Wt Readings from Last 3 Encounters:  04/03/16 262 lb 9.6 oz (119.1 kg)  01/05/16 260 lb (117.9 kg)  01/03/16 264 lb (119.7 kg)     Other studies Reviewed: Additional studies/ records that were reviewed today include: office notes, records and testing.  ASSESSMENT AND PLAN:  1.  DOE: He is not wheezing, the symptoms have been going on for a long time and his weight is at the upper end of where he has been over the last several years. I feel there is a deconditioning component, and  possibly a respiratory component, but we will evaluate for CAD first.  He had CXR at the [place] that showed lungs well-expanded and NAD.  Check a Myoview, it will be a 2-day study. If this is normal, discuss the results with him. Further evaluation would be with a CPX. If this is ok, he is encouraged to exercise, but because of MS issues, he will need to use a bike or other low-impact equipment.  2. HTN: his BP is at the upper limit of normal, continue current therapy He was bradycardic on his recent ECG, no sx from this, continue metoprolol 25 mg bid  3. Hyperlipidemia: a profile from the [place] is reviewed. Continue current therapy Total chol 136 03/27/2016  Trig 129   HDL 50   C-LDL 60         Current medicines are reviewed at length with the patient today.  The patient does not have concerns regarding medicines.  The following changes have been made:  no change  Labs/ tests ordered today include:   Orders Placed This Encounter  Procedures  . Myocardial Perfusion Imaging     Disposition:   FU with Dr Percival Spanish or myself  Signed, Lenoard Aden  04/04/2016 8:53 AM    [place] Phone: [phone]; Fax: [phone]  This note was written with the assistance of speech recognition software. Please excuse any transcriptional errors.

## 2016-04-04 ENCOUNTER — Ambulatory Visit: Payer: Medicare Other | Admitting: Family Medicine

## 2016-04-04 ENCOUNTER — Encounter: Payer: Self-pay | Admitting: Physician Assistant

## 2016-04-05 ENCOUNTER — Ambulatory Visit: Payer: Medicare Other | Admitting: Cardiology

## 2016-04-12 ENCOUNTER — Telehealth (HOSPITAL_COMMUNITY): Payer: Self-pay

## 2016-04-12 NOTE — Telephone Encounter (Signed)
Encounter complete. 

## 2016-04-17 ENCOUNTER — Ambulatory Visit (HOSPITAL_COMMUNITY)
Admission: RE | Admit: 2016-04-17 | Discharge: 2016-04-17 | Disposition: A | Discharge status: Home / Self Care | Payer: Medicare Other | Source: Ambulatory Visit | Attending: Physician Assistant | Admitting: Physician Assistant

## 2016-04-17 DIAGNOSIS — Z87891 Personal history of nicotine dependence: Secondary | ICD-10-CM | POA: Insufficient documentation

## 2016-04-17 DIAGNOSIS — Z8249 Family history of ischemic heart disease and other diseases of the circulatory system: Secondary | ICD-10-CM | POA: Insufficient documentation

## 2016-04-17 DIAGNOSIS — R5383 Other fatigue: Secondary | ICD-10-CM | POA: Diagnosis not present

## 2016-04-17 DIAGNOSIS — R079 Chest pain, unspecified: Secondary | ICD-10-CM | POA: Insufficient documentation

## 2016-04-17 DIAGNOSIS — G4733 Obstructive sleep apnea (adult) (pediatric): Secondary | ICD-10-CM | POA: Diagnosis not present

## 2016-04-17 DIAGNOSIS — E119 Type 2 diabetes mellitus without complications: Secondary | ICD-10-CM | POA: Insufficient documentation

## 2016-04-17 DIAGNOSIS — R0609 Other forms of dyspnea: Secondary | ICD-10-CM | POA: Insufficient documentation

## 2016-04-17 DIAGNOSIS — I10 Essential (primary) hypertension: Secondary | ICD-10-CM | POA: Diagnosis not present

## 2016-04-17 DIAGNOSIS — I779 Disorder of arteries and arterioles, unspecified: Secondary | ICD-10-CM | POA: Insufficient documentation

## 2016-04-17 DIAGNOSIS — E669 Obesity, unspecified: Secondary | ICD-10-CM | POA: Diagnosis not present

## 2016-04-17 DIAGNOSIS — Z6834 Body mass index (BMI) 34.0-34.9, adult: Secondary | ICD-10-CM | POA: Diagnosis not present

## 2016-04-17 DIAGNOSIS — I2510 Atherosclerotic heart disease of native coronary artery without angina pectoris: Secondary | ICD-10-CM | POA: Diagnosis not present

## 2016-04-17 MED ORDER — TECHNETIUM TC 99M TETROFOSMIN IV KIT
30.3000 | PACK | Freq: Once | INTRAVENOUS | Status: AC | PRN
  Administered 2016-04-17: 30.3 via INTRAVENOUS
  Filled 2016-04-17: qty 31

## 2016-04-17 MED ORDER — REGADENOSON 0.4 MG/5ML IV SOLN
0.4000 mg | Freq: Once | INTRAVENOUS | Status: AC
  Administered 2016-04-17: 0.4 mg via INTRAVENOUS

## 2016-04-18 ENCOUNTER — Ambulatory Visit (HOSPITAL_COMMUNITY)
Admission: RE | Admit: 2016-04-18 | Discharge: 2016-04-18 | Disposition: A | Discharge status: Home / Self Care | Payer: Medicare Other | Source: Ambulatory Visit | Attending: Cardiovascular Disease | Admitting: Cardiovascular Disease

## 2016-04-18 LAB — MYOCARDIAL PERFUSION IMAGING
CHL CUP NUCLEAR SDS: 6
CHL CUP NUCLEAR SSS: 9
CHL CUP RESTING HR STRESS: 59 {beats}/min
LV sys vol: 55 mL
LVDIAVOL: 105 mL (ref 62–150)
NUC STRESS TID: 0.96
Peak HR: 78 {beats}/min
SRS: 2

## 2016-04-18 MED ORDER — TECHNETIUM TC 99M TETROFOSMIN IV KIT
29.5000 | PACK | Freq: Once | INTRAVENOUS | Status: AC | PRN
  Administered 2016-04-18: 29.5 via INTRAVENOUS

## 2016-04-22 ENCOUNTER — Telehealth: Payer: Self-pay | Admitting: Cardiology

## 2016-04-22 DIAGNOSIS — R0602 Shortness of breath: Secondary | ICD-10-CM

## 2016-04-22 NOTE — Telephone Encounter (Signed)
Pt would like his stress test results from last week please.

## 2016-04-22 NOTE — Telephone Encounter (Signed)
Returned call to patient.Advised myoview results not available.Stated he is still sob.Advised I will send message to Rosaria Ferries PA since she ordered myoview.

## 2016-04-24 ENCOUNTER — Ambulatory Visit (INDEPENDENT_AMBULATORY_CARE_PROVIDER_SITE_OTHER): Payer: Medicare Other | Admitting: Family Medicine

## 2016-04-24 ENCOUNTER — Encounter: Payer: Self-pay | Admitting: Family Medicine

## 2016-04-24 VITALS — BP 116/67 | HR 48 | Temp 97.1°F | Ht 73.0 in | Wt 266.0 lb

## 2016-04-24 DIAGNOSIS — I10 Essential (primary) hypertension: Secondary | ICD-10-CM

## 2016-04-24 DIAGNOSIS — R0600 Dyspnea, unspecified: Secondary | ICD-10-CM

## 2016-04-24 DIAGNOSIS — E559 Vitamin D deficiency, unspecified: Secondary | ICD-10-CM

## 2016-04-24 DIAGNOSIS — R0609 Other forms of dyspnea: Secondary | ICD-10-CM

## 2016-04-24 DIAGNOSIS — E7800 Pure hypercholesterolemia, unspecified: Secondary | ICD-10-CM

## 2016-04-24 DIAGNOSIS — R079 Chest pain, unspecified: Secondary | ICD-10-CM | POA: Diagnosis not present

## 2016-04-24 DIAGNOSIS — E118 Type 2 diabetes mellitus with unspecified complications: Secondary | ICD-10-CM

## 2016-04-24 LAB — BAYER DCA HB A1C WAIVED: HB A1C: 5.8 % (ref <7.0)

## 2016-04-24 NOTE — Progress Notes (Signed)
Subjective:    Patient ID: Cristian Moore, male    DOB: 1940-11-17, 75 y.o.   MRN: [id number]  HPI Pt here for follow up and management of chronic medical problems which includes diabetes, hyperlipidemia and hypertension. He is taking medications regularly.The patient brings in an EKG from the [facility] [place] which showed sinus bradycardia at 56/m and possible inferior infarct. When compared to previous EKGs the EKGs are very similar in appearance and no significant changes noted. The patient also brings in an echocardiogram which showed an ejection fraction of 48%. The left ventricular ejection fraction was mildly decreased between 45 and 54%. This study will be scanned into the record along with the EKG from the outside at the [facility]. The cardiologist did read this. There is no ST segment changes. This was considered a low risk study. The patient today at basically complains of some shortness of breath with exertion. His last chest x-ray was done on 01/03/2016 and showed no active cardiopulmonary disease with stable left basilar scarring. The patient recently had a nuclear stress test and we're not aware of the results of that. He does have occasional aching and he does have shortness of breath but couldn't attributes the shortness of breath to his weight and obesity. He is working on his weight and obesity. He does have occasional heartburn and only takes ranitidine as needed he is off of the omeprazole. He is not seen any blood in the stool or had any black tarry bowel movements. We discussed that he might want to take the ranitidine a little bit more regularly instead of as needed. He understands this. He's passing his water well and just complains of frequency.    Patient Active Problem List   Diagnosis Date Noted  . Chest pain 03/20/2015  . Chest tightness 03/19/2015  . Numbness of left hand 03/19/2015  . Insomnia 07/19/2014  . Vitamin D deficiency 07/19/2014  . BPH (benign prostatic hyperplasia)  07/19/2014  . DM (diabetes mellitus), type 2 with complications ([place]) 42/70/6237  . Acid reflux 06/03/2013  . 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 Personal history of other diseases of circulatory system 06/03/2013  . Loss of peripheral visual field 05/18/2013  . Non-Obstructive CAD 03/05/2013  . Essential hypertension 02/22/2013  . Shortness of breath   . Obstructive sleep apnea   . Diverticulitis, recurrent, s/p lap sigmoid colectomy 07/23/2012 06/02/2012  . Carotid artery stenosis 09/28/2009  . Obesity (BMI 30-39.9) 08/02/2009  . HLD (hyperlipidemia) 08/01/2009   Outpatient Encounter Prescriptions as of 04/24/2016  Medication Sig  . aspirin 81 MG tablet Take 81 mg by mouth daily.  . cholecalciferol (VITAMIN D) 1000 UNITS tablet Take 2,000 Units by mouth daily.   . Coenzyme Q10 (CO Q-10) 100 MG CAPS Take 100 mg by mouth daily.  Marland Kitchen escitalopram (LEXAPRO) 10 MG tablet Take 1 tablet by mouth once daily as directed  . fluticasone (FLONASE) 50 MCG/ACT nasal spray Use 2 sprays in each  nostril daily  . glucose blood (ONETOUCH VERIO) test strip Check BS BID and PRN. Dx.250.0  . metFORMIN (GLUCOPHAGE-XR) 500 MG 24 hr tablet Take 1 tablet by mouth  daily with breakfast  . metoprolol (LOPRESSOR) 50 MG tablet Take 0.5 tablets (25 mg total) by mouth 2 (two) times daily.  . Multiple Vitamins-Minerals (CENTRUM SILVER PO) Take 1 tablet by mouth daily.  . nitroGLYCERIN (NITROSTAT) 0.4 MG SL tablet DISSOLVE ONE TABLET UNDER THE TONGUE EVERY 5 MINUTES AS NEEDED FOR CHEST PAIN.  DO NOT EXCEED  A TOTAL OF 3 DOSES IN 15 MINUTES  . omeprazole (PRILOSEC) 20 MG capsule Take 1 capsule by mouth  twice daily before meals  . ONETOUCH DELICA LANCETS 16X MISC Check BS BID and PRN. DX. E11.8  . PRESCRIPTION MEDICATION Inhale 1 application into the lungs at bedtime. "CPAP" MACHINE  . simvastatin (ZOCOR) 20 MG tablet Take 1 tablet by mouth at  bedtime  . sodium chloride (OCEAN) 0.65 % nasal spray Place 1 spray into the  nose as needed for congestion.  . sucralfate (CARAFATE) 1 g tablet Take 1 tablet by mouth  twice a day  . tamsulosin (FLOMAX) 0.4 MG CAPS capsule Take 1 capsule by mouth at  bedtime  . zolpidem (AMBIEN) 10 MG tablet TAKE ONE TABLET AT BEDTIME  . ALPRAZolam (XANAX) 0.25 MG tablet 1/2 tab BID PRN for anxiety (Patient not taking: Reported on 04/24/2016)   No facility-administered encounter medications on file as of 04/24/2016.        Review of Systems  Constitutional: Negative.   HENT: Negative.   Eyes: Negative.   Respiratory: Positive for shortness of breath (on excertion).   Cardiovascular: Negative.   Gastrointestinal: Negative.   Endocrine: Negative.   Genitourinary: Negative.   Musculoskeletal: Negative.   Skin: Negative.   Allergic/Immunologic: Negative.   Neurological: Negative.   Hematological: Negative.   Psychiatric/Behavioral: Negative.        Objective:   Physical Exam  Constitutional: He is oriented to person, place, and time. He appears well-developed and well-nourished. No distress.  Pleasant and alert  HENT:  Head: Normocephalic and atraumatic.  Right Ear: External ear normal.  Left Ear: External ear normal.  Nose: Nose normal.  Mouth/Throat: Oropharynx is clear and moist. No oropharyngeal exudate.  Eyes: Conjunctivae and EOM are normal. Pupils are equal, round, and reactive to light. Right eye exhibits no discharge. Left eye exhibits no discharge. No scleral icterus.  Neck: Normal range of motion. Neck supple. No thyromegaly present.  No bruits thyromegaly or anterior cervical adenopathy  Cardiovascular: Normal rate, regular rhythm, normal heart sounds and intact distal pulses.   No murmur heard. Heart is regular at 60/m  Pulmonary/Chest: Effort normal and breath sounds normal. No respiratory distress. He has no wheezes. He has no rales. He exhibits no tenderness.  Clear anteriorly and posteriorly and no axillary adenopathy  Abdominal: Soft. Bowel sounds  are normal. He exhibits no mass. There is no tenderness. There is no rebound and no guarding.  No abdominal tenderness masses or organ enlargement or bruits  Musculoskeletal: Normal range of motion. He exhibits no edema.  Lymphadenopathy:    He has no cervical adenopathy.  Neurological: He is alert and oriented to person, place, and time. He has normal reflexes. No cranial nerve deficit.  Skin: Skin is warm and dry. No rash noted.  Psychiatric: He has a normal mood and affect. His behavior is normal. Judgment and thought content normal.  Nursing note and vitals reviewed.  BP 116/67 (BP Location: Left Arm)   Pulse (!) 48   Temp 97.1 F (36.2 C) (Oral)   Ht '6\' 1"'$  (1.854 m)   Wt 266 lb (120.7 kg)   BMI 35.09 kg/m         Assessment & Plan:  1. Type 2 diabetes mellitus with complication, without long-term current use of insulin (HCC) -The patient's blood sugars at home have been running in the low 100s. - Bayer DCA Hb A1c Waived - CBC with Differential/Platelet  2. Essential hypertension -  His blood pressure is good today with current treatment - BMP8+EGFR - CBC with Differential/Platelet - Hepatic function panel  3. Vitamin D deficiency -Continue current treatment pending results of lab work - CBC with Differential/Platelet - VITAMIN D 25 Hydroxy (Vit-D Deficiency, Fractures)  4. Pure hypercholesterolemia -Continue current treatment and aggressive therapeutic lifestyle changes - CBC with Differential/Platelet - NMR, lipoprofile  5. Dyspnea on exertion -The patient has recently had a stress perfusion study which is currently not available at this time.  6. Chest pain on exertion -The patient says he is had a myocardial perfusion study but the results are not available at this time.  Patient Instructions                       Medicare Annual Wellness Visit  [facility] and the medical providers at [facility] strive to bring you the best medical  care.  In doing so we not only want to address your current medical conditions and concerns but also to detect new conditions early and prevent illness, disease and health-related problems.    Medicare offers a yearly Wellness Visit which allows our clinical staff to assess your need for preventative services including immunizations, lifestyle education, counseling to decrease risk of preventable diseases and screening for fall risk and other medical concerns.    This visit is provided free of charge (no copay) for all Medicare recipients. The clinical pharmacists at [facility] have begun to conduct these Wellness Visits which will also include a thorough review of all your medications.    As you primary medical provider recommend that you make an appointment for your Annual Wellness Visit if you have not done so already this year.  You may set up this appointment before you leave today or you may call back ([phone]) and schedule an appointment.  Please make sure when you call that you mention that you are scheduling your Annual Wellness Visit with the clinical pharmacist so that the appointment may be made for the proper length of time.     Continue current medications. Continue good therapeutic lifestyle changes which include good diet and exercise. Fall precautions discussed with patient. If an FOBT was given today- please return it to our front desk. If you are over 65 years old - you may need Prevnar 12 or the adult Pneumonia vaccine.  **Flu shots are available--- please call and schedule a FLU-CLINIC appointment**  After your visit with [place] today you will receive a survey in the mail or online from [employer] regarding your care with [place]. Please take a moment to fill this out. Your feedback is very important to [place] as you can help [place] better understand your patient needs as well as improve your experience and satisfaction. WE CARE ABOUT YOU!!!   Continue to follow-up  with cardiology If we get a copy of this stress perfusion study we will call you with that result Continue to follow-up with aggressive diet habits and weight loss  Arrie Senate MD

## 2016-04-24 NOTE — Patient Instructions (Addendum)
Medicare Annual Wellness Visit  [facility] and the medical providers at [facility] strive to bring you the best medical care.  In doing so we not only want to address your current medical conditions and concerns but also to detect new conditions early and prevent illness, disease and health-related problems.    Medicare offers a yearly Wellness Visit which allows our clinical staff to assess your need for preventative services including immunizations, lifestyle education, counseling to decrease risk of preventable diseases and screening for fall risk and other medical concerns.    This visit is provided free of charge (no copay) for all Medicare recipients. The clinical pharmacists at [facility] have begun to conduct these Wellness Visits which will also include a thorough review of all your medications.    As you primary medical provider recommend that you make an appointment for your Annual Wellness Visit if you have not done so already this year.  You may set up this appointment before you leave today or you may call back [id number]) and schedule an appointment.  Please make sure when you call that you mention that you are scheduling your Annual Wellness Visit with the clinical pharmacist so that the appointment may be made for the proper length of time.     Continue current medications. Continue good therapeutic lifestyle changes which include good diet and exercise. Fall precautions discussed with patient. If an FOBT was given today- please return it to our front desk. If you are over 59 years old - you may need Prevnar 21 or the adult Pneumonia vaccine.  **Flu shots are available--- please call and schedule a FLU-CLINIC appointment**  After your visit with [place] today you will receive a survey in the mail or online from [employer] regarding your care with [place]. Please take a moment to fill this out. Your feedback is very  important to [place] as you can help [place] better understand your patient needs as well as improve your experience and satisfaction. WE CARE ABOUT YOU!!!   Continue to follow-up with cardiology If we get a copy of this stress perfusion study we will call you with that result Continue to follow-up with aggressive diet habits and weight loss

## 2016-04-25 LAB — NMR, LIPOPROFILE
CHOLESTEROL: 150 mg/dL (ref 100–199)
HDL CHOLESTEROL BY NMR: 47 mg/dL (ref >39)
HDL PARTICLE NUMBER: 36.6 umol/L (ref >=30.5)
LDL Particle Number: 1143 nmol/L — ABNORMAL HIGH (ref <1000)
LDL Size: 20.2 nm (ref >20.5)
LDL-C: 80 mg/dL (ref 0–99)
LP-IR Score: 67 — ABNORMAL HIGH (ref <=45)
SMALL LDL PARTICLE NUMBER: 630 nmol/L — AB (ref <=527)
TRIGLYCERIDES BY NMR: 115 mg/dL (ref 0–149)

## 2016-04-25 LAB — BMP8+EGFR
BUN / CREAT RATIO: 12 (ref 10–24)
BUN: 13 mg/dL (ref 8–27)
CHLORIDE: 102 mmol/L (ref 96–106)
CO2: 26 mmol/L (ref 18–29)
Calcium: 9.4 mg/dL (ref 8.6–10.2)
Creatinine, Ser: 1.05 mg/dL (ref 0.76–1.27)
GFR calc non Af Amer: 69 mL/min/{1.73_m2} (ref >59)
GFR, EST AFRICAN AMERICAN: 79 mL/min/{1.73_m2} (ref >59)
Glucose: 106 mg/dL — ABNORMAL HIGH (ref 65–99)
Potassium: 4.5 mmol/L (ref 3.5–5.2)
SODIUM: 141 mmol/L (ref 134–144)

## 2016-04-25 LAB — CBC WITH DIFFERENTIAL/PLATELET
Basophils Absolute: 0.1 10*3/uL (ref 0.0–0.2)
Basos: 1 %
EOS (ABSOLUTE): 0.2 10*3/uL (ref 0.0–0.4)
EOS: 3 %
HEMATOCRIT: 39.4 % (ref 37.5–51.0)
HEMOGLOBIN: 13.7 g/dL (ref 12.6–17.7)
Immature Grans (Abs): 0 10*3/uL (ref 0.0–0.1)
Immature Granulocytes: 0 %
LYMPHS ABS: 1.8 10*3/uL (ref 0.7–3.1)
Lymphs: 29 %
MCH: 32.4 pg (ref 26.6–33.0)
MCHC: 34.8 g/dL (ref 31.5–35.7)
MCV: 93 fL (ref 79–97)
MONOCYTES: 12 %
Monocytes Absolute: 0.7 10*3/uL (ref 0.1–0.9)
NEUTROS ABS: 3.3 10*3/uL (ref 1.4–7.0)
Neutrophils: 55 %
Platelets: 264 10*3/uL (ref 150–379)
RBC: 4.23 x10E6/uL (ref 4.14–5.80)
RDW: 13.1 % (ref 12.3–15.4)
WBC: 6.1 10*3/uL (ref 3.4–10.8)

## 2016-04-25 LAB — VITAMIN D 25 HYDROXY (VIT D DEFICIENCY, FRACTURES): VIT D 25 HYDROXY: 44.5 ng/mL (ref 30.0–100.0)

## 2016-04-25 LAB — HEPATIC FUNCTION PANEL
ALK PHOS: 40 IU/L (ref 39–117)
ALT: 15 IU/L (ref 0–44)
AST: 16 IU/L (ref 0–40)
Albumin: 4.2 g/dL (ref 3.5–4.8)
Bilirubin Total: 0.3 mg/dL (ref 0.0–1.2)
Bilirubin, Direct: 0.1 mg/dL (ref 0.00–0.40)
Total Protein: 6.1 g/dL (ref 6.0–8.5)

## 2016-04-29 ENCOUNTER — Telehealth: Payer: Self-pay | Admitting: Physician Assistant

## 2016-04-29 NOTE — Telephone Encounter (Signed)
Returned call to patient.Rhonda Barrett's recommendations given.Stated he would like to have CPX.Advised our scheduler will call back with appointment.

## 2016-04-29 NOTE — Telephone Encounter (Signed)
Called and left a message to schedule the patient for a CPX test.  Left my name and number to call me back.

## 2016-04-29 NOTE — Telephone Encounter (Signed)
Please let him know the results of the test. Dr Percival Spanish reviewed and it was low risk. If he wants to schedule a CPX, I am happy to order that. If he wants to wait and talk to Dr Percival Spanish about it, that is ok too. Please keep appt coming up.

## 2016-04-29 NOTE — Telephone Encounter (Signed)
error 

## 2016-04-30 ENCOUNTER — Telehealth: Payer: Self-pay | Admitting: Physician Assistant

## 2016-04-30 NOTE — Telephone Encounter (Signed)
Called and left voicemail regarding his CPX test.  Left my name and number.

## 2016-05-03 ENCOUNTER — Telehealth: Payer: Self-pay | Admitting: Cardiology

## 2016-05-03 NOTE — Telephone Encounter (Signed)
Called back patient as he left me a message about a previous message I left him.  He is scheduled for a CPX test on 05-14-16 at 3 p.m.  If he needs to reschedule this test he can call back at [phone].

## 2016-05-10 ENCOUNTER — Telehealth: Payer: Self-pay | Admitting: Family Medicine

## 2016-05-10 ENCOUNTER — Telehealth: Payer: Self-pay | Admitting: Cardiology

## 2016-05-10 NOTE — Telephone Encounter (Signed)
Returning your call. °

## 2016-05-10 NOTE — Telephone Encounter (Signed)
Pt aware of his stress test 

## 2016-05-10 NOTE — Telephone Encounter (Signed)
appt changed

## 2016-05-14 ENCOUNTER — Ambulatory Visit (HOSPITAL_COMMUNITY): Payer: Medicare Other | Attending: Physician Assistant

## 2016-05-14 DIAGNOSIS — R0602 Shortness of breath: Secondary | ICD-10-CM | POA: Insufficient documentation

## 2016-05-15 ENCOUNTER — Ambulatory Visit: Payer: Medicare Other | Admitting: Family Medicine

## 2016-05-17 DIAGNOSIS — R0602 Shortness of breath: Secondary | ICD-10-CM | POA: Diagnosis not present

## 2016-05-21 ENCOUNTER — Other Ambulatory Visit: Payer: Self-pay | Admitting: *Deleted

## 2016-05-21 MED ORDER — METFORMIN HCL ER 500 MG PO TB24: 500.0000 mg | ORAL_TABLET | Freq: Every day | ORAL | 0 refills | Status: DC

## 2016-05-21 MED ORDER — TAMSULOSIN HCL 0.4 MG PO CAPS: 0.4000 mg | ORAL_CAPSULE | Freq: Every day | ORAL | 0 refills | Status: DC

## 2016-05-22 ENCOUNTER — Telehealth: Payer: Self-pay | Admitting: Cardiology

## 2016-05-22 NOTE — Telephone Encounter (Signed)
New message   Pt verbalized that he wants results of stress test

## 2016-05-22 NOTE — Telephone Encounter (Signed)
Called patient w/ results. See result notes.

## 2016-05-24 ENCOUNTER — Encounter: Payer: Self-pay | Admitting: Family Medicine

## 2016-05-24 ENCOUNTER — Ambulatory Visit (INDEPENDENT_AMBULATORY_CARE_PROVIDER_SITE_OTHER): Payer: Medicare Other | Admitting: Family Medicine

## 2016-05-24 VITALS — BP 117/72 | HR 67 | Temp 97.8°F | Ht 73.0 in | Wt 266.6 lb

## 2016-05-24 DIAGNOSIS — J209 Acute bronchitis, unspecified: Secondary | ICD-10-CM

## 2016-05-24 MED ORDER — AZITHROMYCIN 250 MG PO TABS: ORAL_TABLET | ORAL | 0 refills | Status: DC

## 2016-05-24 NOTE — Progress Notes (Signed)
BP 117/72   Pulse 67   Temp 97.8 F (36.6 C) (Oral)   Ht 6\' 1"  (1.854 m)   Wt 266 lb 9.6 oz (120.9 kg)   BMI 35.17 kg/m    Subjective:    Patient ID: Cristian Moore, male    DOB: 08/07/1940, 75 y.o.   MRN: [id number]  HPI: Cristian Moore is a 75 y.o. male presenting on 05/24/2016 for Sore Throat (for a week); Nasal Congestion; and Cough (with sputum)   HPI Sore Throat Patient is having sore throat and congestion and cough that has been going on for the few days. He has tried Mucinex and Flonase which have been helping some but over the past day it has gotten worse. He denies any fevers or chills or shortness of breath or wheezing more than he has had previously. His cough has been productive of yellow-green sputum. He denies any sick contacts that he knows of. He is a gets this about once a year this time year that bothers him.  Relevant past medical, surgical, family and social history reviewed and updated as indicated. Interim medical history since our last visit reviewed. Allergies and medications reviewed and updated.  Review of Systems  Constitutional: Negative for chills and fever.  HENT: Positive for congestion, postnasal drip, rhinorrhea, sinus pressure, sneezing and sore throat. Negative for ear discharge, ear pain and voice change.   Eyes: Negative for pain, discharge, redness and visual disturbance.  Respiratory: Negative for shortness of breath and wheezing.   Cardiovascular: Negative for chest pain and leg swelling.  Musculoskeletal: Negative for gait problem.  Skin: Negative for rash.  All other systems reviewed and are negative.   Per HPI unless specifically indicated above     Objective:    BP 117/72   Pulse 67   Temp 97.8 F (36.6 C) (Oral)   Ht 6\' 1"  (1.854 m)   Wt 266 lb 9.6 oz (120.9 kg)   BMI 35.17 kg/m   Wt Readings from Last 3 Encounters:  05/24/16 266 lb 9.6 oz (120.9 kg)  04/24/16 266 lb (120.7 kg)  04/17/16 262 lb (118.8 kg)      Physical Exam  Constitutional: He is oriented to person, place, and time. He appears well-developed and well-nourished. No distress.  HENT:  Right Ear: Tympanic membrane, external ear and ear canal normal.  Left Ear: Tympanic membrane, external ear and ear canal normal.  Nose: Mucosal edema and rhinorrhea present. No sinus tenderness. No epistaxis. Right sinus exhibits no maxillary sinus tenderness and no frontal sinus tenderness. Left sinus exhibits no maxillary sinus tenderness and no frontal sinus tenderness.  Mouth/Throat: Uvula is midline and mucous membranes are normal. Posterior oropharyngeal edema and posterior oropharyngeal erythema present. No oropharyngeal exudate or tonsillar abscesses.  Eyes: Conjunctivae are normal. Right eye exhibits no discharge. Left eye exhibits no discharge. No scleral icterus.  Neck: Neck supple. No thyromegaly present.  Cardiovascular: Normal rate, regular rhythm, normal heart sounds and intact distal pulses.   No murmur heard. Pulmonary/Chest: Effort normal and breath sounds normal. No respiratory distress. He has no wheezes. He has no rales.  Musculoskeletal: Normal range of motion. He exhibits no edema.  Lymphadenopathy:    He has no cervical adenopathy.  Neurological: He is alert and oriented to person, place, and time. Coordination normal.  Skin: Skin is warm and dry. No rash noted. He is not diaphoretic.  Psychiatric: He has a normal mood and affect. His behavior is normal.  Nursing  note and vitals reviewed.     Assessment & Plan:   Problem List Items Addressed This Visit    None    Visit Diagnoses    Acute bronchitis, unspecified organism    -  Primary   Relevant Medications   azithromycin (ZITHROMAX) 250 MG tablet       Follow up plan: Return if symptoms worsen or fail to improve.  Counseling provided for all of the vaccine components No orders of the defined types were placed in this encounter.   Caryl Pina, MD [facility] 05/24/2016, 12:32 PM

## 2016-05-27 ENCOUNTER — Ambulatory Visit: Payer: Medicare Other | Admitting: Cardiology

## 2016-06-06 ENCOUNTER — Encounter: Payer: Self-pay | Admitting: Cardiology

## 2016-06-07 ENCOUNTER — Other Ambulatory Visit: Payer: Self-pay | Admitting: Family Medicine

## 2016-06-07 NOTE — Telephone Encounter (Signed)
Refill request on Ambien Last filled 03/11/2016 Last seen 04/24/2016

## 2016-06-11 NOTE — Progress Notes (Signed)
Cardiology Office Note   Date:  06/12/2016   ID:  Cristian Moore, DOB April 22, 1941, MRN [id number]  PCP:  Redge Gainer, MD  Cardiologist:   [name], MD  Referring:   Redge Gainer, MD  Chief Complaint  Patient presents with  . Shortness of Breath      History of Present Illness: Cristian Moore is a 75 y.o. male who presents for follow up of dyspnea.  He has a history of DM, nonobstructive CAD by cath 2014.  He presented with SOB and had a [employer] which demonstrated an EF of 48% with a defect present in the basal inferior and mid inferior location. This is most likely due to artifact but  inferior ischemia could not be ruled out.  This was a low risk study.  He returns for follow up. He really notices that he is having shortness of breath climbing stairs he's gained about 15 pounds in last year and is not physically active. He thinks this might be the etiology. He's not having any chest pressure, neck or arm discomfort. He's not having any new shortness of breath, PND or orthopnea. He's not having any palpitations , presyncope or syncope.    Past Medical History:  Diagnosis Date  . Arthritis   . Benign prostatic hypertrophy   . Carotid stenosis    Carotid US (07/2013):  Bilateral 1-39% ICA (f/u 2 yrs)  . Chest pain    a. 01/2013 Cath: LM nl, LAD 25p, D1 25p, LCX nl, RCA 25p, PDA nl, RPL nl, EF 55%.  . Diabetes mellitus without complication ([place]) 123456  . Difficulty urinating   . Diverticulitis   . Diverticulosis   . Exogenous obesity   . GERD (gastroesophageal reflux disease)   . H/O hiatal hernia   . Hernia    Umbulical   . Hyperlipidemia   . Laceration  01/11/2009    Deep laceration left index finger dorsally  . Musculoskeletal pain    in the right shoulder - S/P ROTATOR CUFF REPAIR-BUT STILL HAS PAIN  . Obstructive sleep apnea     CPAP    AUTO SET 6 TO 20 - USUALLY SETTLES OUT AT 10  . Rectal bleeding    PT ATTRIBUTES TO HEMORRHOIDS  .  SUPRAVENTRICULAR TACHYCARDIA 08/01/2009   Qualifier: Diagnosis of  By: Lovette Cliche, CNA, Christy    . Thyroiditis   . Ulcer ([place]) 2008    Past Surgical History:  Procedure Laterality Date  . COLONOSCOPY     for polyps  . ELBOW BURSA SURGERY    . Eyelid Surgery Bilateral 05/2013  . INGUINAL HERNIA REPAIR    . KIDNEY STONE SURGERY    . KNEE SURGERY    . LAPAROSCOPIC SIGMOID COLECTOMY  07/23/2012   Procedure: LAPAROSCOPIC SIGMOID COLECTOMY;  Surgeon: [name], MD;  Location: [facility] ORS;  Service: General;  Laterality: N/A;  Laparoscopic Sigmoid Colectomy  . LEFT HEART CATHETERIZATION WITH CORONARY ANGIOGRAM N/A 02/22/2013   Procedure: LEFT HEART CATHETERIZATION WITH CORONARY ANGIOGRAM;  Surgeon: [name], MD;  Location: [facility] CATH LAB;  Service: Cardiovascular;  Laterality: N/A;  . PROCTOSCOPY  07/23/2012   Procedure: PROCTOSCOPY;  Surgeon: [name], MD;  Location: [facility];  Service: General;  Laterality: N/A;  Rigid Proctoscopy  . ROTATOR CUFF REPAIR    . TONSILLECTOMY    . UMBILICAL HERNIA REPAIR  07/23/2012   Procedure: HERNIA REPAIR UMBILICAL ADULT;  Surgeon: [name], MD;  Location: [facility];  Service: General;  Laterality: N/A;  Primary Umbilical Hernia Repair     Current Outpatient Prescriptions  Medication Sig Dispense Refill  . ALPRAZolam (XANAX) 0.25 MG tablet 1/2 tab BID PRN for anxiety 90 tablet 0  . aspirin 81 MG tablet Take 81 mg by mouth daily.    . cholecalciferol (VITAMIN D) 1000 UNITS tablet Take 2,000 Units by mouth daily.     . Coenzyme Q10 (CO Q-10) 100 MG CAPS Take 100 mg by mouth daily.    Marland Kitchen escitalopram (LEXAPRO) 10 MG tablet Take 1 tablet by mouth once daily as directed 90 tablet 1  . fluticasone (FLONASE) 50 MCG/ACT nasal spray Use 2 sprays in each  nostril daily 48 g 1  . glucose blood (ONETOUCH VERIO) test strip Check BS BID and PRN. Dx.250.0 100 each 11  . metFORMIN (GLUCOPHAGE-XR) 500 MG 24 hr tablet Take 1 tablet (500 mg total) by mouth daily with  breakfast. 90 tablet 0  . metoprolol (LOPRESSOR) 50 MG tablet Take 0.5 tablets (25 mg total) by mouth 2 (two) times daily. 90 tablet 3  . Multiple Vitamins-Minerals (CENTRUM SILVER PO) Take 1 tablet by mouth daily.    . nitroGLYCERIN (NITROSTAT) 0.4 MG SL tablet DISSOLVE ONE TABLET UNDER THE TONGUE EVERY 5 MINUTES AS NEEDED FOR CHEST PAIN.  DO NOT EXCEED A TOTAL OF 3 DOSES IN 15 MINUTES 25 tablet 11  . ONETOUCH DELICA LANCETS 99991111 MISC Check BS BID and PRN. DX. E11.8 100 each 11  . PRESCRIPTION MEDICATION Inhale 1 application into the lungs at bedtime. "CPAP" MACHINE    . ranitidine (ZANTAC) 150 MG capsule Take 150 mg by mouth 2 (two) times daily.    . simvastatin (ZOCOR) 20 MG tablet Take 1 tablet by mouth at  bedtime 90 tablet 0  . sodium chloride (OCEAN) 0.65 % nasal spray Place 1 spray into the nose as needed for congestion.    . sucralfate (CARAFATE) 1 g tablet Take 1 tablet by mouth  twice a day 180 tablet 1  . tamsulosin (FLOMAX) 0.4 MG CAPS capsule Take 1 capsule (0.4 mg total) by mouth at bedtime. 90 capsule 0  . zolpidem (AMBIEN) 10 MG tablet TAKE ONE TABLET AT BEDTIME 90 tablet 0   No current facility-administered medications for this visit.     Allergies:   Penicillins    ROS:  Please see the history of present illness.   Otherwise, review of systems are positive for none.   All other systems are reviewed and negative.    PHYSICAL EXAM: VS:  BP 120/82   Pulse 70   Ht 6\' 1"  (1.854 m)   Wt 270 lb (122.5 kg)   BMI 35.62 kg/m  , BMI Body mass index is 35.62 kg/m. GENERAL:  Well appearing NECK:  No jugular venous distention, waveform within normal limits, carotid upstroke brisk and symmetric, no bruits, no thyromegaly LYMPHATICS:  No cervical, inguinal adenopathy LUNGS:  Clear to auscultation bilaterally BACK:  No CVA tenderness CHEST:  Unremarkable HEART:  PMI not displaced or sustained,S1 and S2 within normal limits, no S3, no S4, no clicks, no rubs, no murmurs ABD:   Flat, positive bowel sounds normal in frequency in pitch, no bruits, no rebound, no guarding, no midline pulsatile mass, no hepatomegaly, no splenomegaly EXT:  2 plus pulses throughout, no edema, no cyanosis no clubbing   EKG:  EKG is not ordered today. The ekg ordered today demonstrates   Recent Labs: 08/24/2015: TSH 0.859 04/24/2016: ALT 15;  BUN 13; Creatinine, Ser 1.05; Platelets 264; Potassium 4.5; Sodium 141    Lipid Panel    Component Value Date/Time   CHOL 150 04/24/2016 1012   TRIG 115 04/24/2016 1012   HDL 47 04/24/2016 1012   CHOLHDL 3.1 02/21/2013 0212   VLDL 21 02/21/2013 0212   LDLCALC 89 01/31/2014 1010      Wt Readings from Last 3 Encounters:  06/12/16 270 lb (122.5 kg)  05/24/16 266 lb 9.6 oz (120.9 kg)  04/24/16 266 lb (120.7 kg)      Other studies Reviewed: Additional studies/ records that were reviewed today include: none. Review of the above records demonstrates:  Please see elsewhere in the note.     ASSESSMENT AND PLAN:  DOE: I suspect that this is related to his weight and deconditioning as there were no high-risk findings. Given this I'm not going to pursue further testing but we had a long discussion about specific plans for weight loss and increased activity. If after about 3 months unless he has more short of breath rather than less likely consider further testing.  HTN:  The blood pressure is at target. No change in medications is indicated. We will continue with therapeutic lifestyle changes (TLC).  Hyperlipidemia:  Per Dr. Laurance Flatten   Current medicines are reviewed at length with the patient today.  The patient does not have concerns regarding medicines.  The following changes have been made:  no change  Labs/ tests ordered today include: None No orders of the defined types were placed in this encounter.    Disposition:   FU with me in  3 months.    Signed, [name], MD  06/12/2016 1:36 PM    [facility]

## 2016-06-12 ENCOUNTER — Ambulatory Visit (INDEPENDENT_AMBULATORY_CARE_PROVIDER_SITE_OTHER): Payer: Medicare Other | Admitting: Cardiology

## 2016-06-12 ENCOUNTER — Encounter: Payer: Self-pay | Admitting: Cardiology

## 2016-06-12 VITALS — BP 120/82 | HR 70 | Ht 73.0 in | Wt 270.0 lb

## 2016-06-12 DIAGNOSIS — R0602 Shortness of breath: Secondary | ICD-10-CM

## 2016-06-12 DIAGNOSIS — I2510 Atherosclerotic heart disease of native coronary artery without angina pectoris: Secondary | ICD-10-CM

## 2016-06-12 NOTE — Patient Instructions (Signed)
Medication Instructions:  The current medical regimen is effective;  continue present plan and medications.  Follow-Up: Follow up with Dr Percival Spanish in [place] in 3 months.  If you need a refill on your cardiac medications before your next appointment, please call your pharmacy.  Thank you for choosing [facility]!!

## 2016-07-18 ENCOUNTER — Other Ambulatory Visit: Payer: Self-pay | Admitting: Family Medicine

## 2016-07-23 ENCOUNTER — Telehealth: Payer: Self-pay | Admitting: Family Medicine

## 2016-07-23 MED ORDER — SIMVASTATIN 20 MG PO TABS: 20.0000 mg | ORAL_TABLET | Freq: Every day | ORAL | 0 refills | Status: DC

## 2016-07-23 NOTE — Telephone Encounter (Signed)
Done again

## 2016-07-30 ENCOUNTER — Other Ambulatory Visit: Payer: Self-pay | Admitting: *Deleted

## 2016-07-30 MED ORDER — TAMSULOSIN HCL 0.4 MG PO CAPS: 0.4000 mg | ORAL_CAPSULE | Freq: Every day | ORAL | 3 refills | Status: DC

## 2016-08-01 LAB — HM DIABETES EYE EXAM

## 2016-08-06 ENCOUNTER — Other Ambulatory Visit: Payer: Self-pay | Admitting: Family Medicine

## 2016-08-06 DIAGNOSIS — I2510 Atherosclerotic heart disease of native coronary artery without angina pectoris: Secondary | ICD-10-CM

## 2016-09-03 ENCOUNTER — Telehealth: Payer: Self-pay | Admitting: Cardiology

## 2016-09-03 NOTE — Telephone Encounter (Signed)
Spoke with pt states that he has been having a cough and SOB for more than a week yesterday the [facility] [employer] [facility] nurse was there his BP was 132/82 HR 62, she listened to his lungs and per pt he does not have a cold or anything. She told him to call and needs a evaluation for his SOB. Appt scheduled for tomorrow with Ignacia Bayley tomorrow for eval.

## 2016-09-03 NOTE — Telephone Encounter (Signed)
New Message   Pt c/o Shortness Of Breath: STAT if SOB developed within the last 24 hours or pt is noticeably SOB on the phone  1. Are you currently SOB (can you hear that pt is SOB on the phone)? Yes  2. How long have you been experiencing SOB? About a week  3. Are you SOB when sitting or when up moving around? Both  4. Are you currently experiencing any other symptoms? Some coughing

## 2016-09-04 ENCOUNTER — Encounter: Payer: Self-pay | Admitting: Nurse Practitioner

## 2016-09-04 ENCOUNTER — Ambulatory Visit (INDEPENDENT_AMBULATORY_CARE_PROVIDER_SITE_OTHER): Payer: Medicare Other | Admitting: Nurse Practitioner

## 2016-09-04 ENCOUNTER — Ambulatory Visit
Admission: RE | Admit: 2016-09-04 | Discharge: 2016-09-04 | Disposition: A | Discharge status: Home / Self Care | Payer: Medicare Other | Source: Ambulatory Visit | Attending: Nurse Practitioner | Admitting: Nurse Practitioner

## 2016-09-04 VITALS — BP 129/70 | HR 66 | Ht 73.0 in | Wt 269.4 lb

## 2016-09-04 DIAGNOSIS — R0600 Dyspnea, unspecified: Secondary | ICD-10-CM

## 2016-09-04 DIAGNOSIS — R05 Cough: Secondary | ICD-10-CM

## 2016-09-04 DIAGNOSIS — R0609 Other forms of dyspnea: Secondary | ICD-10-CM

## 2016-09-04 DIAGNOSIS — R059 Cough, unspecified: Secondary | ICD-10-CM

## 2016-09-04 NOTE — Progress Notes (Signed)
Office Visit    Patient Name: Cristian Moore Date of Encounter: 09/04/2016  Primary Care Provider:  Redge Gainer, MD Primary Cardiologist:  Lenna Sciara. Hochrein, MD [facility])  Chief Complaint    76 year old male with prior history of nonobstructive CAD, chronic dyspnea on exertion, diabetes, morbid obesity, hyperlipidemia, and obstructive sleep apnea, who presents for follow-up related to a 10 day history of dyspnea.  Past Medical History    Past Medical History:  Diagnosis Date  . Arthritis   . Benign prostatic hypertrophy   . Carotid stenosis    Carotid US (07/2013):  Bilateral 1-39% ICA (f/u 2 yrs)  . Chest pain    a. 01/2013 Cath: LM nl, LAD 25p, D1 25p, LCX nl, RCA 25p, PDA nl, RPL nl, EF 55%;  b. 03/2015 MV: EF 59%, no ischemia; c. 03/2016 MV: basal inf/mid inf defect, likely artifact, EF 48%, low risk.  . Chronic Dyspnea on exertion    a. See cardiac eval under CAD heading;  b. 03/2015 Echo: EF 50-55%, gr1 DD, triv AI;  b. 03/2015 CTA Chest: No PE;  c. 05/2016 CPX: no significant cardi-pulmonary limitation noted. Suspect obesity contributing to exertional symptoms.  . Diabetes mellitus without complication ([place]) 4268  . Difficulty urinating   . Diverticulitis   . Diverticulosis   . Exogenous obesity   . GERD (gastroesophageal reflux disease)   . H/O hiatal hernia   . Hernia    Umbulical   . Hyperlipidemia   . Laceration  01/11/2009    Deep laceration left index finger dorsally  . Musculoskeletal pain    in the right shoulder - S/P ROTATOR CUFF REPAIR-BUT STILL HAS PAIN  . Obstructive sleep apnea     CPAP    AUTO SET 6 TO 20 - USUALLY SETTLES OUT AT 10  . Rectal bleeding    PT ATTRIBUTES TO HEMORRHOIDS  . SUPRAVENTRICULAR TACHYCARDIA 08/01/2009   Qualifier: Diagnosis of  By: Lovette Cliche, CNA, Christy    . Thyroiditis   . Ulcer ([place]) 2008   Past Surgical History:  Procedure Laterality Date  . COLONOSCOPY     for polyps  . ELBOW BURSA SURGERY    . Eyelid Surgery Bilateral  05/2013  . INGUINAL HERNIA REPAIR    . KIDNEY STONE SURGERY    . KNEE SURGERY    . LAPAROSCOPIC SIGMOID COLECTOMY  07/23/2012   Procedure: LAPAROSCOPIC SIGMOID COLECTOMY;  Surgeon: [name], MD;  Location: [facility];  Service: General;  Laterality: N/A;  Laparoscopic Sigmoid Colectomy  . LEFT HEART CATHETERIZATION WITH CORONARY ANGIOGRAM N/A 02/22/2013   Procedure: LEFT HEART CATHETERIZATION WITH CORONARY ANGIOGRAM;  Surgeon: [name], MD;  Location: [facility] CATH LAB;  Service: Cardiovascular;  Laterality: N/A;  . PROCTOSCOPY  07/23/2012   Procedure: PROCTOSCOPY;  Surgeon: [name], MD;  Location: [facility];  Service: General;  Laterality: N/A;  Rigid Proctoscopy  . ROTATOR CUFF REPAIR    . TONSILLECTOMY    . UMBILICAL HERNIA REPAIR  07/23/2012   Procedure: HERNIA REPAIR UMBILICAL ADULT;  Surgeon: [name], MD;  Location: [facility];  Service: General;  Laterality: N/A;  Primary Umbilical Hernia Repair    Allergies  Allergies  Allergen Reactions  . Penicillins Rash    Over 33 years ago    History of Present Illness    76 year old male with above past medical history including nonobstructive CAD, obesity, chronic dyspnea, hyperlipidemia, and obstructive sleep apnea. He has had a fairly significant workup for  dyspnea in the past including diagnostic catheterization 2014 with low risk stress tests in September 2016 and again in October 2017. He also underwent CPX testing in November 2017, which did not show any significant cardiopulmonary limitations. It was felt that obesity and deconditioning or the primary drivers behind his dyspnea. When he was last seen in clinic by Dr. Percival Spanish in December 2017, he was relatively stable. He reports that he has lost about 5 or 6 pounds since last seen Dr. Percival Spanish. Over the past 10 days, he has noted some increasing dyspnea on exertion and over the past 2 days has also had a cough. His wife told me he was wheezing yesterday. He was advised by a  home health nurse to follow with cardiology in the setting of dyspnea. He denies PND, orthopnea, dizziness, syncope, edema, or early satiety. No c/p.  Home Medications    Prior to Admission medications   Medication Sig Start Date End Date Taking? Authorizing Provider  ALPRAZolam Duanne Moron) 0.25 MG tablet 1/2 tab BID PRN for anxiety 12/22/15  Yes Wardell Honour, MD  aspirin 81 MG tablet Take 81 mg by mouth daily.   Yes Historical Provider, MD  cholecalciferol (VITAMIN D) 1000 UNITS tablet Take 2,000 Units by mouth daily.    Yes Historical Provider, MD  Coenzyme Q10 (CO Q-10) 100 MG CAPS Take 100 mg by mouth daily.   Yes Historical Provider, MD  escitalopram (LEXAPRO) 10 MG tablet TAKE 1 TABLET BY MOUTH ONCE DAILY AS DIRECTED 07/18/16  Yes Chipper Herb, MD  fluticasone [facility]) 50 MCG/ACT nasal spray USE 2 SPRAYS IN [facility]  NOSTRIL DAILY 07/18/16  Yes Chipper Herb, MD  glucose blood (ONETOUCH VERIO) test strip Check BS BID and PRN. Dx.250.0 03/08/14  Yes Chipper Herb, MD  metFORMIN (GLUCOPHAGE-XR) 500 MG 24 hr tablet Take 1 tablet (500 mg total) by mouth daily with breakfast. 05/21/16  Yes Chipper Herb, MD  metoprolol (LOPRESSOR) 50 MG tablet Take 0.5 tablets (25 mg total) by mouth 2 (two) times daily. 04/03/16  Yes Rhonda G Barrett, PA-C  Multiple Vitamins-Minerals (CENTRUM SILVER PO) Take 1 tablet by mouth daily.   Yes Historical Provider, MD  nitroGLYCERIN (NITROSTAT) 0.4 MG SL tablet DISSOLVE ONE TABLET UNDER THE TONGUE EVERY 5 MINUTES AS NEEDED FOR CHEST PAIN.  DO NOT EXCEED A TOTAL OF 3 DOSES IN 15 MINUTES 04/03/16  Yes Rhonda G Barrett, PA-C  ONETOUCH DELICA LANCETS 76E MISC Check BS BID and PRN. DX. E11.8 07/19/14  Yes Chipper Herb, MD  PRESCRIPTION MEDICATION Inhale 1 application into the lungs at bedtime. "CPAP" MACHINE   Yes Historical Provider, MD  ranitidine (ZANTAC) 150 MG capsule Take 150 mg by mouth 2 (two) times daily.   Yes Historical Provider, MD  simvastatin (ZOCOR) 20 MG tablet  Take 1 tablet (20 mg total) by mouth at bedtime. 07/23/16  Yes Chipper Herb, MD  sodium chloride (OCEAN) 0.65 % nasal spray Place 1 spray into the nose as needed for congestion.   Yes Historical Provider, MD  sucralfate (CARAFATE) 1 g tablet Take 1 tablet by mouth  twice a day 09/21/15  Yes Chipper Herb, MD  tamsulosin (FLOMAX) 0.4 MG CAPS capsule Take 1 capsule (0.4 mg total) by mouth at bedtime. 07/30/16  Yes Chipper Herb, MD  zolpidem (AMBIEN) 10 MG tablet TAKE ONE TABLET AT BEDTIME 06/10/16  Yes Chipper Herb, MD    Review of Systems    Increased DOE over  the past 10 days.  He does have chronic, intermittent DOE that will worsen for a few wks and then get better.  He denies chest pain, palpitations, pnd, orthopnea, n, v, dizziness, syncope, edema, weight gain, or early satiety.  All other systems reviewed and are otherwise negative except as noted above.  Physical Exam    VS:  BP 129/70   Pulse 66   Ht 6\' 1"  (1.854 m)   Wt 269 lb 6.4 oz (122.2 kg)   BMI 35.54 kg/m  , BMI Body mass index is 35.54 kg/m. GEN: Well nourished, well developed, in no acute distress.  HEENT: normal.  Neck: Supple, no JVD, carotid bruits, or masses. Cardiac: RRR, no murmurs, rubs, or gallops. No clubbing, cyanosis, edema.  Radials/DP/PT 2+ and equal bilaterally.  Respiratory:  Respirations regular and unlabored, clear to auscultation bilaterally.  Exp wheezing noted anteriorly with forced expiration. GI: Soft, obese, nontender, nondistended, BS + x 4. MS: no deformity or atrophy. Skin: warm and dry, no rash. Neuro:  Strength and sensation are intact. Psych: Normal affect.  Accessory Clinical Findings    ECG - Radial sinus rhythm, 66, delayed R-wave progression, question old inferior infarct-no acute ST or T changes.  Assessment & Plan    1.  Dyspnea on exertion with cough: Patient has a long history of dyspnea on exertion with fairly complete evaluation dating back to 2014 with catheterization at  that time revealing nonobstructive disease, and subsequent evaluation 2016 with echo and CTA of the chest, both of which be normal. 2017, had a low-risk stress test and a CPX test which did not show any significant cardiopulmonary compromise. It was felt the dyspnea is mostly related to deconditioning.  He reports that his dyspnea comes and goes for weeks at a time. About 10 days ago, he started having some dyspnea and was noted be wheezing by his wife yesterday. He also has been coughing for the past 2 days. His lungs are clear on exam with the exception of on forced expiration, when he does have wheezing anteriorly. I will check a chest x-ray today. He has not had any fevers or chills and is hemodynamically stable. Volume is stable. He has previously seen Dr. Annamaria Boots with pulmonology and given prior cardiac evaluation, with ongoing symptoms, I think is reasonable to refer back to pulmonology.  2.  Dispo:  F/u cxr today.  F/u with J. Hochrein, MD and PCP in [place] as planned.  Murray Hodgkins, NP 09/04/2016, 4:30 PM

## 2016-09-04 NOTE — Patient Instructions (Addendum)
Medication Instructions:  Your physician recommends that you continue on your current medications as directed. Please refer to the Current Medication list given to you today.  If you need a refill on your cardiac medications before your next appointment, please call your pharmacy.  Testing/Procedures: CHEST XRAY-2 VIEW AT [facility] @ [place]  [phone]  Follow-Up: KEEP DR [facility] APPT AS SCHEDULED 09-18-16 @10AM   Special Instructions: REFERRAL TO Baird Lyons, MD-PULMONARY    Thank you for choosing [facility] at [employer]!!    CHRIS Sharolyn Douglas, NP Sharyn Lull, [place]

## 2016-09-05 ENCOUNTER — Telehealth: Payer: Self-pay

## 2016-09-07 ENCOUNTER — Other Ambulatory Visit: Payer: Self-pay | Admitting: Family Medicine

## 2016-09-10 ENCOUNTER — Ambulatory Visit (HOSPITAL_COMMUNITY)
Admission: RE | Admit: 2016-09-10 | Discharge: 2016-09-10 | Disposition: A | Discharge status: Home / Self Care | Payer: Medicare Other | Source: Ambulatory Visit | Attending: Family Medicine | Admitting: Family Medicine

## 2016-09-10 ENCOUNTER — Other Ambulatory Visit: Payer: Self-pay | Admitting: Family Medicine

## 2016-09-10 DIAGNOSIS — I6523 Occlusion and stenosis of bilateral carotid arteries: Secondary | ICD-10-CM | POA: Diagnosis not present

## 2016-09-10 DIAGNOSIS — I2510 Atherosclerotic heart disease of native coronary artery without angina pectoris: Secondary | ICD-10-CM | POA: Diagnosis not present

## 2016-09-16 ENCOUNTER — Encounter: Payer: Self-pay | Admitting: Pulmonary Disease

## 2016-09-16 ENCOUNTER — Ambulatory Visit (INDEPENDENT_AMBULATORY_CARE_PROVIDER_SITE_OTHER): Payer: Medicare Other | Admitting: Pulmonary Disease

## 2016-09-16 ENCOUNTER — Other Ambulatory Visit: Payer: Medicare Other

## 2016-09-16 VITALS — BP 108/78 | HR 61 | Ht 73.0 in | Wt 270.8 lb

## 2016-09-16 DIAGNOSIS — R059 Cough, unspecified: Secondary | ICD-10-CM | POA: Insufficient documentation

## 2016-09-16 DIAGNOSIS — J309 Allergic rhinitis, unspecified: Secondary | ICD-10-CM

## 2016-09-16 DIAGNOSIS — K219 Gastro-esophageal reflux disease without esophagitis: Secondary | ICD-10-CM | POA: Diagnosis not present

## 2016-09-16 DIAGNOSIS — R0600 Dyspnea, unspecified: Secondary | ICD-10-CM

## 2016-09-16 DIAGNOSIS — R05 Cough: Secondary | ICD-10-CM | POA: Diagnosis not present

## 2016-09-16 DIAGNOSIS — G4733 Obstructive sleep apnea (adult) (pediatric): Secondary | ICD-10-CM

## 2016-09-16 DIAGNOSIS — Z9989 Dependence on other enabling machines and devices: Secondary | ICD-10-CM

## 2016-09-16 MED ORDER — BUDESONIDE-FORMOTEROL FUMARATE 160-4.5 MCG/ACT IN AERO: 2.0000 | INHALATION_SPRAY | Freq: Two times a day (BID) | RESPIRATORY_TRACT | 0 refills | Status: DC

## 2016-09-16 MED ORDER — AEROCHAMBER MV MISC: 0 refills | Status: DC

## 2016-09-16 MED ORDER — MONTELUKAST SODIUM 10 MG PO TABS: 10.0000 mg | ORAL_TABLET | Freq: Every day | ORAL | 3 refills | Status: DC

## 2016-09-16 NOTE — Addendum Note (Signed)
Addended by: Tyson Dense on: 09/16/2016 03:26 PM   Modules accepted: Orders

## 2016-09-16 NOTE — Patient Instructions (Addendum)
   Use the Symbicort inhaler we are giving you today with a spacer. Inhale 2 puffs twice daily.  Remember to rinse, gargle, & spit as well as brush your teeth & tongue to keep from getting thrush after you use the Symbicort.  Call me if the Symbicort helps and we will send you in a prescription.  Keep taking all your other medications as prescribed.   Call me if you have any questions or concerns.  TESTS ORDERED: 1. Full PFTs before next appointment 2. 6MWT before next appointment 3. Serum RAST Panel

## 2016-09-16 NOTE — Progress Notes (Signed)
Patient seen in the office today and instructed on use of Symbicort 160 and spacer.  Patient expressed understanding and demonstrated technique.

## 2016-09-16 NOTE — Progress Notes (Signed)
Subjective:    Patient ID: Cristian Moore, male    DOB: 02-Mar-1941, 76 y.o.   MRN: [id number]  HPI The patient has a long-standing history of dyspnea on exertion. He has had an extensive workup by cardiology. He reports his dyspnea is relatively unchanged. He reports he does have fluctuating intensity in his symptoms that seems to be worse after he has a respiratory illness/cold. He "feels like he has a knot" all the time in his anterior chest. He reports dyspnea at rest but reports it is significantly worse with exertion. He does report that his breathing is easier while wearing his CPAP and with prolonged periods of rest. He reports his cough is only intermittently productive and predominantly nonproductive. He does report intermittent wheezing that occurs at rest primarily. No other chest pain or pressure. He does report he coughs more when exposed to smoke or certain sprays. He reports no history of recurrent bronchitis. Remote history of pneumonia. No history of breathing problems as a child. He reports 15-20 years ago he had a particularly bad spell with breathing problems where he was tested for allergies and was treated with desensitiziation that seemed to resolve his coughing. His cough seemed to recur when he quit doing his injections. He has never been on any inhaler medications in the past. No rashes or bruising. He does have intermittent reflux. He has started taking Zantac after transitioning off Prilosec. He reports rare morning brash water taste. He is adherent to his CPAP therapy. He denies any significant sinus congestion or pressure.   Review of Systems No rashes or bruising. A pertinent 14 point review of systems is negative except as per the history of presenting illness.  Allergies  Allergen Reactions  . Penicillins Rash    Over 60 years ago    Current Outpatient Prescriptions on File Prior to Visit  Medication Sig Dispense Refill  . ALPRAZolam (XANAX) 0.25 MG tablet 1/2  tab BID PRN for anxiety 90 tablet 0  . aspirin 81 MG tablet Take 81 mg by mouth daily.    . cholecalciferol (VITAMIN D) 1000 UNITS tablet Take 2,000 Units by mouth daily.     . Coenzyme Q10 (CO Q-10) 100 MG CAPS Take 100 mg by mouth daily.    Marland Kitchen escitalopram (LEXAPRO) 10 MG tablet TAKE 1 TABLET BY MOUTH ONCE DAILY AS DIRECTED 90 tablet 3  . fluticasone (FLONASE) 50 MCG/ACT nasal spray USE 2 SPRAYS IN EACH  NOSTRIL DAILY 48 g 5  . glucose blood (ONETOUCH VERIO) test strip Check BS BID and PRN. Dx.250.0 100 each 11  . metFORMIN (GLUCOPHAGE-XR) 500 MG 24 hr tablet Take 1 tablet (500 mg total) by mouth daily with breakfast. 90 tablet 0  . metoprolol (LOPRESSOR) 50 MG tablet Take 0.5 tablets (25 mg total) by mouth 2 (two) times daily. 90 tablet 3  . Multiple Vitamins-Minerals (CENTRUM SILVER PO) Take 1 tablet by mouth daily.    . nitroGLYCERIN (NITROSTAT) 0.4 MG SL tablet DISSOLVE ONE TABLET UNDER THE TONGUE EVERY 5 MINUTES AS NEEDED FOR CHEST PAIN.  DO NOT EXCEED A TOTAL OF 3 DOSES IN 15 MINUTES 25 tablet 11  . ONETOUCH DELICA LANCETS 60V MISC Check BS BID and PRN. DX. E11.8 100 each 11  . PRESCRIPTION MEDICATION Inhale 1 application into the lungs at bedtime. "CPAP" MACHINE    . ranitidine (ZANTAC) 150 MG capsule Take 150 mg by mouth 2 (two) times daily.    . simvastatin (ZOCOR) 20 MG tablet  TAKE 1 TABLET BY MOUTH AT  BEDTIME 90 tablet 1  . sodium chloride (OCEAN) 0.65 % nasal spray Place 1 spray into the nose as needed for congestion.    . sucralfate (CARAFATE) 1 g tablet Take 1 tablet by mouth  twice a day 180 tablet 1  . tamsulosin (FLOMAX) 0.4 MG CAPS capsule Take 1 capsule (0.4 mg total) by mouth at bedtime. 90 capsule 3  . zolpidem (AMBIEN) 10 MG tablet TAKE ONE TABLET AT BEDTIME 90 tablet 0   No current facility-administered medications on file prior to visit.     Past Medical History:  Diagnosis Date  . Arthritis   . Benign prostatic hypertrophy   . Carotid stenosis    Carotid US  (07/2013):  Bilateral 1-39% ICA (f/u 2 yrs)  . Chest pain    a. 01/2013 Cath: LM nl, LAD 25p, D1 25p, LCX nl, RCA 25p, PDA nl, RPL nl, EF 55%;  b. 03/2015 MV: EF 59%, no ischemia; c. 03/2016 MV: basal inf/mid inf defect, likely artifact, EF 48%, low risk.  . Chronic Dyspnea on exertion    a. See cardiac eval under CAD heading;  b. 03/2015 Echo: EF 50-55%, gr1 DD, triv AI;  b. 03/2015 CTA Chest: No PE;  c. 05/2016 CPX: no significant cardi-pulmonary limitation noted. Suspect obesity contributing to exertional symptoms.  . Diabetes mellitus without complication ([place]) 4098  . Difficulty urinating   . Diverticulitis   . Diverticulosis   . Exogenous obesity   . GERD (gastroesophageal reflux disease)   . H/O hiatal hernia   . Hernia    Umbulical   . Hyperlipidemia   . Laceration  01/11/2009    Deep laceration left index finger dorsally  . Musculoskeletal pain    in the right shoulder - S/P ROTATOR CUFF REPAIR-BUT STILL HAS PAIN  . Obstructive sleep apnea     CPAP    AUTO SET 6 TO 20 - USUALLY SETTLES OUT AT 10  . Rectal bleeding    PT ATTRIBUTES TO HEMORRHOIDS  . SUPRAVENTRICULAR TACHYCARDIA 08/01/2009   Qualifier: Diagnosis of  By: Lovette Cliche, CNA, Christy    . Thyroiditis   . Ulcer ([place]) 2008    Past Surgical History:  Procedure Laterality Date  . COLONOSCOPY     for polyps  . ELBOW BURSA SURGERY    . Eyelid Surgery Bilateral 05/2013  . INGUINAL HERNIA REPAIR    . KIDNEY STONE SURGERY    . KNEE SURGERY    . LAPAROSCOPIC SIGMOID COLECTOMY  07/23/2012   Procedure: LAPAROSCOPIC SIGMOID COLECTOMY;  Surgeon: [name], MD;  Location: [facility];  Service: General;  Laterality: N/A;  Laparoscopic Sigmoid Colectomy  . LEFT HEART CATHETERIZATION WITH CORONARY ANGIOGRAM N/A 02/22/2013   Procedure: LEFT HEART CATHETERIZATION WITH CORONARY ANGIOGRAM;  Surgeon: [name], MD;  Location[facility] CATH LAB;  Service: Cardiovascular;  Laterality: N/A;  . PROCTOSCOPY  07/23/2012   Procedure: PROCTOSCOPY;   Surgeon: [name], MD;  Location: [facility];  Service: General;  Laterality: N/A;  Rigid Proctoscopy  . ROTATOR CUFF REPAIR    . TONSILLECTOMY    . UMBILICAL HERNIA REPAIR  07/23/2012   Procedure: HERNIA REPAIR UMBILICAL ADULT;  Surgeon: [name], MD;  Location: [facility];  Service: General;  Laterality: N/A;  Primary Umbilical Hernia Repair    Family History  Problem Relation Age of Onset  . Stroke Mother   . Heart disease Mother     Valvular heart disease,  Rheumatic fever  . Lung cancer Maternal Uncle     Social History   Social History  . Marital status: Married    Spouse name: N/A  . Number of children: 4  . Years of education: N/A   Occupational History  . Retired    Social History Main Topics  . Smoking status: Former Smoker    Types: Cigars    Quit date: 08/12/1999  . Smokeless tobacco: Never Used     Comment: smoked cigars on and off for approx 4-5 yrs  . Alcohol use No  . Drug use: No  . Sexual activity: Not Asked   Other Topics Concern  . None   Social History Narrative   Lives at home with wife      Jay Pulmonary (09/16/16):   Originally from [place]. Previously served & lived in [place], [place], & [place]. 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 He also worked in [employer]. No pets currently. Remote bird exposure. No mold or hot tub exposure. Enjoys playing banjo as well as hunting. He also raises cattle. Has carpet in his bedroom. No feather bedding. No indoor plants.       Objective:   Physical Exam BP 108/78 (BP Location: Right Arm, Patient Position: Sitting, Cuff Size: Large)   Pulse 61   Ht 6\' 1"  (1.854 m)   Wt 270 lb 12.8 oz (122.8 kg)   SpO2 97%   BMI 35.73 kg/m  General:  Awake. Alert. No acute distress.Moderate central obesity. Integument:  Warm & dry. No rash on exposed skin. No bruising on exposed skin. Extremities:  No cyanosis or clubbing.  Lymphatics:  No appreciated  cervical or supraclavicular lymphadenoapthy. HEENT:  Moist mucus membranes. No oral ulcers. No scleral injection or icterus. Minimal nasal turbinate swelling. Cardiovascular:  Regular rate and rhythm. No edema. No appreciable JVD.  Pulmonary:  Good aeration & clear to auscultation bilaterally. Symmetric chest wall expansion. No accessory muscle use. Intermittent nonproductive cough witnessed. Abdomen: Soft. Normal bowel sounds. Protuberant. Grossly nontender. Musculoskeletal:  Normal bulk and tone. Hand grip strength 5/5 bilaterally. No joint deformity or effusion appreciated. Neurological:  CN 2-12 grossly in tact. No meningismus. Moving all 4 extremities equally. Symmetric brachioradialis deep tendon reflexes. Psychiatric:  Mood and affect congruent. Speech normal rhythm, rate & tone.   PFT 03/19/13: FVC 4.00 L (83%) FEV1 3.18 L (87%) FEV1/FVC 0.80 FEF 25-75 3.16 L (101%)  CPET (05/17/16): No obvious cardiopulmonary limitation. Findings could indicate very mild diastolic dysfunction. Patient appeared to be primarily limited by body habitus.  IMAGING CXR PA/LAT 09/04/16 (personally reviewed by me):  No focal opacity or mass appreciated. No pleural effusion or thickening. Heart normal in size & mediastinum normal in contour. Somewhat low lung volumes.  CTA CHEST 03/19/15 (personally reviewed by me):  No pleural effusion or thickening. No pericardial effusion. No blood in mediastinal adenopathy. No pulmonary emboli. Some lower lobe predominance subpleural dependent reticulation consistent with atelectasis. 5 mm nodule in lingula.   CARDIAC TTE (03/19/15): LV normal in size and function with EF 50-55%. No regional wall motion abnormalities. Grade 1 diastolic dysfunction. LA & RA normal in size. RV normal in size and function. Trivial aortic regurgitation without stenosis. Aortic root normal in size. No mitral stenosis or regurgitation. Mild pulmonic regurgitation without stenosis. Trivial tricuspid  regurgitation. No pericardial effusion.    Assessment & Plan:  76 y.o. male with  a long-standing history of intermittent cough and dyspnea both at rest and on exertion. I suspect the patient has underlying asthma given his constellation of symptoms and previous response to immunotherapy. Overall his reflux seems to be well-controlled. Certainly there is likely some element of physical deconditioning but as he is limited by his joint pain I do question whether or not his symptoms would be more significant working more functional. I see no evidence of any parenchymal disease on his recent x-ray. We will need to perform complete pulmonary function testing to better assess his lung function.  1. Cough/dyspnea: Suspect underlying asthma. Checking full pulmonary function testing before next appointment. Empiric Treatment with Symbicort 160/4.5 with spacer. Holding off on albuterol rescue inhaler for now. Checking 6 minute walk test on room air before next appointment. 2. GERD: Continuing on Carafate and Zantac. No changes. 3. OSA: Reported excellent adherence to CPAP therapy. Continuing indefinitely. 4. Chronic allergic rhinitis: Starting patient on Singulair 10 mg by mouth daily at bedtime. Checking serum RAST panel. 5. Health maintenance: Status post influenza vaccine September 2017, Prevnar January 2015, Pneumovax October 2006, & Tdap October 2012. 6. Follow-up: Return to clinic in 4 weeks or sooner if needed.  Sonia Baller Ashok Cordia, M.D. [facility] Pulmonary [facility] Critical Care Pager:  [phone] After 3pm or if no response, call [phone] 2:03 PM 09/16/16

## 2016-09-17 LAB — RESPIRATORY ALLERGY PROFILE REGION II ~~LOC~~
Allergen, C. Herbarum, M2: <0.1 kU/L
Allergen, Cedar tree, t12: <0.1 kU/L
Allergen, Comm Silver Birch, t9: <0.1 kU/L
Allergen, Cottonwood, t14: <0.1 kU/L
Allergen, Mouse Urine Protein, e78: <0.1 kU/L
Allergen, Mulberry, t76: <0.1 kU/L
Allergen, P. notatum, m1: <0.1 kU/L
Box Elder IgE: <0.1 kU/L
Cockroach: 0.16 kU/L — ABNORMAL HIGH
Common Ragweed: <0.1 kU/L
Dog Dander: <0.1 kU/L
Elm IgE: <0.1 kU/L
IgE (Immunoglobulin E), Serum: 45 kU/L (ref <115)
Sheep Sorrel IgE: <0.1 kU/L

## 2016-09-17 NOTE — Progress Notes (Signed)
Cardiology Office Note   Date:  09/19/2016   ID:  [name], DOB July 04, 1940, MRN [id number]  PCP:  Redge Gainer, MD  Cardiologist:   [name], MD  Referring:   Redge Gainer, MD  Chief Complaint  Patient presents with  . Shortness of Breath      History of Present Illness: Cristian Moore is a 76 y.o. male who presents for follow up of dyspnea.  He has a history of DM, nonobstructive CAD by cath 2014.  He presented with SOB and had a [employer] which demonstrated an EF of 48% with a defect present in the basal inferior and mid inferior location. This is most likely due to artifact but  inferior ischemia could not be ruled out.  This was a low risk study.  Since I last saw him he saw Cristian Bayley NP secondary to wheezing.  He followed up with Dr. Ashok Cordia this week and is treated for asthma.  He feels much better since starting Symbicort.  The patient denies any new symptoms such as chest discomfort, neck or arm discomfort. There has been no PND or orthopnea. There have been no reported palpitations, presyncope or syncope.    Past Medical History:  Diagnosis Date  . Arthritis   . Benign prostatic hypertrophy   . Carotid stenosis    Carotid US (07/2013):  Bilateral 1-39% ICA (f/u 2 yrs)  . Chest pain    a. 01/2013 Cath: LM nl, LAD 25p, D1 25p, LCX nl, RCA 25p, PDA nl, RPL nl, EF 55%;  b. 03/2015 MV: EF 59%, no ischemia; c. 03/2016 MV: basal inf/mid inf defect, likely artifact, EF 48%, low risk.  . Chronic Dyspnea on exertion    a. See cardiac eval under CAD heading;  b. 03/2015 Echo: EF 50-55%, gr1 DD, triv AI;  b. 03/2015 CTA Chest: No PE;  c. 05/2016 CPX: no significant cardi-pulmonary limitation noted. Suspect obesity contributing to exertional symptoms.  . Diabetes mellitus without complication ([place]) 7616  . Difficulty urinating   . Diverticulitis   . Diverticulosis   . Exogenous obesity   . GERD (gastroesophageal reflux disease)   . H/O hiatal hernia   .  Hernia    Umbulical   . Hyperlipidemia   . Laceration  01/11/2009    Deep laceration left index finger dorsally  . Musculoskeletal pain    in the right shoulder - S/P ROTATOR CUFF REPAIR-BUT STILL HAS PAIN  . Obstructive sleep apnea     CPAP    AUTO SET 6 TO 20 - USUALLY SETTLES OUT AT 10  . Rectal bleeding    PT ATTRIBUTES TO HEMORRHOIDS  . SUPRAVENTRICULAR TACHYCARDIA 08/01/2009   Qualifier: Diagnosis of  By: Lovette Cliche, CNA, Christy    . Thyroiditis   . Ulcer ([place]) 2008    Past Surgical History:  Procedure Laterality Date  . COLONOSCOPY     for polyps  . ELBOW BURSA SURGERY    . Eyelid Surgery Bilateral 05/2013  . INGUINAL HERNIA REPAIR    . KIDNEY STONE SURGERY    . KNEE SURGERY    . LAPAROSCOPIC SIGMOID COLECTOMY  07/23/2012   Procedure: LAPAROSCOPIC SIGMOID COLECTOMY;  Surgeon: [name], MD;  Location: [facility];  Service: General;  Laterality: N/A;  Laparoscopic Sigmoid Colectomy  . LEFT HEART CATHETERIZATION WITH CORONARY ANGIOGRAM N/A 02/22/2013   Procedure: LEFT HEART CATHETERIZATION WITH CORONARY ANGIOGRAM;  Surgeon: [name], MD;  Location: [facility] CATH LAB;  Service:  Cardiovascular;  Laterality: N/A;  . PROCTOSCOPY  07/23/2012   Procedure: PROCTOSCOPY;  Surgeon: [name], MD;  Location: [facility];  Service: General;  Laterality: N/A;  Rigid Proctoscopy  . ROTATOR CUFF REPAIR    . TONSILLECTOMY    . UMBILICAL HERNIA REPAIR  07/23/2012   Procedure: HERNIA REPAIR UMBILICAL ADULT;  Surgeon: [name], MD;  Location: [facility];  Service: General;  Laterality: N/A;  Primary Umbilical Hernia Repair     Current Outpatient Prescriptions  Medication Sig Dispense Refill  . ALPRAZolam (XANAX) 0.25 MG tablet Take 1/2 tablet by mouth daily as needed     . aspirin 81 MG tablet Take 81 mg by mouth daily.    . budesonide-formoterol (SYMBICORT) 160-4.5 MCG/ACT inhaler Inhale 2 puffs into the lungs 2 (two) times daily. 1 Inhaler 0  . cholecalciferol (VITAMIN D) 1000 UNITS tablet  Take 2,000 Units by mouth daily.     . Coenzyme Q10 (CO Q-10) 100 MG CAPS Take 100 mg by mouth daily.    Marland Kitchen escitalopram (LEXAPRO) 10 MG tablet TAKE 1 TABLET BY MOUTH ONCE DAILY AS DIRECTED 90 tablet 3  . fluticasone (FLONASE) 50 MCG/ACT nasal spray USE 2 SPRAYS IN EACH  NOSTRIL DAILY 48 g 5  . metFORMIN (GLUCOPHAGE-XR) 500 MG 24 hr tablet Take 1 tablet (500 mg total) by mouth daily with breakfast. 90 tablet 0  . metoprolol (LOPRESSOR) 50 MG tablet Take 0.5 tablets (25 mg total) by mouth 2 (two) times daily. 90 tablet 3  . montelukast (SINGULAIR) 10 MG tablet Take 1 tablet (10 mg total) by mouth at bedtime. 30 tablet 3  . Multiple Vitamins-Minerals (CENTRUM SILVER PO) Take 1 tablet by mouth daily.    . nitroGLYCERIN (NITROSTAT) 0.4 MG SL tablet DISSOLVE ONE TABLET UNDER THE TONGUE EVERY 5 MINUTES AS NEEDED FOR CHEST PAIN.  DO NOT EXCEED A TOTAL OF 3 DOSES IN 15 MINUTES 25 tablet 11  . ranitidine (ZANTAC) 150 MG capsule Take 150 mg by mouth 2 (two) times daily.    . simvastatin (ZOCOR) 20 MG tablet TAKE 1 TABLET BY MOUTH AT  BEDTIME 90 tablet 1  . sodium chloride (OCEAN) 0.65 % nasal spray Place 1 spray into the nose as needed for congestion.    Marland Kitchen Spacer/Aero-Holding Chambers (AEROCHAMBER MV) inhaler Use as instructed 1 each 0  . tamsulosin (FLOMAX) 0.4 MG CAPS capsule Take 1 capsule (0.4 mg total) by mouth at bedtime. 90 capsule 3  . zolpidem (AMBIEN) 10 MG tablet Take 10 mg by mouth at bedtime as needed for sleep.    Marland Kitchen glucose blood (ONETOUCH VERIO) test strip Check BS BID and PRN. Dx.250.0 100 each 11  . ONETOUCH DELICA LANCETS 37T MISC Check BS BID and PRN. DX. E11.8 100 each 11  . PRESCRIPTION MEDICATION Inhale 1 application into the lungs at bedtime. "CPAP" MACHINE    . sucralfate (CARAFATE) 1 g tablet Take 1 tablet by mouth  twice a day (Patient not taking: Reported on 09/18/2016) 180 tablet 1  . zolpidem (AMBIEN) 10 MG tablet TAKE ONE TABLET AT BEDTIME 90 tablet 0   No current  facility-administered medications for this visit.     Allergies:   Penicillins    ROS:  Please see the history of present illness.   Otherwise, review of systems are positive for pain on the dorsum of the right foot .   All other systems are reviewed and negative.    PHYSICAL EXAM: VS:  BP 110/62  Pulse 64   Wt 268 lb (121.6 kg)   BMI 35.36 kg/m  , BMI Body mass index is 35.36 kg/m. GENERAL:  Well appearing NECK:  No jugular venous distention, waveform within normal limits, carotid upstroke brisk and symmetric, no bruits, no thyromegaly LUNGS:  Clear to auscultation bilaterally CHEST:  Unremarkable HEART:  PMI not displaced or sustained,S1 and S2 within normal limits, no S3, no S4, no clicks, no rubs, no murmurs ABD:  Flat, positive bowel sounds normal in frequency in pitch, no bruits, no rebound, no guarding, no midline pulsatile mass, no hepatomegaly, no splenomegaly EXT:  2 plus pulses throughout, no edema, no cyanosis no clubbing   EKG:  EKG is not  ordered today.   Recent Labs: 04/24/2016: ALT 15; BUN 13; Creatinine, Ser 1.05; Platelets 264; Potassium 4.5; Sodium 141    Lipid Panel    Component Value Date/Time   CHOL 150 04/24/2016 1012   TRIG 115 04/24/2016 1012   HDL 47 04/24/2016 1012   CHOLHDL 3.1 02/21/2013 0212   VLDL 21 02/21/2013 0212   LDLCALC 89 01/31/2014 1010      Wt Readings from Last 3 Encounters:  09/18/16 268 lb (121.6 kg)  09/16/16 270 lb 12.8 oz (122.8 kg)  09/04/16 269 lb 6.4 oz (122.2 kg)      Other studies Reviewed: Additional studies/ records that were reviewed today include: Pulm notes . Review of the above records demonstrates:   See above     ASSESSMENT AND PLAN:  DOE: I do not suspect an cardiac etiology.  No change in therapy is indicated.  He will follow for further pulmonary testing.   HTN:  The blood pressure is at target. No change in medications is indicated. We will continue with therapeutic lifestyle changes  (TLC).  Hyperlipidemia:  Per Dr. Laurance Flatten.    Carotid stenosis:    He had less than 50% bilateral stenosis this month and we can follow up in two years.     Current medicines are reviewed at length with the patient today.  The patient does not have concerns regarding medicines.  The following changes have been made:  no change  Labs/ tests ordered today include: None No orders of the defined types were placed in this encounter.   Follow up with me as needed.     Signed, [name], MD  09/19/2016 3:03 PM    [facility]

## 2016-09-18 ENCOUNTER — Ambulatory Visit (INDEPENDENT_AMBULATORY_CARE_PROVIDER_SITE_OTHER): Payer: Medicare Other | Admitting: Cardiology

## 2016-09-18 ENCOUNTER — Encounter: Payer: Self-pay | Admitting: Cardiology

## 2016-09-18 VITALS — BP 110/62 | HR 64 | Wt 268.0 lb

## 2016-09-18 DIAGNOSIS — R0602 Shortness of breath: Secondary | ICD-10-CM

## 2016-09-18 NOTE — Patient Instructions (Signed)
Medication Instructions:  The current medical regimen is effective;  continue present plan and medications.  Follow-Up: Follow up as needed with Dr Hochrein.  Thank you for choosing [facility]!!     

## 2016-09-19 ENCOUNTER — Encounter: Payer: Self-pay | Admitting: Cardiology

## 2016-09-20 ENCOUNTER — Ambulatory Visit (INDEPENDENT_AMBULATORY_CARE_PROVIDER_SITE_OTHER): Payer: Medicare Other

## 2016-09-20 ENCOUNTER — Encounter: Payer: Self-pay | Admitting: Family Medicine

## 2016-09-20 ENCOUNTER — Ambulatory Visit (INDEPENDENT_AMBULATORY_CARE_PROVIDER_SITE_OTHER): Payer: Medicare Other | Admitting: Family Medicine

## 2016-09-20 VITALS — BP 106/64 | HR 60 | Temp 96.9°F | Ht 73.0 in | Wt 268.0 lb

## 2016-09-20 DIAGNOSIS — E669 Obesity, unspecified: Secondary | ICD-10-CM

## 2016-09-20 DIAGNOSIS — M79672 Pain in left foot: Secondary | ICD-10-CM

## 2016-09-20 DIAGNOSIS — E559 Vitamin D deficiency, unspecified: Secondary | ICD-10-CM

## 2016-09-20 DIAGNOSIS — R0609 Other forms of dyspnea: Secondary | ICD-10-CM

## 2016-09-20 DIAGNOSIS — E7800 Pure hypercholesterolemia, unspecified: Secondary | ICD-10-CM | POA: Diagnosis not present

## 2016-09-20 DIAGNOSIS — I10 Essential (primary) hypertension: Secondary | ICD-10-CM | POA: Diagnosis not present

## 2016-09-20 DIAGNOSIS — R0600 Dyspnea, unspecified: Secondary | ICD-10-CM

## 2016-09-20 DIAGNOSIS — E118 Type 2 diabetes mellitus with unspecified complications: Secondary | ICD-10-CM

## 2016-09-20 LAB — BAYER DCA HB A1C WAIVED: HB A1C (BAYER DCA - WAIVED): 5.8 % (ref <7.0)

## 2016-09-20 NOTE — Progress Notes (Signed)
Subjective:    Patient ID: Cristian Moore, male    DOB: 1940/08/19, 76 y.o.   MRN: [id number]  HPI Pt here for follow up and management of chronic medical problems which includes diabetes, hyperlipidemia and hypertension. He is taking medication regularly.The patient comes in for his routine visit. He recently saw him the ophthalmologist and a copy of this will be scanned into his record. He also brings in blood sugars and blood pressures for review and the majority these are very good they will also be scanned into the record. The patient just this week saw the cardiologist and he does not think that his dyspnea is associated with anything cardiac. He is recommending that he see a pulmonologist. He did see Dr. Ashok Cordia Dr. Ashok Cordia thinks that his problem is underlying asthma. He is going to check full pulmonary function testing before his next appointment and start him on Symbicort 160/4.5 with a spacer. He will continue controlling his reflux from his stomach with Carafate and Zantac. He also recommended that he stay with his CPAP treatment. He is also starting him on some Singulair. Hopefully all of this pulmonary management will improve his pulmonary status and his breathing. The patient denies any chest pain or shortness of breath already seems better since he's been using the Symbicort. He has breathing function test to be done by the pulmonologist. He is had a thorough cardiac workup. Cardiologist feels good with this. He has no trouble with his intestinal tract including nausea vomiting diarrhea blood in the stool or black tarry bowel movements and is passing his water without problems. As mentioned he says that his breathing is already somewhat improved by using the Symbicort and taking the Singulair for allergies. He does complain with some left foot issues and it started a week or so ago. It started in the heel and now is hurting in the top of the foot. The patient did have recent carotid Doppler  studies and it showed bilaterally less than 50% blockages. We will repeat this study again in a couple of years. The patient will continue with aggressive therapeutic lifestyle changes.    Patient Active Problem List   Diagnosis Date Noted  . Cough 09/16/2016  . Chest pain 03/20/2015  . Chest tightness 03/19/2015  . Numbness of left hand 03/19/2015  . Insomnia 07/19/2014  . Vitamin D deficiency 07/19/2014  . BPH (benign prostatic hyperplasia) 07/19/2014  . DM (diabetes mellitus), type 2 with complications ([place]) 35/32/9924  . Acid reflux 06/03/2013  . 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 Personal history of other diseases of circulatory system 06/03/2013  . Loss of peripheral visual field 05/18/2013  . Non-Obstructive CAD 03/05/2013  . Essential hypertension 02/22/2013  . Shortness of breath   . Obstructive sleep apnea   . Diverticulitis, recurrent, s/p lap sigmoid colectomy 07/23/2012 06/02/2012  . Carotid artery stenosis 09/28/2009  . Obesity (BMI 30-39.9) 08/02/2009  . HLD (hyperlipidemia) 08/01/2009   Outpatient Encounter Prescriptions as of 09/20/2016  Medication Sig  . ALPRAZolam (XANAX) 0.25 MG tablet Take 1/2 tablet by mouth daily as needed   . aspirin 81 MG tablet Take 81 mg by mouth daily.  . budesonide-formoterol (SYMBICORT) 160-4.5 MCG/ACT inhaler Inhale 2 puffs into the lungs 2 (two) times daily.  . cholecalciferol (VITAMIN D) 1000 UNITS tablet Take 2,000 Units by mouth daily.   . Coenzyme Q10 (CO Q-10) 100 MG CAPS Take 100 mg by mouth daily.  Marland Kitchen escitalopram (LEXAPRO) 10 MG tablet TAKE 1 TABLET  BY MOUTH ONCE DAILY AS DIRECTED  . fluticasone (FLONASE) 50 MCG/ACT nasal spray USE 2 SPRAYS IN EACH  NOSTRIL DAILY  . glucose blood (ONETOUCH VERIO) test strip Check BS BID and PRN. Dx.250.0  . metFORMIN (GLUCOPHAGE-XR) 500 MG 24 hr tablet Take 1 tablet (500 mg total) by mouth daily with breakfast.  . metoprolol (LOPRESSOR) 50 MG tablet Take 0.5 tablets (25 mg total) by mouth 2  (two) times daily.  . montelukast (SINGULAIR) 10 MG tablet Take 1 tablet (10 mg total) by mouth at bedtime.  . Multiple Vitamins-Minerals (CENTRUM SILVER PO) Take 1 tablet by mouth daily.  . nitroGLYCERIN (NITROSTAT) 0.4 MG SL tablet DISSOLVE ONE TABLET UNDER THE TONGUE EVERY 5 MINUTES AS NEEDED FOR CHEST PAIN.  DO NOT EXCEED A TOTAL OF 3 DOSES IN 15 MINUTES  . ONETOUCH DELICA LANCETS 23F MISC Check BS BID and PRN. DX. E11.8  . PRESCRIPTION MEDICATION Inhale 1 application into the lungs at bedtime. "CPAP" MACHINE  . ranitidine (ZANTAC) 150 MG capsule Take 150 mg by mouth 2 (two) times daily.  . simvastatin (ZOCOR) 20 MG tablet TAKE 1 TABLET BY MOUTH AT  BEDTIME  . sodium chloride (OCEAN) 0.65 % nasal spray Place 1 spray into the nose as needed for congestion.  Marland Kitchen Spacer/Aero-Holding Chambers (AEROCHAMBER MV) inhaler Use as instructed  . sucralfate (CARAFATE) 1 g tablet Take 1 tablet by mouth  twice a day  . tamsulosin (FLOMAX) 0.4 MG CAPS capsule Take 1 capsule (0.4 mg total) by mouth at bedtime.  Marland Kitchen zolpidem (AMBIEN) 10 MG tablet TAKE ONE TABLET AT BEDTIME  . [DISCONTINUED] zolpidem (AMBIEN) 10 MG tablet Take 10 mg by mouth at bedtime as needed for sleep.   No facility-administered encounter medications on file as of 09/20/2016.      Review of Systems  Constitutional: Negative.   HENT: Negative.   Eyes: Negative.   Respiratory: Positive for cough (recent cough from new inhaler) and shortness of breath (worse with exertion -- seen Dr Percival Spanish and Dr Ashok Cordia).   Cardiovascular: Positive for leg swelling (left foot swelling some ).  Gastrointestinal: Negative.   Endocrine: Negative.   Genitourinary: Negative.   Musculoskeletal: Negative.   Skin: Negative.   Allergic/Immunologic: Negative.   Neurological: Negative.   Hematological: Negative.   Psychiatric/Behavioral: Negative.        Objective:   Physical Exam  Constitutional: He is oriented to person, place, and time. He appears  well-developed and well-nourished. No distress.  HENT:  Head: Normocephalic and atraumatic.  Right Ear: External ear normal.  Left Ear: External ear normal.  Mouth/Throat: Oropharynx is clear and moist. No oropharyngeal exudate.  Slight nasal congestion bilaterally  Eyes: Conjunctivae and EOM are normal. Pupils are equal, round, and reactive to light. Right eye exhibits no discharge. Left eye exhibits no discharge. No scleral icterus.  Neck: Normal range of motion. Neck supple. No thyromegaly present.  No bruits thyromegaly or anterior cervical adenopathy  Cardiovascular: Normal rate, regular rhythm, normal heart sounds and intact distal pulses.   No murmur heard. The heart has a regular rate and rhythm at 72/m  Pulmonary/Chest: Effort normal and breath sounds normal. No respiratory distress. He has no wheezes. He has no rales. He exhibits no tenderness.  Clear anteriorly and posteriorly and no axillary adenopathy. No wheezes audible. No congestion with coughing.  Abdominal: Soft. Bowel sounds are normal. He exhibits no mass. There is no tenderness. There is no rebound and no guarding.  Obesity without masses or  tenderness.  Musculoskeletal: Normal range of motion. He exhibits tenderness. He exhibits no edema.  There is some medial foot tenderness in the left foot and a little bit on the dorsal surface toward the outer side of the foot. There is no significant swelling no erythema and no specific joint tenderness other than that at the medial foot.  Lymphadenopathy:    He has no cervical adenopathy.  Neurological: He is alert and oriented to person, place, and time. He has normal reflexes. No cranial nerve deficit.  Skin: Skin is warm and dry. No rash noted.  Psychiatric: He has a normal mood and affect. His behavior is normal. Judgment and thought content normal.  Nursing note and vitals reviewed.   BP 106/64 (BP Location: Left Arm)   Pulse 60   Temp (!) 96.9 F (36.1 C) (Oral)   Ht  '6\' 1"'$  (1.854 m)   Wt 268 lb (121.6 kg)   BMI 35.36 kg/m        Assessment & Plan:  1. Type 2 diabetes mellitus with complication, without long-term current use of insulin (HCC) -Continue with aggressive therapeutic lifestyle changes and current medication along with weight loss. Lab work is pending. - CBC with Differential/Platelet - Bayer DCA Hb A1c Waived - Microalbumin / creatinine urine ratio  2. Essential hypertension -The blood pressure is good today and the patient will continue with current treatment - CBC with Differential/Platelet - BMP8+EGFR - Hepatic function panel  3. Pure hypercholesterolemia -Continue with current treatment and aggressive therapeutic lifestyle changes pending results of lab work - CBC with Differential/Platelet - NMR, lipoprofile  4. Vitamin D deficiency -Continue with current treatment - CBC with Differential/Platelet - VITAMIN D 25 Hydroxy (Vit-D Deficiency, Fractures)  5. Dyspnea on exertion -The patient says he is already feeling some better with using the Symbicort. The pulmonologist thinks that this may be some mild asthma and bronchospasm. He will continue to follow-up with him.  6. Obesity (BMI 30-39.9) -The patient was off for the opportunity to come in and talk to the dietitian/clinical pharmacist. He understands the importance of losing weight and getting his weight down and we'll try to do better with diet and exercise.  7. Left foot pain - DG Foot Complete Left; Future - Uric acid  8. Morbid obesity ([place]) -As above aggressive therapeutic lifestyle changes with weight loss and exercise. -The patient has a BMI that is 35.8 and he has 2 or more comorbidities risk factors which puts him in the morbid obesity category. He understands this.  Patient Instructions                       Medicare Annual Wellness Visit  [facility] and the medical providers at [facility] strive to bring you the best medical  care.  In doing so we not only want to address your current medical conditions and concerns but also to detect new conditions early and prevent illness, disease and health-related problems.    Medicare offers a yearly Wellness Visit which allows our clinical staff to assess your need for preventative services including immunizations, lifestyle education, counseling to decrease risk of preventable diseases and screening for fall risk and other medical concerns.    This visit is provided free of charge (no copay) for all Medicare recipients. The clinical pharmacists at [facility] have begun to conduct these Wellness Visits which will also include a thorough review of all your medications.  As you primary medical provider recommend that you make an appointment for your Annual Wellness Visit if you have not done so already this year.  You may set up this appointment before you leave today or you may call back ([phone]) and schedule an appointment.  Please make sure when you call that you mention that you are scheduling your Annual Wellness Visit with the clinical pharmacist so that the appointment may be made for the proper length of time.     Continue current medications. Continue good therapeutic lifestyle changes which include good diet and exercise. Fall precautions discussed with patient. If an FOBT was given today- please return it to our front desk. If you are over 90 years old - you may need Prevnar 54 or the adult Pneumonia vaccine.  **Flu shots are available--- please call and schedule a FLU-CLINIC appointment**  After your visit with [place] today you will receive a survey in the mail or online from [employer] regarding your care with [place]. Please take a moment to fill this out. Your feedback is very important to [place] as you can help [place] better understand your patient needs as well as improve your experience and satisfaction. WE CARE ABOUT YOU!!!   Follow-up with  pulmonology as planned for further studies Use inhaler and take Singulair as planned Continue to work aggressively on weight loss Feel comfortable to call back and make an appointment with the clinical pharmacists/dietitian if you need any help with calorie counting. We will call you with the results of the foot x-rays in the added on uric acid is sent as those results become available Continue to monitor blood sugars regularly  Arrie Senate MD

## 2016-09-20 NOTE — Patient Instructions (Addendum)
Medicare Annual Wellness Visit  [facility] and the medical providers at [facility] strive to bring you the best medical care.  In doing so we not only want to address your current medical conditions and concerns but also to detect new conditions early and prevent illness, disease and health-related problems.    Medicare offers a yearly Wellness Visit which allows our clinical staff to assess your need for preventative services including immunizations, lifestyle education, counseling to decrease risk of preventable diseases and screening for fall risk and other medical concerns.    This visit is provided free of charge (no copay) for all Medicare recipients. The clinical pharmacists at [facility] have begun to conduct these Wellness Visits which will also include a thorough review of all your medications.    As you primary medical provider recommend that you make an appointment for your Annual Wellness Visit if you have not done so already this year.  You may set up this appointment before you leave today or you may call back ([phone]) and schedule an appointment.  Please make sure when you call that you mention that you are scheduling your Annual Wellness Visit with the clinical pharmacist so that the appointment may be made for the proper length of time.     Continue current medications. Continue good therapeutic lifestyle changes which include good diet and exercise. Fall precautions discussed with patient. If an FOBT was given today- please return it to our front desk. If you are over 77 years old - you may need Prevnar 86 or the adult Pneumonia vaccine.  **Flu shots are available--- please call and schedule a FLU-CLINIC appointment**  After your visit with [place] today you will receive a survey in the mail or online from [employer] regarding your care with [place]. Please take a moment to fill this out. Your feedback is very  important to [place] as you can help [place] better understand your patient needs as well as improve your experience and satisfaction. WE CARE ABOUT YOU!!!   Follow-up with pulmonology as planned for further studies Use inhaler and take Singulair as planned Continue to work aggressively on weight loss Feel comfortable to call back and make an appointment with the clinical pharmacists/dietitian if you need any help with calorie counting. We will call you with the results of the foot x-rays in the added on uric acid is sent as those results become available Continue to monitor blood sugars regularly

## 2016-09-21 LAB — CBC WITH DIFFERENTIAL/PLATELET
BASOS: 1 %
Basophils Absolute: 0.1 10*3/uL (ref 0.0–0.2)
EOS (ABSOLUTE): 0.6 10*3/uL — ABNORMAL HIGH (ref 0.0–0.4)
EOS: 7 %
HEMATOCRIT: 41.1 % (ref 37.5–51.0)
HEMOGLOBIN: 13.2 g/dL (ref 13.0–17.7)
IMMATURE GRANULOCYTES: 0 %
Immature Grans (Abs): 0 10*3/uL (ref 0.0–0.1)
LYMPHS ABS: 2.1 10*3/uL (ref 0.7–3.1)
Lymphs: 26 %
MCH: 30.5 pg (ref 26.6–33.0)
MCHC: 32.1 g/dL (ref 31.5–35.7)
MCV: 95 fL (ref 79–97)
MONOCYTES: 9 %
MONOS ABS: 0.7 10*3/uL (ref 0.1–0.9)
Neutrophils Absolute: 4.6 10*3/uL (ref 1.4–7.0)
Neutrophils: 57 %
Platelets: 293 10*3/uL (ref 150–379)
RBC: 4.33 x10E6/uL (ref 4.14–5.80)
RDW: 13.7 % (ref 12.3–15.4)
WBC: 8.1 10*3/uL (ref 3.4–10.8)

## 2016-09-21 LAB — NMR, LIPOPROFILE
Cholesterol: 109 mg/dL (ref 100–199)
HDL CHOLESTEROL BY NMR: 41 mg/dL (ref >39)
HDL PARTICLE NUMBER: 30.7 umol/L (ref >=30.5)
LDL PARTICLE NUMBER: 658 nmol/L (ref <1000)
LDL Size: 20.1 nm (ref >20.5)
LDL-C: 51 mg/dL (ref 0–99)
LP-IR Score: 55 — ABNORMAL HIGH (ref <=45)
Small LDL Particle Number: 467 nmol/L (ref <=527)
Triglycerides by NMR: 87 mg/dL (ref 0–149)

## 2016-09-21 LAB — BMP8+EGFR
BUN/Creatinine Ratio: 13 (ref 10–24)
BUN: 14 mg/dL (ref 8–27)
CALCIUM: 9.7 mg/dL (ref 8.6–10.2)
CO2: 26 mmol/L (ref 18–29)
CREATININE: 1.05 mg/dL (ref 0.76–1.27)
Chloride: 99 mmol/L (ref 96–106)
GFR, EST AFRICAN AMERICAN: 79 mL/min/{1.73_m2} (ref >59)
GFR, EST NON AFRICAN AMERICAN: 69 mL/min/{1.73_m2} (ref >59)
Glucose: 97 mg/dL (ref 65–99)
POTASSIUM: 5 mmol/L (ref 3.5–5.2)
Sodium: 141 mmol/L (ref 134–144)

## 2016-09-21 LAB — HEPATIC FUNCTION PANEL
ALBUMIN: 4.2 g/dL (ref 3.5–4.8)
ALK PHOS: 47 IU/L (ref 39–117)
ALT: 15 IU/L (ref 0–44)
AST: 16 IU/L (ref 0–40)
BILIRUBIN TOTAL: 0.4 mg/dL (ref 0.0–1.2)
Bilirubin, Direct: 0.12 mg/dL (ref 0.00–0.40)
Total Protein: 6.5 g/dL (ref 6.0–8.5)

## 2016-09-21 LAB — MICROALBUMIN / CREATININE URINE RATIO
CREATININE, UR: 80.2 mg/dL
MICROALB/CREAT RATIO: 8 mg/g{creat} (ref 0.0–30.0)
Microalbumin, Urine: 6.4 ug/mL

## 2016-09-21 LAB — URIC ACID: URIC ACID: 6.2 mg/dL (ref 3.7–8.6)

## 2016-09-21 LAB — VITAMIN D 25 HYDROXY (VIT D DEFICIENCY, FRACTURES): Vit D, 25-Hydroxy: 47.5 ng/mL (ref 30.0–100.0)

## 2016-10-04 ENCOUNTER — Ambulatory Visit (INDEPENDENT_AMBULATORY_CARE_PROVIDER_SITE_OTHER): Payer: Medicare Other | Admitting: Pulmonary Disease

## 2016-10-04 DIAGNOSIS — R059 Cough, unspecified: Secondary | ICD-10-CM

## 2016-10-04 DIAGNOSIS — R05 Cough: Secondary | ICD-10-CM

## 2016-10-04 DIAGNOSIS — R0600 Dyspnea, unspecified: Secondary | ICD-10-CM

## 2016-10-04 LAB — PULMONARY FUNCTION TEST
DL/VA % PRED: 70 %
DL/VA: 3.35 ml/min/mmHg/L
DLCO UNC: 22.52 ml/min/mmHg
DLCO unc % pred: 61 %

## 2016-10-04 NOTE — Progress Notes (Signed)
PFT attempted today. DLCO and N2 washout completed.

## 2016-10-08 ENCOUNTER — Other Ambulatory Visit: Payer: Self-pay | Admitting: *Deleted

## 2016-10-08 ENCOUNTER — Other Ambulatory Visit: Payer: Self-pay | Admitting: Family Medicine

## 2016-10-08 DIAGNOSIS — I10 Essential (primary) hypertension: Secondary | ICD-10-CM

## 2016-10-08 DIAGNOSIS — I2510 Atherosclerotic heart disease of native coronary artery without angina pectoris: Secondary | ICD-10-CM

## 2016-10-08 MED ORDER — METFORMIN HCL ER 500 MG PO TB24: 500.0000 mg | ORAL_TABLET | Freq: Every day | ORAL | 1 refills | Status: DC

## 2016-10-09 NOTE — Progress Notes (Signed)
6MWT 10/04/16:  Walked 432 meters / Baseline Sat 96% on RA / Nadir Sat 96% on RA @ start of test (increased to 98% by end of test)

## 2016-10-16 DIAGNOSIS — M5416 Radiculopathy, lumbar region: Secondary | ICD-10-CM | POA: Diagnosis not present

## 2016-10-21 ENCOUNTER — Other Ambulatory Visit: Payer: Self-pay | Admitting: Family Medicine

## 2016-10-23 ENCOUNTER — Ambulatory Visit: Payer: Medicare Other | Admitting: Pulmonary Disease

## 2016-12-05 ENCOUNTER — Other Ambulatory Visit: Payer: Self-pay | Admitting: Family Medicine

## 2016-12-06 ENCOUNTER — Ambulatory Visit (INDEPENDENT_AMBULATORY_CARE_PROVIDER_SITE_OTHER): Payer: Medicare Other | Admitting: Pulmonary Disease

## 2016-12-06 ENCOUNTER — Encounter: Payer: Self-pay | Admitting: Pulmonary Disease

## 2016-12-06 VITALS — BP 114/64 | HR 55 | Ht 73.0 in | Wt 269.0 lb

## 2016-12-06 DIAGNOSIS — K219 Gastro-esophageal reflux disease without esophagitis: Secondary | ICD-10-CM

## 2016-12-06 DIAGNOSIS — R911 Solitary pulmonary nodule: Secondary | ICD-10-CM | POA: Diagnosis not present

## 2016-12-06 DIAGNOSIS — IMO0001 Reserved for inherently not codable concepts without codable children: Secondary | ICD-10-CM

## 2016-12-06 DIAGNOSIS — J984 Other disorders of lung: Secondary | ICD-10-CM

## 2016-12-06 DIAGNOSIS — J309 Allergic rhinitis, unspecified: Secondary | ICD-10-CM

## 2016-12-06 DIAGNOSIS — J4520 Mild intermittent asthma, uncomplicated: Secondary | ICD-10-CM | POA: Diagnosis not present

## 2016-12-06 NOTE — Progress Notes (Signed)
Subjective:    Patient ID: Cristian Moore, male    DOB: 12/23/1940, 76 y.o.   MRN: [id number]  C.C.:  Follow-up for Probable Asthma, Mild Restrictive Lung Disease, Chronic Allergic Rhinitis, GERD, Lingula Nodule, & OSA.  HPI Probable Asthma:  Patient started on Symbicort 160/4.5 and Singulair at last appointment. The patient reports he has had "2 episodes" of shortness of breath since his last appointment. He reports he has been using the Symbicort only as needed. He does feel it has significantly helped. He has had some mild, intermittent coughing & wheezing.   Mild Restrictive Lung Disease: Noted in all lung volumes performed after last appointment. No evidence of significant desaturation or oxygen requirement during 6 minute walk test despite reduced carbon monoxide diffusion capacity.  Chronic Allergic Rhinitis:  Started on Singulair at last appointment. He reports he is using Flonase nightly. He does have some mild sinus congestion still but no pressure or pain.   Lingula Nodule: Seen on chest CT imaging and 2016. Measured 5 mm previously. Also some associated atelectasis.Does have remote history of pneumonia.  GERD: Previously on Carafate and Zantac. No reflux or dyspepsia. No morning brash water taste. He reports he is only using the Zantac & Carafate intermittently.   OSA:  Prescribed CPAP. Patient reports continued adherence to CPAP therapy.  Review of Systems No chest pain or pressure. No fever or chills. No rashes or abnormal bruising.   Allergies  Allergen Reactions  . Penicillins Rash    Over 60 years ago    Current Outpatient Prescriptions on File Prior to Visit  Medication Sig Dispense Refill  . ALPRAZolam (XANAX) 0.25 MG tablet Take 1/2 tablet by mouth daily as needed     . aspirin 81 MG tablet Take 81 mg by mouth daily.    . budesonide-formoterol (SYMBICORT) 160-4.5 MCG/ACT inhaler Inhale 2 puffs into the lungs 2 (two) times daily. 1 Inhaler 0  . cholecalciferol  (VITAMIN D) 1000 UNITS tablet Take 2,000 Units by mouth daily.     . Coenzyme Q10 (CO Q-10) 100 MG CAPS Take 100 mg by mouth daily.    Marland Kitchen escitalopram (LEXAPRO) 10 MG tablet TAKE 1 TABLET BY MOUTH ONCE DAILY AS DIRECTED 90 tablet 3  . fluticasone (FLONASE) 50 MCG/ACT nasal spray USE 2 SPRAYS IN EACH  NOSTRIL DAILY 48 g 5  . metFORMIN (GLUCOPHAGE-XR) 500 MG 24 hr tablet Take 1 tablet (500 mg total) by mouth daily with breakfast. 90 tablet 1  . metoprolol (LOPRESSOR) 50 MG tablet TAKE ONE-HALF TABLET BY  MOUTH TWICE A DAY 90 tablet 1  . montelukast (SINGULAIR) 10 MG tablet Take 1 tablet (10 mg total) by mouth at bedtime. 30 tablet 3  . Multiple Vitamins-Minerals (CENTRUM SILVER PO) Take 1 tablet by mouth daily.    . nitroGLYCERIN (NITROSTAT) 0.4 MG SL tablet DISSOLVE ONE TABLET UNDER THE TONGUE EVERY 5 MINUTES AS NEEDED FOR CHEST PAIN.  DO NOT EXCEED A TOTAL OF 3 DOSES IN 15 MINUTES 25 tablet 11  . ONETOUCH DELICA LANCETS 95K MISC Check BS BID and PRN. DX. E11.8 100 each 11  . ONETOUCH VERIO test strip USE TO CHECK BLOOD SUGAR TWICE DAILY AND AS NEEDED 100 each 11  . PRESCRIPTION MEDICATION Inhale 1 application into the lungs at bedtime. "CPAP" MACHINE    . ranitidine (ZANTAC) 150 MG capsule Take 150 mg by mouth 2 (two) times daily.    . simvastatin (ZOCOR) 20 MG tablet TAKE 1 TABLET BY MOUTH  AT  BEDTIME 90 tablet 1  . sodium chloride (OCEAN) 0.65 % nasal spray Place 1 spray into the nose as needed for congestion.    Marland Kitchen Spacer/Aero-Holding Chambers (AEROCHAMBER MV) inhaler Use as instructed 1 each 0  . sucralfate (CARAFATE) 1 g tablet Take 1 tablet by mouth  twice a day 180 tablet 1  . tamsulosin (FLOMAX) 0.4 MG CAPS capsule Take 1 capsule (0.4 mg total) by mouth at bedtime. 90 capsule 3  . zolpidem (AMBIEN) 10 MG tablet TAKE ONE TABLET AT BEDTIME 90 tablet 0   No current facility-administered medications on file prior to visit.     Past Medical History:  Diagnosis Date  . Arthritis   . Benign  prostatic hypertrophy   . Carotid stenosis    Carotid US (07/2013):  Bilateral 1-39% ICA (f/u 2 yrs)  . Chest pain    a. 01/2013 Cath: LM nl, LAD 25p, D1 25p, LCX nl, RCA 25p, PDA nl, RPL nl, EF 55%;  b. 03/2015 MV: EF 59%, no ischemia; c. 03/2016 MV: basal inf/mid inf defect, likely artifact, EF 48%, low risk.  . Chronic Dyspnea on exertion    a. See cardiac eval under CAD heading;  b. 03/2015 Echo: EF 50-55%, gr1 DD, triv AI;  b. 03/2015 CTA Chest: No PE;  c. 05/2016 CPX: no significant cardi-pulmonary limitation noted. Suspect obesity contributing to exertional symptoms.  . Diabetes mellitus without complication ([place]) 1478  . Difficulty urinating   . Diverticulitis   . Diverticulosis   . Exogenous obesity   . GERD (gastroesophageal reflux disease)   . H/O hiatal hernia   . Hernia    Umbulical   . Hyperlipidemia   . Laceration  01/11/2009    Deep laceration left index finger dorsally  . Musculoskeletal pain    in the right shoulder - S/P ROTATOR CUFF REPAIR-BUT STILL HAS PAIN  . Obstructive sleep apnea     CPAP    AUTO SET 6 TO 20 - USUALLY SETTLES OUT AT 10  . Rectal bleeding    PT ATTRIBUTES TO HEMORRHOIDS  . SUPRAVENTRICULAR TACHYCARDIA 08/01/2009   Qualifier: Diagnosis of  By: Lovette Cliche, CNA, Christy    . Thyroiditis   . Ulcer 2008    Past Surgical History:  Procedure Laterality Date  . COLONOSCOPY     for polyps  . ELBOW BURSA SURGERY    . Eyelid Surgery Bilateral 05/2013  . INGUINAL HERNIA REPAIR    . KIDNEY STONE SURGERY    . KNEE SURGERY    . LAPAROSCOPIC SIGMOID COLECTOMY  07/23/2012   Procedure: LAPAROSCOPIC SIGMOID COLECTOMY;  Surgeon: [name], MD;  Location: [facility];  Service: General;  Laterality: N/A;  Laparoscopic Sigmoid Colectomy  . LEFT HEART CATHETERIZATION WITH CORONARY ANGIOGRAM N/A 02/22/2013   Procedure: LEFT HEART CATHETERIZATION WITH CORONARY ANGIOGRAM;  Surgeon: [name], MD;  Location: [facility] CATH LAB;  Service: Cardiovascular;  Laterality: N/A;  .  PROCTOSCOPY  07/23/2012   Procedure: PROCTOSCOPY;  Surgeon: [name], MD;  Location: [facility];  Service: General;  Laterality: N/A;  Rigid Proctoscopy  . ROTATOR CUFF REPAIR    . TONSILLECTOMY    . UMBILICAL HERNIA REPAIR  07/23/2012   Procedure: HERNIA REPAIR UMBILICAL ADULT;  Surgeon: [name], MD;  Location: [facility];  Service: General;  Laterality: N/A;  Primary Umbilical Hernia Repair    Family History  Problem Relation Age of Onset  . Stroke Mother   . Heart disease Mother  Valvular heart disease, Rheumatic fever  . Lung cancer Maternal Uncle     Social History   Social History  . Marital status: Married    Spouse name: N/A  . Number of children: 4  . Years of education: N/A   Occupational History  . Retired    Social History Main Topics  . Smoking status: Former Smoker    Types: Cigars    Quit date: 08/12/1999  . Smokeless tobacco: Never Used     Comment: smoked cigars on and off for approx 4-5 yrs  . Alcohol use No  . Drug use: No  . Sexual activity: Not Asked   Other Topics Concern  . None   Social History Narrative   Lives at home with wife      Uncertain Pulmonary (09/16/16):   Originally from [place]. Previously served & lived in [place], [place], & [place]. 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 He also worked in [employer]. No pets currently. Remote bird exposure. No mold or hot tub exposure. Enjoys playing banjo as well as hunting. He also raises cattle. Has carpet in his bedroom. No feather bedding. No indoor plants.       Objective:   Physical Exam BP 114/64 (BP Location: Left Arm, Cuff Size: Normal)   Pulse (!) 55   Ht 6\' 1"  (1.854 m)   Wt 269 lb (122 kg)   SpO2 98%   BMI 35.49 kg/m  General:  Awake. Alert. No distress. Central obesity. Integument:  Warm & dry. No rash on exposed skin.  Extremities:  No cyanosis or clubbing.  HEENT:  Moist mucus membranes. Mild bilateral  nasal torted swelling. No oral ulcers. Cardiovascular:  Regular rate. No edema. No appreciable JVD given body positioning.  Pulmonary:  Slightly diminished breath sounds in the bases but otherwise clear to auscultation. No accessory muscle use on room air. Abdomen: Soft. Normal bowel sounds. Protuberant. Musculoskeletal:  Normal bulk and tone. No joint deformity or effusion appreciated.  PFT 10/04/16:                                                                                                                     TLC 5.48 L (71%) RV 76% ERV 32% DLCO uncorrected 61% 03/19/13: FVC 4.00 L (83%) FEV1 3.18 L (87%) FEV1/FVC 0.80 FEF 25-75 3.16 L (101%)  6MWT 10/04/16:  Walked 432 meters / Baseline Sat 96% on RA / Nadir Sat 96% on RA @ start of test (increased to 98% by end of test)  CPET (05/17/16): No obvious cardiopulmonary limitation. Findings could indicate very mild diastolic dysfunction. Patient appeared to be primarily limited by body habitus.  IMAGING CXR PA/LAT 09/04/16 (previously reviewed by me):  No focal opacity or mass appreciated. No pleural effusion or thickening. Heart normal in size & mediastinum normal in contour. Somewhat low lung volumes.  CTA CHEST 03/19/15 (previously reviewed by me):  No pleural  effusion or thickening. No pericardial effusion. No blood in mediastinal adenopathy. No pulmonary emboli. Some lower lobe predominance subpleural dependent reticulation consistent with atelectasis. 5 mm nodule in lingula.   CARDIAC TTE (03/19/15): LV normal in size and function with EF 50-55%. No regional wall motion abnormalities. Grade 1 diastolic dysfunction. LA & RA normal in size. RV normal in size and function. Trivial aortic regurgitation without stenosis. Aortic root normal in size. No mitral stenosis or regurgitation. Mild pulmonic regurgitation without stenosis. Trivial tricuspid regurgitation. No pericardial effusion.  LABS 09/16/16 IgE: 45 RAST panel: Cockroach 0.16      Assessment & Plan:  76 y.o. male with probable asthma, GERD, chronic allergic rhinitis, and OSA. Patient's lung volumes show mild restriction with an associated mild reduction in carbon monoxide diffusion capacity which was uncorrected for hemoglobin. I suspect the patient's restriction is secondary to his central obesity. We did discuss his previous lingula nodule and we will be repeating CT imaging. Overall his reflux and allergic rhinitis seems to be well-controlled. Hopefully with the addition of Singulair to his regimen this will be sufficient to control his asthma but I did caution him against the potential need to reinitiate inhaled corticosteroid therapy. He'll contact my office if he has any new breathing problems or questions before his next appointment.   1. Mild, intermittent asthma: Continuing Singulair. Patient advised to restart Symbicort if symptoms recur. 2. Mild restrictive lung disease: Likely secondary to obesity. Holding off on further testing. 3. GERD: Controlled with intermittent use of Carafate and Zantac. No new medications. 4. Chronic allergic rhinitis: Controlled with Flonase and Singulair. No new medications. 5. Lung nodule: Repeat CT chest without contrast. 6. OSA: Continuing CPAP indefinitely. 7. Health maintenance: Status post influenza vaccine September 2017, Prevnar January 2015, Pneumovax October 2006, & Tdap October 2012. 8. Follow-up: Return to clinic in 6 months or sooner if needed.  Sonia Baller Ashok Cordia, M.D. [facility] Pager:  [phone] After 3pm or if no response, call [phone] 11:16 AM 12/06/16

## 2016-12-06 NOTE — Patient Instructions (Addendum)
   Continue taking your medications as prescribed.  You can hold off on your Symbicort but if you start to cough or wheeze more or notice more shortness of breath restart this inhaler daily.   I will see you back in 6 months or sooner if needed.  We will contact you with your CT result.  TESTS ORDERED: 1. CT Chest w/o

## 2016-12-11 ENCOUNTER — Ambulatory Visit (HOSPITAL_COMMUNITY)
Admission: RE | Admit: 2016-12-11 | Discharge: 2016-12-11 | Disposition: A | Discharge status: Home / Self Care | Payer: Medicare Other | Source: Ambulatory Visit | Attending: Pulmonary Disease | Admitting: Pulmonary Disease

## 2016-12-11 DIAGNOSIS — J439 Emphysema, unspecified: Secondary | ICD-10-CM | POA: Insufficient documentation

## 2016-12-11 DIAGNOSIS — R911 Solitary pulmonary nodule: Secondary | ICD-10-CM | POA: Insufficient documentation

## 2016-12-11 DIAGNOSIS — R918 Other nonspecific abnormal finding of lung field: Secondary | ICD-10-CM | POA: Diagnosis not present

## 2016-12-11 DIAGNOSIS — K7689 Other specified diseases of liver: Secondary | ICD-10-CM | POA: Diagnosis not present

## 2016-12-11 DIAGNOSIS — IMO0001 Reserved for inherently not codable concepts without codable children: Secondary | ICD-10-CM

## 2016-12-11 DIAGNOSIS — I700 Atherosclerosis of aorta: Secondary | ICD-10-CM | POA: Insufficient documentation

## 2016-12-18 DIAGNOSIS — D225 Melanocytic nevi of trunk: Secondary | ICD-10-CM | POA: Diagnosis not present

## 2016-12-18 DIAGNOSIS — D1801 Hemangioma of skin and subcutaneous tissue: Secondary | ICD-10-CM | POA: Diagnosis not present

## 2016-12-18 DIAGNOSIS — L570 Actinic keratosis: Secondary | ICD-10-CM | POA: Diagnosis not present

## 2016-12-18 DIAGNOSIS — L821 Other seborrheic keratosis: Secondary | ICD-10-CM | POA: Diagnosis not present

## 2016-12-23 ENCOUNTER — Telehealth: Payer: Self-pay | Admitting: Pulmonary Disease

## 2016-12-23 NOTE — Progress Notes (Signed)
See phone note dated 12/23/16

## 2016-12-23 NOTE — Telephone Encounter (Signed)
Cristian Glazier, MD sent to Tyson Dense, RN        Please let the patient know that his CT scan was reviewed and the nodule hasn't changed in size. We will review his test results further at his follow-up appointment. Thank you.    LMTCB

## 2016-12-23 NOTE — Progress Notes (Signed)
Left message for patient to contact office for results.

## 2016-12-24 NOTE — Telephone Encounter (Signed)
Spoke with pt. He is aware of results. Nothing further was needed. 

## 2017-01-06 ENCOUNTER — Telehealth: Payer: Self-pay | Admitting: Pulmonary Disease

## 2017-01-06 NOTE — Telephone Encounter (Signed)
A new prescription request fax was sent to [facility] at [phone] for montelukast 10mg  tablets. The directions states to take one tablet by month at night with qty 30 and 11 refills. A confirmation fax sheet was received. Nothing further is needed.

## 2017-01-15 ENCOUNTER — Ambulatory Visit (INDEPENDENT_AMBULATORY_CARE_PROVIDER_SITE_OTHER): Payer: Medicare Other | Admitting: Family Medicine

## 2017-01-15 ENCOUNTER — Encounter: Payer: Self-pay | Admitting: Family Medicine

## 2017-01-15 VITALS — BP 125/75 | HR 63 | Temp 99.0°F | Ht 73.0 in | Wt 266.6 lb

## 2017-01-15 DIAGNOSIS — J0100 Acute maxillary sinusitis, unspecified: Secondary | ICD-10-CM

## 2017-01-15 MED ORDER — AZITHROMYCIN 250 MG PO TABS: ORAL_TABLET | ORAL | 0 refills | Status: DC

## 2017-01-15 NOTE — Progress Notes (Signed)
Subjective:    Patient ID: Cristian Moore, male    DOB: June 14, 1941, 76 y.o.   MRN: [id number]  HPI 4-5 day history of left maxillary sinus pain pressure with headache and yellow to green drainage. He denies any cough. He did get some relief after hot shower. Pain exacerbated by forward bending.  Patient Active Problem List   Diagnosis Date Noted  . Cough 09/16/2016  . Chest pain 03/20/2015  . Chest tightness 03/19/2015  . Numbness of left hand 03/19/2015  . Insomnia 07/19/2014  . Vitamin D deficiency 07/19/2014  . BPH (benign prostatic hyperplasia) 07/19/2014  . DM (diabetes mellitus), type 2 with complications ([place]) 48/54/6270  . Acid reflux 06/03/2013  . 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 Personal history of other diseases of circulatory system 06/03/2013  . Loss of peripheral visual field 05/18/2013  . Non-Obstructive CAD 03/05/2013  . Essential hypertension 02/22/2013  . Shortness of breath   . Obstructive sleep apnea   . Diverticulitis, recurrent, s/p lap sigmoid colectomy 07/23/2012 06/02/2012  . Carotid artery stenosis 09/28/2009  . Obesity (BMI 30-39.9) 08/02/2009  . HLD (hyperlipidemia) 08/01/2009   Outpatient Encounter Prescriptions as of 01/15/2017  Medication Sig  . aspirin 81 MG tablet Take 81 mg by mouth daily.  . cholecalciferol (VITAMIN D) 1000 UNITS tablet Take 2,000 Units by mouth daily.   . Coenzyme Q10 (CO Q-10) 100 MG CAPS Take 100 mg by mouth daily.  Marland Kitchen escitalopram (LEXAPRO) 10 MG tablet TAKE 1 TABLET BY MOUTH ONCE DAILY AS DIRECTED  . fluticasone (FLONASE) 50 MCG/ACT nasal spray USE 2 SPRAYS IN EACH  NOSTRIL DAILY  . metFORMIN (GLUCOPHAGE-XR) 500 MG 24 hr tablet Take 1 tablet (500 mg total) by mouth daily with breakfast.  . metoprolol (LOPRESSOR) 50 MG tablet TAKE ONE-HALF TABLET BY  MOUTH TWICE A DAY  . montelukast (SINGULAIR) 10 MG tablet Take 1 tablet (10 mg total) by mouth at bedtime.  . Multiple Vitamins-Minerals (CENTRUM SILVER PO) Take 1 tablet by  mouth daily.  . nitroGLYCERIN (NITROSTAT) 0.4 MG SL tablet DISSOLVE ONE TABLET UNDER THE TONGUE EVERY 5 MINUTES AS NEEDED FOR CHEST PAIN.  DO NOT EXCEED A TOTAL OF 3 DOSES IN 15 MINUTES  . ONETOUCH DELICA LANCETS 35K MISC Check BS BID and PRN. DX. E11.8  . ONETOUCH VERIO test strip USE TO CHECK BLOOD SUGAR TWICE DAILY AND AS NEEDED  . PRESCRIPTION MEDICATION Inhale 1 application into the lungs at bedtime. "CPAP" MACHINE  . ranitidine (ZANTAC) 150 MG capsule Take 150 mg by mouth 2 (two) times daily.  . simvastatin (ZOCOR) 20 MG tablet TAKE 1 TABLET BY MOUTH AT  BEDTIME  . sodium chloride (OCEAN) 0.65 % nasal spray Place 1 spray into the nose as needed for congestion.  Marland Kitchen Spacer/Aero-Holding Chambers (AEROCHAMBER MV) inhaler Use as instructed  . sucralfate (CARAFATE) 1 g tablet Take 1 tablet by mouth  twice a day  . tamsulosin (FLOMAX) 0.4 MG CAPS capsule Take 1 capsule (0.4 mg total) by mouth at bedtime.  Marland Kitchen zolpidem (AMBIEN) 10 MG tablet TAKE ONE TABLET AT BEDTIME  . ALPRAZolam (XANAX) 0.25 MG tablet Take 1/2 tablet by mouth daily as needed   . budesonide-formoterol (SYMBICORT) 160-4.5 MCG/ACT inhaler Inhale 2 puffs into the lungs 2 (two) times daily. (Patient not taking: Reported on 01/15/2017)   No facility-administered encounter medications on file as of 01/15/2017.       Review of Systems  HENT: Positive for sinus pain and sinus pressure.  Neurological: Positive for headaches.  All other systems reviewed and are negative.      Objective:   Physical Exam  Constitutional: He appears well-developed and well-nourished.  HENT:  Both tympanic membranes are dull suggestive of a serous effusion. There is tenderness over the left maxillary sinus. There is evidence of postnasal drainage in the back of his throat  Pulmonary/Chest: Effort normal.    BP 125/75   Pulse 63   Temp 99 F (37.2 C) (Oral)   Ht 6\' 1"  (1.854 m)   Wt 266 lb 9.6 oz (120.9 kg)   BMI 35.17 kg/m           Assessment & Plan:  Sinus pressure and headache consistent with sinusitis. Continue Mucinex and hot showers. Z-Pak (take as directed)

## 2017-01-22 ENCOUNTER — Encounter: Payer: Self-pay | Admitting: Family Medicine

## 2017-01-22 ENCOUNTER — Ambulatory Visit (INDEPENDENT_AMBULATORY_CARE_PROVIDER_SITE_OTHER): Payer: Medicare Other | Admitting: Family Medicine

## 2017-01-22 VITALS — BP 114/62 | HR 59 | Temp 97.6°F | Ht 73.0 in | Wt 266.0 lb

## 2017-01-22 DIAGNOSIS — N400 Enlarged prostate without lower urinary tract symptoms: Secondary | ICD-10-CM

## 2017-01-22 DIAGNOSIS — R351 Nocturia: Secondary | ICD-10-CM

## 2017-01-22 DIAGNOSIS — E118 Type 2 diabetes mellitus with unspecified complications: Secondary | ICD-10-CM | POA: Diagnosis not present

## 2017-01-22 DIAGNOSIS — Z0000 Encounter for general adult medical examination without abnormal findings: Secondary | ICD-10-CM

## 2017-01-22 DIAGNOSIS — R3915 Urgency of urination: Secondary | ICD-10-CM

## 2017-01-22 DIAGNOSIS — E559 Vitamin D deficiency, unspecified: Secondary | ICD-10-CM

## 2017-01-22 DIAGNOSIS — E7800 Pure hypercholesterolemia, unspecified: Secondary | ICD-10-CM

## 2017-01-22 DIAGNOSIS — I10 Essential (primary) hypertension: Secondary | ICD-10-CM

## 2017-01-22 LAB — MICROSCOPIC EXAMINATION
BACTERIA UA: NONE SEEN
Epithelial Cells (non renal): NONE SEEN /hpf (ref 0–10)
WBC, UA: NONE SEEN /hpf (ref 0–?)

## 2017-01-22 LAB — URINALYSIS, COMPLETE
BILIRUBIN UA: NEGATIVE
GLUCOSE, UA: NEGATIVE
Ketones, UA: NEGATIVE
LEUKOCYTES UA: NEGATIVE
Nitrite, UA: NEGATIVE
PH UA: 5 (ref 5.0–7.5)
PROTEIN UA: NEGATIVE
Specific Gravity, UA: 1.015 (ref 1.005–1.030)
Urobilinogen, Ur: 0.2 mg/dL (ref 0.2–1.0)

## 2017-01-22 LAB — BAYER DCA HB A1C WAIVED: HB A1C: 6 % (ref <7.0)

## 2017-01-22 NOTE — Progress Notes (Signed)
Subjective:    Patient ID: Cristian Moore, male    DOB: 04/21/1941, 76 y.o.   MRN: [id number]  HPI Patient is here today for annual wellness exam and follow up of chronic medical problems which includes diabetes, hypertension, and hyperlipidemia. He is taking medication regularly.The patient is doing well overall. He does complain of bilateral knee pain and is seeing the physical therapist and this seems to be better with physical therapy. He also complains of nocturia. He's had lab work done. He had a CT scan done of his chest and June of this year. He will be given an FOBT to return. His last colonoscopy was in 2012. He is a diabetic. He does take multiple meds for several medical problems and issues. The patient has a history of hypertension hyperlipidemia and diabetes. He takes metformin regularly and he takes simvastatin for his cholesterol. He is currently using Flomax for his BPH. The patient is doing well overall. The physical therapy has definite help his knee pain and he is going to continue with that. He does complain with the nocturia and he does take Flomax but this seems to be getting worse with 2-3 times nightly of having to get up to void and having more daytime urgency. He denies any chest pain pressure or shortness of breath. He believes that getting his asthma under better control with the pulmonologist, Dr. Ashok Cordia using Symbicort and Singulair have helped his chest discomfort. He is currently not doing Symbicort but only doing Singulair. He denies any heartburn indigestion nausea vomiting diarrhea or black tarry bowel movements. He does have occasional bright red blood (is coming from hemorrhoids. There is no family history of colon cancer. He's passing his water as indicated above with frequency and urgency and especially at nighttime but no burning. His blood sugars have been running fasting about 112 and using no more than 120 during the day. He does get his eye exams  regularly.      Patient Active Problem List   Diagnosis Date Noted  . Cough 09/16/2016  . Chest pain 03/20/2015  . Chest tightness 03/19/2015  . Numbness of left hand 03/19/2015  . Insomnia 07/19/2014  . Vitamin D deficiency 07/19/2014  . BPH (benign prostatic hyperplasia) 07/19/2014  . DM (diabetes mellitus), type 2 with complications ([place]) 18/56/3149  . Acid reflux 06/03/2013  . 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 Personal history of other diseases of circulatory system 06/03/2013  . Loss of peripheral visual field 05/18/2013  . Non-Obstructive CAD 03/05/2013  . Essential hypertension 02/22/2013  . Shortness of breath   . Obstructive sleep apnea   . Diverticulitis, recurrent, s/p lap sigmoid colectomy 07/23/2012 06/02/2012  . Carotid artery stenosis 09/28/2009  . Obesity (BMI 30-39.9) 08/02/2009  . HLD (hyperlipidemia) 08/01/2009   Outpatient Encounter Prescriptions as of 01/22/2017  Medication Sig  . ALPRAZolam (XANAX) 0.25 MG tablet Take 1/2 tablet by mouth daily as needed   . aspirin 81 MG tablet Take 81 mg by mouth daily.  . cholecalciferol (VITAMIN D) 1000 UNITS tablet Take 2,000 Units by mouth daily.   . Coenzyme Q10 (CO Q-10) 100 MG CAPS Take 100 mg by mouth daily.  Marland Kitchen escitalopram (LEXAPRO) 10 MG tablet TAKE 1 TABLET BY MOUTH ONCE DAILY AS DIRECTED  . fluticasone (FLONASE) 50 MCG/ACT nasal spray USE 2 SPRAYS IN EACH  NOSTRIL DAILY  . metFORMIN (GLUCOPHAGE-XR) 500 MG 24 hr tablet Take 1 tablet (500 mg total) by mouth daily with breakfast.  . metoprolol (  LOPRESSOR) 50 MG tablet TAKE ONE-HALF TABLET BY  MOUTH TWICE A DAY  . montelukast (SINGULAIR) 10 MG tablet Take 1 tablet (10 mg total) by mouth at bedtime.  . Multiple Vitamins-Minerals (CENTRUM SILVER PO) Take 1 tablet by mouth daily.  . nitroGLYCERIN (NITROSTAT) 0.4 MG SL tablet DISSOLVE ONE TABLET UNDER THE TONGUE EVERY 5 MINUTES AS NEEDED FOR CHEST PAIN.  DO NOT EXCEED A TOTAL OF 3 DOSES IN 15 MINUTES  . ONETOUCH  DELICA LANCETS 48N MISC Check BS BID and PRN. DX. E11.8  . ONETOUCH VERIO test strip USE TO CHECK BLOOD SUGAR TWICE DAILY AND AS NEEDED  . PRESCRIPTION MEDICATION Inhale 1 application into the lungs at bedtime. "CPAP" MACHINE  . ranitidine (ZANTAC) 150 MG capsule Take 150 mg by mouth 2 (two) times daily.  . simvastatin (ZOCOR) 20 MG tablet TAKE 1 TABLET BY MOUTH AT  BEDTIME  . sodium chloride (OCEAN) 0.65 % nasal spray Place 1 spray into the nose as needed for congestion.  Marland Kitchen Spacer/Aero-Holding Chambers (AEROCHAMBER MV) inhaler Use as instructed  . sucralfate (CARAFATE) 1 g tablet Take 1 tablet by mouth  twice a day  . tamsulosin (FLOMAX) 0.4 MG CAPS capsule Take 1 capsule (0.4 mg total) by mouth at bedtime.  Marland Kitchen zolpidem (AMBIEN) 10 MG tablet TAKE ONE TABLET AT BEDTIME  . [DISCONTINUED] budesonide-formoterol (SYMBICORT) 160-4.5 MCG/ACT inhaler Inhale 2 puffs into the lungs 2 (two) times daily.  . [DISCONTINUED] azithromycin (ZITHROMAX Z-PAK) 250 MG tablet Take as directed   No facility-administered encounter medications on file as of 01/22/2017.      Review of Systems  Constitutional: Negative.   HENT: Negative.   Eyes: Negative.   Respiratory: Negative.   Cardiovascular: Negative.   Gastrointestinal: Negative.   Endocrine: Negative.   Genitourinary: Positive for frequency (at night ).  Musculoskeletal: Positive for arthralgias (recent bilateral knee pain - going to PT).  Skin: Negative.   Allergic/Immunologic: Negative.   Neurological: Negative.   Hematological: Negative.   Psychiatric/Behavioral: Negative.        Objective:   Physical Exam  Constitutional: He is oriented to person, place, and time. He appears well-developed and well-nourished. No distress.  Pleasant and alert  HENT:  Head: Normocephalic and atraumatic.  Right Ear: External ear normal.  Left Ear: External ear normal.  Nose: Nose normal.  Mouth/Throat: Oropharynx is clear and moist. No oropharyngeal exudate.   Eyes: Pupils are equal, round, and reactive to light. Conjunctivae and EOM are normal. Right eye exhibits no discharge. Left eye exhibits no discharge. No scleral icterus.  Neck: Normal range of motion. Neck supple. No thyromegaly present.  No bruits thyromegaly or anterior cervical adenopathy  Cardiovascular: Normal rate, regular rhythm, normal heart sounds and intact distal pulses.   No murmur heard. The heart has a regular rate and rhythm at 72/m  Pulmonary/Chest: Effort normal and breath sounds normal. No respiratory distress. He has no wheezes. He has no rales. He exhibits no tenderness.  Clear anteriorly and posteriorly and no chest wall masses or axillary adenopathy  Abdominal: Soft. Bowel sounds are normal. He exhibits no mass. There is no tenderness. There is no rebound and no guarding.  Abdominal obesity without masses tenderness or organ enlargement or bruits. No inguinal adenopathy.  Genitourinary: Rectum normal and penis normal.  Genitourinary Comments: The prostate is slightly enlarged but smooth to palpation and there were no rectal masses. There are no inguinal hernias palpable. The external genitalia were within normal limits.  Musculoskeletal: Normal  range of motion. He exhibits no edema or tenderness.  Some bilateral knee discomfort mostly over the patella area with the left being greater than the right.  Lymphadenopathy:    He has no cervical adenopathy.  Neurological: He is alert and oriented to person, place, and time. He has normal reflexes. No cranial nerve deficit.  Skin: Skin is warm and dry. No rash noted.  Psychiatric: He has a normal mood and affect. His behavior is normal. Judgment and thought content normal.  Nursing note and vitals reviewed.   BP 114/62 (BP Location: Left Arm)   Pulse (!) 59   Temp 97.6 F (36.4 C) (Oral)   Ht 6\' 1"  (1.854 m)   Wt 266 lb (120.7 kg)   BMI 35.09 kg/m        Assessment & Plan:  1. Annual physical exam -Because of  nocturia and increased urgency symptoms, we will arrange for a visit with the urologist for follow-up and management. -Continue with physical therapy for knee pain and problems and mobility - CBC with Differential/Platelet - BMP8+EGFR - PSA, total and free - NMR, lipoprofile - VITAMIN D 25 Hydroxy (Vit-D Deficiency, Fractures) - Hepatic function panel - Bayer DCA Hb A1c Waived - Urinalysis, Complete  2. Type 2 diabetes mellitus with complication, without long-term current use of insulin (HCC) -Continue to monitor blood sugars regularly and make all efforts at losing weight - CBC with Differential/Platelet - Bayer DCA Hb A1c Waived  3. Essential hypertension -Blood pressure is good today and he will continue with current treatment - CBC with Differential/Platelet - BMP8+EGFR - Hepatic function panel  4. Pure hypercholesterolemia -Continue with cholesterol treatment and aggressive therapeutic lifestyle changes - CBC with Differential/Platelet - NMR, lipoprofile  5. Vitamin D deficiency -Continue with current treatment pending results of lab work - CBC with Differential/Platelet - VITAMIN D 25 Hydroxy (Vit-D Deficiency, Fractures)  6. Benign prostatic hyperplasia, unspecified whether lower urinary tract symptoms present -The patient is having more problems with nocturia and urgency and he does have BPH and we will do a referral to the urologist for further evaluation. - CBC with Differential/Platelet - PSA, total and free - Urinalysis, Complete  7. Nocturia -Check urinalysis and refer to urology  8. Urgency of urination -Check urinalysis and refer to urology and continue with Flomax  9. Morbid obesity (HCC) -The patient has morbid obesity based on 2 or more comorbidities risk factors and a BMI greater than 35. -He will try to do his best with diet and exercise.  Patient Instructions                       Medicare Annual Wellness Visit  [place] and the medical  providers at [facility] strive to bring you the best medical care.  In doing so we not only want to address your current medical conditions and concerns but also to detect new conditions early and prevent illness, disease and health-related problems.    Medicare offers a yearly Wellness Visit which allows our clinical staff to assess your need for preventative services including immunizations, lifestyle education, counseling to decrease risk of preventable diseases and screening for fall risk and other medical concerns.    This visit is provided free of charge (no copay) for all Medicare recipients. The clinical pharmacists at [facility] have begun to conduct these Wellness Visits which will also include a thorough review of all your medications.    As  you primary medical provider recommend that you make an appointment for your Annual Wellness Visit if you have not done so already this year.  You may set up this appointment before you leave today or you may call back ([phone]) and schedule an appointment.  Please make sure when you call that you mention that you are scheduling your Annual Wellness Visit with the clinical pharmacist so that the appointment may be made for the proper length of time.     Continue current medications. Continue good therapeutic lifestyle changes which include good diet and exercise. Fall precautions discussed with patient. If an FOBT was given today- please return it to our front desk. If you are over 53 years old - you may need Prevnar 25 or the adult Pneumonia vaccine.  **Flu shots are available--- please call and schedule a FLU-CLINIC appointment**  After your visit with [place] today you will receive a survey in the mail or online from [employer] regarding your care with [place]. Please take a moment to fill this out. Your feedback is very important to [place] as you can help [place] better understand your patient needs as well as  improve your experience and satisfaction. WE CARE ABOUT YOU!!!   Continue to make every effort at losing weight through diet exercise and drinking more water We will arrange she have an appointment with the urologist because of your worsening nocturia and urgency Return the FOBT card     Arrie Senate MD

## 2017-01-22 NOTE — Addendum Note (Signed)
Addended by: Zannie Cove on: 01/22/2017 11:33 AM   Modules accepted: Orders

## 2017-01-22 NOTE — Patient Instructions (Addendum)
Medicare Annual Wellness Visit  [facility] and the medical providers at [facility] strive to bring you the best medical care.  In doing so we not only want to address your current medical conditions and concerns but also to detect new conditions early and prevent illness, disease and health-related problems.    Medicare offers a yearly Wellness Visit which allows our clinical staff to assess your need for preventative services including immunizations, lifestyle education, counseling to decrease risk of preventable diseases and screening for fall risk and other medical concerns.    This visit is provided free of charge (no copay) for all Medicare recipients. The clinical pharmacists at [facility] have begun to conduct these Wellness Visits which will also include a thorough review of all your medications.    As you primary medical provider recommend that you make an appointment for your Annual Wellness Visit if you have not done so already this year.  You may set up this appointment before you leave today or you may call back ([phone]) and schedule an appointment.  Please make sure when you call that you mention that you are scheduling your Annual Wellness Visit with the clinical pharmacist so that the appointment may be made for the proper length of time.     Continue current medications. Continue good therapeutic lifestyle changes which include good diet and exercise. Fall precautions discussed with patient. If an FOBT was given today- please return it to our front desk. If you are over 67 years old - you may need Prevnar 54 or the adult Pneumonia vaccine.  **Flu shots are available--- please call and schedule a FLU-CLINIC appointment**  After your visit with [place] today you will receive a survey in the mail or online from [employer] regarding your care with [place]. Please take a moment to fill this out. Your feedback is very  important to [place] as you can help [place] better understand your patient needs as well as improve your experience and satisfaction. WE CARE ABOUT YOU!!!   Continue to make every effort at losing weight through diet exercise and drinking more water We will arrange she have an appointment with the urologist because of your worsening nocturia and urgency Return the FOBT card

## 2017-01-23 LAB — CBC WITH DIFFERENTIAL/PLATELET
Basophils Absolute: 0.1 10*3/uL (ref 0.0–0.2)
Basos: 1 %
EOS (ABSOLUTE): 0.2 10*3/uL (ref 0.0–0.4)
Eos: 3 %
Hematocrit: 42.7 % (ref 37.5–51.0)
Hemoglobin: 14.3 g/dL (ref 13.0–17.7)
IMMATURE GRANS (ABS): 0.1 10*3/uL (ref 0.0–0.1)
Immature Granulocytes: 1 %
LYMPHS: 29 %
Lymphocytes Absolute: 2.1 10*3/uL (ref 0.7–3.1)
MCH: 31.6 pg (ref 26.6–33.0)
MCHC: 33.5 g/dL (ref 31.5–35.7)
MCV: 95 fL (ref 79–97)
MONOS ABS: 1 10*3/uL — AB (ref 0.1–0.9)
Monocytes: 13 %
NEUTROS PCT: 53 %
Neutrophils Absolute: 3.8 10*3/uL (ref 1.4–7.0)
PLATELETS: 310 10*3/uL (ref 150–379)
RBC: 4.52 x10E6/uL (ref 4.14–5.80)
RDW: 13.4 % (ref 12.3–15.4)
WBC: 7.3 10*3/uL (ref 3.4–10.8)

## 2017-01-23 LAB — HEPATIC FUNCTION PANEL
ALT: 16 IU/L (ref 0–44)
AST: 18 IU/L (ref 0–40)
Albumin: 4.3 g/dL (ref 3.5–4.8)
Alkaline Phosphatase: 44 IU/L (ref 39–117)
BILIRUBIN TOTAL: 0.3 mg/dL (ref 0.0–1.2)
Bilirubin, Direct: 0.09 mg/dL (ref 0.00–0.40)
Total Protein: 6.6 g/dL (ref 6.0–8.5)

## 2017-01-23 LAB — BMP8+EGFR
BUN/Creatinine Ratio: 11 (ref 10–24)
BUN: 13 mg/dL (ref 8–27)
CHLORIDE: 102 mmol/L (ref 96–106)
CO2: 25 mmol/L (ref 20–29)
Calcium: 9.8 mg/dL (ref 8.6–10.2)
Creatinine, Ser: 1.14 mg/dL (ref 0.76–1.27)
GFR calc non Af Amer: 62 mL/min/{1.73_m2} (ref >59)
GFR, EST AFRICAN AMERICAN: 72 mL/min/{1.73_m2} (ref >59)
GLUCOSE: 105 mg/dL — AB (ref 65–99)
POTASSIUM: 4.8 mmol/L (ref 3.5–5.2)
Sodium: 140 mmol/L (ref 134–144)

## 2017-01-23 LAB — VITAMIN D 25 HYDROXY (VIT D DEFICIENCY, FRACTURES): Vit D, 25-Hydroxy: 42.8 ng/mL (ref 30.0–100.0)

## 2017-01-23 LAB — NMR, LIPOPROFILE
CHOLESTEROL: 128 mg/dL (ref 100–199)
HDL Cholesterol by NMR: 39 mg/dL — ABNORMAL LOW (ref >39)
HDL PARTICLE NUMBER: 31.9 umol/L (ref >=30.5)
LDL Particle Number: 1064 nmol/L — ABNORMAL HIGH (ref <1000)
LDL SIZE: 20.5 nm (ref >20.5)
LDL-C: 67 mg/dL (ref 0–99)
LP-IR SCORE: 77 — AB (ref <=45)
Small LDL Particle Number: 443 nmol/L (ref <=527)
TRIGLYCERIDES BY NMR: 111 mg/dL (ref 0–149)

## 2017-01-23 LAB — PSA, TOTAL AND FREE
PROSTATE SPECIFIC AG, SERUM: 1.1 ng/mL (ref 0.0–4.0)
PSA FREE PCT: 32.7 %
PSA FREE: 0.36 ng/mL

## 2017-01-27 ENCOUNTER — Other Ambulatory Visit: Payer: Medicare Other

## 2017-01-27 DIAGNOSIS — Z1211 Encounter for screening for malignant neoplasm of colon: Secondary | ICD-10-CM

## 2017-01-28 LAB — FECAL OCCULT BLOOD, IMMUNOCHEMICAL: FECAL OCCULT BLD: NEGATIVE

## 2017-02-18 ENCOUNTER — Other Ambulatory Visit: Payer: Self-pay | Admitting: Family Medicine

## 2017-03-04 ENCOUNTER — Other Ambulatory Visit: Payer: Self-pay | Admitting: *Deleted

## 2017-03-05 MED ORDER — ZOLPIDEM TARTRATE 10 MG PO TABS: 10.0000 mg | ORAL_TABLET | Freq: Every day | ORAL | 1 refills | Status: DC

## 2017-03-06 ENCOUNTER — Other Ambulatory Visit: Payer: Self-pay | Admitting: *Deleted

## 2017-03-06 NOTE — Progress Notes (Signed)
Alprazolam is okay to refill this patient

## 2017-03-12 ENCOUNTER — Telehealth: Payer: Self-pay | Admitting: Family Medicine

## 2017-03-12 MED ORDER — ALPRAZOLAM 0.25 MG PO TABS: ORAL_TABLET | ORAL | 1 refills | Status: DC

## 2017-03-12 NOTE — Telephone Encounter (Signed)
Phoned in.

## 2017-03-17 DIAGNOSIS — N401 Enlarged prostate with lower urinary tract symptoms: Secondary | ICD-10-CM | POA: Diagnosis not present

## 2017-03-17 DIAGNOSIS — R350 Frequency of micturition: Secondary | ICD-10-CM | POA: Diagnosis not present

## 2017-03-17 DIAGNOSIS — N3281 Overactive bladder: Secondary | ICD-10-CM | POA: Diagnosis not present

## 2017-04-11 ENCOUNTER — Ambulatory Visit (INDEPENDENT_AMBULATORY_CARE_PROVIDER_SITE_OTHER): Payer: Medicare Other | Admitting: *Deleted

## 2017-04-11 DIAGNOSIS — Z23 Encounter for immunization: Secondary | ICD-10-CM | POA: Diagnosis not present

## 2017-05-22 ENCOUNTER — Other Ambulatory Visit: Payer: Self-pay | Admitting: Family Medicine

## 2017-05-22 DIAGNOSIS — I2510 Atherosclerotic heart disease of native coronary artery without angina pectoris: Secondary | ICD-10-CM

## 2017-05-22 DIAGNOSIS — I10 Essential (primary) hypertension: Secondary | ICD-10-CM

## 2017-05-23 NOTE — Telephone Encounter (Signed)
OV 05/29/17

## 2017-05-29 ENCOUNTER — Encounter: Payer: Self-pay | Admitting: Family Medicine

## 2017-05-29 ENCOUNTER — Encounter: Payer: Self-pay | Admitting: *Deleted

## 2017-05-29 ENCOUNTER — Ambulatory Visit (INDEPENDENT_AMBULATORY_CARE_PROVIDER_SITE_OTHER): Payer: Medicare Other | Admitting: Family Medicine

## 2017-05-29 VITALS — BP 132/78 | HR 66 | Temp 97.0°F | Ht 73.0 in | Wt 267.0 lb

## 2017-05-29 DIAGNOSIS — E559 Vitamin D deficiency, unspecified: Secondary | ICD-10-CM

## 2017-05-29 DIAGNOSIS — R351 Nocturia: Secondary | ICD-10-CM | POA: Diagnosis not present

## 2017-05-29 DIAGNOSIS — E782 Mixed hyperlipidemia: Secondary | ICD-10-CM

## 2017-05-29 DIAGNOSIS — E118 Type 2 diabetes mellitus with unspecified complications: Secondary | ICD-10-CM

## 2017-05-29 DIAGNOSIS — N400 Enlarged prostate without lower urinary tract symptoms: Secondary | ICD-10-CM

## 2017-05-29 DIAGNOSIS — K219 Gastro-esophageal reflux disease without esophagitis: Secondary | ICD-10-CM

## 2017-05-29 DIAGNOSIS — I10 Essential (primary) hypertension: Secondary | ICD-10-CM

## 2017-05-29 NOTE — Progress Notes (Signed)
Subjective:    Patient ID: Cristian Moore, male    DOB: 1941/01/21, 76 y.o.   MRN: [id number]  HPI Pt here for follow up and management of chronic medical problems which includes diabetes, hypertension and hyperlipidemia. He is taking medication regularly.  It is important to note that the patient had a CT scan in June of this year for follow-up of pulmonary nodules.  No new pulmonary lesions were identified or acute pulmonary findings.  There were mild emphysematous changes and no mediastinal or hilar masses or adenopathy identified there was a stable hepatic cyst.  The pulmonary nodules that were bilateral that were present since 2013 are considered benign.  The patient has had lab work done at the [place].  The outside labs revealed an LDL see cholesterol which is actually low and good at 41.  The HDL cholesterol is good at 47 triglycerides however are elevated at 190.  The PSA was within normal limits at 1.40.  The hemoglobin A1c was good at 6.2%.  All of the electrolytes were good including the creatinine.  The blood sugar was slightly elevated at 101.  All liver function tests were within normal limits except one was slightly decreased which was the alkaline phosphatase.  The urinalysis was negative for blood and there was no microscopic exam done.  The lab work brought in from the [place] will be scanned into his record.  I will give him a copy of the last CT scan report.  The patient does complain of bilateral knee pain.  He also has 1 side of his breast or chest wall that seems to be larger than the other one.  He is requesting refills on his Ambien.  All of the lab results were reviewed with the patient.  He has an understanding of all of them.  He is also seeing the [place] orthopedic doctor who has put injections in both knees and this is helped tremendously and did not affect his blood sugar that much.  He does bring in blood sugars for review and generally they are running between 110 at 120 range during the  day and fasting with one outlier that was 156.  He denies chest pain and shortness of breath.  He does have some occasional reflux and takes ranitidine for that.  He does see occasional bright red blood in the stool.  His last colonoscopy was 2012 and was normal.  He was told at that time he did not need another colonoscopy for 10 years.  He denies any other problems with his bowel habits or GI tract.  He does have some problems with passing his water but since starting Flomax he only gets up once at nighttime.  His frequency has diminished.      Patient Active Problem List   Diagnosis Date Noted  . Cough 09/16/2016  . Chest pain 03/20/2015  . Chest tightness 03/19/2015  . Numbness of left hand 03/19/2015  . Insomnia 07/19/2014  . Vitamin D deficiency 07/19/2014  . BPH (benign prostatic hyperplasia) 07/19/2014  . DM (diabetes mellitus), type 2 with complications ([place]) 04/27/2535  . Acid reflux 06/03/2013  . 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 Personal history of other diseases of circulatory system 06/03/2013  . Loss of peripheral visual field 05/18/2013  . Non-Obstructive CAD 03/05/2013  . Essential hypertension 02/22/2013  . Shortness of breath   . Obstructive sleep apnea   . Diverticulitis, recurrent, s/p lap sigmoid colectomy 07/23/2012 06/02/2012  . Carotid artery stenosis 09/28/2009  .  Obesity (BMI 30-39.9) 08/02/2009  . HLD (hyperlipidemia) 08/01/2009   Outpatient Encounter Medications as of 05/29/2017  Medication Sig  . ALPRAZolam (XANAX) 0.25 MG tablet Take 1/2 tablet by mouth daily as needed  . aspirin 81 MG tablet Take 81 mg by mouth daily.  . cholecalciferol (VITAMIN D) 1000 UNITS tablet Take 2,000 Units by mouth daily.   . Coenzyme Q10 (CO Q-10) 100 MG CAPS Take 100 mg by mouth daily.  Marland Kitchen escitalopram (LEXAPRO) 10 MG tablet TAKE 1 TABLET BY MOUTH ONCE DAILY AS DIRECTED  . finasteride (PROSCAR) 5 MG tablet Take 1 tablet by mouth daily.  . fluticasone (FLONASE) 50 MCG/ACT  nasal spray USE 2 SPRAYS IN EACH  NOSTRIL DAILY  . metFORMIN (GLUCOPHAGE-XR) 500 MG 24 hr tablet TAKE 1 TABLET BY MOUTH  DAILY WITH BREAKFAST  . metoprolol tartrate (LOPRESSOR) 50 MG tablet TAKE ONE-HALF TABLET BY  MOUTH TWICE A DAY  . montelukast (SINGULAIR) 10 MG tablet Take 1 tablet (10 mg total) by mouth at bedtime.  . Multiple Vitamins-Minerals (CENTRUM SILVER PO) Take 1 tablet by mouth daily.  . nitroGLYCERIN (NITROSTAT) 0.4 MG SL tablet DISSOLVE ONE TABLET UNDER THE TONGUE EVERY 5 MINUTES AS NEEDED FOR CHEST PAIN.  DO NOT EXCEED A TOTAL OF 3 DOSES IN 15 MINUTES  . ONETOUCH DELICA LANCETS 36R MISC Check BS BID and PRN. DX. E11.8  . ONETOUCH VERIO test strip USE TO CHECK BLOOD SUGAR TWICE DAILY AND AS NEEDED  . PRESCRIPTION MEDICATION Inhale 1 application into the lungs at bedtime. "CPAP" MACHINE  . ranitidine (ZANTAC) 150 MG capsule Take 150 mg by mouth 2 (two) times daily.  . simvastatin (ZOCOR) 20 MG tablet TAKE 1 TABLET BY MOUTH AT  BEDTIME  . sodium chloride (OCEAN) 0.65 % nasal spray Place 1 spray into the nose as needed for congestion.  Marland Kitchen Spacer/Aero-Holding Chambers (AEROCHAMBER MV) inhaler Use as instructed  . sucralfate (CARAFATE) 1 g tablet Take 1 tablet by mouth  twice a day  . tamsulosin (FLOMAX) 0.4 MG CAPS capsule Take 0.8 mg by mouth daily after supper.  . zolpidem (AMBIEN) 10 MG tablet Take 1 tablet (10 mg total) by mouth at bedtime.  . [DISCONTINUED] tamsulosin (FLOMAX) 0.4 MG CAPS capsule Take 1 capsule (0.4 mg total) by mouth at bedtime.   No facility-administered encounter medications on file as of 05/29/2017.      Review of Systems  Constitutional: Negative.   HENT: Negative.   Eyes: Negative.   Respiratory: Negative.   Cardiovascular: Negative.   Gastrointestinal: Negative.   Endocrine: Negative.   Genitourinary: Negative.   Musculoskeletal: Positive for arthralgias (bilateral knee pain - [facility]) and back pain (recent DR Ramos  injection ).  Skin: Negative.   Allergic/Immunologic: Negative.   Neurological: Negative.   Hematological: Negative.   Psychiatric/Behavioral: Negative.        Objective:   Physical Exam  Constitutional: He is oriented to person, place, and time. He appears well-developed and well-nourished. No distress.  HENT:  Head: Normocephalic and atraumatic.  Right Ear: External ear normal.  Left Ear: External ear normal.  Nose: Nose normal.  Mouth/Throat: Oropharynx is clear and moist. No oropharyngeal exudate.  Eyes: Conjunctivae and EOM are normal. Pupils are equal, round, and reactive to light. Right eye exhibits no discharge. Left eye exhibits no discharge. No scleral icterus.  Neck: Normal range of motion. Neck supple. No thyromegaly present.  No bruits thyromegaly or anterior cervical adenopathy  Cardiovascular:  Normal rate, regular rhythm, normal heart sounds and intact distal pulses. Exam reveals no friction rub.  No murmur heard. The heart has a regular rate and rhythm at 60/min  Pulmonary/Chest: Effort normal and breath sounds normal. No respiratory distress. He has no wheezes. He has no rales. He exhibits no tenderness.  Chest wall was normal slight breast asymmetry with right being bigger than the left but no masses were palpated and there was no axillary adenopathy.  Lungs were clear anteriorly and posteriorly.  Abdominal: Soft. Bowel sounds are normal. He exhibits no mass. There is no tenderness. There is no rebound and no guarding.  Abdominal obesity without masses tenderness or organ enlargement or bruits  Musculoskeletal: Normal range of motion. He exhibits no edema.  Dry skin with calluses on both heels  Lymphadenopathy:    He has no cervical adenopathy.  Neurological: He is alert and oriented to person, place, and time. He has normal reflexes. No cranial nerve deficit.  Skin: Skin is warm and dry. No rash noted.  Psychiatric: He has a normal mood and affect. His behavior is  normal. Judgment and thought content normal.  Nursing note and vitals reviewed.  BP 132/78 (BP Location: Left Arm)   Pulse 66   Temp (!) 97 F (36.1 C) (Oral)   Ht 6\' 1"  (1.854 m)   Wt 267 lb (121.1 kg)   BMI 35.23 kg/m        Assessment & Plan:  1. Type 2 diabetes mellitus with complication, without long-term current use of insulin (HCC) -The patient's hemoglobin A1c was good.  This was brought in as an outside reading at 6.2%.  2. Essential hypertension -The blood pressure is good he will continue with current treatment  3. Mixed hyperlipidemia -The LDL C was good and at goal he will continue with current treatment.  His triglycerides were elevated.  He needs to work more aggressively on weight loss through diet and exercise he understands this but just cannot seem to get motivated to do as much as he needs to do.  4. Vitamin D deficiency -Continue with current treatment  5. Benign prostatic hyperplasia, unspecified whether lower urinary tract symptoms present -Continue with follow-up at [facility] and continue with Flomax.  6. Morbid obesity ([place]) -Continue with as aggressive weight loss measures as possible including diet and exercise  7. Gastroesophageal reflux disease without esophagitis -He only takes ranitidine as needed and he understands this should be taken before meals twice daily.  8. Nocturia -Follow-up with urology as planned  No orders of the defined types were placed in this encounter.  Patient Instructions                       Medicare Annual Wellness Visit  [facility] and the medical providers at [facility] strive to bring you the best medical care.  In doing so we not only want to address your current medical conditions and concerns but also to detect new conditions early and prevent illness, disease and health-related problems.    Medicare offers a yearly Wellness Visit which allows our clinical staff to assess your need for  preventative services including immunizations, lifestyle education, counseling to decrease risk of preventable diseases and screening for fall risk and other medical concerns.    This visit is provided free of charge (no copay) for all Medicare recipients. The clinical pharmacists at [facility] have begun to conduct these Wellness Visits which will  also include a thorough review of all your medications.    As you primary medical provider recommend that you make an appointment for your Annual Wellness Visit if you have not done so already this year.  You may set up this appointment before you leave today or you may call back [phone]) and schedule an appointment.  Please make sure when you call that you mention that you are scheduling your Annual Wellness Visit with the clinical pharmacist so that the appointment may be made for the proper length of time.     Continue current medications. Continue good therapeutic lifestyle changes which include good diet and exercise. Fall precautions discussed with patient. If an FOBT was given today- please return it to our front desk. If you are over 68 years old - you may need Prevnar 72 or the adult Pneumonia vaccine.  **Flu shots are available--- please call and schedule a FLU-CLINIC appointment**  After your visit with [place] today you will receive a survey in the mail or online from [employer] regarding your care with [place]. Please take a moment to fill this out. Your feedback is very important to [place] as you can help [place] better understand your patient needs as well as improve your experience and satisfaction. WE CARE ABOUT YOU!!!   Watch diet closely and get as much exercise as possible and drink more water and avoid all sugar and carbs Check blood sugars regularly Make all efforts to lose weight Follow-up at the [facility] for periodic lab work and visits to the orthopedic doctor there. Drink plenty of fluids and stay well-hydrated.    Arrie Senate MD

## 2017-05-29 NOTE — Patient Instructions (Addendum)
Medicare Annual Wellness Visit  [facility] and the medical providers at [facility] strive to bring you the best medical care.  In doing so we not only want to address your current medical conditions and concerns but also to detect new conditions early and prevent illness, disease and health-related problems.    Medicare offers a yearly Wellness Visit which allows our clinical staff to assess your need for preventative services including immunizations, lifestyle education, counseling to decrease risk of preventable diseases and screening for fall risk and other medical concerns.    This visit is provided free of charge (no copay) for all Medicare recipients. The clinical pharmacists at [facility] have begun to conduct these Wellness Visits which will also include a thorough review of all your medications.    As you primary medical provider recommend that you make an appointment for your Annual Wellness Visit if you have not done so already this year.  You may set up this appointment before you leave today or you may call back [phone]) and schedule an appointment.  Please make sure when you call that you mention that you are scheduling your Annual Wellness Visit with the clinical pharmacist so that the appointment may be made for the proper length of time.     Continue current medications. Continue good therapeutic lifestyle changes which include good diet and exercise. Fall precautions discussed with patient. If an FOBT was given today- please return it to our front desk. If you are over 39 years old - you may need Prevnar 48 or the adult Pneumonia vaccine.  **Flu shots are available--- please call and schedule a FLU-CLINIC appointment**  After your visit with [place] today you will receive a survey in the mail or online from [employer] regarding your care with [place]. Please take a moment to fill this out. Your feedback is very  important to [place] as you can help [place] better understand your patient needs as well as improve your experience and satisfaction. WE CARE ABOUT YOU!!!   Watch diet closely and get as much exercise as possible and drink more water and avoid all sugar and carbs Check blood sugars regularly Make all efforts to lose weight Follow-up at the [facility] for periodic lab work and visits to the orthopedic doctor there. Drink plenty of fluids and stay well-hydrated.

## 2017-05-30 ENCOUNTER — Ambulatory Visit (INDEPENDENT_AMBULATORY_CARE_PROVIDER_SITE_OTHER): Payer: Medicare Other

## 2017-05-30 VITALS — BP 109/65 | HR 57 | Ht 73.0 in | Wt 269.0 lb

## 2017-05-30 DIAGNOSIS — Z0000 Encounter for general adult medical examination without abnormal findings: Secondary | ICD-10-CM | POA: Diagnosis not present

## 2017-05-30 NOTE — Progress Notes (Signed)
Subjective:   Cristian Moore is a 76 y.o. male who presents for Medicare Annual/Subsequent preventive examination. Patient is a retired. 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 He lives at home with his wife. He has 4 children with his first wife and one step child with current wife. He enjoys hunting, fishing, builds gun stocks, and also likes to watch TV. He exercises 3 times weekly and eats a semi healthy diet. 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 He attends church on a regular basis and participates in group activities. He currently does not have any pets at home. He is seen at the [place] on a regular basis. [facility]ian [facility] feels his health is the same as last year and has had no hospitalizations or surgeries in the past year.     Cardiac Risk Factors include: diabetes mellitus;advanced age (>65men, >39 women);dyslipidemia;male gender     Objective:    Vitals: BP 109/65   Pulse (!) 57   Ht 6\' 1"  (1.854 m)   Wt 269 lb (122 kg)   BMI 35.49 kg/m   Body mass index is 35.49 kg/m.  Advanced Directives 05/30/2017 03/18/2015 10/13/2014 02/22/2013 02/21/2013 07/23/2012 07/23/2012  Does Patient Have a Medical Advance Directive? No No No Patient does not have advance directive;Patient would not like information Patient does not have advance directive;Patient would not like information Patient does not have advance directive;Patient would like information -  Would patient like information on creating a medical advance directive? - - - - - Advance directive packet given -  Pre-existing out of facility DNR order (yellow form or pink MOST form) - - - No - No No    Tobacco Social History   Tobacco Use  Smoking Status Former Smoker  . Types: Cigars  . Last attempt to quit: 08/12/1999  . Years since quitting: 17.8  Smokeless Tobacco Never Used  Tobacco Comment   smoked cigars on and off for approx 4-5  yrs     Counseled on continued smoking cessation. Patient states he hasn't smoked a cigar in a long time.   Clinical Intake:  Past Medical History:  Diagnosis Date  . Arthritis   . Asthma   . Benign prostatic hypertrophy   . Carotid stenosis    Carotid US (07/2013):  Bilateral 1-39% ICA (f/u 2 yrs)  . Chest pain    a. 01/2013 Cath: LM nl, LAD 25p, D1 25p, LCX nl, RCA 25p, PDA nl, RPL nl, EF 55%;  b. 03/2015 MV: EF 59%, no ischemia; c. 03/2016 MV: basal inf/mid inf defect, likely artifact, EF 48%, low risk.  . Chronic Dyspnea on exertion    a. See cardiac eval under CAD heading;  b. 03/2015 Echo: EF 50-55%, gr1 DD, triv AI;  b. 03/2015 CTA Chest: No PE;  c. 05/2016 CPX: no significant cardi-Moore limitation noted. Suspect obesity contributing to exertional symptoms.  . Diabetes mellitus without complication ([facility]) 6160  . Difficulty urinating   . Diverticulitis   . Diverticulosis   . Exogenous obesity   . GERD (gastroesophageal reflux disease)   . H/O hiatal hernia   . Hernia    Umbulical   . Hyperlipidemia   . Laceration  01/11/2009    Deep laceration left index finger dorsally  . Musculoskeletal pain    in the right shoulder - S/P ROTATOR CUFF REPAIR-BUT STILL HAS PAIN  .  Obstructive sleep apnea     CPAP    AUTO SET 6 TO 20 - USUALLY SETTLES OUT AT 10  . Rectal bleeding    PT ATTRIBUTES TO HEMORRHOIDS  . SUPRAVENTRICULAR TACHYCARDIA 08/01/2009   Qualifier: Diagnosis of  By: Cristian Cliche, CNA, [facility] [facility]    . Thyroiditis   . Ulcer 2008   Past Surgical History:  Procedure Laterality Date  . COLONOSCOPY     for polyps  . ELBOW BURSA SURGERY    . Eyelid Surgery Bilateral 05/2013  . INGUINAL HERNIA REPAIR    . KIDNEY STONE SURGERY    . KNEE SURGERY    . LAPAROSCOPIC SIGMOID COLECTOMY  07/23/2012   Procedure: LAPAROSCOPIC SIGMOID COLECTOMY;  Surgeon: Cristian Hector, MD;  Location: [facility];  Service: General;  Laterality: N/A;  Laparoscopic Sigmoid Colectomy  . LEFT HEART CATHETERIZATION  WITH CORONARY ANGIOGRAM N/A 02/22/2013   Procedure: LEFT HEART CATHETERIZATION WITH CORONARY ANGIOGRAM;  Surgeon: Cristian Breeding, MD;  Location: [facility];  Service: Cardiovascular;  Laterality: N/A;  . PROCTOSCOPY  07/23/2012   Procedure: PROCTOSCOPY;  Surgeon: Cristian Hector, MD;  Location: [facility];  Service: General;  Laterality: N/A;  Rigid Proctoscopy  . ROTATOR CUFF REPAIR    . TONSILLECTOMY    . UMBILICAL HERNIA REPAIR  07/23/2012   Procedure: HERNIA REPAIR UMBILICAL ADULT;  Surgeon: Cristian Hector, MD;  Location: [facility];  Service: General;  Laterality: N/A;  Primary Umbilical Hernia Repair   Family History  Problem Relation Age of Onset  . Stroke Mother   . Heart disease Mother        Valvular heart disease, [facility]  . Lung cancer Maternal Uncle    Social History   Socioeconomic History  . Marital status: Married    Spouse name: Cristian [facility]  . Number of children: 4  . Years of education: None  . Highest education level: Associate degree: academic program  Social Needs  . Financial resource strain: Not hard at all  . Food insecurity - worry: Never true  . Food insecurity - inability: Never true  . Transportation needs - medical: No  . Transportation needs - non-medical: No  Occupational History  . Occupation: Retired  Tobacco Use  . Smoking status: Former Smoker    Types: Cigars    Last attempt to quit: 08/12/1999    Years since quitting: 17.8  . Smokeless tobacco: Never Used  . Tobacco comment: smoked cigars on and off for approx 4-5 yrs  Substance and Sexual Activity  . Alcohol use: No  . Drug use: No  . Sexual activity: Yes  Other Topics Concern  . None  Social History Narrative   Lives at home with wife      Cristian Moore (09/16/16):   Originally [place]. 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 He also worked in [place] [facility]. No pets  currently. Remote bird exposure. No mold or hot tub exposure. Enjoys playing banjo as well as hunting. He also raises cattle. Has carpet in his bedroom. No feather bedding. No indoor plants.     Outpatient Encounter Medications as of 05/30/2017  Medication Sig  . ALPRAZolam (XANAX) 0.25 MG tablet Take 1/2 tablet by mouth daily as needed  . aspirin 81 MG tablet Take 81 mg by mouth daily.  . cholecalciferol (VITAMIN D) 1000 UNITS tablet  Take 2,000 Units by mouth daily.   . Coenzyme Q10 (CO Q-10) 100 MG CAPS Take 100 mg by mouth daily.  Marland Kitchen escitalopram (LEXAPRO) 10 MG tablet TAKE 1 TABLET BY MOUTH ONCE DAILY AS DIRECTED  . finasteride (PROSCAR) 5 MG tablet Take 1 tablet by mouth daily.  . fluticasone (FLONASE) 50 MCG/ACT nasal spray USE 2 SPRAYS IN EACH  NOSTRIL DAILY  . metFORMIN (GLUCOPHAGE-XR) 500 MG 24 hr tablet TAKE 1 TABLET BY MOUTH  DAILY WITH BREAKFAST  . metoprolol tartrate (LOPRESSOR) 50 MG tablet TAKE ONE-HALF TABLET BY  MOUTH TWICE A DAY  . montelukast (SINGULAIR) 10 MG tablet Take 1 tablet (10 mg total) by mouth at bedtime.  . Multiple Vitamins-Minerals (CENTRUM SILVER PO) Take 1 tablet by mouth daily.  . nitroGLYCERIN (NITROSTAT) 0.4 MG SL tablet DISSOLVE ONE TABLET UNDER THE TONGUE EVERY 5 MINUTES AS NEEDED FOR CHEST PAIN.  DO NOT EXCEED A TOTAL OF 3 DOSES IN 15 MINUTES  . ONETOUCH DELICA LANCETS 56D MISC Check BS BID and PRN. DX. E11.8  . ONETOUCH VERIO test strip USE TO CHECK BLOOD SUGAR TWICE DAILY AND AS NEEDED  . PRESCRIPTION MEDICATION Inhale 1 application into the lungs at bedtime. "CPAP" MACHINE  . ranitidine (ZANTAC) 150 MG capsule Take 150 mg by mouth 2 (two) times daily.  . simvastatin (ZOCOR) 20 MG tablet TAKE 1 TABLET BY MOUTH AT  BEDTIME  . sodium chloride (OCEAN) 0.65 % nasal spray Place 1 spray into the nose as needed for congestion.  Marland Kitchen Spacer/Aero-Holding Chambers (AEROCHAMBER MV) inhaler Use as instructed  . sucralfate (CARAFATE) 1 g tablet Take 1 tablet by mouth   twice a day  . tamsulosin (FLOMAX) 0.4 MG CAPS capsule Take 0.8 mg by mouth daily after supper.  . zolpidem (AMBIEN) 10 MG tablet Take 1 tablet (10 mg total) by mouth at bedtime.   No facility-administered encounter medications on file as of 05/30/2017.     Activities of Daily Living In your present state of health, do you have any difficulty performing the following activities: 05/30/2017  Hearing? Y  Comment Wears hearing at times. [facility]  Vision? Y  Comment Wears glasses  Difficulty concentrating or making decisions? N  Walking or climbing stairs? N  Dressing or bathing? N  Doing errands, shopping? N  Preparing Food and eating ? N  Using the Toilet? N  In the past six months, have you accidently leaked urine? N  Do you have problems with loss of bowel control? N  Managing your Medications? N  Managing your Finances? N  Housekeeping or managing your Housekeeping? N  Some recent data might be hidden   Patient has no problems with ADLs. He wears glasses and has hearing aids that he received from the [place]. They performed a hearing screen and advised that he had hearing loss. They provided the hearing aids but patient states that he doesn't wear them very often. Patient had no problem hearing during the visit today without the hearing aids.    Patient Care Team: Chipper Herb, MD as PCP - General (Family Medicine) Gatha Mayer, MD as Consulting Physician (Gastroenterology) Cristian Breeding, MD as Consulting Physician (Cardiology) Anda Kraft, MD as Consulting Physician (Endocrinology) Tanda Rockers, MD as Consulting Physician (Moore Disease) Netta Cedars, MD as Consulting Physician (Orthopedic Surgery) Suella Broad, MD as Consulting Physician (Physical Medicine and Rehabilitation)   Assessment:    Exercise Activities and Dietary recommendations Current Exercise Habits: Home exercise  routine, Type of exercise: walking, Time (Minutes): 30,  Frequency (Times/Week): 3, Weekly Exercise (Minutes/Week): 90, Intensity: Mild, Exercise limited by: cardiac condition(s)  Goals    . DIET - EAT MORE FRUITS AND VEGETABLES    . Exercise 3x per week (30 min per time)      Fall Risk Fall Risk  05/30/2017 05/29/2017 01/22/2017 01/15/2017 09/20/2016  Falls in the past year? No Yes No No No  Number falls in past yr: 2 or more 2 or more - - -  Injury with Fall? (No Data) Yes - - -  Comment Falls due to knee pain. Has had shots done recently at [place] - - - -   Is the patient's home free of loose throw rugs in walkways, pet beds, electrical cords, etc?   no      Grab bars in the bathroom? no      Handrails on the stairs?   yes      Adequate lighting?   yes Depression Screen PHQ 2/9 Scores 05/30/2017 05/29/2017 01/22/2017 01/15/2017  PHQ - 2 Score 0 0 0 1  PHQ- 9 Score - - - -    Cognitive Function MMSE - Mini Mental State Exam 05/30/2017 10/13/2014  Orientation to time 5 5  Orientation to Place 5 5  Registration 3 3  Attention/ Calculation 5 5  Recall 3 3  Language- name 2 objects 2 2  Language- repeat 1 1  Language- follow 3 step command 3 3  Language- read & follow direction 1 1  Write a sentence 1 1  Copy design 1 1  Total score 30 30      Patient completed MMSE with no problems   Immunization History  Administered Date(s) Administered  . Influenza Split 03/31/2012  . Influenza, High Dose Seasonal PF 04/11/2017  . Influenza,inj,Quad PF,6+ Mos 03/19/2013, 03/27/2015  . Influenza-Unspecified 02/28/2014, 03/01/2016  . Pneumococcal Conjugate-13 07/08/2013  . Pneumococcal Polysaccharide-23 03/31/2005  . Tdap 04/01/2011   Screening Tests Health Maintenance  Topic Date Due  . HEMOGLOBIN A1C  07/25/2017  . OPHTHALMOLOGY EXAM  08/01/2017  . URINE MICROALBUMIN  09/20/2017  . FOOT EXAM  05/29/2018  . TETANUS/TDAP  03/31/2021  . INFLUENZA VACCINE  Completed  . PNA vac Low Risk Adult  Completed     Additional  Screenings: Hepatitis B/HIV/Syphillis: Hepatitis C Screening:     Plan:   Patient advised to follow up with provider on a regular basis. Discussed continued diet and exercise with patient. Patient is up to date on vaccines and has had no surgeries or hospitalizations in the past year.Encouraged patient to continue with hobbies.    I have personally reviewed and noted the following in the patient's chart:   . Medical and social history . Use of alcohol, tobacco or illicit drugs  . Current medications and supplements . Functional ability and status . Nutritional status . Physical activity . Advanced directives . List of other physicians . Hospitalizations, surgeries, and ER visits in previous 12 months . Vitals . Screenings to include cognitive, depression, and falls . Referrals and appointments  In addition, I have reviewed and discussed with patient certain preventive protocols, quality metrics, and best practice recommendations. A written personalized care plan for preventive services as well as general preventive health recommendations were provided to patient.     Rolena Infante, LPN  [id number]  I have reviewed and agree with the above AWV documentation.   Arrie Senate MD

## 2017-05-30 NOTE — Patient Instructions (Signed)
  [facility] , Thank you for taking time to come for your Medicare Wellness Visit. I appreciate your ongoing commitment to your health goals. Please review the following plan we discussed and let me know if I can assist you in the future.   These are the goals we discussed: Goals    . DIET - EAT MORE FRUITS AND VEGETABLES    . Exercise 3x per week (30 min per time)       This is a list of the screening recommended for you and due dates:  Health Maintenance  Topic Date Due  . Hemoglobin A1C  07/25/2017  . Eye exam for diabetics  08/01/2017  . Urine Protein Check  09/20/2017  . Complete foot exam   05/29/2018  . Tetanus Vaccine  03/31/2021  . Flu Shot  Completed  . Pneumonia vaccines  Completed

## 2017-06-25 ENCOUNTER — Other Ambulatory Visit: Payer: Self-pay | Admitting: Family Medicine

## 2017-06-25 NOTE — Telephone Encounter (Signed)
Seen 05/29/17  DWM  If approved route to nurse to call into [facility]

## 2017-07-21 ENCOUNTER — Ambulatory Visit (INDEPENDENT_AMBULATORY_CARE_PROVIDER_SITE_OTHER): Payer: Medicare HMO | Admitting: Physician Assistant

## 2017-07-21 ENCOUNTER — Encounter: Payer: Self-pay | Admitting: Physician Assistant

## 2017-07-21 VITALS — BP 129/72 | HR 72 | Temp 98.1°F | Ht 73.0 in | Wt 273.0 lb

## 2017-07-21 DIAGNOSIS — J209 Acute bronchitis, unspecified: Secondary | ICD-10-CM

## 2017-07-21 MED ORDER — METHYLPREDNISOLONE ACETATE 80 MG/ML IJ SUSP
80.0000 mg | Freq: Once | INTRAMUSCULAR | Status: AC
  Administered 2017-07-21: 80 mg via INTRAMUSCULAR

## 2017-07-21 MED ORDER — DOXYCYCLINE HYCLATE 100 MG PO TABS: 100.0000 mg | ORAL_TABLET | Freq: Two times a day (BID) | ORAL | 0 refills | Status: DC

## 2017-07-21 NOTE — Patient Instructions (Addendum)
In a few days you may receive a survey in the mail or online from [employer] regarding your visit with us today. Please take a moment to fill this out. Your feedback is very important to our whole office. It can help us better understand your needs as well as improve your experience and satisfaction. Thank you for taking your time to complete it. We care about you.  Sabriel Borromeo, PA-C  

## 2017-07-22 NOTE — Progress Notes (Signed)
BP 129/72   Pulse 72   Temp 98.1 F (36.7 C) (Oral)   Ht 6\' 1"  (1.854 m)   Wt 273 lb (123.8 kg)   SpO2 96%   BMI 36.02 kg/m    Subjective:    Patient ID: Cristian Moore, male    DOB: 1940-07-03, 77 y.o.   MRN: [id number]  HPI: Cristian Moore is a 77 y.o. male presenting on 07/21/2017 for Cough; chest congestion; and Diarrhea  Patient with several days of progressing upper respiratory and bronchial symptoms. Initially there was more upper respiratory congestion. This progressed to having significant cough that is productive throughout the day and severe at night. There is occasional wheezing after coughing. Sometimes there is slight dyspnea on exertion. It is productive mucus that is yellow in color. Denies any blood.   Relevant past medical, surgical, family and social history reviewed and updated as indicated. Allergies and medications reviewed and updated.  Past Medical History:  Diagnosis Date  . Arthritis   . Asthma   . Benign prostatic hypertrophy   . Carotid stenosis    Carotid US (07/2013):  Bilateral 1-39% ICA (f/u 2 yrs)  . Chest pain    a. 01/2013 Cath: LM nl, LAD 25p, D1 25p, LCX nl, RCA 25p, PDA nl, RPL nl, EF 55%;  b. 03/2015 MV: EF 59%, no ischemia; c. 03/2016 MV: basal inf/mid inf defect, likely artifact, EF 48%, low risk.  . Chronic Dyspnea on exertion    a. See cardiac eval under CAD heading;  b. 03/2015 Echo: EF 50-55%, gr1 DD, triv AI;  b. 03/2015 CTA Chest: No PE;  c. 05/2016 CPX: no significant cardi-pulmonary limitation noted. Suspect obesity contributing to exertional symptoms.  . Diabetes mellitus without complication ([place]) 0623  . Difficulty urinating   . Diverticulitis   . Diverticulosis   . Exogenous obesity   . GERD (gastroesophageal reflux disease)   . H/O hiatal hernia   . Hernia    Umbulical   . Hyperlipidemia   . Laceration  01/11/2009    Deep laceration left index finger dorsally  . Musculoskeletal pain    in the right shoulder - S/P  ROTATOR CUFF REPAIR-BUT STILL HAS PAIN  . Obstructive sleep apnea     CPAP    AUTO SET 6 TO 20 - USUALLY SETTLES OUT AT 10  . Rectal bleeding    PT ATTRIBUTES TO HEMORRHOIDS  . SUPRAVENTRICULAR TACHYCARDIA 08/01/2009   Qualifier: Diagnosis of  By: Lovette Cliche, CNA, Christy    . Thyroiditis   . Ulcer 2008    Past Surgical History:  Procedure Laterality Date  . COLONOSCOPY     for polyps  . ELBOW BURSA SURGERY    . Eyelid Surgery Bilateral 05/2013  . INGUINAL HERNIA REPAIR    . KIDNEY STONE SURGERY    . KNEE SURGERY    . LAPAROSCOPIC SIGMOID COLECTOMY  07/23/2012   Procedure: LAPAROSCOPIC SIGMOID COLECTOMY;  Surgeon: [name], MD;  Location: [facility];  Service: General;  Laterality: N/A;  Laparoscopic Sigmoid Colectomy  . LEFT HEART CATHETERIZATION WITH CORONARY ANGIOGRAM N/A 02/22/2013   Procedure: LEFT HEART CATHETERIZATION WITH CORONARY ANGIOGRAM;  Surgeon: [name], MD;  Location: [facility] CATH LAB;  Service: Cardiovascular;  Laterality: N/A;  . PROCTOSCOPY  07/23/2012   Procedure: PROCTOSCOPY;  Surgeon: [name], MD;  Location: [facility];  Service: General;  Laterality: N/A;  Rigid Proctoscopy  . ROTATOR CUFF REPAIR    . TONSILLECTOMY    .  UMBILICAL HERNIA REPAIR  07/23/2012   Procedure: HERNIA REPAIR UMBILICAL ADULT;  Surgeon: [name], MD;  Location: [facility];  Service: General;  Laterality: N/A;  Primary Umbilical Hernia Repair    Review of Systems  Constitutional: Positive for chills and fatigue. Negative for appetite change.  HENT: Positive for congestion, sinus pressure and sore throat.   Eyes: Negative.  Negative for pain and visual disturbance.  Respiratory: Positive for cough, shortness of breath and wheezing. Negative for chest tightness.   Cardiovascular: Negative.  Negative for chest pain, palpitations and leg swelling.  Gastrointestinal: Negative.  Negative for abdominal pain, diarrhea, nausea and vomiting.  Endocrine: Negative.   Genitourinary: Negative.     Musculoskeletal: Positive for back pain and myalgias.  Skin: Negative.  Negative for color change and rash.  Neurological: Positive for headaches. Negative for weakness and numbness.  Psychiatric/Behavioral: Negative.     Allergies as of 07/21/2017      Reactions   Penicillins Rash   Over 60 years ago      Medication List        Accurate as of 07/21/17 11:59 PM. Always use your most recent med list.          AEROCHAMBER MV inhaler Use as instructed   albuterol 108 (90 Base) MCG/ACT inhaler Commonly known as:  PROVENTIL HFA;VENTOLIN HFA Inhale 2 puffs into the lungs every 6 (six) hours as needed for wheezing or shortness of breath.   ALPRAZolam 0.25 MG tablet Commonly known as:  XANAX Take 1/2 tablet by mouth daily as needed   aspirin 81 MG tablet Take 81 mg by mouth daily.   CENTRUM SILVER PO Take 1 tablet by mouth daily.   cholecalciferol 1000 units tablet Commonly known as:  VITAMIN D Take 2,000 Units by mouth daily.   Co Q-10 100 MG Caps Take 100 mg by mouth daily.   doxycycline 100 MG tablet Commonly known as:  VIBRA-TABS Take 1 tablet (100 mg total) by mouth 2 (two) times daily. 1 po bid   escitalopram 10 MG tablet Commonly known as:  LEXAPRO TAKE 1 TABLET BY MOUTH ONCE DAILY AS DIRECTED   finasteride 5 MG tablet Commonly known as:  PROSCAR Take 1 tablet by mouth daily.   fluticasone 50 MCG/ACT nasal spray Commonly known as:  FLONASE USE 2 SPRAYS IN EACH  NOSTRIL DAILY   metFORMIN 500 MG 24 hr tablet Commonly known as:  GLUCOPHAGE-XR TAKE 1 TABLET BY MOUTH  DAILY WITH BREAKFAST   metoprolol tartrate 50 MG tablet Commonly known as:  LOPRESSOR TAKE ONE-HALF TABLET BY  MOUTH TWICE A DAY   montelukast 10 MG tablet Commonly known as:  SINGULAIR Take 1 tablet (10 mg total) by mouth at bedtime.   nitroGLYCERIN 0.4 MG SL tablet Commonly known as:  NITROSTAT DISSOLVE ONE TABLET UNDER THE TONGUE EVERY 5 MINUTES AS NEEDED FOR CHEST PAIN.  DO NOT  EXCEED A TOTAL OF 3 DOSES IN 15 MINUTES   ONETOUCH DELICA LANCETS 07P Misc Check BS BID and PRN. DX. E11.8   ONETOUCH VERIO test strip Generic drug:  glucose blood USE TO CHECK BLOOD SUGAR TWICE DAILY AND AS NEEDED   PRESCRIPTION MEDICATION Inhale 1 application into the lungs at bedtime. "CPAP" MACHINE   ranitidine 150 MG capsule Commonly known as:  ZANTAC Take 150 mg by mouth 2 (two) times daily.   simvastatin 20 MG tablet Commonly known as:  ZOCOR TAKE 1 TABLET BY MOUTH AT  BEDTIME   sodium  chloride 0.65 % nasal spray Commonly known as:  OCEAN Place 1 spray into the nose as needed for congestion.   sucralfate 1 g tablet Commonly known as:  CARAFATE Take 1 tablet by mouth  twice a day   tamsulosin 0.4 MG Caps capsule Commonly known as:  FLOMAX Take 0.8 mg by mouth daily after supper.   zolpidem 10 MG tablet Commonly known as:  AMBIEN TAKE ONE TABLET AT BEDTIME          Objective:    BP 129/72   Pulse 72   Temp 98.1 F (36.7 C) (Oral)   Ht 6\' 1"  (1.854 m)   Wt 273 lb (123.8 kg)   SpO2 96%   BMI 36.02 kg/m   Allergies  Allergen Reactions  . Penicillins Rash    Over 60 years ago    Physical Exam  Constitutional: He appears well-developed and well-nourished.  HENT:  Head: Normocephalic and atraumatic.  Right Ear: Hearing and tympanic membrane normal.  Left Ear: Hearing and tympanic membrane normal.  Nose: Mucosal edema and sinus tenderness present. No nasal deformity. Right sinus exhibits frontal sinus tenderness. Left sinus exhibits frontal sinus tenderness.  Mouth/Throat: Posterior oropharyngeal erythema present.  Eyes: Conjunctivae and EOM are normal. Pupils are equal, round, and reactive to light. Right eye exhibits no discharge. Left eye exhibits no discharge.  Neck: Normal range of motion. Neck supple.  Cardiovascular: Normal rate, regular rhythm and normal heart sounds.  Pulmonary/Chest: Effort normal. No respiratory distress. He has no  decreased breath sounds. He has wheezes. He has no rhonchi. He has no rales.  Abdominal: Soft. Bowel sounds are normal.  Musculoskeletal: Normal range of motion.  Skin: Skin is warm and dry.  Nursing note and vitals reviewed.       Assessment & Plan:   1. Acute bronchitis, unspecified organism - albuterol (PROVENTIL HFA;VENTOLIN HFA) 108 (90 Base) MCG/ACT inhaler; Inhale 2 puffs into the lungs every 6 (six) hours as needed for wheezing or shortness of breath. - methylPREDNISolone acetate (DEPO-MEDROL) injection 80 mg - doxycycline (VIBRA-TABS) 100 MG tablet; Take 1 tablet (100 mg total) by mouth 2 (two) times daily. 1 po bid  Dispense: 20 tablet; Refill: 0    Current Outpatient Medications:  .  albuterol (PROVENTIL HFA;VENTOLIN HFA) 108 (90 Base) MCG/ACT inhaler, Inhale 2 puffs into the lungs every 6 (six) hours as needed for wheezing or shortness of breath., Disp: , Rfl:  .  ALPRAZolam (XANAX) 0.25 MG tablet, Take 1/2 tablet by mouth daily as needed, Disp: 45 tablet, Rfl: 1 .  aspirin 81 MG tablet, Take 81 mg by mouth daily., Disp: , Rfl:  .  cholecalciferol (VITAMIN D) 1000 UNITS tablet, Take 2,000 Units by mouth daily. , Disp: , Rfl:  .  Coenzyme Q10 (CO Q-10) 100 MG CAPS, Take 100 mg by mouth daily., Disp: , Rfl:  .  doxycycline (VIBRA-TABS) 100 MG tablet, Take 1 tablet (100 mg total) by mouth 2 (two) times daily. 1 po bid, Disp: 20 tablet, Rfl: 0 .  escitalopram (LEXAPRO) 10 MG tablet, TAKE 1 TABLET BY MOUTH ONCE DAILY AS DIRECTED, Disp: 90 tablet, Rfl: 0 .  finasteride (PROSCAR) 5 MG tablet, Take 1 tablet by mouth daily., Disp: , Rfl:  .  fluticasone (FLONASE) 50 MCG/ACT nasal spray, USE 2 SPRAYS IN EACH  NOSTRIL DAILY, Disp: 48 g, Rfl: 5 .  metFORMIN (GLUCOPHAGE-XR) 500 MG 24 hr tablet, TAKE 1 TABLET BY MOUTH  DAILY WITH BREAKFAST, Disp: 90 tablet,  Rfl: 0 .  metoprolol tartrate (LOPRESSOR) 50 MG tablet, TAKE ONE-HALF TABLET BY  MOUTH TWICE A DAY, Disp: 90 tablet, Rfl: 0 .   montelukast (SINGULAIR) 10 MG tablet, Take 1 tablet (10 mg total) by mouth at bedtime., Disp: 30 tablet, Rfl: 3 .  Multiple Vitamins-Minerals (CENTRUM SILVER PO), Take 1 tablet by mouth daily., Disp: , Rfl:  .  nitroGLYCERIN (NITROSTAT) 0.4 MG SL tablet, DISSOLVE ONE TABLET UNDER THE TONGUE EVERY 5 MINUTES AS NEEDED FOR CHEST PAIN.  DO NOT EXCEED A TOTAL OF 3 DOSES IN 15 MINUTES, Disp: 25 tablet, Rfl: 11 .  ONETOUCH DELICA LANCETS 32T MISC, Check BS BID and PRN. DX. E11.8, Disp: 100 each, Rfl: 11 .  ONETOUCH VERIO test strip, USE TO CHECK BLOOD SUGAR TWICE DAILY AND AS NEEDED, Disp: 100 each, Rfl: 11 .  PRESCRIPTION MEDICATION, Inhale 1 application into the lungs at bedtime. "CPAP" MACHINE, Disp: , Rfl:  .  ranitidine (ZANTAC) 150 MG capsule, Take 150 mg by mouth 2 (two) times daily., Disp: , Rfl:  .  simvastatin (ZOCOR) 20 MG tablet, TAKE 1 TABLET BY MOUTH AT  BEDTIME, Disp: 90 tablet, Rfl: 3 .  sodium chloride (OCEAN) 0.65 % nasal spray, Place 1 spray into the nose as needed for congestion., Disp: , Rfl:  .  Spacer/Aero-Holding Chambers (AEROCHAMBER MV) inhaler, Use as instructed, Disp: 1 each, Rfl: 0 .  sucralfate (CARAFATE) 1 g tablet, Take 1 tablet by mouth  twice a day, Disp: 180 tablet, Rfl: 1 .  tamsulosin (FLOMAX) 0.4 MG CAPS capsule, Take 0.8 mg by mouth daily after supper., Disp: , Rfl:  .  zolpidem (AMBIEN) 10 MG tablet, TAKE ONE TABLET AT BEDTIME, Disp: 90 tablet, Rfl: 1 Continue all other maintenance medications as listed above.  Follow up plan: Return if symptoms worsen or fail to improve.  Educational handout given for [place] PA-C [facility] [address]  [place], [place] [address] [phone]   07/22/2017, 11:43 AM

## 2017-07-25 ENCOUNTER — Telehealth: Payer: Self-pay | Admitting: Family Medicine

## 2017-07-25 NOTE — Telephone Encounter (Signed)
Attempted to contact patient - NA °

## 2017-09-09 ENCOUNTER — Other Ambulatory Visit: Payer: Self-pay | Admitting: Family Medicine

## 2017-09-09 DIAGNOSIS — I2510 Atherosclerotic heart disease of native coronary artery without angina pectoris: Secondary | ICD-10-CM

## 2017-09-09 DIAGNOSIS — I10 Essential (primary) hypertension: Secondary | ICD-10-CM

## 2017-09-09 MED ORDER — METOPROLOL TARTRATE 50 MG PO TABS: 25.0000 mg | ORAL_TABLET | Freq: Two times a day (BID) | ORAL | 0 refills | Status: DC

## 2017-09-09 MED ORDER — FLUTICASONE PROPIONATE 50 MCG/ACT NA SUSP: 2.0000 | Freq: Every day | NASAL | 0 refills | Status: DC

## 2017-09-09 MED ORDER — ESCITALOPRAM OXALATE 10 MG PO TABS: 10.0000 mg | ORAL_TABLET | Freq: Every day | ORAL | 0 refills | Status: DC

## 2017-09-09 MED ORDER — METFORMIN HCL ER 500 MG PO TB24: 500.0000 mg | ORAL_TABLET | Freq: Every day | ORAL | 0 refills | Status: DC

## 2017-09-09 MED ORDER — SIMVASTATIN 20 MG PO TABS: 20.0000 mg | ORAL_TABLET | Freq: Every day | ORAL | 0 refills | Status: DC

## 2017-09-09 NOTE — Telephone Encounter (Signed)
LMOVM that refills have been sent to [facility]

## 2017-09-23 DIAGNOSIS — M47816 Spondylosis without myelopathy or radiculopathy, lumbar region: Secondary | ICD-10-CM | POA: Insufficient documentation

## 2017-09-23 DIAGNOSIS — M5416 Radiculopathy, lumbar region: Secondary | ICD-10-CM | POA: Insufficient documentation

## 2017-09-30 DIAGNOSIS — M4696 Unspecified inflammatory spondylopathy, lumbar region: Secondary | ICD-10-CM | POA: Diagnosis not present

## 2017-09-30 DIAGNOSIS — M5136 Other intervertebral disc degeneration, lumbar region: Secondary | ICD-10-CM | POA: Insufficient documentation

## 2017-10-02 ENCOUNTER — Telehealth: Payer: Self-pay | Admitting: Family Medicine

## 2017-10-02 NOTE — Telephone Encounter (Signed)
What is the name of the medication? montelukast (SINGULAIR) 10 MG tablet  Have you contacted your pharmacy to request a refill? No not prescribed by [facility] would you like this sent to? [employer] mail order   Patient notified that their request is being sent to the clinical staff for review and that they should receive a call once it is complete. If they do not receive a call within 24 hours they can check with their pharmacy or our office.

## 2017-10-02 NOTE — Telephone Encounter (Signed)
It is okay to refill Singulair for this patient for 1 year

## 2017-10-03 MED ORDER — MONTELUKAST SODIUM 10 MG PO TABS: 10.0000 mg | ORAL_TABLET | Freq: Every day | ORAL | 3 refills | Status: DC

## 2017-10-03 NOTE — Telephone Encounter (Signed)
Med sent to mail order - [employer]

## 2017-10-16 ENCOUNTER — Ambulatory Visit: Payer: Medicare Other | Admitting: Family Medicine

## 2017-10-23 DIAGNOSIS — M5416 Radiculopathy, lumbar region: Secondary | ICD-10-CM | POA: Diagnosis not present

## 2017-10-23 DIAGNOSIS — M5136 Other intervertebral disc degeneration, lumbar region: Secondary | ICD-10-CM | POA: Diagnosis not present

## 2017-10-30 ENCOUNTER — Encounter: Payer: Self-pay | Admitting: Family Medicine

## 2017-10-30 ENCOUNTER — Ambulatory Visit (INDEPENDENT_AMBULATORY_CARE_PROVIDER_SITE_OTHER): Payer: Managed Care, Other (non HMO) | Admitting: Family Medicine

## 2017-10-30 VITALS — BP 117/65 | HR 63 | Temp 97.1°F | Ht 73.0 in | Wt 271.0 lb

## 2017-10-30 DIAGNOSIS — E782 Mixed hyperlipidemia: Secondary | ICD-10-CM | POA: Diagnosis not present

## 2017-10-30 DIAGNOSIS — E118 Type 2 diabetes mellitus with unspecified complications: Secondary | ICD-10-CM | POA: Diagnosis not present

## 2017-10-30 DIAGNOSIS — E559 Vitamin D deficiency, unspecified: Secondary | ICD-10-CM | POA: Diagnosis not present

## 2017-10-30 DIAGNOSIS — K219 Gastro-esophageal reflux disease without esophagitis: Secondary | ICD-10-CM | POA: Diagnosis not present

## 2017-10-30 DIAGNOSIS — I10 Essential (primary) hypertension: Secondary | ICD-10-CM | POA: Diagnosis not present

## 2017-10-30 DIAGNOSIS — I700 Atherosclerosis of aorta: Secondary | ICD-10-CM | POA: Diagnosis not present

## 2017-10-30 DIAGNOSIS — N400 Enlarged prostate without lower urinary tract symptoms: Secondary | ICD-10-CM | POA: Diagnosis not present

## 2017-10-30 DIAGNOSIS — J439 Emphysema, unspecified: Secondary | ICD-10-CM

## 2017-10-30 LAB — BAYER DCA HB A1C WAIVED: HB A1C: 5.8 % (ref <7.0)

## 2017-10-30 MED ORDER — ESCITALOPRAM OXALATE 10 MG PO TABS: 10.0000 mg | ORAL_TABLET | Freq: Every day | ORAL | 3 refills | Status: DC

## 2017-10-30 NOTE — Progress Notes (Signed)
Subjective:    Patient ID: Cristian Moore, male    DOB: [birth year], 77 y.o.   MRN: [id number]  HPI Pt here for follow up and management of chronic medical problems which includes diabetes, hypertension and hyperlipidemia. He is taking medication regularly.  Patient is doing well overall.  His wife is recently been in the hospital because the meningioma that she has had in the past is increasing in size again and he is concerned about that.  The patient recently had a CT scan in June 2018 of the chest and it showed stable small pulmonary nodules with no new pulmonary lesions with mild emphysematous changes but no mediastinal or hilar mass or adenopathy.  He did have aortic atherosclerosis and emphysema.  This past report will be reviewed with him and he will be given a copy of this for his records.  Patient has been getting some injections in his back from the orthopedist and unfortunately he says these injections are not helping that much and he will let the orthopedist know this.  He also got some injections in his knees from the [place] and his knees are better.  He denies any chest pain or shortness of breath anymore than usual.  He still has occasional heartburn and is only taking his ranitidine as needed and I encouraged him to take it more regularly twice a day and continue to watch his diet.  He is passing his water well other than frequency and discontinued his Flomax due to the sexual side effects.  He saw the [place] about this and says that he would just go more frequently to the bathroom.  He denies any blood in the stool or black tarry bowel movements or change in bowel habits.  His last colonoscopy was in 2012 and he was told to repeat one in 10 years.  His blood sugars at home are running in the 1 10-1 20 range fasting.     Patient Active Problem List   Diagnosis Date Noted  . Cough 09/16/2016  . Chest pain 03/20/2015  . Chest tightness 03/19/2015  . Numbness of left hand 03/19/2015  .  Insomnia 07/19/2014  . Vitamin D deficiency 07/19/2014  . BPH (benign prostatic hyperplasia) 07/19/2014  . DM (diabetes mellitus), type 2 with complications ([place]) 19/50/9326  . H/O blepharoplasty 07/27/2013  . Acid reflux 06/03/2013  . Personal history of other diseases of circulatory system 06/03/2013  . History of PSVT (paroxysmal supraventricular tachycardia) 06/03/2013  . GERD (gastroesophageal reflux disease) 06/03/2013  . Hiatal hernia 06/03/2013  . Peripheral visual field defect of both eyes 05/18/2013  . Excess skin of eyelid 05/18/2013  . Non-Obstructive CAD 03/05/2013  . Essential hypertension 02/22/2013  . Shortness of breath   . Obstructive sleep apnea   . Diverticulitis, recurrent, s/p lap sigmoid colectomy 07/23/2012 06/02/2012  . Carotid artery stenosis 09/28/2009  . Obesity (BMI 30-39.9) 08/02/2009  . HLD (hyperlipidemia) 08/01/2009   Outpatient Encounter Medications as of 10/30/2017  Medication Sig  . albuterol (PROVENTIL HFA;VENTOLIN HFA) 108 (90 Base) MCG/ACT inhaler Inhale 2 puffs into the lungs every 6 (six) hours as needed for wheezing or shortness of breath.  . ALPRAZolam (XANAX) 0.25 MG tablet Take 1/2 tablet by mouth daily as needed  . aspirin 81 MG tablet Take 81 mg by mouth daily.  . cholecalciferol (VITAMIN D) 1000 UNITS tablet Take 2,000 Units by mouth daily.   . Coenzyme Q10 (CO Q-10) 100 MG CAPS Take 100 mg by mouth daily.  Marland Kitchen  escitalopram (LEXAPRO) 10 MG tablet Take 1 tablet (10 mg total) by mouth daily. as directed  . finasteride (PROSCAR) 5 MG tablet Take 1 tablet by mouth daily.  . fluticasone (FLONASE) 50 MCG/ACT nasal spray Place 2 sprays into both nostrils daily.  . metFORMIN (GLUCOPHAGE-XR) 500 MG 24 hr tablet Take 1 tablet (500 mg total) by mouth daily with breakfast.  . metoprolol tartrate (LOPRESSOR) 50 MG tablet Take 0.5 tablets (25 mg total) by mouth 2 (two) times daily.  . montelukast (SINGULAIR) 10 MG tablet Take 1 tablet (10 mg total) by  mouth at bedtime.  . Multiple Vitamins-Minerals (CENTRUM SILVER PO) Take 1 tablet by mouth daily.  . nitroGLYCERIN (NITROSTAT) 0.4 MG SL tablet DISSOLVE ONE TABLET UNDER THE TONGUE EVERY 5 MINUTES AS NEEDED FOR CHEST PAIN.  DO NOT EXCEED A TOTAL OF 3 DOSES IN 15 MINUTES  . ONETOUCH DELICA LANCETS 21J MISC Check BS BID and PRN. DX. E11.8  . ONETOUCH VERIO test strip USE TO CHECK BLOOD SUGAR TWICE DAILY AND AS NEEDED  . PRESCRIPTION MEDICATION Inhale 1 application into the lungs at bedtime. "CPAP" MACHINE  . ranitidine (ZANTAC) 150 MG capsule Take 150 mg by mouth 2 (two) times daily.  . simvastatin (ZOCOR) 20 MG tablet Take 1 tablet (20 mg total) by mouth at bedtime.  . sodium chloride (OCEAN) 0.65 % nasal spray Place 1 spray into the nose as needed for congestion.  Marland Kitchen Spacer/Aero-Holding Chambers (AEROCHAMBER MV) inhaler Use as instructed  . sucralfate (CARAFATE) 1 g tablet Take 1 tablet by mouth  twice a day  . tamsulosin (FLOMAX) 0.4 MG CAPS capsule Take 0.8 mg by mouth daily after supper.  . zolpidem (AMBIEN) 10 MG tablet TAKE ONE TABLET AT BEDTIME  . [DISCONTINUED] doxycycline (VIBRA-TABS) 100 MG tablet Take 1 tablet (100 mg total) by mouth 2 (two) times daily. 1 po bid   No facility-administered encounter medications on file as of 10/30/2017.      Review of Systems  Constitutional: Negative.   HENT: Negative.   Eyes: Negative.   Respiratory: Negative.   Cardiovascular: Negative.   Gastrointestinal: Negative.   Endocrine: Negative.   Genitourinary: Negative.   Musculoskeletal: Negative.   Skin: Negative.   Allergic/Immunologic: Negative.   Neurological: Negative.   Hematological: Negative.   Psychiatric/Behavioral: Negative.        Objective:   Physical Exam  Constitutional: He is oriented to person, place, and time. He appears well-developed and well-nourished. He appears distressed.  Patient is calm and pleasant and somewhat stressed regarding his wife's brain tumor.  HENT:    Head: Normocephalic and atraumatic.  Right Ear: External ear normal.  Left Ear: External ear normal.  Nose: Nose normal.  Mouth/Throat: Oropharynx is clear and moist. No oropharyngeal exudate.  Nasal turbinate congestion right greater than left  Eyes: Pupils are equal, round, and reactive to light. Conjunctivae and EOM are normal. Right eye exhibits no discharge. Left eye exhibits no discharge. No scleral icterus.  Patient has had eye exam within the past year and is currently having no problems.  Neck: Normal range of motion. Neck supple. No thyromegaly present.  No thyromegaly adenopathy or bruits detected.  Cardiovascular: Normal rate, regular rhythm, normal heart sounds and intact distal pulses. Exam reveals no gallop and no friction rub.  No murmur heard. Heart is regular at 60/min with good pedal pulses and no murmur.  Pulmonary/Chest: Effort normal and breath sounds normal. No respiratory distress. He has no wheezes. He has no  rales. He exhibits no tenderness.  Lungs are clear anteriorly and posteriorly with no axillary adenopathy or chest wall masses.  Abdominal: Soft. Bowel sounds are normal. He exhibits no mass. There is tenderness. There is no rebound and no guarding.  Abdominal obesity with slight epigastric tenderness and no liver or spleen enlargement masses or bruits  Musculoskeletal: Normal range of motion. He exhibits no edema or tenderness.  Lymphadenopathy:    He has no cervical adenopathy.  Neurological: He is alert and oriented to person, place, and time. He has normal reflexes. No cranial nerve deficit.  Skin: Skin is warm and dry. No rash noted. No erythema. No pallor.  Psychiatric: He has a normal mood and affect. His behavior is normal. Judgment and thought content normal.  Nursing note and vitals reviewed.   BP 117/65 (BP Location: Left Arm)   Pulse 63   Temp (!) 97.1 F (36.2 C) (Oral)   Ht 6\' 1"  (1.854 m)   Wt 271 lb (122.9 kg)   BMI 35.75 kg/m         Assessment & Plan:  1. Type 2 diabetes mellitus with complication, without long-term current use of insulin (HCC) -Patient's blood sugars at home of been running between 110 and 120 fasting.  He will continue with current treatment and with as aggressive therapeutic lifestyle changes as possible which include diet and exercise.  He understands the importance of losing weight with his morbid obesity and back problems.  2. Essential hypertension -Blood pressure is good today and he will continue with current treatment  3. Vitamin D deficiency -Continue with vitamin D replacement pending results of lab work  4. Mixed hyperlipidemia -Continue with current statin treatment aggressive therapeutic lifestyle changes to include diet and weight loss  5. Benign prostatic hyperplasia, unspecified whether lower urinary tract symptoms present -The patient still has nocturia.  6. Gastroesophageal reflux disease without esophagitis -He is only taking his ranitidine as needed and I encouraged him to take this more regularly as he is having periodic bouts with heartburn.  7. Morbid obesity ([place]) -The patient has morbid obesity based up on 2 or more comorbid conditions along with a BMI greater than 35.  He was reminded of this during the visit and encouraged to work aggressively on weight loss.  We have been working on this for years.  He understands this will also help his back problems.  8. Aortic atherosclerosis ([place]) -Finding was noted on a CT scan from this past summer because of follow-up of pulmonary nodules  9. Pulmonary emphysema, unspecified emphysema type ([place]) -Is finding came from the CT scan from summer 2018.  No orders of the defined types were placed in this encounter.  Patient Instructions                       Medicare Annual Wellness Visit  [facility] and the medical providers at [facility] strive to bring you the best medical care.  In doing so we not only  want to address your current medical conditions and concerns but also to detect new conditions early and prevent illness, disease and health-related problems.    Medicare offers a yearly Wellness Visit which allows our clinical staff to assess your need for preventative services including immunizations, lifestyle education, counseling to decrease risk of preventable diseases and screening for fall risk and other medical concerns.    This visit is provided free of charge (no copay) for all Medicare  recipients. The clinical pharmacists at [facility] have begun to conduct these Wellness Visits which will also include a thorough review of all your medications.    As you primary medical provider recommend that you make an appointment for your Annual Wellness Visit if you have not done so already this year.  You may set up this appointment before you leave today or you may call back ([phone]) and schedule an appointment.  Please make sure when you call that you mention that you are scheduling your Annual Wellness Visit with the clinical pharmacist so that the appointment may be made for the proper length of time.     Continue current medications. Continue good therapeutic lifestyle changes which include good diet and exercise. Fall precautions discussed with patient. If an FOBT was given today- please return it to our front desk. If you are over 13 years old - you may need Prevnar 61 or the adult Pneumonia vaccine.  **Flu shots are available--- please call and schedule a FLU-CLINIC appointment**  After your visit with [place] today you will receive a survey in the mail or online from [employer] regarding your care with [place]. Please take a moment to fill this out. Your feedback is very important to [place] as you can help [place] better understand your patient needs as well as improve your experience and satisfaction. WE CARE ABOUT YOU!!!   Continue with aggressive therapeutic lifestyle  changes Continue to follow-up with orthopedist Walk and exercise regularly always wear good shoes  Arrie Senate MD

## 2017-10-30 NOTE — Addendum Note (Signed)
Addended by: Zannie Cove on: 10/30/2017 08:35 AM   Modules accepted: Orders

## 2017-10-30 NOTE — Patient Instructions (Addendum)
Medicare Annual Wellness Visit  [facility] and the medical providers at [facility] strive to bring you the best medical care.  In doing so we not only want to address your current medical conditions and concerns but also to detect new conditions early and prevent illness, disease and health-related problems.    Medicare offers a yearly Wellness Visit which allows our clinical staff to assess your need for preventative services including immunizations, lifestyle education, counseling to decrease risk of preventable diseases and screening for fall risk and other medical concerns.    This visit is provided free of charge (no copay) for all Medicare recipients. The clinical pharmacists at [facility] have begun to conduct these Wellness Visits which will also include a thorough review of all your medications.    As you primary medical provider recommend that you make an appointment for your Annual Wellness Visit if you have not done so already this year.  You may set up this appointment before you leave today or you may call back ([phone]) and schedule an appointment.  Please make sure when you call that you mention that you are scheduling your Annual Wellness Visit with the clinical pharmacist so that the appointment may be made for the proper length of time.     Continue current medications. Continue good therapeutic lifestyle changes which include good diet and exercise. Fall precautions discussed with patient. If an FOBT was given today- please return it to our front desk. If you are over 76 years old - you may need Prevnar 63 or the adult Pneumonia vaccine.  **Flu shots are available--- please call and schedule a FLU-CLINIC appointment**  After your visit with [place] today you will receive a survey in the mail or online from [employer] regarding your care with [place]. Please take a moment to fill this out. Your feedback is very  important to [place] as you can help [place] better understand your patient needs as well as improve your experience and satisfaction. WE CARE ABOUT YOU!!!   Continue with aggressive therapeutic lifestyle changes Continue to follow-up with orthopedist Walk and exercise regularly always wear good shoes

## 2017-10-31 LAB — CBC WITH DIFFERENTIAL/PLATELET
BASOS: 1 %
Basophils Absolute: 0.1 10*3/uL (ref 0.0–0.2)
EOS (ABSOLUTE): 0.2 10*3/uL (ref 0.0–0.4)
Eos: 2 %
HEMOGLOBIN: 14.8 g/dL (ref 13.0–17.7)
Hematocrit: 43.3 % (ref 37.5–51.0)
IMMATURE GRANS (ABS): 0.1 10*3/uL (ref 0.0–0.1)
Immature Granulocytes: 2 %
LYMPHS ABS: 2.2 10*3/uL (ref 0.7–3.1)
LYMPHS: 25 %
MCH: 33.2 pg — AB (ref 26.6–33.0)
MCHC: 34.2 g/dL (ref 31.5–35.7)
MCV: 97 fL (ref 79–97)
MONOCYTES: 11 %
Monocytes Absolute: 1 10*3/uL — ABNORMAL HIGH (ref 0.1–0.9)
NEUTROS ABS: 5.2 10*3/uL (ref 1.4–7.0)
Neutrophils: 59 %
Platelets: 284 10*3/uL (ref 150–379)
RBC: 4.46 x10E6/uL (ref 4.14–5.80)
RDW: 13.3 % (ref 12.3–15.4)
WBC: 8.7 10*3/uL (ref 3.4–10.8)

## 2017-10-31 LAB — HEPATIC FUNCTION PANEL
ALT: 21 IU/L (ref 0–44)
AST: 16 IU/L (ref 0–40)
Albumin: 4.3 g/dL (ref 3.5–4.8)
Alkaline Phosphatase: 42 IU/L (ref 39–117)
Bilirubin Total: 0.4 mg/dL (ref 0.0–1.2)
Bilirubin, Direct: 0.11 mg/dL (ref 0.00–0.40)
Total Protein: 6.7 g/dL (ref 6.0–8.5)

## 2017-10-31 LAB — BMP8+EGFR
BUN/Creatinine Ratio: 15 (ref 10–24)
BUN: 14 mg/dL (ref 8–27)
CALCIUM: 9.7 mg/dL (ref 8.6–10.2)
CO2: 22 mmol/L (ref 20–29)
Chloride: 100 mmol/L (ref 96–106)
Creatinine, Ser: 0.94 mg/dL (ref 0.76–1.27)
GFR, EST AFRICAN AMERICAN: 90 mL/min/{1.73_m2} (ref >59)
GFR, EST NON AFRICAN AMERICAN: 78 mL/min/{1.73_m2} (ref >59)
Glucose: 115 mg/dL — ABNORMAL HIGH (ref 65–99)
POTASSIUM: 4.7 mmol/L (ref 3.5–5.2)
Sodium: 138 mmol/L (ref 134–144)

## 2017-10-31 LAB — LIPID PANEL
Chol/HDL Ratio: 3.1 ratio (ref 0.0–5.0)
Cholesterol, Total: 156 mg/dL (ref 100–199)
HDL: 50 mg/dL (ref >39)
LDL CALC: 76 mg/dL (ref 0–99)
Triglycerides: 151 mg/dL — ABNORMAL HIGH (ref 0–149)
VLDL CHOLESTEROL CAL: 30 mg/dL (ref 5–40)

## 2017-10-31 LAB — VITAMIN D 25 HYDROXY (VIT D DEFICIENCY, FRACTURES): VIT D 25 HYDROXY: 42.9 ng/mL (ref 30.0–100.0)

## 2017-11-07 DIAGNOSIS — G8929 Other chronic pain: Secondary | ICD-10-CM | POA: Diagnosis not present

## 2017-11-07 DIAGNOSIS — K5900 Constipation, unspecified: Secondary | ICD-10-CM | POA: Diagnosis not present

## 2017-11-07 DIAGNOSIS — G4700 Insomnia, unspecified: Secondary | ICD-10-CM | POA: Diagnosis not present

## 2017-11-07 DIAGNOSIS — R69 Illness, unspecified: Secondary | ICD-10-CM | POA: Diagnosis not present

## 2017-11-07 DIAGNOSIS — J45909 Unspecified asthma, uncomplicated: Secondary | ICD-10-CM | POA: Diagnosis not present

## 2017-11-07 DIAGNOSIS — E119 Type 2 diabetes mellitus without complications: Secondary | ICD-10-CM | POA: Diagnosis not present

## 2017-11-07 DIAGNOSIS — I471 Supraventricular tachycardia: Secondary | ICD-10-CM | POA: Diagnosis not present

## 2017-11-07 DIAGNOSIS — K219 Gastro-esophageal reflux disease without esophagitis: Secondary | ICD-10-CM | POA: Diagnosis not present

## 2017-11-07 DIAGNOSIS — E785 Hyperlipidemia, unspecified: Secondary | ICD-10-CM | POA: Diagnosis not present

## 2017-11-07 DIAGNOSIS — I25119 Atherosclerotic heart disease of native coronary artery with unspecified angina pectoris: Secondary | ICD-10-CM | POA: Diagnosis not present

## 2017-12-19 DIAGNOSIS — M5416 Radiculopathy, lumbar region: Secondary | ICD-10-CM | POA: Diagnosis not present

## 2017-12-19 DIAGNOSIS — M25551 Pain in right hip: Secondary | ICD-10-CM | POA: Diagnosis not present

## 2017-12-19 DIAGNOSIS — M5136 Other intervertebral disc degeneration, lumbar region: Secondary | ICD-10-CM | POA: Diagnosis not present

## 2017-12-19 DIAGNOSIS — M4696 Unspecified inflammatory spondylopathy, lumbar region: Secondary | ICD-10-CM | POA: Diagnosis not present

## 2017-12-22 ENCOUNTER — Other Ambulatory Visit: Payer: Self-pay | Admitting: Family Medicine

## 2017-12-23 ENCOUNTER — Other Ambulatory Visit: Payer: Self-pay | Admitting: Family Medicine

## 2017-12-23 NOTE — Telephone Encounter (Signed)
PT is needing a refill on tamsulosin (FLOMAX) 0.4 MG CAPS capsule needs to go to [facility] order, this is where he will have it filled for now on

## 2017-12-24 MED ORDER — TAMSULOSIN HCL 0.4 MG PO CAPS: 0.8000 mg | ORAL_CAPSULE | Freq: Every day | ORAL | 3 refills | Status: DC

## 2017-12-26 DIAGNOSIS — L570 Actinic keratosis: Secondary | ICD-10-CM | POA: Diagnosis not present

## 2017-12-26 DIAGNOSIS — D1801 Hemangioma of skin and subcutaneous tissue: Secondary | ICD-10-CM | POA: Diagnosis not present

## 2017-12-26 DIAGNOSIS — L821 Other seborrheic keratosis: Secondary | ICD-10-CM | POA: Diagnosis not present

## 2017-12-26 DIAGNOSIS — L819 Disorder of pigmentation, unspecified: Secondary | ICD-10-CM | POA: Diagnosis not present

## 2017-12-26 DIAGNOSIS — L814 Other melanin hyperpigmentation: Secondary | ICD-10-CM | POA: Diagnosis not present

## 2017-12-26 DIAGNOSIS — D229 Melanocytic nevi, unspecified: Secondary | ICD-10-CM | POA: Diagnosis not present

## 2018-01-07 DIAGNOSIS — M25551 Pain in right hip: Secondary | ICD-10-CM | POA: Diagnosis not present

## 2018-01-10 ENCOUNTER — Other Ambulatory Visit: Payer: Self-pay | Admitting: Family Medicine

## 2018-02-02 ENCOUNTER — Other Ambulatory Visit: Payer: Self-pay | Admitting: Family Medicine

## 2018-02-02 DIAGNOSIS — I10 Essential (primary) hypertension: Secondary | ICD-10-CM

## 2018-02-02 DIAGNOSIS — I2510 Atherosclerotic heart disease of native coronary artery without angina pectoris: Secondary | ICD-10-CM

## 2018-02-25 ENCOUNTER — Encounter: Payer: Self-pay | Admitting: Family Medicine

## 2018-02-27 ENCOUNTER — Telehealth: Payer: Self-pay | Admitting: Family Medicine

## 2018-02-27 NOTE — Telephone Encounter (Signed)
Pt aware - appt  

## 2018-03-05 DIAGNOSIS — M25551 Pain in right hip: Secondary | ICD-10-CM | POA: Diagnosis not present

## 2018-03-05 DIAGNOSIS — M1611 Unilateral primary osteoarthritis, right hip: Secondary | ICD-10-CM | POA: Diagnosis not present

## 2018-03-09 ENCOUNTER — Encounter: Payer: Self-pay | Admitting: Family Medicine

## 2018-03-09 ENCOUNTER — Ambulatory Visit (INDEPENDENT_AMBULATORY_CARE_PROVIDER_SITE_OTHER): Payer: Medicare HMO | Admitting: Family Medicine

## 2018-03-09 VITALS — BP 101/63 | HR 62 | Temp 97.5°F | Ht 73.0 in | Wt 265.0 lb

## 2018-03-09 DIAGNOSIS — N400 Enlarged prostate without lower urinary tract symptoms: Secondary | ICD-10-CM

## 2018-03-09 DIAGNOSIS — I10 Essential (primary) hypertension: Secondary | ICD-10-CM

## 2018-03-09 DIAGNOSIS — Z0000 Encounter for general adult medical examination without abnormal findings: Secondary | ICD-10-CM

## 2018-03-09 DIAGNOSIS — K219 Gastro-esophageal reflux disease without esophagitis: Secondary | ICD-10-CM

## 2018-03-09 DIAGNOSIS — J439 Emphysema, unspecified: Secondary | ICD-10-CM

## 2018-03-09 DIAGNOSIS — E782 Mixed hyperlipidemia: Secondary | ICD-10-CM

## 2018-03-09 DIAGNOSIS — E118 Type 2 diabetes mellitus with unspecified complications: Secondary | ICD-10-CM

## 2018-03-09 DIAGNOSIS — E559 Vitamin D deficiency, unspecified: Secondary | ICD-10-CM | POA: Diagnosis not present

## 2018-03-09 DIAGNOSIS — I700 Atherosclerosis of aorta: Secondary | ICD-10-CM

## 2018-03-09 LAB — URINALYSIS, COMPLETE
Bilirubin, UA: NEGATIVE
Glucose, UA: NEGATIVE
Ketones, UA: NEGATIVE
LEUKOCYTES UA: NEGATIVE
Nitrite, UA: NEGATIVE
PH UA: 5.5 (ref 5.0–7.5)
PROTEIN UA: NEGATIVE
RBC UA: NEGATIVE
SPEC GRAV UA: 1.015 (ref 1.005–1.030)
Urobilinogen, Ur: 0.2 mg/dL (ref 0.2–1.0)

## 2018-03-09 LAB — MICROSCOPIC EXAMINATION
BACTERIA UA: NONE SEEN
Epithelial Cells (non renal): NONE SEEN /hpf (ref 0–10)
RBC, UA: NONE SEEN /hpf (ref 0–2)
RENAL EPITHEL UA: NONE SEEN /HPF
WBC, UA: NONE SEEN /hpf (ref 0–5)

## 2018-03-09 NOTE — Progress Notes (Signed)
Subjective:    Patient ID: Cristian Moore, male    DOB: 15-May-1941, 77 y.o.   MRN: [id number]  HPI Pt here for CPE and management of chronic medical problems which includes diabetes, hypertension and hyperlipidemia. He is taking medication regularly.  The patient brings lab work from the [place] for review.  This was reviewed and other than a slightly elevated white count his CBC was within normal limits.  The sed rate was 0.  The urinalysis was clear of infection and red blood cells.  The hemoglobin A1c was 6.0.  The dipstick urinalysis was within normal limits.  The microalbumin was good at 0.594.  The creatinine plasma was 1.260.  The BUN was 14.  All of the electrolytes including potassium were good and all liver function tests were within normal limits.  The LDL-C cholesterol was excellent at 58.  Also a recent CT scan of the chest scheduled by Dr. Creig Hines had stable small pulmonary nodule since 2013 which were considered benign.  There were no new pulmonary lesions.  There was mild emphysematous changes.  No mediastinal mass he does have aortic atherosclerosis and emphysema.  Patient is considering right hip replacement by the orthopedic surgeon.  He denies any chest pain pressure tightness or shortness of breath anymore than usual.  He denies any trouble with swallowing heartburn indigestion nausea vomiting diarrhea or black tarry stools.  Occasionally he will see some bright red blood on the toilet tissue but attributes this to hemorrhoids.  He is passing his water without problems.  He is concerned about brown-colored semen that he has noticed and this is only on one occasion.  We will give him an old stool or Hemoccult card so that if he has any more he can put some of the semen on that card and bring it back and we can check and see if there is blood in it also I did tell him that if the semen continues to be brown-colored he should let us know and we will send him to the urologist for further  evaluation.  Because of pending right hip replacement he will need a cardiac assessment by Dr. Percival Spanish at some point in the future also and he is aware of this.    Patient Active Problem List   Diagnosis Date Noted  . Pulmonary emphysema ([place]) 10/30/2017  . Aortic atherosclerosis ([place]) 10/30/2017  . Cough 09/16/2016  . Chest pain 03/20/2015  . Chest tightness 03/19/2015  . Numbness of left hand 03/19/2015  . Insomnia 07/19/2014  . Vitamin D deficiency 07/19/2014  . BPH (benign prostatic hyperplasia) 07/19/2014  . DM (diabetes mellitus), type 2 with complications ([place]) 09/47/0962  . H/O blepharoplasty 07/27/2013  . Acid reflux 06/03/2013  . Personal history of other diseases of circulatory system 06/03/2013  . History of PSVT (paroxysmal supraventricular tachycardia) 06/03/2013  . GERD (gastroesophageal reflux disease) 06/03/2013  . Hiatal hernia 06/03/2013  . Peripheral visual field defect of both eyes 05/18/2013  . Excess skin of eyelid 05/18/2013  . Non-Obstructive CAD 03/05/2013  . Essential hypertension 02/22/2013  . Shortness of breath   . Obstructive sleep apnea   . Diverticulitis, recurrent, s/p lap sigmoid colectomy 07/23/2012 06/02/2012  . Carotid artery stenosis 09/28/2009  . Obesity (BMI 30-39.9) 08/02/2009  . HLD (hyperlipidemia) 08/01/2009   Outpatient Encounter Medications as of 03/09/2018  Medication Sig  . albuterol (PROVENTIL HFA;VENTOLIN HFA) 108 (90 Base) MCG/ACT inhaler Inhale 2 puffs into the lungs every 6 (six) hours as  needed for wheezing or shortness of breath.  . ALPRAZolam (XANAX) 0.25 MG tablet Take 1/2 tablet by mouth daily as needed  . aspirin 81 MG tablet Take 81 mg by mouth daily.  . cholecalciferol (VITAMIN D) 1000 UNITS tablet Take 2,000 Units by mouth daily.   . Coenzyme Q10 (CO Q-10) 100 MG CAPS Take 100 mg by mouth daily.  Marland Kitchen escitalopram (LEXAPRO) 10 MG tablet Take 1 tablet (10 mg total) by mouth daily. as directed  . fluticasone (FLONASE) 50  MCG/ACT nasal spray USE 2 SPRAYS IN EACH       NOSTRIL DAILY  . metFORMIN (GLUCOPHAGE-XR) 500 MG 24 hr tablet TAKE 1 TABLET DAILY WITH   BREAKFAST  . metoprolol tartrate (LOPRESSOR) 50 MG tablet TAKE 1/2 TABLET TWICE A DAY  . montelukast (SINGULAIR) 10 MG tablet Take 1 tablet (10 mg total) by mouth at bedtime.  . Multiple Vitamins-Minerals (CENTRUM SILVER PO) Take 1 tablet by mouth daily.  . nitroGLYCERIN (NITROSTAT) 0.4 MG SL tablet DISSOLVE ONE TABLET UNDER THE TONGUE EVERY 5 MINUTES AS NEEDED FOR CHEST PAIN.  DO NOT EXCEED A TOTAL OF 3 DOSES IN 15 MINUTES  . ONETOUCH DELICA LANCETS 64Q MISC Check BS BID and PRN. DX. E11.8  . ONETOUCH VERIO test strip USE TO CHECK BLOOD SUGAR TWICE DAILY AND AS NEEDED  . PRESCRIPTION MEDICATION Inhale 1 application into the lungs at bedtime. "CPAP" MACHINE  . ranitidine (ZANTAC) 150 MG capsule Take 150 mg by mouth 2 (two) times daily.  . simvastatin (ZOCOR) 20 MG tablet TAKE 1 TABLET AT BEDTIME  . sodium chloride (OCEAN) 0.65 % nasal spray Place 1 spray into the nose as needed for congestion.  Marland Kitchen Spacer/Aero-Holding Chambers (AEROCHAMBER MV) inhaler Use as instructed  . sucralfate (CARAFATE) 1 g tablet Take 1 tablet by mouth  twice a day  . tamsulosin (FLOMAX) 0.4 MG CAPS capsule Take 2 capsules (0.8 mg total) by mouth daily after supper.  . zolpidem (AMBIEN) 10 MG tablet TAKE ONE TABLET AT BEDTIME  . [DISCONTINUED] finasteride (PROSCAR) 5 MG tablet Take 1 tablet by mouth daily.   No facility-administered encounter medications on file as of 03/09/2018.       Review of Systems  Constitutional: Negative.   HENT: Negative.   Eyes: Negative.   Respiratory: Negative.   Cardiovascular: Negative.   Gastrointestinal: Negative.   Endocrine: Negative.   Genitourinary: Negative.   Musculoskeletal: Negative.  Arthralgias: right hip pain- Allusio // arthralgias - all over   Skin: Negative.   Allergic/Immunologic: Negative.   Neurological: Negative.     Hematological: Negative.   Psychiatric/Behavioral: Negative.        Objective:   Physical Exam  Constitutional: He is oriented to person, place, and time. He appears well-developed and well-nourished.  Patient is pleasant and alert and remains overweight.  HENT:  Head: Normocephalic and atraumatic.  Right Ear: External ear normal.  Left Ear: External ear normal.  Nose: Nose normal.  Mouth/Throat: Oropharynx is clear and moist. No oropharyngeal exudate.  Ears nose and throat all within normal limits and has had recent eye exam that was normal according to the patient only his prescription lenses were changed.  Eyes: Pupils are equal, round, and reactive to light. Conjunctivae and EOM are normal. Right eye exhibits no discharge. Left eye exhibits no discharge. No scleral icterus.  Recent eye exam.  Patient says this was normal.  Neck: Normal range of motion. Neck supple. No thyromegaly present.  No bruits thyromegaly or  anterior cervical adenopathy  Cardiovascular: Normal rate, regular rhythm, normal heart sounds and intact distal pulses.  No murmur heard. Heart was regular at 60/min without murmurs with good pedal pulses.  Pulmonary/Chest: Effort normal and breath sounds normal. No respiratory distress. He has no wheezes. He has no rales. He exhibits no tenderness.  Clear anteriorly and posteriorly and no axillary adenopathy.  No chest wall masses.  Abdominal: Soft. Bowel sounds are normal. He exhibits no mass. There is no tenderness. There is no rebound.  Abdominal obesity without liver or spleen enlargement masses bruits or inguinal adenopathy.  Genitourinary: Rectum normal and penis normal.  Genitourinary Comments: Prostate was slightly enlarged without any lumps or masses.  Rectal exam was negative for masses.  External genitalia were normal with no inguinal hernias being palpated and normal testicular exam.  Musculoskeletal: He exhibits no edema or tenderness.  Range of motion  slightly limited with ongoing right hip pain.  Lymphadenopathy:    He has no cervical adenopathy.  Neurological: He is alert and oriented to person, place, and time. He has normal reflexes. No cranial nerve deficit.  Skin: Skin is warm and dry. No rash noted.  Psychiatric: He has a normal mood and affect. His behavior is normal. Judgment and thought content normal.  Mood affect and behavior were all normal.  Nursing note and vitals reviewed.  BP 101/63 (BP Location: Left Arm)   Pulse 62   Temp (!) 97.5 F (36.4 C) (Oral)   Ht 6\' 1"  (1.854 m)   Wt 265 lb (120.2 kg)   BMI 34.96 kg/m         Assessment & Plan:  1. Annual physical exam -Patient may need cardiac assessment prior to hip surgery. - PSA, total and free - Urinalysis, Complete  2. Type 2 diabetes mellitus with complication, without long-term current use of insulin ([place]) -He will continue to monitor blood sugars at home and his hemoglobin A1c from an outside source was good and less than 7%. - Microalbumin / creatinine urine ratio  3. Essential hypertension -The blood pressure is good today and he will continue with current treatment  4. Mixed hyperlipidemia -All cholesterol numbers from the [place] were excellent and at goal.  5. Vitamin D deficiency -Continue with vitamin D replacement pending results of lab work - VITAMIN D 25 Hydroxy (Vit-D Deficiency, Fractures)  6. Benign prostatic hyperplasia, unspecified whether lower urinary tract symptoms present -The prostate remains enlarged but soft without lumps or masses.  The patient does complain of what sounds like hematospermia and we will try to check some of the semen for blood with an old Hemoccult card but at the same time the patient was told that if he notices any more brown-tinged semen that he should let us know we will schedule a visit for him with the urologist for further evaluation. - PSA, total and free  7. Gastroesophageal reflux disease without  esophagitis -Only occasional complaints of this and he will continue with current treatment  8. Morbid obesity ([place]) -Continue with as aggressive therapeutic lifestyle changes as possible to achieve weight loss through diet and exercise  9. Aortic atherosclerosis ([facility]) -Continue to follow-up with cardiology and continue with statin therapy  10. Pulmonary emphysema, unspecified emphysema type ([place]) -Patient is a former smoker and has no complaints with his breathing today.  Patient Instructions                       Medicare Annual Wellness  Visit  [facility] and the medical providers at [facility] strive to bring you the best medical care.  In doing so we not only want to address your current medical conditions and concerns but also to detect new conditions early and prevent illness, disease and health-related problems.    Medicare offers a yearly Wellness Visit which allows our clinical staff to assess your need for preventative services including immunizations, lifestyle education, counseling to decrease risk of preventable diseases and screening for fall risk and other medical concerns.    This visit is provided free of charge (no copay) for all Medicare recipients. The clinical pharmacists at [facility] have begun to conduct these Wellness Visits which will also include a thorough review of all your medications.    As you primary medical provider recommend that you make an appointment for your Annual Wellness Visit if you have not done so already this year.  You may set up this appointment before you leave today or you may call back ([phone]) and schedule an appointment.  Please make sure when you call that you mention that you are scheduling your Annual Wellness Visit with the clinical pharmacist so that the appointment may be made for the proper length of time.     Continue current medications. Continue good therapeutic lifestyle changes  which include good diet and exercise. Fall precautions discussed with patient. If an FOBT was given today- please return it to our front desk. If you are over 81 years old - you may need Prevnar 60 or the adult Pneumonia vaccine.  **Flu shots are available--- please call and schedule a FLU-CLINIC appointment**  After your visit with [place] today you will receive a survey in the mail or online from [employer] regarding your care with [place]. Please take a moment to fill this out. Your feedback is very important to [place] as you can help [place] better understand your patient needs as well as improve your experience and satisfaction. WE CARE ABOUT YOU!!!   The patient is encouraged to return and all FOBT card and to check any semen with this card to see if it is positive for blood. If he continues to have dark-colored semen he should let me know and we will refer him to the urologist for further evaluation of this. He should continue to drink plenty of fluids and stay well hydrated He should follow as aggressive therapeutic lifestyle changes as possible to achieve weight loss including diet and exercise He should continue to get his colonoscopies every 10 years and the next one is due and 2022. He should continue to check his blood sugars regularly We will call with the results of the PSA as soon as those results become available He should follow-up with orthopedic surgeon as planned for his right hip surgery and should check with his cardiologist before the surgery to make sure that a cardiology approval is not necessary.  Arrie Senate MD

## 2018-03-09 NOTE — Patient Instructions (Addendum)
Medicare Annual Wellness Visit  [facility] and the medical providers at [facility] strive to bring you the best medical care.  In doing so we not only want to address your current medical conditions and concerns but also to detect new conditions early and prevent illness, disease and health-related problems.    Medicare offers a yearly Wellness Visit which allows our clinical staff to assess your need for preventative services including immunizations, lifestyle education, counseling to decrease risk of preventable diseases and screening for fall risk and other medical concerns.    This visit is provided free of charge (no copay) for all Medicare recipients. The clinical pharmacists at [facility] have begun to conduct these Wellness Visits which will also include a thorough review of all your medications.    As you primary medical provider recommend that you make an appointment for your Annual Wellness Visit if you have not done so already this year.  You may set up this appointment before you leave today or you may call back ([phone]) and schedule an appointment.  Please make sure when you call that you mention that you are scheduling your Annual Wellness Visit with the clinical pharmacist so that the appointment may be made for the proper length of time.     Continue current medications. Continue good therapeutic lifestyle changes which include good diet and exercise. Fall precautions discussed with patient. If an FOBT was given today- please return it to our front desk. If you are over 78 years old - you may need Prevnar 22 or the adult Pneumonia vaccine.  **Flu shots are available--- please call and schedule a FLU-CLINIC appointment**  After your visit with [place] today you will receive a survey in the mail or online from [employer] regarding your care with [place]. Please take a moment to fill this out. Your feedback is very  important to [place] as you can help [place] better understand your patient needs as well as improve your experience and satisfaction. WE CARE ABOUT YOU!!!   The patient is encouraged to return and all FOBT card and to check any semen with this card to see if it is positive for blood. If he continues to have dark-colored semen he should let me know and we will refer him to the urologist for further evaluation of this. He should continue to drink plenty of fluids and stay well hydrated He should follow as aggressive therapeutic lifestyle changes as possible to achieve weight loss including diet and exercise He should continue to get his colonoscopies every 10 years and the next one is due and 77. He should continue to check his blood sugars regularly We will call with the results of the PSA as soon as those results become available He should follow-up with orthopedic surgeon as planned for his right hip surgery and should check with his cardiologist before the surgery to make sure that a cardiology approval is not necessary.

## 2018-03-10 ENCOUNTER — Telehealth: Payer: Self-pay

## 2018-03-10 LAB — MICROALBUMIN / CREATININE URINE RATIO
CREATININE, UR: 73.6 mg/dL
Microalb/Creat Ratio: <4.1 mg/g creat (ref 0.0–30.0)
Microalbumin, Urine: <3 ug/mL

## 2018-03-10 LAB — PSA, TOTAL AND FREE
PROSTATE SPECIFIC AG, SERUM: 1.2 ng/mL (ref 0.0–4.0)
PSA FREE: 0.37 ng/mL
PSA, Free Pct: 30.8 %

## 2018-03-10 LAB — VITAMIN D 25 HYDROXY (VIT D DEFICIENCY, FRACTURES): Vit D, 25-Hydroxy: 50.2 ng/mL (ref 30.0–100.0)

## 2018-03-10 NOTE — Telephone Encounter (Signed)
   [facility] Pre-operative Risk Assessment    Request for surgical clearance:  1. What type of surgery is being performed? Right Total Hop Anterior   2. When is this surgery scheduled? 05/27/18   3. What type of clearance is required (medical clearance vs. Pharmacy clearance to hold med vs. Both)? Both  4. Are there any medications that need to be held prior to surgery and how long? Aspirin   5. Practice name and name of physician performing surgery? [facility]- Dr. Wynelle Link   6. What is your office phone number [phone]   7.   What is your office fax number [phone]  8.   Anesthesia type (None, local, MAC, general) ? Choice   Ena Dawley 03/10/2018, 12:56 PM  _________________________________________________________________   (provider comments below)

## 2018-03-11 NOTE — Telephone Encounter (Signed)
   Primary Cardiologist:Dr Hochrein  Chart reviewed as part of pre-operative protocol coverage. Because of Cristian Moore past medical history and time since last visit, he/she will require a follow-up visit in order to better assess preoperative cardiovascular risk.  Pre-op covering staff: - Please schedule appointment and call patient to inform them. - Please contact requesting surgeon's office via preferred method (i.e, phone, fax) to inform them of need for appointment prior to surgery.  Kerin Ransom, PA-C  03/11/2018, 2:54 PM

## 2018-03-11 NOTE — Telephone Encounter (Signed)
Spoke with patient and appt was made with Dr Percival Spanish. He requested to have an appt in the [place] office and I told him I would schedule him in the [place] office and have a scheduler contact him with the [facility] on the [place] office calender if an appt is available. Patient voiced understanding.  Message sent to schedulers.

## 2018-03-17 NOTE — Progress Notes (Signed)
Cardiology Office Note   Date:  03/18/2018   ID:  Moore Cristian, DOB 29-Sep-1940, MRN [id number]  PCP:  Chipper Herb, MD  Cardiologist:   [name], MD  Referring:   Chipper Herb, MD  Chief Complaint  Patient presents with  . Pre-op Exam      History of Present Illness: Cristian Moore is a 77 y.o. male who presents for preop clearance prior to hip surgery.   He has a history of DM, nonobstructive CAD by cath 2014. In 2017 he  had a [employer] which demonstrated an EF of 48% with a defect present in the basal inferior and mid inferior location. This is most likely due to artifact but  inferior ischemia could not be ruled out.   Since I last saw him he is doing well.  Is going to have right hip replacement.  He is still able to be very active despite his hip pain.  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 The patient denies any new symptoms such as chest discomfort, neck or arm discomfort. There has been no new shortness of breath, PND or orthopnea. There have been no reported palpitations, presyncope or syncope.   He does have some chronic dyspnea and has been treated for asthma by Dr. Creig Hines.   Past Medical History:  Diagnosis Date  . Arthritis   . Asthma   . Benign prostatic hypertrophy   . Carotid stenosis    Carotid US (07/2013):  Bilateral 1-39% ICA (f/u 2 yrs)  . Chest pain    a. 01/2013 Cath: LM nl, LAD 25p, D1 25p, LCX nl, RCA 25p, PDA nl, RPL nl, EF 55%;  b. 03/2015 MV: EF 59%, no ischemia; c. 03/2016 MV: basal inf/mid inf defect, likely artifact, EF 48%, low risk.  . Chronic Dyspnea on exertion    a. See cardiac eval under CAD heading;  b. 03/2015 Echo: EF 50-55%, gr1 DD, triv AI;  b. 03/2015 CTA Chest: No PE;  c. 05/2016 CPX: no significant cardi-pulmonary limitation noted. Suspect obesity contributing to exertional symptoms.  . Diabetes mellitus without complication ([place]) 4098  . Difficulty urinating   .  Diverticulitis   . Diverticulosis   . Exogenous obesity   . GERD (gastroesophageal reflux disease)   . H/O hiatal hernia   . Hernia    Umbulical   . Hyperlipidemia   . Laceration  01/11/2009    Deep laceration left index finger dorsally  . Musculoskeletal pain    in the right shoulder - S/P ROTATOR CUFF REPAIR-BUT STILL HAS PAIN  . Obstructive sleep apnea     CPAP    AUTO SET 6 TO 20 - USUALLY SETTLES OUT AT 10  . Rectal bleeding    PT ATTRIBUTES TO HEMORRHOIDS  . SUPRAVENTRICULAR TACHYCARDIA 08/01/2009   Qualifier: Diagnosis of  By: Lovette Cliche, CNA, Christy    . Thyroiditis   . Ulcer 2008    Past Surgical History:  Procedure Laterality Date  . COLONOSCOPY     for polyps  . ELBOW BURSA SURGERY    . Eyelid Surgery Bilateral 05/2013  . INGUINAL HERNIA REPAIR    . KIDNEY STONE SURGERY    . KNEE SURGERY    . LAPAROSCOPIC SIGMOID COLECTOMY  07/23/2012   Procedure: LAPAROSCOPIC SIGMOID COLECTOMY;  Surgeon: [name], MD;  Location: [facility];  Service: General;  Laterality: N/A;  Laparoscopic Sigmoid Colectomy  .  LEFT HEART CATHETERIZATION WITH CORONARY ANGIOGRAM N/A 02/22/2013   Procedure: LEFT HEART CATHETERIZATION WITH CORONARY ANGIOGRAM;  Surgeon: [name], MD;  Location: [facility] CATH LAB;  Service: Cardiovascular;  Laterality: N/A;  . PROCTOSCOPY  07/23/2012   Procedure: PROCTOSCOPY;  Surgeon: [name], MD;  Location: [facility];  Service: General;  Laterality: N/A;  Rigid Proctoscopy  . ROTATOR CUFF REPAIR    . TONSILLECTOMY    . UMBILICAL HERNIA REPAIR  07/23/2012   Procedure: HERNIA REPAIR UMBILICAL ADULT;  Surgeon: [name], MD;  Location: [facility];  Service: General;  Laterality: N/A;  Primary Umbilical Hernia Repair     Current Outpatient Medications  Medication Sig Dispense Refill  . albuterol (PROVENTIL HFA;VENTOLIN HFA) 108 (90 Base) MCG/ACT inhaler Inhale 2 puffs into the lungs every 6 (six) hours as needed for wheezing or shortness of breath.    Marland Kitchen aspirin 81  MG tablet Take 81 mg by mouth daily.    . cholecalciferol (VITAMIN D) 1000 UNITS tablet Take 2,000 Units by mouth daily.     . Coenzyme Q10 (CO Q-10) 100 MG CAPS Take 100 mg by mouth daily.    Marland Kitchen escitalopram (LEXAPRO) 10 MG tablet Take 1 tablet (10 mg total) by mouth daily. as directed 90 tablet 3  . fluticasone (FLONASE) 50 MCG/ACT nasal spray USE 2 SPRAYS IN EACH       NOSTRIL DAILY 48 g 1  . metFORMIN (GLUCOPHAGE-XR) 500 MG 24 hr tablet TAKE 1 TABLET DAILY WITH   BREAKFAST 90 tablet 0  . metoprolol tartrate (LOPRESSOR) 50 MG tablet TAKE 1/2 TABLET TWICE A DAY 90 tablet 0  . montelukast (SINGULAIR) 10 MG tablet Take 1 tablet (10 mg total) by mouth at bedtime. 90 tablet 3  . Multiple Vitamins-Minerals (CENTRUM SILVER PO) Take 1 tablet by mouth daily.    . nitroGLYCERIN (NITROSTAT) 0.4 MG SL tablet DISSOLVE ONE TABLET UNDER THE TONGUE EVERY 5 MINUTES AS NEEDED FOR CHEST PAIN.  DO NOT EXCEED A TOTAL OF 3 DOSES IN 15 MINUTES 25 tablet 11  . ranitidine (ZANTAC) 150 MG capsule Take 150 mg by mouth 2 (two) times daily.    . simvastatin (ZOCOR) 20 MG tablet TAKE 1 TABLET AT BEDTIME 90 tablet 1  . sodium chloride (OCEAN) 0.65 % nasal spray Place 1 spray into the nose as needed for congestion.    Marland Kitchen Spacer/Aero-Holding Chambers (AEROCHAMBER MV) inhaler Use as instructed 1 each 0  . sucralfate (CARAFATE) 1 g tablet Take 1 tablet by mouth  twice a day 180 tablet 1  . tamsulosin (FLOMAX) 0.4 MG CAPS capsule Take 0.4 mg by mouth daily after supper.    . zolpidem (AMBIEN) 10 MG tablet TAKE ONE TABLET AT BEDTIME 90 tablet 0  . ONETOUCH DELICA LANCETS 28J MISC Check BS BID and PRN. DX. E11.8 100 each 11  . ONETOUCH VERIO test strip USE TO CHECK BLOOD SUGAR TWICE DAILY AND AS NEEDED 100 each 11  . PRESCRIPTION MEDICATION Inhale 1 application into the lungs at bedtime. "CPAP" MACHINE     No current facility-administered medications for this visit.     Allergies:   Penicillins    ROS:  Please see the history  of present illness.   Otherwise, review of systems are positive for pain on the dorsum of the right foot .   All other systems are reviewed and negative.    PHYSICAL EXAM: VS:  BP 120/84   Pulse 64   Ht 6\' 1"  (  1.854 m)   Wt 269 lb (122 kg)   BMI 35.49 kg/m  , BMI Body mass index is 35.49 kg/m. GENERAL:  Well appearing NECK:  No jugular venous distention, waveform within normal limits, carotid upstroke brisk and symmetric, no bruits, no thyromegaly LUNGS:  Clear to auscultation bilaterally CHEST:  Unremarkable HEART:  PMI not displaced or sustained,S1 and S2 within normal limits, no S3, no S4, no clicks, no rubs, no murmurs ABD:  Flat, positive bowel sounds normal in frequency in pitch, no bruits, no rebound, no guarding, no midline pulsatile mass, no hepatomegaly, no splenomegaly EXT:  2 plus pulses throughout, no edema, no cyanosis no clubbing   EKG:  EKG is ordered today. Sinus rhythm, rate 64, axis within normal limits, intervals, no acute ST-T wave changes.  03/18/2018  Recent Labs: 10/30/2017: ALT 21; BUN 14; Creatinine, Ser 0.94; Hemoglobin 14.8; Platelets 284; Potassium 4.7; Sodium 138    Lipid Panel    Component Value Date/Time   CHOL 156 10/30/2017 0837   TRIG 151 (H) 10/30/2017 0837   TRIG 111 01/22/2017 1027   HDL 50 10/30/2017 0837   HDL 39 (L) 01/22/2017 1027   CHOLHDL 3.1 10/30/2017 0837   CHOLHDL 3.1 02/21/2013 0212   VLDL 21 02/21/2013 0212   LDLCALC 76 10/30/2017 0837   LDLCALC 89 01/31/2014 1010      Wt Readings from Last 3 Encounters:  03/18/18 269 lb (122 kg)  03/09/18 265 lb (120.2 kg)  10/30/17 271 lb (122.9 kg)      Other studies Reviewed: Additional studies/ records that were reviewed today include: Labs Review of the above records demonstrates:   See elsewhere   ASSESSMENT AND PLAN:  PREOP:   The patient has no high risk findings.  He is not going for high risk procedure and he has at least a moderate functional capacity.  Given this and  according to ACC/AHA guidelines he is at acceptable risk for the planned surgery without further testing.  CAD: He will continue with risk reduction.  No change in therapy.  HTN: Blood pressure is at target.  He will continue with the meds as listed.   DYSLIPIDEMIA:   His lipid profile is at target and he will continue with meds as listed.  CAROTID STENOSIS:   This was less than 50% bilateral in 2018.   I will follow-up with carotid Dopplers in March of next year.  OVERWEIGHT:   We discussed weight loss strategies.  Current medicines are reviewed at length with the patient today.  The patient does not have concerns regarding medicines.  The following changes have been made:  None  Labs/ tests ordered today include:   Orders Placed This Encounter  Procedures  . US Carotid Duplex Bilateral  . EKG 12-Lead    Follow up with me in one year or so.    Signed, [name], MD  03/18/2018 10:08 AM    [place]

## 2018-03-18 ENCOUNTER — Encounter: Payer: Self-pay | Admitting: Cardiology

## 2018-03-18 ENCOUNTER — Ambulatory Visit: Payer: Medicare HMO | Admitting: Cardiology

## 2018-03-18 VITALS — BP 120/84 | HR 64 | Ht 73.0 in | Wt 269.0 lb

## 2018-03-18 DIAGNOSIS — Z01810 Encounter for preprocedural cardiovascular examination: Secondary | ICD-10-CM | POA: Diagnosis not present

## 2018-03-18 DIAGNOSIS — I2510 Atherosclerotic heart disease of native coronary artery without angina pectoris: Secondary | ICD-10-CM

## 2018-03-18 DIAGNOSIS — I6523 Occlusion and stenosis of bilateral carotid arteries: Secondary | ICD-10-CM

## 2018-03-18 DIAGNOSIS — I10 Essential (primary) hypertension: Secondary | ICD-10-CM | POA: Diagnosis not present

## 2018-03-18 NOTE — Patient Instructions (Signed)
Medication Instructions:  The current medical regimen is effective;  continue present plan and medications.  Testing/Procedures: Your physician has requested that you have a carotid duplex in March 2020. This test is an ultrasound of the carotid arteries in your neck. It looks at blood flow through these arteries that supply the brain with blood. Allow one hour for this exam. There are no restrictions or special instructions.  You will be contacted to be scheduled for this testing when due.  Follow-Up: Follow up in 1 year with Dr. Percival Spanish.  You will receive a letter in the mail 2 months before you are due.  Please call us when you receive this letter to schedule your follow up appointment.  If you need a refill on your cardiac medications before your next appointment, please call your pharmacy.  Thank you for choosing [facility]!!

## 2018-03-24 ENCOUNTER — Other Ambulatory Visit: Payer: Self-pay | Admitting: Family Medicine

## 2018-03-26 NOTE — Telephone Encounter (Signed)
Last seen 03/09/18  DWM 

## 2018-04-02 ENCOUNTER — Other Ambulatory Visit: Payer: Self-pay | Admitting: Family Medicine

## 2018-04-02 ENCOUNTER — Telehealth: Payer: Self-pay | Admitting: Family Medicine

## 2018-04-02 MED ORDER — ESCITALOPRAM OXALATE 10 MG PO TABS: 10.0000 mg | ORAL_TABLET | Freq: Every day | ORAL | 3 refills | Status: DC

## 2018-04-02 NOTE — Telephone Encounter (Signed)
Refill sent to new pharm

## 2018-04-02 NOTE — Telephone Encounter (Signed)
What is the name of the medication? escitalopram (LEXAPRO) 10 MG tablet  Have you contacted your pharmacy to request a refill? no  Which pharmacy would you like this sent to? [facility] in [place]  Where he was getting her meds, it is too expensive and at [facility] it will be $8.  Patient notified that their request is being sent to the clinical staff for review and that they should receive a call once it is complete. If they do not receive a call within 24 hours they can check with their pharmacy or our office.

## 2018-04-08 ENCOUNTER — Ambulatory Visit (INDEPENDENT_AMBULATORY_CARE_PROVIDER_SITE_OTHER): Payer: Medicare HMO | Admitting: *Deleted

## 2018-04-08 DIAGNOSIS — Z23 Encounter for immunization: Secondary | ICD-10-CM | POA: Diagnosis not present

## 2018-04-22 ENCOUNTER — Other Ambulatory Visit: Payer: Self-pay | Admitting: Family Medicine

## 2018-04-22 DIAGNOSIS — I2510 Atherosclerotic heart disease of native coronary artery without angina pectoris: Secondary | ICD-10-CM

## 2018-04-22 DIAGNOSIS — I10 Essential (primary) hypertension: Secondary | ICD-10-CM

## 2018-04-29 ENCOUNTER — Ambulatory Visit (INDEPENDENT_AMBULATORY_CARE_PROVIDER_SITE_OTHER): Payer: Medicare HMO | Admitting: Family Medicine

## 2018-04-29 ENCOUNTER — Encounter: Payer: Self-pay | Admitting: Family Medicine

## 2018-04-29 VITALS — BP 118/66 | HR 67 | Temp 97.1°F | Ht 73.0 in | Wt 267.0 lb

## 2018-04-29 DIAGNOSIS — M150 Primary generalized (osteo)arthritis: Secondary | ICD-10-CM

## 2018-04-29 DIAGNOSIS — M159 Polyosteoarthritis, unspecified: Secondary | ICD-10-CM

## 2018-04-29 DIAGNOSIS — M5116 Intervertebral disc disorders with radiculopathy, lumbar region: Secondary | ICD-10-CM

## 2018-04-29 DIAGNOSIS — M8949 Other hypertrophic osteoarthropathy, multiple sites: Secondary | ICD-10-CM

## 2018-04-29 MED ORDER — PREDNISONE 10 MG PO TABS: ORAL_TABLET | ORAL | 0 refills | Status: DC

## 2018-04-29 NOTE — Progress Notes (Signed)
Subjective:  Patient ID: Cristian Moore, male    DOB: 1941/05/10  Age: 77 y.o. MRN: [id number]  CC: Shoulder Pain (left)   HPI Cristian Moore presents for pain in multiple joints.  Initially he wants an injection into the right shoulder but then states that he is still having back pain in spite of shots he is having knee pain coming back from injection 6 months ago.  Pain in the shoulder has been increasing significantly.  It is a deep ache.  Pain in the knees is just beginning to recur as the shots were off from 6 months ago.  He is having right hip surgery in 1 month.  Will be a joint replacement by Dr. Wynelle Link.  Previously he has had shoulder surgery.  After discussion of injection he realizes that that will only give limited if any relief to the other areas so he opts for systemic treatment as discussed.  Patient is a diabetic and he has had injections and prednisone before it does raise his blood sugar somewhat but not excessively.  Depression screen [facility] 2/9 04/29/2018 03/09/2018 10/30/2017  Decreased Interest 0 0 0  Down, Depressed, Hopeless 0 0 0  PHQ - 2 Score 0 0 0  Altered sleeping - - -  Tired, decreased energy - - -  Change in appetite - - -  Feeling bad or failure about yourself  - - -  Trouble concentrating - - -  Moving slowly or fidgety/restless - - -  Suicidal thoughts - - -  PHQ-9 Score - - -  Difficult doing work/chores - - -    History Cristian Moore has a past medical history of Arthritis, Asthma, Benign prostatic hypertrophy, Carotid stenosis, Chest pain, Chronic Dyspnea on exertion, Diabetes mellitus without complication ([place]) (7124), Difficulty urinating, Diverticulitis, Diverticulosis, Exogenous obesity, GERD (gastroesophageal reflux disease), H/O hiatal hernia, Hernia, Hyperlipidemia, Laceration ( 01/11/2009 ), Musculoskeletal pain, Obstructive sleep apnea, Rectal bleeding, SUPRAVENTRICULAR TACHYCARDIA (08/01/2009), Thyroiditis, and Ulcer (2008).   He has a past surgical  history that includes Inguinal hernia repair; Kidney stone surgery; Tonsillectomy; Colonoscopy; Rotator cuff repair; Knee surgery; Elbow bursa surgery; Laparoscopic sigmoid colectomy (07/23/2012); Umbilical hernia repair (07/23/2012); Proctoscopy (07/23/2012); Eyelid Surgery (Bilateral, 05/2013); and left heart catheterization with coronary angiogram (N/A, 02/22/2013).   His family history includes Heart disease in his mother; Lung cancer in his maternal uncle; Stroke in his mother.He reports that he quit smoking about 18 years ago. His smoking use included cigars. He has never used smokeless tobacco. He reports that he does not drink alcohol or use drugs.    ROS Review of Systems  Constitutional: Negative for fever.  HENT: Negative.   Respiratory: Negative for shortness of breath.   Cardiovascular: Negative for chest pain.  Musculoskeletal: Positive for arthralgias, back pain, joint swelling and myalgias.  Skin: Negative for rash.    Objective:  BP 118/66   Pulse 67   Temp (!) 97.1 F (36.2 C) (Oral)   Ht 6\' 1"  (1.854 m)   Wt 267 lb (121.1 kg)   BMI 35.23 kg/m   BP Readings from Last 3 Encounters:  04/29/18 118/66  03/18/18 120/84  03/09/18 101/63    Wt Readings from Last 3 Encounters:  04/29/18 267 lb (121.1 kg)  03/18/18 269 lb (122 kg)  03/09/18 265 lb (120.2 kg)     Physical Exam  Constitutional: He is oriented to person, place, and time. He appears well-developed and well-nourished.  HENT:  Head: Normocephalic and atraumatic.  Right Ear: External ear normal.  Left Ear: External ear normal.  Mouth/Throat: No oropharyngeal exudate or posterior oropharyngeal erythema.  Eyes: Pupils are equal, round, and reactive to light.  Neck: Normal range of motion. Neck supple.  Cardiovascular: Normal rate and regular rhythm.  No murmur heard. Pulmonary/Chest: Breath sounds normal. No respiratory distress.  Musculoskeletal: He exhibits tenderness (right shoulder, both hips and  knees for palpation and ROM).  Neurological: He is alert and oriented to person, place, and time.  Vitals reviewed.     Assessment & Plan:   Cristian Moore was seen today for shoulder pain.  Diagnoses and all orders for this visit:  Lumbar disc disease with radiculopathy  Primary osteoarthritis involving multiple joints  Other orders -     predniSONE (DELTASONE) 10 MG tablet; Take 5 daily for 3 days followed by 4,3,2 and 1 for 3 days each.       I am having Cristian Moore start on predniSONE. I am also having him maintain his cholecalciferol, Co Q-10, Multiple Vitamins-Minerals (CENTRUM SILVER PO), sodium chloride, ONETOUCH DELICA LANCETS 21J, PRESCRIPTION MEDICATION, sucralfate, aspirin, nitroGLYCERIN, ranitidine, AEROCHAMBER MV, ONETOUCH VERIO, albuterol, montelukast, simvastatin, fluticasone, tamsulosin, zolpidem, metFORMIN, escitalopram, and metoprolol tartrate.  Allergies as of 04/29/2018      Reactions   Penicillins Rash   Over 60 years ago      Medication List        Accurate as of 04/29/18 11:28 AM. Always use your most recent med list.          AEROCHAMBER MV inhaler Use as instructed   albuterol 108 (90 Base) MCG/ACT inhaler Commonly known as:  PROVENTIL HFA;VENTOLIN HFA Inhale 2 puffs into the lungs every 6 (six) hours as needed for wheezing or shortness of breath.   aspirin 81 MG tablet Take 81 mg by mouth daily.   CENTRUM SILVER PO Take 1 tablet by mouth daily.   cholecalciferol 1000 units tablet Commonly known as:  VITAMIN D Take 2,000 Units by mouth daily.   Co Q-10 100 MG Caps Take 100 mg by mouth daily.   escitalopram 10 MG tablet Commonly known as:  LEXAPRO Take 1 tablet (10 mg total) by mouth daily. as directed   fluticasone 50 MCG/ACT nasal spray Commonly known as:  FLONASE USE 2 SPRAYS IN EACH       NOSTRIL DAILY   metFORMIN 500 MG 24 hr tablet Commonly known as:  GLUCOPHAGE-XR TAKE 1 TABLET DAILY WITH   BREAKFAST   metoprolol  tartrate 50 MG tablet Commonly known as:  LOPRESSOR TAKE 1/2 TABLET TWICE A DAY   montelukast 10 MG tablet Commonly known as:  SINGULAIR Take 1 tablet (10 mg total) by mouth at bedtime.   nitroGLYCERIN 0.4 MG SL tablet Commonly known as:  NITROSTAT DISSOLVE ONE TABLET UNDER THE TONGUE EVERY 5 MINUTES AS NEEDED FOR CHEST PAIN.  DO NOT EXCEED A TOTAL OF 3 DOSES IN 15 MINUTES   ONETOUCH DELICA LANCETS 94R Misc Check BS BID and PRN. DX. E11.8   ONETOUCH VERIO test strip Generic drug:  glucose blood USE TO CHECK BLOOD SUGAR TWICE DAILY AND AS NEEDED   predniSONE 10 MG tablet Commonly known as:  DELTASONE Take 5 daily for 3 days followed by 4,3,2 and 1 for 3 days each.   PRESCRIPTION MEDICATION Inhale 1 application into the lungs at bedtime. "CPAP" MACHINE   ranitidine 150 MG capsule Commonly known as:  ZANTAC Take 150 mg by mouth 2 (two) times daily.  simvastatin 20 MG tablet Commonly known as:  ZOCOR TAKE 1 TABLET AT BEDTIME   sodium chloride 0.65 % nasal spray Commonly known as:  OCEAN Place 1 spray into the nose as needed for congestion.   sucralfate 1 g tablet Commonly known as:  CARAFATE Take 1 tablet by mouth  twice a day   tamsulosin 0.4 MG Caps capsule Commonly known as:  FLOMAX Take 0.4 mg by mouth daily after supper.   zolpidem 10 MG tablet Commonly known as:  AMBIEN TAKE ONE TABLET AT BEDTIME        Follow-up: Return if symptoms worsen or fail to improve.  [name], M.D.

## 2018-05-01 ENCOUNTER — Ambulatory Visit: Payer: Medicare Other | Admitting: Cardiology

## 2018-05-12 NOTE — H&P (Signed)
TOTAL HIP ADMISSION H&P  Patient is admitted for right total hip arthroplasty.  Subjective:  Chief Complaint: right hip pain  HPI: Cristian Moore, 77 y.o. male, has a history of pain and functional disability in the right hip(s) due to arthritis and patient has failed non-surgical conservative treatments for greater than 12 weeks to include corticosteriod injections and activity modification.  Onset of symptoms was gradual starting 3 years ago with gradually worsening course since that time.The patient noted no past surgery on the right hip(s).  Patient currently rates pain in the right hip at 8 out of 10 with activity. Patient has worsening of pain with activity and weight bearing and stiffness. Patient has evidence of significant joint space narrowing with small marginal osteophytes but not completely bone-on-bone by imaging studies. This condition presents safety issues increasing the risk of falls. There is no current active infection.  Patient Active Problem List   Diagnosis Date Noted  . Preop cardiovascular exam 03/18/2018  . Pulmonary emphysema ([place]) 10/30/2017  . Aortic atherosclerosis ([place]) 10/30/2017  . Cough 09/16/2016  . Chest pain 03/20/2015  . Chest tightness 03/19/2015  . Numbness of left hand 03/19/2015  . Insomnia 07/19/2014  . Vitamin D deficiency 07/19/2014  . BPH (benign prostatic hyperplasia) 07/19/2014  . DM (diabetes mellitus), type 2 with complications ([place]) 47/42/5956  . H/O blepharoplasty 07/27/2013  . Acid reflux 06/03/2013  . Personal history of other diseases of circulatory system 06/03/2013  . History of PSVT (paroxysmal supraventricular tachycardia) 06/03/2013  . GERD (gastroesophageal reflux disease) 06/03/2013  . Hiatal hernia 06/03/2013  . Peripheral visual field defect of both eyes 05/18/2013  . Excess skin of eyelid 05/18/2013  . Non-Obstructive CAD 03/05/2013  . Essential hypertension 02/22/2013  . Shortness of breath   . Obstructive sleep  apnea   . Diverticulitis, recurrent, s/p lap sigmoid colectomy 07/23/2012 06/02/2012  . Carotid artery stenosis 09/28/2009  . Obesity (BMI 30-39.9) 08/02/2009  . HLD (hyperlipidemia) 08/01/2009   Past Medical History:  Diagnosis Date  . Arthritis   . Asthma   . Benign prostatic hypertrophy   . Carotid stenosis    Carotid US (07/2013):  Bilateral 1-39% ICA (f/u 2 yrs)  . Chest pain    a. 01/2013 Cath: LM nl, LAD 25p, D1 25p, LCX nl, RCA 25p, PDA nl, RPL nl, EF 55%;  b. 03/2015 MV: EF 59%, no ischemia; c. 03/2016 MV: basal inf/mid inf defect, likely artifact, EF 48%, low risk.  . Chronic Dyspnea on exertion    a. See cardiac eval under CAD heading;  b. 03/2015 Echo: EF 50-55%, gr1 DD, triv AI;  b. 03/2015 CTA Chest: No PE;  c. 05/2016 CPX: no significant cardi-pulmonary limitation noted. Suspect obesity contributing to exertional symptoms.  . Diabetes mellitus without complication ([place]) 3875  . Difficulty urinating   . Diverticulitis   . Diverticulosis   . Exogenous obesity   . GERD (gastroesophageal reflux disease)   . H/O hiatal hernia   . Hernia    Umbulical   . Hyperlipidemia   . Laceration  01/11/2009    Deep laceration left index finger dorsally  . Musculoskeletal pain    in the right shoulder - S/P ROTATOR CUFF REPAIR-BUT STILL HAS PAIN  . Obstructive sleep apnea     CPAP    AUTO SET 6 TO 20 - USUALLY SETTLES OUT AT 10  . Rectal bleeding    PT ATTRIBUTES TO HEMORRHOIDS  . SUPRAVENTRICULAR TACHYCARDIA 08/01/2009   Qualifier: Diagnosis  of  By: Lovette Cliche, CNA, Alyse Low    . Thyroiditis   . Ulcer 2008    Past Surgical History:  Procedure Laterality Date  . COLONOSCOPY     for polyps  . ELBOW BURSA SURGERY    . Eyelid Surgery Bilateral 05/2013  . INGUINAL HERNIA REPAIR    . KIDNEY STONE SURGERY    . KNEE SURGERY    . LAPAROSCOPIC SIGMOID COLECTOMY  07/23/2012   Procedure: LAPAROSCOPIC SIGMOID COLECTOMY;  Surgeon: [name], MD;  Location: [facility]S;  Service: General;   Laterality: N/A;  Laparoscopic Sigmoid Colectomy  . LEFT HEART CATHETERIZATION WITH CORONARY ANGIOGRAM N/A 02/22/2013   Procedure: LEFT HEART CATHETERIZATION WITH CORONARY ANGIOGRAM;  Surgeon: [name], MD;  Location: [facility] CATH LAB;  Service: Cardiovascular;  Laterality: N/A;  . PROCTOSCOPY  07/23/2012   Procedure: PROCTOSCOPY;  Surgeon: [name], MD;  Location: [facility];  Service: General;  Laterality: N/A;  Rigid Proctoscopy  . ROTATOR CUFF REPAIR    . TONSILLECTOMY    . UMBILICAL HERNIA REPAIR  07/23/2012   Procedure: HERNIA REPAIR UMBILICAL ADULT;  Surgeon: [name], MD;  Location: [facility];  Service: General;  Laterality: N/A;  Primary Umbilical Hernia Repair    No current facility-administered medications for this encounter.    Current Outpatient Medications  Medication Sig Dispense Refill Last Dose  . albuterol (PROVENTIL HFA;VENTOLIN HFA) 108 (90 Base) MCG/ACT inhaler Inhale 2 puffs into the lungs every 6 (six) hours as needed for wheezing or shortness of breath.   Taking  . aspirin 81 MG tablet Take 81 mg by mouth daily.   Taking  . cholecalciferol (VITAMIN D) 1000 UNITS tablet Take 2,000 Units by mouth daily.    Taking  . Coenzyme Q10 (CO Q-10) 100 MG CAPS Take 100 mg by mouth daily.   Taking  . escitalopram (LEXAPRO) 10 MG tablet Take 1 tablet (10 mg total) by mouth daily. as directed 90 tablet 3 Taking  . fluticasone (FLONASE) 50 MCG/ACT nasal spray USE 2 SPRAYS IN EACH       NOSTRIL DAILY 48 g 1 Taking  . metFORMIN (GLUCOPHAGE-XR) 500 MG 24 hr tablet TAKE 1 TABLET DAILY WITH   BREAKFAST 90 tablet 0 Taking  . metoprolol tartrate (LOPRESSOR) 50 MG tablet TAKE 1/2 TABLET TWICE A DAY 90 tablet 0 Taking  . montelukast (SINGULAIR) 10 MG tablet Take 1 tablet (10 mg total) by mouth at bedtime. 90 tablet 3 Taking  . Multiple Vitamins-Minerals (CENTRUM SILVER PO) Take 1 tablet by mouth daily.   Taking  . nitroGLYCERIN (NITROSTAT) 0.4 MG SL tablet DISSOLVE ONE TABLET UNDER THE  TONGUE EVERY 5 MINUTES AS NEEDED FOR CHEST PAIN.  DO NOT EXCEED A TOTAL OF 3 DOSES IN 15 MINUTES 25 tablet 11 Taking  . ONETOUCH DELICA LANCETS 08M MISC Check BS BID and PRN. DX. E11.8 100 each 11 Taking  . ONETOUCH VERIO test strip USE TO CHECK BLOOD SUGAR TWICE DAILY AND AS NEEDED 100 each 11 Taking  . predniSONE (DELTASONE) 10 MG tablet Take 5 daily for 3 days followed by 4,3,2 and 1 for 3 days each. 45 tablet 0   . PRESCRIPTION MEDICATION Inhale 1 application into the lungs at bedtime. "CPAP" MACHINE   Taking  . ranitidine (ZANTAC) 150 MG capsule Take 150 mg by mouth 2 (two) times daily.   Taking  . simvastatin (ZOCOR) 20 MG tablet TAKE 1 TABLET AT BEDTIME 90 tablet 1 Taking  . sodium chloride (  OCEAN) 0.65 % nasal spray Place 1 spray into the nose as needed for congestion.   Taking  . Spacer/Aero-Holding Chambers (AEROCHAMBER MV) inhaler Use as instructed 1 each 0 Taking  . sucralfate (CARAFATE) 1 g tablet Take 1 tablet by mouth  twice a day 180 tablet 1 Taking  . tamsulosin (FLOMAX) 0.4 MG CAPS capsule Take 0.4 mg by mouth daily after supper.   Taking  . zolpidem (AMBIEN) 10 MG tablet TAKE ONE TABLET AT BEDTIME 90 tablet 0 Taking   Allergies  Allergen Reactions  . Penicillins Rash    Over 60 years ago    Social History   Tobacco Use  . Smoking status: Former Smoker    Types: Cigars    Last attempt to quit: 08/12/1999    Years since quitting: 18.7  . Smokeless tobacco: Never Used  . Tobacco comment: smoked cigars on and off for approx 4-5 yrs  Substance Use Topics  . Alcohol use: No    Family History  Problem Relation Age of Onset  . Stroke Mother   . Heart disease Mother        Valvular heart disease, Rheumatic fever  . Lung cancer Maternal Uncle      Review of Systems  Constitutional: Negative for chills and fever.  HENT: Negative for congestion, sore throat and tinnitus.   Eyes: Negative for double vision, photophobia and pain.  Respiratory: Negative for cough,  shortness of breath and wheezing.   Cardiovascular: Negative for chest pain, palpitations and orthopnea.  Gastrointestinal: Negative for heartburn, nausea and vomiting.  Genitourinary: Negative for dysuria, frequency and urgency.  Musculoskeletal: Positive for joint pain.  Neurological: Negative for dizziness, weakness and headaches.    Objective:  Physical Exam  Well nourished and well developed. General: Alert and oriented x3, cooperative and pleasant, no acute distress. Head: normocephalic, atraumatic, neck supple. Eyes: EOMI. Respiratory: breath sounds clear in all fields, no wheezing, rales, or rhonchi. Cardiovascular: Regular rate and rhythm, no murmurs, gallops or rubs.  Abdomen: non-tender to palpation and soft, normoactive bowel sounds. Musculoskeletal: Significantly antalgic gait while trying to ambulate.  Right Hip Exam:ROM: Flexion to 90, Internal Rotation 10-15, External Rotation 20, and abduction 20 without discomfort. There is no tenderness over the greater trochanter bursa.  Left Hip Exam: ROM: is normal without discomfort. There is no tenderness over the greater trochanter bursa. There is no pain on provocative testing of the hip. Calves soft and nontender. Motor function intact in LE. Strength 5/5 LE bilaterally. Neuro: Distal pulses 2+. Sensation to light touch intact in LE.  Vital signs in last 24 hours: Blood pressure: 130/78 mmHg Pulse: 56 bpm  Labs:   Estimated body mass index is 35.23 kg/m as calculated from the following:   Height as of 04/29/18: 6\' 1"  (1.854 m).   Weight as of 04/29/18: 121.1 kg.   Imaging Review Plain radiographs demonstrate severe degenerative joint disease of the right hip(s). The bone quality appears to be adequate for age and reported activity level.    Preoperative templating of the joint replacement has been completed, documented, and submitted to the Operating Room personnel in order to optimize intra-operative equipment  management.     Assessment/Plan:  End stage arthritis, right hip(s)  The patient history, physical examination, clinical judgement of the provider and imaging studies are consistent with end stage degenerative joint disease of the right hip(s) and total hip arthroplasty is deemed medically necessary. The treatment options including medical management, injection therapy, arthroscopy and  arthroplasty were discussed at length. The risks and benefits of total hip arthroplasty were presented and reviewed. The risks due to aseptic loosening, infection, stiffness, dislocation/subluxation,  thromboembolic complications and other imponderables were discussed.  The patient acknowledged the explanation, agreed to proceed with the plan and consent was signed. Patient is being admitted for inpatient treatment for surgery, pain control, PT, OT, prophylactic antibiotics, VTE prophylaxis, progressive ambulation and ADL's and discharge planning.The patient is planning to be discharged home.   Therapy Plans: HHPT versus HEP Disposition: Home with wife Planned DVT Prophylaxis: Aspirin 325 mg BID DME needed: None PCP: Dr. Laurance Flatten Cardiologist: Dr. Percival Spanish TXA: IV Allergies: PCN (hives) Anesthesia Concerns: None BMI: 36.1 Last HgbA1c: "Around 5%." Lab will be added to pre-operative orders.  - Patient was instructed on what medications to stop prior to surgery. - Follow-up visit in 2 weeks with Dr. Wynelle Link - Begin physical therapy following surgery - Pre-operative lab work as pre-surgical testing - Prescriptions will be provided in hospital at time of discharge  Theresa Duty, PA-C Orthopedic Surgery [facility] [place]

## 2018-05-18 ENCOUNTER — Encounter (HOSPITAL_COMMUNITY): Payer: Self-pay

## 2018-05-18 NOTE — Patient Instructions (Addendum)
Cristian Moore  05/18/2018   Your procedure is scheduled on: 05-27-18   Report to [facility] Main  Entrance   Report to admitting at    0530 AM    Call this number if you have problems the morning of surgery [phone]    Remember: Do not eat food or drink liquids :After Midnight.   BRUSH YOUR TEETH MORNING OF SURGERY AND RINSE YOUR MOUTH OUT, NO CHEWING GUM CANDY OR MINTS.     Take these medicines the morning of surgery with A SIP OF WATER: metoprolol, flonase, lexapro, inhaler and bring DO NOT TAKE ANY DIABETIC MEDICATIONS DAY OF YOUR SURGERY                               You may not have any metal on your body including hair pins and              piercings  Do not wear jewelry,, lotions, powders or perfumes, deodorant                    Men may shave face and neck.   Do not bring valuables to the hospital. [place].  Contacts, dentures or bridgework may not be worn into surgery.  Leave suitcase in the car. After surgery it may be brought to your room.                 Please read over the following fact sheets you were given: ____________________________________________________________________           [facility] - Preparing for Surgery Before surgery, you can play an important role.  Because skin is not sterile, your skin needs to be as free of germs as possible.  You can reduce the number of germs on your skin by washing with CHG (chlorahexidine gluconate) soap before surgery.  CHG is an antiseptic cleaner which kills germs and bonds with the skin to continue killing germs even after washing. Please DO NOT use if you have an allergy to CHG or antibacterial soaps.  If your skin becomes reddened/irritated stop using the CHG and inform your nurse when you arrive at Short Stay. Do not shave (including legs and underarms) for at least 48 hours prior to the first CHG shower.  You may shave your  face/neck. Please follow these instructions carefully:  1.  Shower with CHG Soap the night before surgery and the  morning of Surgery.  2.  If you choose to wash your hair, wash your hair first as usual with your  normal  shampoo.  3.  After you shampoo, rinse your hair and body thoroughly to remove the  shampoo.                           4.  Use CHG as you would any other liquid soap.  You can apply chg directly  to the skin and wash                       Gently with a scrungie or clean washcloth.  5.  Apply the CHG Soap to your body ONLY FROM THE NECK DOWN.   Do  not use on face/ open                           Wound or open sores. Avoid contact with eyes, ears mouth and genitals (private parts).                       Wash face,  Genitals (private parts) with your normal soap.             6.  Wash thoroughly, paying special attention to the area where your surgery  will be performed.  7.  Thoroughly rinse your body with warm water from the neck down.  8.  DO NOT shower/wash with your normal soap after using and rinsing off  the CHG Soap.                9.  Pat yourself dry with a clean towel.            10.  Wear clean pajamas.            11.  Place clean sheets on your bed the night of your first shower and do not  sleep with pets. Day of Surgery : Do not apply any lotions/deodorants the morning of surgery.  Please wear clean clothes to the hospital/surgery center.  FAILURE TO FOLLOW THESE INSTRUCTIONS MAY RESULT IN THE CANCELLATION OF YOUR SURGERY PATIENT SIGNATURE_________________________________  NURSE SIGNATURE__________________________________  ________________________________________________________________________  WHAT IS A BLOOD TRANSFUSION? Blood Transfusion Information  A transfusion is the replacement of blood or some of its parts. Blood is made up of multiple cells which provide different functions.  Red blood cells carry oxygen and are used for blood loss  replacement.  White blood cells fight against infection.  Platelets control bleeding.  Plasma helps clot blood.  Other blood products are available for specialized needs, such as hemophilia or other clotting disorders. BEFORE THE TRANSFUSION  Who gives blood for transfusions?   Healthy volunteers who are fully evaluated to make sure their blood is safe. This is blood bank blood. Transfusion therapy is the safest it has ever been in the practice of medicine. Before blood is taken from a donor, a complete history is taken to make sure that person has no history of diseases nor engages in risky social behavior (examples are intravenous drug use or sexual activity with multiple partners). The donor's travel history is screened to minimize risk of transmitting infections, such as malaria. The donated blood is tested for signs of infectious diseases, such as HIV and hepatitis. The blood is then tested to be sure it is compatible with you in order to minimize the chance of a transfusion reaction. If you or a relative donates blood, this is often done in anticipation of surgery and is not appropriate for emergency situations. It takes many days to process the donated blood. RISKS AND COMPLICATIONS Although transfusion therapy is very safe and saves many lives, the main dangers of transfusion include:   Getting an infectious disease.  Developing a transfusion reaction. This is an allergic reaction to something in the blood you were given. Every precaution is taken to prevent this. The decision to have a blood transfusion has been considered carefully by your caregiver before blood is given. Blood is not given unless the benefits outweigh the risks. AFTER THE TRANSFUSION  Right after receiving a blood transfusion, you will usually feel much better and more energetic. This is especially true  if your red blood cells have gotten low (anemic). The transfusion raises the level of the red blood cells which  carry oxygen, and this usually causes an energy increase.  The nurse administering the transfusion will monitor you carefully for complications. HOME CARE INSTRUCTIONS  No special instructions are needed after a transfusion. You may find your energy is better. Speak with your caregiver about any limitations on activity for underlying diseases you may have. SEEK MEDICAL CARE IF:   Your condition is not improving after your transfusion.  You develop redness or irritation at the intravenous (IV) site. SEEK IMMEDIATE MEDICAL CARE IF:  Any of the following symptoms occur over the next 12 hours:  Shaking chills.  You have a temperature by mouth above 102 F (38.9 C), not controlled by medicine.  Chest, back, or muscle pain.  People around you feel you are not acting correctly or are confused.  Shortness of breath or difficulty breathing.  Dizziness and fainting.  You get a rash or develop hives.  You have a decrease in urine output.  Your urine turns a dark color or changes to pink, red, or brown. Any of the following symptoms occur over the next 10 days:  You have a temperature by mouth above 102 F (38.9 C), not controlled by medicine.  Shortness of breath.  Weakness after normal activity.  The white part of the eye turns yellow (jaundice).  You have a decrease in the amount of urine or are urinating less often.  Your urine turns a dark color or changes to pink, red, or brown. Document Released: 06/14/2000 Document Revised: 09/09/2011 Document Reviewed: 02/01/2008 ExitCare Patient Information 2014 [place].  _______________________________________________________________________  Incentive Spirometer  An incentive spirometer is a tool that can help keep your lungs clear and active. This tool measures how well you are filling your lungs with each breath. Taking long deep breaths may help reverse or decrease the chance of developing breathing (pulmonary) problems  (especially infection) following:  A long period of time when you are unable to move or be active. BEFORE THE PROCEDURE   If the spirometer includes an indicator to show your best effort, your nurse or respiratory therapist will set it to a desired goal.  If possible, sit up straight or lean slightly forward. Try not to slouch.  Hold the incentive spirometer in an upright position. INSTRUCTIONS FOR USE  1. Sit on the edge of your bed if possible, or sit up as far as you can in bed or on a chair. 2. Hold the incentive spirometer in an upright position. 3. Breathe out normally. 4. Place the mouthpiece in your mouth and seal your lips tightly around it. 5. Breathe in slowly and as deeply as possible, raising the piston or the ball toward the top of the column. 6. Hold your breath for 3-5 seconds or for as long as possible. Allow the piston or ball to fall to the bottom of the column. 7. Remove the mouthpiece from your mouth and breathe out normally. 8. Rest for a few seconds and repeat Steps 1 through 7 at least 10 times every 1-2 hours when you are awake. Take your time and take a few normal breaths between deep breaths. 9. The spirometer may include an indicator to show your best effort. Use the indicator as a goal to work toward during each repetition. 10. After each set of 10 deep breaths, practice coughing to be sure your lungs are clear. If you have an  incision (the cut made at the time of surgery), support your incision when coughing by placing a pillow or rolled up towels firmly against it. Once you are able to get out of bed, walk around indoors and cough well. You may stop using the incentive spirometer when instructed by your caregiver.  RISKS AND COMPLICATIONS  Take your time so you do not get dizzy or light-headed.  If you are in pain, you may need to take or ask for pain medication before doing incentive spirometry. It is harder to take a deep breath if you are having  pain. AFTER USE  Rest and breathe slowly and easily.  It can be helpful to keep track of a log of your progress. Your caregiver can provide you with a simple table to help with this. If you are using the spirometer at home, follow these instructions: [place] IF:   You are having difficultly using the spirometer.  You have trouble using the spirometer as often as instructed.  Your pain medication is not giving enough relief while using the spirometer.  You develop fever of 100.5 F (38.1 C) or higher. SEEK IMMEDIATE MEDICAL CARE IF:   You cough up bloody sputum that had not been present before.  You develop fever of 102 F (38.9 C) or greater.  You develop worsening pain at or near the incision site. MAKE SURE YOU:   Understand these instructions.  Will watch your condition.  Will get help right away if you are not doing well or get worse. Document Released: 10/28/2006 Document Revised: 09/09/2011 Document Reviewed: 12/29/2006 [facility] Patient Information 2014 [place], [place].   ________________________________________________________________________

## 2018-05-18 NOTE — Progress Notes (Signed)
Clearance 03-18-18 cardiology epic  ekg 03-18-18 epic  Stress 04-17-16 epic

## 2018-05-19 ENCOUNTER — Other Ambulatory Visit (HOSPITAL_COMMUNITY): Payer: Medicare HMO

## 2018-05-19 ENCOUNTER — Other Ambulatory Visit: Payer: Self-pay

## 2018-05-19 ENCOUNTER — Encounter (HOSPITAL_COMMUNITY)
Admission: RE | Admit: 2018-05-19 | Discharge: 2018-05-19 | Disposition: A | Discharge status: Home / Self Care | Payer: Medicare HMO | Source: Ambulatory Visit | Attending: Orthopedic Surgery | Admitting: Orthopedic Surgery

## 2018-05-19 ENCOUNTER — Encounter (HOSPITAL_COMMUNITY): Payer: Self-pay

## 2018-05-19 DIAGNOSIS — Z01812 Encounter for preprocedural laboratory examination: Secondary | ICD-10-CM | POA: Insufficient documentation

## 2018-05-19 HISTORY — DX: Pneumonia, unspecified organism: J18.9

## 2018-05-19 LAB — COMPREHENSIVE METABOLIC PANEL
ALK PHOS: 35 U/L — AB (ref 38–126)
ALT: 22 U/L (ref 0–44)
ANION GAP: 8 (ref 5–15)
AST: 22 U/L (ref 15–41)
Albumin: 4 g/dL (ref 3.5–5.0)
BILIRUBIN TOTAL: 0.8 mg/dL (ref 0.3–1.2)
BUN: 17 mg/dL (ref 8–23)
CALCIUM: 9.5 mg/dL (ref 8.9–10.3)
CO2: 29 mmol/L (ref 22–32)
CREATININE: 1.09 mg/dL (ref 0.61–1.24)
Chloride: 103 mmol/L (ref 98–111)
GFR calc non Af Amer: >60 mL/min (ref >60)
GLUCOSE: 104 mg/dL — AB (ref 70–99)
Potassium: 4.5 mmol/L (ref 3.5–5.1)
Sodium: 140 mmol/L (ref 135–145)
TOTAL PROTEIN: 6.8 g/dL (ref 6.5–8.1)

## 2018-05-19 LAB — CBC
HEMATOCRIT: 44.5 % (ref 39.0–52.0)
Hemoglobin: 14.3 g/dL (ref 13.0–17.0)
MCH: 31.4 pg (ref 26.0–34.0)
MCHC: 32.1 g/dL (ref 30.0–36.0)
MCV: 97.6 fL (ref 80.0–100.0)
Platelets: 189 10*3/uL (ref 150–400)
RBC: 4.56 MIL/uL (ref 4.22–5.81)
RDW: 13.7 % (ref 11.5–15.5)
WBC: 8.9 10*3/uL (ref 4.0–10.5)
nRBC: 0 % (ref 0.0–0.2)

## 2018-05-19 LAB — APTT: aPTT: 24 seconds (ref 24–36)

## 2018-05-19 LAB — HEMOGLOBIN A1C
HEMOGLOBIN A1C: 5.9 % — AB (ref 4.8–5.6)
Mean Plasma Glucose: 122.63 mg/dL

## 2018-05-19 LAB — SURGICAL PCR SCREEN
MRSA, PCR: NEGATIVE
STAPHYLOCOCCUS AUREUS: NEGATIVE

## 2018-05-19 LAB — PROTIME-INR
INR: 0.87
Prothrombin Time: 11.8 seconds (ref 11.4–15.2)

## 2018-05-26 MED ORDER — VANCOMYCIN HCL 10 G IV SOLR
1500.0000 mg | INTRAVENOUS | Status: AC
  Administered 2018-05-27: 1500 mg via INTRAVENOUS
  Filled 2018-05-26: qty 1500

## 2018-05-27 ENCOUNTER — Inpatient Hospital Stay (HOSPITAL_COMMUNITY): Payer: Medicare HMO | Admitting: Registered Nurse

## 2018-05-27 ENCOUNTER — Inpatient Hospital Stay (HOSPITAL_COMMUNITY): Payer: Medicare HMO

## 2018-05-27 ENCOUNTER — Other Ambulatory Visit: Payer: Self-pay

## 2018-05-27 ENCOUNTER — Encounter (HOSPITAL_COMMUNITY)
Admission: RE | Disposition: A | Discharge status: Home Health | Payer: Self-pay | Source: Home / Self Care | Attending: Orthopedic Surgery

## 2018-05-27 ENCOUNTER — Encounter (HOSPITAL_COMMUNITY): Payer: Self-pay

## 2018-05-27 ENCOUNTER — Inpatient Hospital Stay (HOSPITAL_COMMUNITY)
Admission: RE | Admit: 2018-05-27 | Discharge: 2018-05-28 | DRG: 470 | Disposition: A | Discharge status: Home Health | Payer: Medicare HMO | Attending: Orthopedic Surgery | Admitting: Orthopedic Surgery

## 2018-05-27 DIAGNOSIS — Z7982 Long term (current) use of aspirin: Secondary | ICD-10-CM

## 2018-05-27 DIAGNOSIS — G4733 Obstructive sleep apnea (adult) (pediatric): Secondary | ICD-10-CM | POA: Diagnosis present

## 2018-05-27 DIAGNOSIS — J449 Chronic obstructive pulmonary disease, unspecified: Secondary | ICD-10-CM | POA: Diagnosis present

## 2018-05-27 DIAGNOSIS — E119 Type 2 diabetes mellitus without complications: Secondary | ICD-10-CM | POA: Diagnosis present

## 2018-05-27 DIAGNOSIS — Z7984 Long term (current) use of oral hypoglycemic drugs: Secondary | ICD-10-CM

## 2018-05-27 DIAGNOSIS — N400 Enlarged prostate without lower urinary tract symptoms: Secondary | ICD-10-CM | POA: Diagnosis present

## 2018-05-27 DIAGNOSIS — Z8249 Family history of ischemic heart disease and other diseases of the circulatory system: Secondary | ICD-10-CM | POA: Diagnosis not present

## 2018-05-27 DIAGNOSIS — M169 Osteoarthritis of hip, unspecified: Secondary | ICD-10-CM

## 2018-05-27 DIAGNOSIS — G4700 Insomnia, unspecified: Secondary | ICD-10-CM | POA: Diagnosis present

## 2018-05-27 DIAGNOSIS — H5340 Unspecified visual field defects: Secondary | ICD-10-CM | POA: Diagnosis present

## 2018-05-27 DIAGNOSIS — M25751 Osteophyte, right hip: Secondary | ICD-10-CM | POA: Diagnosis present

## 2018-05-27 DIAGNOSIS — Z79899 Other long term (current) drug therapy: Secondary | ICD-10-CM

## 2018-05-27 DIAGNOSIS — E785 Hyperlipidemia, unspecified: Secondary | ICD-10-CM | POA: Diagnosis not present

## 2018-05-27 DIAGNOSIS — Z6834 Body mass index (BMI) 34.0-34.9, adult: Secondary | ICD-10-CM

## 2018-05-27 DIAGNOSIS — E6609 Other obesity due to excess calories: Secondary | ICD-10-CM | POA: Diagnosis present

## 2018-05-27 DIAGNOSIS — Z7951 Long term (current) use of inhaled steroids: Secondary | ICD-10-CM

## 2018-05-27 DIAGNOSIS — I2510 Atherosclerotic heart disease of native coronary artery without angina pectoris: Secondary | ICD-10-CM | POA: Diagnosis not present

## 2018-05-27 DIAGNOSIS — I700 Atherosclerosis of aorta: Secondary | ICD-10-CM | POA: Diagnosis not present

## 2018-05-27 DIAGNOSIS — Z471 Aftercare following joint replacement surgery: Secondary | ICD-10-CM | POA: Diagnosis not present

## 2018-05-27 DIAGNOSIS — I10 Essential (primary) hypertension: Secondary | ICD-10-CM | POA: Diagnosis not present

## 2018-05-27 DIAGNOSIS — Z823 Family history of stroke: Secondary | ICD-10-CM | POA: Diagnosis not present

## 2018-05-27 DIAGNOSIS — K449 Diaphragmatic hernia without obstruction or gangrene: Secondary | ICD-10-CM | POA: Diagnosis present

## 2018-05-27 DIAGNOSIS — Z96649 Presence of unspecified artificial hip joint: Secondary | ICD-10-CM

## 2018-05-27 DIAGNOSIS — Z880 Allergy status to penicillin: Secondary | ICD-10-CM

## 2018-05-27 DIAGNOSIS — K219 Gastro-esophageal reflux disease without esophagitis: Secondary | ICD-10-CM | POA: Diagnosis not present

## 2018-05-27 DIAGNOSIS — Z419 Encounter for procedure for purposes other than remedying health state, unspecified: Secondary | ICD-10-CM

## 2018-05-27 DIAGNOSIS — Z96641 Presence of right artificial hip joint: Secondary | ICD-10-CM | POA: Diagnosis not present

## 2018-05-27 DIAGNOSIS — M1611 Unilateral primary osteoarthritis, right hip: Principal | ICD-10-CM | POA: Diagnosis present

## 2018-05-27 DIAGNOSIS — Z87891 Personal history of nicotine dependence: Secondary | ICD-10-CM | POA: Diagnosis not present

## 2018-05-27 HISTORY — PX: TOTAL HIP ARTHROPLASTY: SHX124

## 2018-05-27 LAB — GLUCOSE, CAPILLARY
GLUCOSE-CAPILLARY: 178 mg/dL — AB (ref 70–99)
GLUCOSE-CAPILLARY: 174 mg/dL — AB (ref 70–99)
Glucose-Capillary: 106 mg/dL — ABNORMAL HIGH (ref 70–99)
Glucose-Capillary: 112 mg/dL — ABNORMAL HIGH (ref 70–99)
Glucose-Capillary: 135 mg/dL — ABNORMAL HIGH (ref 70–99)

## 2018-05-27 LAB — TYPE AND SCREEN
ABO/RH(D): O POS
Antibody Screen: NEGATIVE

## 2018-05-27 SURGERY — ARTHROPLASTY, HIP, TOTAL, ANTERIOR APPROACH
Anesthesia: Monitor Anesthesia Care | Site: Hip | Laterality: Right

## 2018-05-27 MED ORDER — TRANEXAMIC ACID-NACL 1000-0.7 MG/100ML-% IV SOLN
1000.0000 mg | INTRAVENOUS | Status: AC
  Administered 2018-05-27: 1000 mg via INTRAVENOUS
  Filled 2018-05-27: qty 100

## 2018-05-27 MED ORDER — ESCITALOPRAM OXALATE 10 MG PO TABS
10.0000 mg | ORAL_TABLET | Freq: Every day | ORAL | Status: DC
  Administered 2018-05-28: 10 mg via ORAL
  Filled 2018-05-27: qty 1

## 2018-05-27 MED ORDER — HYDROCODONE-ACETAMINOPHEN 5-325 MG PO TABS: 1.0000 | ORAL_TABLET | Freq: Four times a day (QID) | ORAL | 0 refills | Status: DC | PRN

## 2018-05-27 MED ORDER — MONTELUKAST SODIUM 10 MG PO TABS
10.0000 mg | ORAL_TABLET | Freq: Every day | ORAL | Status: DC
  Administered 2018-05-27: 10 mg via ORAL
  Filled 2018-05-27: qty 1

## 2018-05-27 MED ORDER — MIDAZOLAM HCL 5 MG/5ML IJ SOLN
INTRAMUSCULAR | Status: DC | PRN
  Administered 2018-05-27: 1 mg via INTRAVENOUS

## 2018-05-27 MED ORDER — DEXAMETHASONE SODIUM PHOSPHATE 10 MG/ML IJ SOLN
10.0000 mg | Freq: Once | INTRAMUSCULAR | Status: AC
  Administered 2018-05-28: 10 mg via INTRAVENOUS
  Filled 2018-05-27: qty 1

## 2018-05-27 MED ORDER — NITROGLYCERIN 0.4 MG SL SUBL: 0.4000 mg | SUBLINGUAL_TABLET | SUBLINGUAL | Status: DC | PRN

## 2018-05-27 MED ORDER — PROPOFOL 500 MG/50ML IV EMUL
INTRAVENOUS | Status: DC | PRN
  Administered 2018-05-27: 40 ug/kg/min via INTRAVENOUS

## 2018-05-27 MED ORDER — METHOCARBAMOL 500 MG PO TABS: 500.0000 mg | ORAL_TABLET | Freq: Four times a day (QID) | ORAL | 0 refills | Status: DC | PRN

## 2018-05-27 MED ORDER — VANCOMYCIN HCL IN DEXTROSE 1-5 GM/200ML-% IV SOLN
1000.0000 mg | Freq: Two times a day (BID) | INTRAVENOUS | Status: AC
  Administered 2018-05-27: 1000 mg via INTRAVENOUS
  Filled 2018-05-27: qty 200

## 2018-05-27 MED ORDER — SUCRALFATE 1 G PO TABS: 1.0000 g | ORAL_TABLET | Freq: Every day | ORAL | Status: DC | PRN

## 2018-05-27 MED ORDER — METOCLOPRAMIDE HCL 5 MG PO TABS: 5.0000 mg | ORAL_TABLET | Freq: Three times a day (TID) | ORAL | Status: DC | PRN

## 2018-05-27 MED ORDER — ONDANSETRON HCL 4 MG/2ML IJ SOLN
INTRAMUSCULAR | Status: DC | PRN
  Administered 2018-05-27: 4 mg via INTRAVENOUS

## 2018-05-27 MED ORDER — MORPHINE SULFATE (PF) 4 MG/ML IV SOLN: 0.5000 mg | INTRAVENOUS | Status: DC | PRN

## 2018-05-27 MED ORDER — SODIUM CHLORIDE 0.9 % IV SOLN
INTRAVENOUS | Status: DC
  Administered 2018-05-27 – 2018-05-28 (×2): via INTRAVENOUS

## 2018-05-27 MED ORDER — ASPIRIN EC 325 MG PO TBEC
325.0000 mg | DELAYED_RELEASE_TABLET | Freq: Every day | ORAL | Status: DC
  Administered 2018-05-28: 325 mg via ORAL
  Filled 2018-05-27: qty 1

## 2018-05-27 MED ORDER — 0.9 % SODIUM CHLORIDE (POUR BTL) OPTIME
TOPICAL | Status: DC | PRN
  Administered 2018-05-27: 1000 mL

## 2018-05-27 MED ORDER — ACETAMINOPHEN 10 MG/ML IV SOLN
1000.0000 mg | Freq: Four times a day (QID) | INTRAVENOUS | Status: DC
  Administered 2018-05-27: 1000 mg via INTRAVENOUS
  Filled 2018-05-27: qty 100

## 2018-05-27 MED ORDER — ZOLPIDEM TARTRATE 5 MG PO TABS: 5.0000 mg | ORAL_TABLET | Freq: Every evening | ORAL | Status: DC | PRN

## 2018-05-27 MED ORDER — ONDANSETRON HCL 4 MG/2ML IJ SOLN
INTRAMUSCULAR | Status: AC
  Filled 2018-05-27: qty 2

## 2018-05-27 MED ORDER — HYDROCODONE-ACETAMINOPHEN 7.5-325 MG PO TABS
1.0000 | ORAL_TABLET | ORAL | Status: DC | PRN
  Administered 2018-05-27: 2 via ORAL
  Administered 2018-05-27: 1 via ORAL
  Filled 2018-05-27: qty 2
  Filled 2018-05-27: qty 1

## 2018-05-27 MED ORDER — PHENOL 1.4 % MT LIQD: 1.0000 | OROMUCOSAL | Status: DC | PRN

## 2018-05-27 MED ORDER — CHLORHEXIDINE GLUCONATE 4 % EX LIQD: 60.0000 mL | Freq: Once | CUTANEOUS | Status: DC

## 2018-05-27 MED ORDER — BUPIVACAINE-EPINEPHRINE (PF) 0.25% -1:200000 IJ SOLN
INTRAMUSCULAR | Status: DC | PRN
  Administered 2018-05-27: 30 mL

## 2018-05-27 MED ORDER — METOPROLOL TARTRATE 25 MG PO TABS: 25.0000 mg | ORAL_TABLET | Freq: Two times a day (BID) | ORAL | Status: DC

## 2018-05-27 MED ORDER — STERILE WATER FOR IRRIGATION IR SOLN
Status: DC | PRN
  Administered 2018-05-27: 2000 mL

## 2018-05-27 MED ORDER — SIMVASTATIN 20 MG PO TABS
20.0000 mg | ORAL_TABLET | Freq: Every day | ORAL | Status: DC
  Administered 2018-05-27: 20 mg via ORAL
  Filled 2018-05-27: qty 1

## 2018-05-27 MED ORDER — OXYCODONE HCL 5 MG PO TABS
ORAL_TABLET | ORAL | Status: AC
  Filled 2018-05-27: qty 1

## 2018-05-27 MED ORDER — MIDAZOLAM HCL 2 MG/2ML IJ SOLN
INTRAMUSCULAR | Status: AC
  Filled 2018-05-27: qty 2

## 2018-05-27 MED ORDER — ONDANSETRON HCL 4 MG/2ML IJ SOLN: 4.0000 mg | Freq: Four times a day (QID) | INTRAMUSCULAR | Status: DC | PRN

## 2018-05-27 MED ORDER — FENTANYL CITRATE (PF) 100 MCG/2ML IJ SOLN: 25.0000 ug | INTRAMUSCULAR | Status: DC | PRN

## 2018-05-27 MED ORDER — HYDROCODONE-ACETAMINOPHEN 5-325 MG PO TABS
1.0000 | ORAL_TABLET | ORAL | Status: DC | PRN
  Administered 2018-05-27 – 2018-05-28 (×3): 2 via ORAL
  Filled 2018-05-27 (×3): qty 2

## 2018-05-27 MED ORDER — POLYETHYLENE GLYCOL 3350 17 G PO PACK: 17.0000 g | PACK | Freq: Every day | ORAL | Status: DC | PRN

## 2018-05-27 MED ORDER — METHOCARBAMOL 500 MG PO TABS
500.0000 mg | ORAL_TABLET | Freq: Four times a day (QID) | ORAL | Status: DC | PRN
  Administered 2018-05-27: 500 mg via ORAL
  Filled 2018-05-27: qty 1

## 2018-05-27 MED ORDER — ACETAMINOPHEN 500 MG PO TABS
500.0000 mg | ORAL_TABLET | Freq: Four times a day (QID) | ORAL | Status: AC
  Administered 2018-05-28: 500 mg via ORAL
  Filled 2018-05-27: qty 1

## 2018-05-27 MED ORDER — ONDANSETRON HCL 4 MG PO TABS: 4.0000 mg | ORAL_TABLET | Freq: Four times a day (QID) | ORAL | Status: DC | PRN

## 2018-05-27 MED ORDER — TRANEXAMIC ACID-NACL 1000-0.7 MG/100ML-% IV SOLN
1000.0000 mg | Freq: Once | INTRAVENOUS | Status: AC
  Administered 2018-05-27: 1000 mg via INTRAVENOUS
  Filled 2018-05-27: qty 100

## 2018-05-27 MED ORDER — DIPHENHYDRAMINE HCL 12.5 MG/5ML PO ELIX: 12.5000 mg | ORAL_SOLUTION | ORAL | Status: DC | PRN

## 2018-05-27 MED ORDER — FENTANYL CITRATE (PF) 100 MCG/2ML IJ SOLN
INTRAMUSCULAR | Status: AC
  Filled 2018-05-27: qty 2

## 2018-05-27 MED ORDER — FENTANYL CITRATE (PF) 100 MCG/2ML IJ SOLN
INTRAMUSCULAR | Status: DC | PRN
  Administered 2018-05-27: 50 ug via INTRAVENOUS

## 2018-05-27 MED ORDER — PROPOFOL 10 MG/ML IV BOLUS
INTRAVENOUS | Status: AC
  Filled 2018-05-27: qty 40

## 2018-05-27 MED ORDER — PROPOFOL 10 MG/ML IV BOLUS
INTRAVENOUS | Status: AC
  Filled 2018-05-27: qty 20

## 2018-05-27 MED ORDER — FLEET ENEMA 7-19 GM/118ML RE ENEM: 1.0000 | ENEMA | Freq: Once | RECTAL | Status: DC | PRN

## 2018-05-27 MED ORDER — ALBUTEROL SULFATE (2.5 MG/3ML) 0.083% IN NEBU: 3.0000 mL | INHALATION_SOLUTION | Freq: Four times a day (QID) | RESPIRATORY_TRACT | Status: DC | PRN

## 2018-05-27 MED ORDER — BUPIVACAINE-EPINEPHRINE (PF) 0.25% -1:200000 IJ SOLN
INTRAMUSCULAR | Status: AC
  Filled 2018-05-27: qty 30

## 2018-05-27 MED ORDER — DEXAMETHASONE SODIUM PHOSPHATE 10 MG/ML IJ SOLN
INTRAMUSCULAR | Status: AC
  Filled 2018-05-27: qty 1

## 2018-05-27 MED ORDER — INSULIN ASPART 100 UNIT/ML ~~LOC~~ SOLN
0.0000 [IU] | Freq: Three times a day (TID) | SUBCUTANEOUS | Status: DC
  Administered 2018-05-27 (×2): 3 [IU] via SUBCUTANEOUS
  Administered 2018-05-28: 2 [IU] via SUBCUTANEOUS

## 2018-05-27 MED ORDER — ASPIRIN EC 325 MG PO TBEC: 325.0000 mg | DELAYED_RELEASE_TABLET | Freq: Two times a day (BID) | ORAL | 0 refills | Status: AC

## 2018-05-27 MED ORDER — OXYCODONE HCL 5 MG PO TABS
5.0000 mg | ORAL_TABLET | Freq: Once | ORAL | Status: AC | PRN
  Administered 2018-05-27: 5 mg via ORAL

## 2018-05-27 MED ORDER — METOPROLOL TARTRATE 25 MG PO TABS
25.0000 mg | ORAL_TABLET | Freq: Two times a day (BID) | ORAL | Status: DC
  Administered 2018-05-27 – 2018-05-28 (×2): 25 mg via ORAL
  Filled 2018-05-27 (×2): qty 1

## 2018-05-27 MED ORDER — BUPIVACAINE IN DEXTROSE 0.75-8.25 % IT SOLN
INTRATHECAL | Status: DC | PRN
  Administered 2018-05-27: 2 mL via INTRATHECAL

## 2018-05-27 MED ORDER — LACTATED RINGERS IV SOLN
INTRAVENOUS | Status: DC
  Administered 2018-05-27: 1000 mL via INTRAVENOUS

## 2018-05-27 MED ORDER — DEXAMETHASONE SODIUM PHOSPHATE 10 MG/ML IJ SOLN
8.0000 mg | Freq: Once | INTRAMUSCULAR | Status: AC
  Administered 2018-05-27: 8 mg via INTRAVENOUS

## 2018-05-27 MED ORDER — METHOCARBAMOL 500 MG IVPB - SIMPLE MED
INTRAVENOUS | Status: AC
  Administered 2018-05-27: 500 mg via INTRAVENOUS
  Filled 2018-05-27: qty 50

## 2018-05-27 MED ORDER — BISACODYL 10 MG RE SUPP: 10.0000 mg | Freq: Every day | RECTAL | Status: DC | PRN

## 2018-05-27 MED ORDER — FLUTICASONE PROPIONATE 50 MCG/ACT NA SUSP
2.0000 | Freq: Every day | NASAL | Status: DC
  Filled 2018-05-27: qty 16

## 2018-05-27 MED ORDER — OXYCODONE HCL 5 MG/5ML PO SOLN
5.0000 mg | Freq: Once | ORAL | Status: AC | PRN
  Filled 2018-05-27: qty 5

## 2018-05-27 MED ORDER — METHOCARBAMOL 500 MG IVPB - SIMPLE MED
500.0000 mg | Freq: Four times a day (QID) | INTRAVENOUS | Status: DC | PRN
  Administered 2018-05-27: 500 mg via INTRAVENOUS
  Filled 2018-05-27: qty 50

## 2018-05-27 MED ORDER — MENTHOL 3 MG MT LOZG: 1.0000 | LOZENGE | OROMUCOSAL | Status: DC | PRN

## 2018-05-27 MED ORDER — TAMSULOSIN HCL 0.4 MG PO CAPS
0.4000 mg | ORAL_CAPSULE | Freq: Every day | ORAL | Status: DC
  Administered 2018-05-27: 0.4 mg via ORAL
  Filled 2018-05-27: qty 1

## 2018-05-27 MED ORDER — DOCUSATE SODIUM 100 MG PO CAPS
100.0000 mg | ORAL_CAPSULE | Freq: Two times a day (BID) | ORAL | Status: DC
  Administered 2018-05-27 – 2018-05-28 (×2): 100 mg via ORAL
  Filled 2018-05-27 (×2): qty 1

## 2018-05-27 MED ORDER — METOCLOPRAMIDE HCL 5 MG/ML IJ SOLN: 5.0000 mg | Freq: Three times a day (TID) | INTRAMUSCULAR | Status: DC | PRN

## 2018-05-27 SURGICAL SUPPLY — 46 items
BAG DECANTER FOR FLEXI CONT (MISCELLANEOUS) ×2 IMPLANT
BAG ZIPLOCK 12X15 (MISCELLANEOUS) ×2 IMPLANT
BLADE SAG 18X100X1.27 (BLADE) ×2 IMPLANT
COVER PERINEAL POST (MISCELLANEOUS) ×2 IMPLANT
COVER SURGICAL LIGHT HANDLE (MISCELLANEOUS) ×2 IMPLANT
COVER WAND RF STERILE (DRAPES) ×2 IMPLANT
CUP ACETBLR 54 OD PINNACLE (Hips) ×2 IMPLANT
DECANTER SPIKE VIAL GLASS SM (MISCELLANEOUS) ×2 IMPLANT
DRAPE STERI IOBAN 125X83 (DRAPES) ×2 IMPLANT
DRAPE U-SHAPE 47X51 STRL (DRAPES) ×4 IMPLANT
DRSG ADAPTIC 3X8 NADH LF (GAUZE/BANDAGES/DRESSINGS) ×2 IMPLANT
DRSG MEPILEX BORDER 4X4 (GAUZE/BANDAGES/DRESSINGS) ×2 IMPLANT
DRSG MEPILEX BORDER 4X8 (GAUZE/BANDAGES/DRESSINGS) ×2 IMPLANT
DURAPREP 26ML APPLICATOR (WOUND CARE) ×2 IMPLANT
ELECT REM PT RETURN 15FT ADLT (MISCELLANEOUS) ×2 IMPLANT
EVACUATOR 1/8 PVC DRAIN (DRAIN) ×2 IMPLANT
GLOVE BIO SURGEON STRL SZ7 (GLOVE) ×2 IMPLANT
GLOVE BIO SURGEON STRL SZ8 (GLOVE) ×2 IMPLANT
GLOVE BIOGEL PI IND STRL 6.5 (GLOVE) ×1 IMPLANT
GLOVE BIOGEL PI IND STRL 7.0 (GLOVE) ×3 IMPLANT
GLOVE BIOGEL PI IND STRL 7.5 (GLOVE) ×5 IMPLANT
GLOVE BIOGEL PI IND STRL 8 (GLOVE) ×1 IMPLANT
GLOVE BIOGEL PI INDICATOR 6.5 (GLOVE) ×1
GLOVE BIOGEL PI INDICATOR 7.0 (GLOVE) ×3
GLOVE BIOGEL PI INDICATOR 7.5 (GLOVE) ×5
GLOVE BIOGEL PI INDICATOR 8 (GLOVE) ×1
GLOVE SURG SS PI 6.5 STRL IVOR (GLOVE) ×2 IMPLANT
GOWN STRL REUS W/TWL LRG LVL3 (GOWN DISPOSABLE) ×6 IMPLANT
GOWN STRL REUS W/TWL XL LVL3 (GOWN DISPOSABLE) ×4 IMPLANT
HEAD M SROM 36MM PLUS 1.5 (Hips) ×1 IMPLANT
HOLDER FOLEY CATH W/STRAP (MISCELLANEOUS) ×2 IMPLANT
LINER MARATHON NEUT +4X54X36 (Hips) ×2 IMPLANT
MANIFOLD NEPTUNE II (INSTRUMENTS) ×2 IMPLANT
PACK ANTERIOR HIP CUSTOM (KITS) ×2 IMPLANT
SROM M HEAD 36MM PLUS 1.5 (Hips) ×2 IMPLANT
STEM FEMORAL SZ6 HIGH ACTIS (Stem) ×2 IMPLANT
STRIP CLOSURE SKIN 1/2X4 (GAUZE/BANDAGES/DRESSINGS) ×4 IMPLANT
SUT ETHIBOND NAB CT1 #1 30IN (SUTURE) ×2 IMPLANT
SUT MNCRL AB 4-0 PS2 18 (SUTURE) ×2 IMPLANT
SUT STRATAFIX 0 PDS 27 VIOLET (SUTURE) ×2
SUT VIC AB 2-0 CT1 27 (SUTURE) ×2
SUT VIC AB 2-0 CT1 TAPERPNT 27 (SUTURE) ×2 IMPLANT
SUTURE STRATFX 0 PDS 27 VIOLET (SUTURE) ×1 IMPLANT
SYR 50ML LL SCALE MARK (SYRINGE) IMPLANT
TRAY FOLEY MTR SLVR 16FR STAT (SET/KITS/TRAYS/PACK) ×2 IMPLANT
YANKAUER SUCT BULB TIP 10FT TU (MISCELLANEOUS) ×2 IMPLANT

## 2018-05-27 NOTE — Transfer of Care (Signed)
Immediate Anesthesia Transfer of Care Note  Patient: Cristian Moore  Procedure(s) Performed: RIGHT TOTAL HIP ARTHROPLASTY ANTERIOR APPROACH (Right Hip)  Patient Location: PACU  Anesthesia Type:MAC and Spinal  Level of Consciousness: awake, alert , oriented and patient cooperative  Airway & Oxygen Therapy: Patient Spontanous Breathing and Patient connected to face mask oxygen  Post-op Assessment: Report given to RN and Post -op Vital signs reviewed and stable  Post vital signs: Reviewed and stable  Last Vitals:  Vitals Value Taken Time  BP    Temp    Pulse    Resp    SpO2      Last Pain:  Vitals:   05/27/18 0600  TempSrc:   PainSc: 0-No pain      Patients Stated Pain Goal: 4 (26/83/41 [id number])  Complications: No apparent anesthesia complications

## 2018-05-27 NOTE — Op Note (Signed)
OPERATIVE REPORT- TOTAL HIP ARTHROPLASTY   PREOPERATIVE DIAGNOSIS: Osteoarthritis of the Right hip.   POSTOPERATIVE DIAGNOSIS: Osteoarthritis of the Right  hip.   PROCEDURE: Right total hip arthroplasty, anterior approach.   SURGEON: [name], MD   ASSISTANT: Griffith Citron, PA-C  ANESTHESIA:  Spinal  ESTIMATED BLOOD LOSS:-400 mL    DRAINS: Hemovac x1.   COMPLICATIONS: None   CONDITION: PACU - hemodynamically stable.   BRIEF CLINICAL NOTE: NETANEL YANNUZZI is a 77 y.o. male who has advanced end-  stage arthritis of their Right  hip with progressively worsening pain and  dysfunction.The patient has failed nonoperative management and presents for  total hip arthroplasty.   PROCEDURE IN DETAIL: After successful administration of spinal  anesthetic, the traction boots for the [facility] bed were placed on both  feet and the patient was placed onto the [facility] bed, boots placed into the leg  holders. The Right hip was then isolated from the perineum with plastic  drapes and prepped and draped in the usual sterile fashion. ASIS and  greater trochanter were marked and a oblique incision was made, starting  at about 1 cm lateral and 2 cm distal to the ASIS and coursing towards  the anterior cortex of the femur. The skin was cut with a 10 blade  through subcutaneous tissue to the level of the fascia overlying the  tensor fascia lata muscle. The fascia was then incised in line with the  incision at the junction of the anterior third and posterior 2/3rd. The  muscle was teased off the fascia and then the interval between the TFL  and the rectus was developed. The Hohmann retractor was then placed at  the top of the femoral neck over the capsule. The vessels overlying the  capsule were cauterized and the fat on top of the capsule was removed.  A Hohmann retractor was then placed anterior underneath the rectus  femoris to give exposure to the entire anterior capsule. A T-shaped   capsulotomy was performed. The edges were tagged and the femoral head  was identified.       Osteophytes are removed off the superior acetabulum.  The femoral neck was then cut in situ with an oscillating saw. Traction  was then applied to the left lower extremity utilizing the [facility]  traction. The femoral head was then removed. Retractors were placed  around the acetabulum and then circumferential removal of the labrum was  performed. Osteophytes were also removed. Reaming starts at 49 mm to  medialize and  Increased in 2 mm increments to 53 mm. We reamed in  approximately 40 degrees of abduction, 20 degrees anteversion. A 54 mm  pinnacle acetabular shell was then impacted in anatomic position under  fluoroscopic guidance with excellent purchase. We did not need to place  any additional dome screws. A 36 mm neutral + 4 marathon liner was then  placed into the acetabular shell.       The femoral lift was then placed along the lateral aspect of the femur  just distal to the vastus ridge. The leg was  externally rotated and capsule  was stripped off the inferior aspect of the femoral neck down to the  level of the lesser trochanter, this was done with electrocautery. The femur was lifted after this was performed. The  leg was then placed in an extended and adducted position essentially delivering the femur. We also removed the capsule superiorly and the piriformis from the piriformis  fossa to gain excellent exposure of the  proximal femur. Rongeur was used to remove some cancellous bone to get  into the lateral portion of the proximal femur for placement of the  initial starter reamer. The starter broaches was placed  the starter broach  and was shown to go down the center of the canal. Broaching  with the Actis system was then performed starting at size 0  coursing  Up to size 6. A size 6 had excellent torsional and rotational  and axial stability. The trial high offset neck was then placed   with a 36 + 1.5 trial head. The hip was then reduced. We confirmed that  the stem was in the canal both on AP and lateral x-rays. It also has excellent sizing. The hip was reduced with outstanding stability through full extension and full external rotation.. AP pelvis was taken and the leg lengths were measured and found to be equal. Hip was then dislocated again and the femoral head and neck removed. The  femoral broach was removed. Size 6 Actis stem with a high offset  neck was then impacted into the femur following native anteversion. Has  excellent purchase in the canal. Excellent torsional and rotational and  axial stability. It is confirmed to be in the canal on AP and lateral  fluoroscopic views. The 36 + 1.5 metal head was placed and the hip  reduced with outstanding stability. Again AP pelvis was taken and it  confirmed that the leg lengths were equal. The wound was then copiously  irrigated with saline solution and the capsule reattached and repaired  with Ethibond suture. 30 ml of .25% Bupivicaine was  injected into the capsule and into the edge of the tensor fascia lata as well as subcutaneous tissue. The fascia overlying the tensor fascia lata was then closed with a running #1 V-Loc. Subcu was closed with interrupted 2-0 Vicryl and subcuticular running 4-0 Monocryl. Incision was cleaned  and dried. Steri-Strips and a bulky sterile dressing applied. Hemovac  drain was hooked to suction and then the patient was awakened and transported to  recovery in stable condition.        Please note that a surgical assistant was a medical necessity for this procedure to perform it in a safe and expeditious manner. Assistant was necessary to provide appropriate retraction of vital neurovascular structures and to prevent femoral fracture and allow for anatomic placement of the prosthesis.  [name], M.D.

## 2018-05-27 NOTE — Anesthesia Procedure Notes (Signed)
Spinal  Patient location during procedure: OR Start time: 05/27/2018 7:08 AM End time: 05/27/2018 7:18 AM Staffing Anesthesiologist: Albertha Ghee, MD Performed: anesthesiologist  Preanesthetic Checklist Completed: patient identified, surgical consent, pre-op evaluation, timeout performed, IV checked, risks and benefits discussed and monitors and equipment checked Spinal Block Patient position: sitting Prep: DuraPrep Patient monitoring: cardiac monitor, continuous pulse ox and blood pressure Approach: left paramedian Location: L3-4 Injection technique: single-shot Needle Needle type: Spinocan  Needle gauge: 22 G Needle length: 9 cm Assessment Sensory level: T10 Additional Notes Functioning IV was confirmed and monitors were applied. Sterile prep and drape, including hand hygiene and sterile gloves were used. The patient was positioned and the spine was prepped. The skin was anesthetized with lidocaine.  Free flow of clear CSF was obtained prior to injecting local anesthetic into the CSF.  The spinal needle aspirated freely following injection.  The needle was carefully withdrawn.  The patient tolerated the procedure well.

## 2018-05-27 NOTE — Discharge Instructions (Signed)
°Dr. Frank Aluisio °Total Joint Specialist °[facility] °[address] °[place], [place] [address] °[phone] ° °ANTERIOR APPROACH TOTAL HIP REPLACEMENT POSTOPERATIVE DIRECTIONS ° ° °Hip Rehabilitation, Guidelines Following Surgery  °The results of a hip operation are greatly improved after range of motion and muscle strengthening exercises. Follow all safety measures which are given to protect your hip. If any of these exercises cause increased pain or swelling in your joint, decrease the amount until you are comfortable again. Then slowly increase the exercises. Call your caregiver if you have problems or questions.  ° °HOME CARE INSTRUCTIONS  °• Remove items at home which could result in a fall. This includes throw rugs or furniture in walking pathways.  °· ICE to the affected hip every three hours for 30 minutes at a time and then as needed for pain and swelling.  Continue to use ice on the hip for pain and swelling from surgery. You may notice swelling that will progress down to the foot and ankle.  This is normal after surgery.  Elevate the leg when you are not up walking on it.   °· Continue to use the breathing machine which will help keep your temperature down.  It is common for your temperature to cycle up and down following surgery, especially at night when you are not up moving around and exerting yourself.  The breathing machine keeps your lungs expanded and your temperature down. ° °DIET °You may resume your previous home diet once your are discharged from the hospital. ° °DRESSING / WOUND CARE / SHOWERING °You may shower 3 days after surgery, but keep the wounds dry during showering.  You may use an occlusive plastic wrap (Press'n Seal for example), NO SOAKING/SUBMERGING IN THE BATHTUB.  If the bandage gets wet, change with a clean dry gauze.  If the incision gets wet, pat the wound dry with a clean towel. °You may start showering once you are discharged home but do not submerge the  incision under water. Just pat the incision dry and apply a dry gauze dressing on daily. °Change the surgical dressing daily and reapply a dry dressing each time. ° °ACTIVITY °Walk with your walker as instructed. °Use walker as long as suggested by your caregivers. °Avoid periods of inactivity such as sitting longer than an hour when not asleep. This helps prevent blood clots.  °You may resume a sexual relationship in one month or when given the OK by your doctor.  °You may return to work once you are cleared by your doctor.  °Do not drive a car for 6 weeks or until released by you surgeon.  °Do not drive while taking narcotics. ° °WEIGHT BEARING °Weight bearing as tolerated with assist device (walker, cane, etc) as directed, use it as long as suggested by your surgeon or therapist, typically at least 4-6 weeks. ° °POSTOPERATIVE CONSTIPATION PROTOCOL °Constipation - defined medically as fewer than three stools per week and severe constipation as less than one stool per week. ° °One of the most common issues patients have following surgery is constipation.  Even if you have a regular bowel pattern at home, your normal regimen is likely to be disrupted due to multiple reasons following surgery.  Combination of anesthesia, postoperative narcotics, change in appetite and fluid intake all can affect your bowels.  In order to avoid complications following surgery, here are some recommendations in order to help you during your recovery period. ° °Colace (docusate) - Pick up an over-the-counter form   of Colace or another stool softener and take twice a day as long as you are requiring postoperative pain medications.  Take with a full glass of water daily.  If you experience loose stools or diarrhea, hold the colace until you stool forms back up.  If your symptoms do not get better within 1 week or if they get worse, check with your doctor. ° °Dulcolax (bisacodyl) - Pick up over-the-counter and take as directed by the product  packaging as needed to assist with the movement of your bowels.  Take with a full glass of water.  Use this product as needed if not relieved by Colace only.  ° °MiraLax (polyethylene glycol) - Pick up over-the-counter to have on hand.  MiraLax is a solution that will increase the amount of water in your bowels to assist with bowel movements.  Take as directed and can mix with a glass of water, juice, soda, coffee, or tea.  Take if you go more than two days without a movement. °Do not use MiraLax more than once per day. Call your doctor if you are still constipated or irregular after using this medication for 7 days in a row. ° °If you continue to have problems with postoperative constipation, please contact the office for further assistance and recommendations.  If you experience "the worst abdominal pain ever" or develop nausea or vomiting, please contact the office immediatly for further recommendations for treatment. ° °ITCHING ° If you experience itching with your medications, try taking only a single pain pill, or even half a pain pill at a time.  You can also use Benadryl over the counter for itching or also to help with sleep.  ° °TED HOSE STOCKINGS °Wear the elastic stockings on both legs for three weeks following surgery during the day but you may remove then at night for sleeping. ° °MEDICATIONS °See your medication summary on the “After Visit Summary” that the nursing staff will review with you prior to discharge.  You may have some home medications which will be placed on hold until you complete the course of blood thinner medication.  It is important for you to complete the blood thinner medication as prescribed by your surgeon.  Continue your approved medications as instructed at time of discharge. ° °PRECAUTIONS °If you experience chest pain or shortness of breath - call 911 immediately for transfer to the hospital emergency department.  °If you develop a fever greater that 101 F, purulent drainage  from wound, increased redness or drainage from wound, foul odor from the wound/dressing, or calf pain - CONTACT YOUR SURGEON.   °                                                °FOLLOW-UP APPOINTMENTS °Make sure you keep all of your appointments after your operation with your surgeon and caregivers. You should call the office at the above phone number and make an appointment for approximately two weeks after the date of your surgery or on the date instructed by your surgeon outlined in the "After Visit Summary". ° °RANGE OF MOTION AND STRENGTHENING EXERCISES  °These exercises are designed to help you keep full movement of your hip joint. Follow your caregiver's or physical therapist's instructions. Perform all exercises about fifteen times, three times per day or as directed. Exercise both hips, even if you have   had only one joint replacement. These exercises can be done on a training (exercise) mat, on the floor, on a table or on a bed. Use whatever works the best and is most comfortable for you. Use music or television while you are exercising so that the exercises are a pleasant break in your day. This will make your life better with the exercises acting as a break in routine you can look forward to.  °• Lying on your back, slowly slide your foot toward your buttocks, raising your knee up off the floor. Then slowly slide your foot back down until your leg is straight again.  °• Lying on your back spread your legs as far apart as you can without causing discomfort.  °• Lying on your side, raise your upper leg and foot straight up from the floor as far as is comfortable. Slowly lower the leg and repeat.  °• Lying on your back, tighten up the muscle in the front of your thigh (quadriceps muscles). You can do this by keeping your leg straight and trying to raise your heel off the floor. This helps strengthen the largest muscle supporting your knee.  °• Lying on your back, tighten up the muscles of your buttocks both  with the legs straight and with the knee bent at a comfortable angle while keeping your heel on the floor.  ° °IF YOU ARE TRANSFERRED TO A SKILLED REHAB FACILITY °If the patient is transferred to a skilled rehab facility following release from the hospital, a list of the current medications will be sent to the facility for the patient to continue.  When discharged from the skilled rehab facility, please have the facility set up the patient's Home Health Physical Therapy prior to being released. Also, the skilled facility will be responsible for providing the patient with their medications at time of release from the facility to include their pain medication, the muscle relaxants, and their blood thinner medication. If the patient is still at the rehab facility at time of the two week follow up appointment, the skilled rehab facility will also need to assist the patient in arranging follow up appointment in our office and any transportation needs. ° °MAKE SURE YOU:  °• Understand these instructions.  °• Get help right away if you are not doing well or get worse.  ° ° °Pick up stool softner and laxative for home use following surgery while on pain medications. °Do not submerge incision under water. °Please use good hand washing techniques while changing dressing each day. °May shower starting three days after surgery. °Please use a clean towel to pat the incision dry following showers. °Continue to use ice for pain and swelling after surgery. °Do not use any lotions or creams on the incision until instructed by your surgeon. ° °

## 2018-05-27 NOTE — Anesthesia Preprocedure Evaluation (Signed)
Anesthesia Evaluation  Patient identified by MRN, date of birth, ID band Patient awake    Reviewed: Allergy & Precautions, H&P , NPO status , Patient's Chart, lab work & pertinent test results  Airway Mallampati: II   Neck ROM: full    Dental   Pulmonary shortness of breath, asthma , sleep apnea , COPD, former smoker,    breath sounds clear to auscultation       Cardiovascular hypertension,  Rhythm:regular Rate:Normal     Neuro/Psych    GI/Hepatic hiatal hernia, GERD  ,  Endo/Other  diabetes, Type 2obese  Renal/GU      Musculoskeletal  (+) Arthritis ,   Abdominal   Peds  Hematology   Anesthesia Other Findings   Reproductive/Obstetrics                             Anesthesia Physical Anesthesia Plan  ASA: III  Anesthesia Plan: MAC and Spinal   Post-op Pain Management:    Induction: Intravenous  PONV Risk Score and Plan: 1 and Ondansetron, Propofol infusion, Midazolam and Treatment may vary due to age or medical condition  Airway Management Planned: Simple Face Mask  Additional Equipment:   Intra-op Plan:   Post-operative Plan:   Informed Consent: I have reviewed the patients History and Physical, chart, labs and discussed the procedure including the risks, benefits and alternatives for the proposed anesthesia with the patient or authorized representative who has indicated his/her understanding and acceptance.     Plan Discussed with: CRNA, Anesthesiologist and Surgeon  Anesthesia Plan Comments:         Anesthesia Quick Evaluation

## 2018-05-27 NOTE — Evaluation (Signed)
Physical Therapy Evaluation Patient Details Name: Cristian Moore MRN: [id number] DOB: [birth year] Today's Date: 05/27/2018   History of Present Illness  Pt s/p R THR and with hx of DM and OA   Clinical Impression  Pt s/p R THR and presents with decreased R LE strength/ROM, post op pain, and obesity limiting functional mobility.  Pt should progress to dc home with family assist.    Follow Up Recommendations Home health PT;Follow surgeon's recommendation for DC plan and follow-up therapies    Equipment Recommendations  None recommended by PT    Recommendations for Other Services       Precautions / Restrictions Precautions Precautions: Fall Restrictions Weight Bearing Restrictions: No Other Position/Activity Restrictions: WBAT      Mobility  Bed Mobility Overal bed mobility: Needs Assistance Bed Mobility: Supine to Sit     Supine to sit: Min assist;Mod assist;+2 for physical assistance;+2 for safety/equipment     General bed mobility comments: cues for sequence and use of L LE to self assist.  Physical assist to manage R LE and to bring trunk to upright  Transfers Overall transfer level: Needs assistance Equipment used: Rolling walker (2 wheeled) Transfers: Sit to/from Stand Sit to Stand: Min assist;Mod assist;+2 physical assistance;+2 safety/equipment         General transfer comment: cues for LE management and use of UEs to self assist.  Physical assist to bring wt up and fwd and to balance in initial standing  Ambulation/Gait Ambulation/Gait assistance: Min assist;+2 safety/equipment Gait Distance (Feet): 48 Feet Assistive device: Rolling walker (2 wheeled) Gait Pattern/deviations: Step-to pattern;Decreased step length - right;Shuffle;Trunk flexed;Antalgic Gait velocity: decr   General Gait Details: cues for sequence, posture and position from [employer]            Wheelchair Mobility    Modified Rankin (Stroke Patients Only)       Balance  Overall balance assessment: Needs assistance Sitting-balance support: No upper extremity supported;Feet supported Sitting balance-Leahy Scale: Good     Standing balance support: Bilateral upper extremity supported Standing balance-Leahy Scale: Poor                               Pertinent Vitals/Pain Pain Assessment: 0-10 Pain Score: 4  Pain Location: R hip Pain Descriptors / Indicators: Aching;Sore Pain Intervention(s): Limited activity within patient's tolerance;Monitored during session;Premedicated before session;Ice applied    Home Living Family/patient expects to be discharged to:: Private residence Living Arrangements: Spouse/significant other Available Help at Discharge: Family Type of Home: House Home Access: Stairs to enter Entrance Stairs-Rails: None Entrance Stairs-Number of Steps: 3 Home Layout: Able to live on main level with bedroom/bathroom Home Equipment: Walker - 2 wheels;Walker - 4 wheels;Cane - single point;Crutches;Bedside commode      Prior Function Level of Independence: Independent;Independent with assistive device(s)               Hand Dominance        Extremity/Trunk Assessment   Upper Extremity Assessment Upper Extremity Assessment: Overall WFL for tasks assessed    Lower Extremity Assessment Lower Extremity Assessment: RLE deficits/detail       Communication   Communication: No difficulties  Cognition Arousal/Alertness: Awake/alert Behavior During Therapy: WFL for tasks assessed/performed Overall Cognitive Status: Within Functional Limits for tasks assessed  General Comments      Exercises Total Joint Exercises Ankle Circles/Pumps: AROM;Both;15 reps;Supine   Assessment/Plan    PT Assessment Patient needs continued PT services  PT Problem List Decreased strength;Decreased range of motion;Decreased activity tolerance;Decreased balance;Decreased  mobility;Decreased knowledge of use of DME;Obesity;Pain       PT Treatment Interventions DME instruction;Gait training;Stair training;Functional mobility training;Therapeutic activities;Therapeutic exercise;Patient/family education    PT Goals (Current goals can be found in the Care Plan section)  Acute Rehab PT Goals Patient Stated Goal: Regain IND PT Goal Formulation: With patient Time For Goal Achievement: 06/03/18 Potential to Achieve Goals: Good    Frequency 7X/week   Barriers to discharge        Co-evaluation               AM-PAC PT "6 Clicks" Mobility  Outcome Measure Help needed turning from your back to your side while in a flat bed without using bedrails?: A Lot Help needed moving from lying on your back to sitting on the side of a flat bed without using bedrails?: A Lot Help needed moving to and from a bed to a chair (including a wheelchair)?: A Lot Help needed standing up from a chair using your arms (e.g., wheelchair or bedside chair)?: A Lot Help needed to walk in hospital room?: A Lot Help needed climbing 3-5 steps with a railing? : A Lot 6 Click Score: 12    End of Session Equipment Utilized During Treatment: Gait belt Activity Tolerance: Patient tolerated treatment well Patient left: in chair;with call bell/phone within reach;with family/visitor present Nurse Communication: Mobility status PT Visit Diagnosis: Difficulty in walking, not elsewhere classified (R26.2)    Time: 1540-1610 PT Time Calculation (min) (ACUTE ONLY): 30 min   Charges:   PT Evaluation $PT Eval Low Complexity: 1 Low PT Treatments $Gait Training: 8-22 mins        [place] Pager [phone] Office [phone]   Omaira Mellen 05/27/2018, 4:27 PM

## 2018-05-27 NOTE — Interval H&P Note (Signed)
History and Physical Interval Note:  05/27/2018 6:37 AM  Cristian Moore  has presented today for surgery, with the diagnosis of right hip osteoarthritis  The various methods of treatment have been discussed with the patient and family. After consideration of risks, benefits and other options for treatment, the patient has consented to  Procedure(s) with comments: [place] (Right) - 129min as a surgical intervention .  The patient's history has been reviewed, patient examined, no change in status, stable for surgery.  I have reviewed the patient's chart and labs.  Questions were answered to the patient's satisfaction.     [name]

## 2018-05-27 NOTE — Anesthesia Postprocedure Evaluation (Signed)
Anesthesia Post Note  Patient: Cristian Moore  Procedure(s) Performed: RIGHT TOTAL HIP ARTHROPLASTY ANTERIOR APPROACH (Right Hip)     Patient location during evaluation: PACU Anesthesia Type: MAC and Spinal Level of consciousness: oriented and awake and alert Pain management: pain level controlled Vital Signs Assessment: post-procedure vital signs reviewed and stable Respiratory status: spontaneous breathing, respiratory function stable and patient connected to nasal cannula oxygen Cardiovascular status: blood pressure returned to baseline and stable Postop Assessment: no headache, no backache and no apparent nausea or vomiting Anesthetic complications: no    Last Vitals:  Vitals:   05/27/18 1208 05/27/18 1309  BP: 121/63 140/70  Pulse: (!) 57 63  Resp:  14  Temp: (!) 36.4 C   SpO2: 97% 99%    Last Pain:  Vitals:   05/27/18 1208  TempSrc: Oral  PainSc:                  [place]

## 2018-05-28 LAB — CBC
HCT: 36.2 % — ABNORMAL LOW (ref 39.0–52.0)
Hemoglobin: 11.4 g/dL — ABNORMAL LOW (ref 13.0–17.0)
MCH: 31.5 pg (ref 26.0–34.0)
MCHC: 31.5 g/dL (ref 30.0–36.0)
MCV: 100 fL (ref 80.0–100.0)
NRBC: 0 % (ref 0.0–0.2)
PLATELETS: 265 10*3/uL (ref 150–400)
RBC: 3.62 MIL/uL — ABNORMAL LOW (ref 4.22–5.81)
RDW: 13.4 % (ref 11.5–15.5)
WBC: 12.1 10*3/uL — AB (ref 4.0–10.5)

## 2018-05-28 LAB — GLUCOSE, CAPILLARY
Glucose-Capillary: 112 mg/dL — ABNORMAL HIGH (ref 70–99)
Glucose-Capillary: 132 mg/dL — ABNORMAL HIGH (ref 70–99)

## 2018-05-28 LAB — BASIC METABOLIC PANEL
ANION GAP: 5 (ref 5–15)
BUN: 14 mg/dL (ref 8–23)
CO2: 25 mmol/L (ref 22–32)
Calcium: 8.8 mg/dL — ABNORMAL LOW (ref 8.9–10.3)
Chloride: 108 mmol/L (ref 98–111)
Creatinine, Ser: 0.91 mg/dL (ref 0.61–1.24)
GFR calc Af Amer: >60 mL/min (ref >60)
GLUCOSE: 152 mg/dL — AB (ref 70–99)
Potassium: 4.5 mmol/L (ref 3.5–5.1)
SODIUM: 138 mmol/L (ref 135–145)

## 2018-05-28 NOTE — Progress Notes (Signed)
Physical Therapy Treatment Patient Details Name: Cristian Moore MRN: [id number] DOB: April 19, 1941 Today's Date: 05/28/2018    History of Present Illness Pt s/p R THR and with hx of DM and OA     PT Comments    Improvement in mobility but continues to require assist for all tasks.  Will follow up in pm and progress to stair training as tolerated.  Follow Up Recommendations  Home health PT;Follow surgeon's recommendation for DC plan and follow-up therapies     Equipment Recommendations  None recommended by PT    Recommendations for Other Services       Precautions / Restrictions Precautions Precautions: Fall Restrictions Weight Bearing Restrictions: No Other Position/Activity Restrictions: WBAT    Mobility  Bed Mobility Overal bed mobility: Needs Assistance Bed Mobility: Supine to Sit     Supine to sit: Min assist     General bed mobility comments: Increased time with cues for sequence and use of L LE to self assist.  Physical assist to manage R LE and to bring trunk to upright  Transfers Overall transfer level: Needs assistance Equipment used: Rolling walker (2 wheeled) Transfers: Sit to/from Stand Sit to Stand: Min assist         General transfer comment: cues for LE management and use of UEs to self assist.  Physical assist to bring wt up and fwd and to balance in initial standing  Ambulation/Gait Ambulation/Gait assistance: Min assist;Min guard Gait Distance (Feet): 140 Feet Assistive device: Rolling walker (2 wheeled) Gait Pattern/deviations: Step-to pattern;Decreased step length - right;Shuffle;Trunk flexed;Antalgic Gait velocity: decr   General Gait Details: cues for sequence, posture and position from [employer]             Wheelchair Mobility    Modified Rankin (Stroke Patients Only)       Balance Overall balance assessment: Needs assistance Sitting-balance support: No upper extremity supported;Feet supported Sitting  balance-Leahy Scale: Good     Standing balance support: Bilateral upper extremity supported Standing balance-Leahy Scale: Fair                              Cognition Arousal/Alertness: Awake/alert Behavior During Therapy: WFL for tasks assessed/performed Overall Cognitive Status: Within Functional Limits for tasks assessed                                        Exercises Total Joint Exercises Ankle Circles/Pumps: AROM;Both;15 reps;Supine Quad Sets: AROM;10 reps;Supine;Right Heel Slides: AAROM;Right;Supine;20 reps Hip ABduction/ADduction: AAROM;Right;15 reps;Supine    General Comments        Pertinent Vitals/Pain Pain Assessment: 0-10 Pain Score: 4  Pain Location: R hip Pain Descriptors / Indicators: Aching;Sore Pain Intervention(s): Limited activity within patient's tolerance;Monitored during session;Premedicated before session;Ice applied    Home Living                      Prior Function            PT Goals (current goals can now be found in the care plan section) Acute Rehab PT Goals Patient Stated Goal: Regain IND PT Goal Formulation: With patient Time For Goal Achievement: 06/03/18 Potential to Achieve Goals: Good Progress towards PT goals: Progressing toward goals    Frequency    7X/week      PT Plan Current plan remains appropriate  Co-evaluation              AM-PAC PT "6 Clicks" Mobility   Outcome Measure  Help needed turning from your back to your side while in a flat bed without using bedrails?: A Lot Help needed moving from lying on your back to sitting on the side of a flat bed without using bedrails?: A Lot Help needed moving to and from a bed to a chair (including a wheelchair)?: A Lot Help needed standing up from a chair using your arms (e.g., wheelchair or bedside chair)?: A Little Help needed to walk in hospital room?: A Little Help needed climbing 3-5 steps with a railing? : A Lot 6 Click  Score: 14    End of Session Equipment Utilized During Treatment: Gait belt Activity Tolerance: Patient tolerated treatment well Patient left: in chair;with call bell/phone within reach;with family/visitor present Nurse Communication: Mobility status PT Visit Diagnosis: Difficulty in walking, not elsewhere classified (R26.2)     Time: 1761-6073 PT Time Calculation (min) (ACUTE ONLY): 40 min  Charges:  $Gait Training: 8-22 mins $Therapeutic Exercise: 8-22 mins $Therapeutic Activity: 8-22 mins                     Debe Coder PT Acute Rehabilitation Services Pager [phone] Office [phone]    Ellory Khurana 05/28/2018, 12:36 PM

## 2018-05-28 NOTE — Progress Notes (Signed)
[facility] informed that pt is discharging today. Alvis Lemmings will follow up with pt 24 to 48 hours after discharge.

## 2018-05-28 NOTE — Progress Notes (Signed)
Physical Therapy Treatment Patient Details Name: Cristian Moore MRN: [id number] DOB: 12-21-1940 Today's Date: 05/28/2018    History of Present Illness Pt s/p R THR and with hx of DM and OA     PT Comments    Pt with marked improvement in activity tolerance, pain control and stability with ambulation vs this am.  Pt and spouse reviewed car transfers, lower body dressing, stairs and home therex program with written instruction provided.   Follow Up Recommendations  Home health PT;Follow surgeon's recommendation for DC plan and follow-up therapies     Equipment Recommendations  None recommended by PT    Recommendations for Other Services       Precautions / Restrictions Precautions Precautions: Fall Restrictions Weight Bearing Restrictions: No Other Position/Activity Restrictions: WBAT    Mobility  Bed Mobility Overal bed mobility: Needs Assistance Bed Mobility: Supine to Sit     Supine to sit: Min assist     General bed mobility comments: NT - pt up in chair and requests back to same  Transfers Overall transfer level: Needs assistance Equipment used: Rolling walker (2 wheeled) Transfers: Sit to/from Stand Sit to Stand: Supervision         General transfer comment: min cues for LE management and use of UEs to self assist.  Ambulation/Gait Ambulation/Gait assistance: Min guard;Supervision Gait Distance (Feet): 120 Feet Assistive device: Rolling walker (2 wheeled) Gait Pattern/deviations: Step-to pattern;Decreased step length - right;Shuffle;Trunk flexed;Antalgic Gait velocity: decr   General Gait Details: cues for sequence, posture and position from RW   Stairs Stairs: Yes Stairs assistance: Min assist Stair Management: No rails;Step to pattern;Backwards;With walker Number of Stairs: 4 General stair comments: 2 steps twice bkwd with RW and cues for sequence and foot/RW placement.  Spouse present and written instruction provided.  Pt states he will have  a son come to assist into home   Wheelchair Mobility    Modified Rankin (Stroke Patients Only)       Balance Overall balance assessment: Needs assistance Sitting-balance support: No upper extremity supported;Feet supported Sitting balance-Leahy Scale: Good     Standing balance support: Bilateral upper extremity supported Standing balance-Leahy Scale: Fair                              Cognition Arousal/Alertness: Awake/alert Behavior During Therapy: WFL for tasks assessed/performed Overall Cognitive Status: Within Functional Limits for tasks assessed                                        Exercises Total Joint Exercises Ankle Circles/Pumps: AROM;Both;15 reps;Supine Quad Sets: AROM;10 reps;Supine;Right Heel Slides: AAROM;Right;Supine;20 reps Hip ABduction/ADduction: AAROM;Right;15 reps;Supine Long Arc Quad: AAROM;AROM;Right;10 reps;Seated    General Comments        Pertinent Vitals/Pain Pain Assessment: 0-10 Pain Score: 3  Pain Location: R hip Pain Descriptors / Indicators: Aching;Sore Pain Intervention(s): Limited activity within patient's tolerance;Monitored during session;Premedicated before session;Ice applied    Home Living                      Prior Function            PT Goals (current goals can now be found in the care plan section) Acute Rehab PT Goals Patient Stated Goal: Regain IND PT Goal Formulation: With patient Time For Goal Achievement: 06/03/18 Potential to Achieve  Goals: Good Progress towards PT goals: Progressing toward goals    Frequency    7X/week      PT Plan Current plan remains appropriate    Co-evaluation              AM-PAC PT "6 Clicks" Mobility   Outcome Measure  Help needed turning from your back to your side while in a flat bed without using bedrails?: A Little Help needed moving from lying on your back to sitting on the side of a flat bed without using bedrails?: A  Little Help needed moving to and from a bed to a chair (including a wheelchair)?: A Little Help needed standing up from a chair using your arms (e.g., wheelchair or bedside chair)?: A Little Help needed to walk in hospital room?: A Little Help needed climbing 3-5 steps with a railing? : A Little 6 Click Score: 18    End of Session Equipment Utilized During Treatment: Gait belt Activity Tolerance: Patient tolerated treatment well Patient left: in chair;with call bell/phone within reach;with family/visitor present Nurse Communication: Mobility status PT Visit Diagnosis: Difficulty in walking, not elsewhere classified (R26.2)     Time: 1400-1440 PT Time Calculation (min) (ACUTE ONLY): 40 min  Charges:  $Gait Training: 8-22 mins $Therapeutic Exercise: 8-22 mins $Therapeutic Activity: 8-22 mins                     Debe Coder PT Acute Rehabilitation Services Pager [phone] Office [phone]    Cristian Moore 05/28/2018, 3:09 PM

## 2018-05-28 NOTE — Progress Notes (Signed)
   Subjective: 1 Day Post-Op Procedure(s) (LRB): RIGHT TOTAL HIP ARTHROPLASTY ANTERIOR APPROACH (Right) Patient reports pain as mild.   He feels ready to go home today Plan is to go Home after hospital stay.  Objective: Vital signs in last 24 hours: Temp:  [97.5 F (36.4 C)-98.4 F (36.9 C)] 98.2 F (36.8 C) (11/28 0545) Pulse Rate:  [51-67] 61 (11/28 0545) Resp:  [12-20] 18 (11/28 0545) BP: (104-145)/(50-70) 118/54 (11/28 0545) SpO2:  [90 %-100 %] 98 % (11/28 0545)  Intake/Output from previous day:  Intake/Output Summary (Last 24 hours) at 05/28/2018 0913 Last data filed at 05/28/2018 0900 Gross per 24 hour  Intake 4905.36 ml  Output 3155 ml  Net 1750.36 ml    Intake/Output this shift: Total I/O In: 240 [P.O.:240] Out: 175 [Urine:175]  Labs: Recent Labs    05/28/18 0513  HGB 11.4*   Recent Labs    05/28/18 0513  WBC 12.1*  RBC 3.62*  HCT 36.2*  PLT 265   Recent Labs    05/28/18 0513  NA 138  K 4.5  CL 108  CO2 25  BUN 14  CREATININE 0.91  GLUCOSE 152*  CALCIUM 8.8*   No results for input(s): LABPT, INR in the last 72 hours.  EXAM General - Patient is Alert, Appropriate and Oriented Extremity - Neurologically intact Neurovascular intact No cellulitis present Compartment soft Dressing - dressing C/D/I Motor Function - intact, moving foot and toes well on exam.  Hemovac pulled without difficulty.  Past Medical History:  Diagnosis Date  . Arthritis   . Asthma   . Benign prostatic hypertrophy   . Carotid stenosis    Carotid US (07/2013):  Bilateral 1-39% ICA (f/u 2 yrs)  . Chest pain    a. 01/2013 Cath: LM nl, LAD 25p, D1 25p, LCX nl, RCA 25p, PDA nl, RPL nl, EF 55%;  b. 03/2015 MV: EF 59%, no ischemia; c. 03/2016 MV: basal inf/mid inf defect, likely artifact, EF 48%, low risk.  . Chronic Dyspnea on exertion    a. See cardiac eval under CAD heading;  b. 03/2015 Echo: EF 50-55%, gr1 DD, triv AI;  b. 03/2015 CTA Chest: No PE;  c. 05/2016 CPX: no  significant cardi-pulmonary limitation noted. Suspect obesity contributing to exertional symptoms.  . Diabetes mellitus without complication ([place]) 8657  . Difficulty urinating   . Diverticulitis   . Diverticulosis   . Exogenous obesity   . GERD (gastroesophageal reflux disease)   . H/O hiatal hernia   . Hernia    Umbulical   . Hyperlipidemia   . Laceration  01/11/2009    Deep laceration left index finger dorsally  . Musculoskeletal pain    in the right shoulder - S/P ROTATOR CUFF REPAIR-BUT STILL HAS PAIN  . Obstructive sleep apnea     CPAP    AUTO SET 6 TO 20 - USUALLY SETTLES OUT AT 10  . Rectal bleeding    PT ATTRIBUTES TO HEMORRHOIDS  . SUPRAVENTRICULAR TACHYCARDIA 08/01/2009   Qualifier: Diagnosis of  By: Lovette Cliche, CNA, Christy    . Thyroiditis   . Ulcer 2008  . Walking pneumonia     Assessment/Plan: 1 Day Post-Op Procedure(s) (LRB): RIGHT TOTAL HIP ARTHROPLASTY ANTERIOR APPROACH (Right) Principal Problem:   OA (osteoarthritis) of hip   Advance diet Up with therapy D/C IV fluids  D/C home after PT  DVT Prophylaxis - Aspirin Weight Bearing As Tolerated right Leg Hemovac Pulled Begin Therapy  Cristian Moore

## 2018-05-28 NOTE — Progress Notes (Signed)
Patient discharged to home w/ spouse. Given all belongings, instructions. Prescriptions provided yesterday. Verbalized understanding of all instructions. Escorted to pov via w/c.

## 2018-05-29 ENCOUNTER — Encounter (HOSPITAL_COMMUNITY): Payer: Self-pay | Admitting: Orthopedic Surgery

## 2018-05-30 DIAGNOSIS — I119 Hypertensive heart disease without heart failure: Secondary | ICD-10-CM | POA: Diagnosis not present

## 2018-05-30 DIAGNOSIS — J45909 Unspecified asthma, uncomplicated: Secondary | ICD-10-CM | POA: Diagnosis not present

## 2018-05-30 DIAGNOSIS — E119 Type 2 diabetes mellitus without complications: Secondary | ICD-10-CM | POA: Diagnosis not present

## 2018-05-30 DIAGNOSIS — Z471 Aftercare following joint replacement surgery: Secondary | ICD-10-CM | POA: Diagnosis not present

## 2018-05-30 DIAGNOSIS — J439 Emphysema, unspecified: Secondary | ICD-10-CM | POA: Diagnosis not present

## 2018-06-01 NOTE — Discharge Summary (Signed)
Physician Discharge Summary   Patient ID: Cristian Moore MRN: [id number] DOB/AGE: 01-17-1941 77 y.o.  Admit date: 05/27/2018 Discharge date: 05/28/2018  Primary Diagnosis: Osteoarthritis, right hip   Admission Diagnoses:  Past Medical History:  Diagnosis Date  . Arthritis   . Asthma   . Benign prostatic hypertrophy   . Carotid stenosis    Carotid US (07/2013):  Bilateral 1-39% ICA (f/u 2 yrs)  . Chest pain    a. 01/2013 Cath: LM nl, LAD 25p, D1 25p, LCX nl, RCA 25p, PDA nl, RPL nl, EF 55%;  b. 03/2015 MV: EF 59%, no ischemia; c. 03/2016 MV: basal inf/mid inf defect, likely artifact, EF 48%, low risk.  . Chronic Dyspnea on exertion    a. See cardiac eval under CAD heading;  b. 03/2015 Echo: EF 50-55%, gr1 DD, triv AI;  b. 03/2015 CTA Chest: No PE;  c. 05/2016 CPX: no significant cardi-pulmonary limitation noted. Suspect obesity contributing to exertional symptoms.  . Diabetes mellitus without complication ([place]) 5993  . Difficulty urinating   . Diverticulitis   . Diverticulosis   . Exogenous obesity   . GERD (gastroesophageal reflux disease)   . H/O hiatal hernia   . Hernia    Umbulical   . Hyperlipidemia   . Laceration  01/11/2009    Deep laceration left index finger dorsally  . Musculoskeletal pain    in the right shoulder - S/P ROTATOR CUFF REPAIR-BUT STILL HAS PAIN  . Obstructive sleep apnea     CPAP    AUTO SET 6 TO 20 - USUALLY SETTLES OUT AT 10  . Rectal bleeding    PT ATTRIBUTES TO HEMORRHOIDS  . SUPRAVENTRICULAR TACHYCARDIA 08/01/2009   Qualifier: Diagnosis of  By: Lovette Cliche, CNA, Christy    . Thyroiditis   . Ulcer 2008  . Walking pneumonia    Discharge Diagnoses:   Principal Problem:   OA (osteoarthritis) of hip  Estimated body mass index is 34.83 kg/m as calculated from the following:   Height as of this encounter: _0  (1.854 m).   Weight as of this encounter: 119.7 kg.  Procedure:  Procedure(s) (LRB): RIGHT TOTAL HIP ARTHROPLASTY ANTERIOR APPROACH (Right)    Consults: None  HPI: Cristian Moore is a 77 y.o. male who has advanced end-stage arthritis of their Right  hip with progressively worsening pain and dysfunction.The patient has failed nonoperative management and presents for total hip arthroplasty.   Laboratory Data: Admission on 05/27/2018, Discharged on 05/28/2018  Component Date Value Ref Range Status  . Glucose-Capillary 05/27/2018 106* 70 - 99 mg/dL Final  . Glucose-Capillary 05/27/2018 112* 70 - 99 mg/dL Final  . Glucose-Capillary 05/27/2018 178* 70 - 99 mg/dL Final  . WBC 05/28/2018 12.1* 4.0 - 10.5 K/uL Final  . RBC 05/28/2018 3.62* 4.22 - 5.81 MIL/uL Final  . Hemoglobin 05/28/2018 11.4* 13.0 - 17.0 g/dL Final  . HCT 05/28/2018 36.2* 39.0 - 52.0 % Final  . MCV 05/28/2018 100.0  80.0 - 100.0 fL Final  . MCH 05/28/2018 31.5  26.0 - 34.0 pg Final  . MCHC 05/28/2018 31.5  30.0 - 36.0 g/dL Final  . RDW 05/28/2018 13.4  11.5 - 15.5 % Final  . Platelets 05/28/2018 265  150 - 400 K/uL Final  . nRBC 05/28/2018 0.0  0.0 - 0.2 % Final   Performed at [facility], [place] [address] [place], [place] [address]  . Sodium 05/28/2018 138  135 - 145 mmol/L Final  . Potassium 05/28/2018 4.5  3.5 - 5.1 mmol/L Final  . Chloride 05/28/2018 108  98 - 111 mmol/L Final  . CO2 05/28/2018 25  22 - 32 mmol/L Final  . Glucose, Bld 05/28/2018 152* 70 - 99 mg/dL Final  . BUN 05/28/2018 14  8 - 23 mg/dL Final  . Creatinine, Ser 05/28/2018 0.91  0.61 - 1.24 mg/dL Final  . Calcium 05/28/2018 8.8* 8.9 - 10.3 mg/dL Final  . GFR calc non Af Amer 05/28/2018 >60  >60 mL/min Final  . GFR calc Af Amer 05/28/2018 >60  >60 mL/min Final  . Anion gap 05/28/2018 5  5 - 15 Final   Performed at [facility], [place] [address], [place], [place] [address]  . Glucose-Capillary 05/27/2018 174* 70 - 99 mg/dL Final  . Glucose-Capillary 05/27/2018 135* 70 - 99 mg/dL Final  . Glucose-Capillary 05/28/2018 112* 70 - 99 mg/dL Final  .  Glucose-Capillary 05/28/2018 132* 70 - 99 mg/dL Final  Hospital Outpatient Visit on 05/19/2018  Component Date Value Ref Range Status  . aPTT 05/19/2018 24  24 - 36 seconds Final   Performed at [facility], [place] [address] [place], [place] [address]  . WBC 05/19/2018 8.9  4.0 - 10.5 K/uL Final  . RBC 05/19/2018 4.56  4.22 - 5.81 MIL/uL Final  . Hemoglobin 05/19/2018 14.3  13.0 - 17.0 g/dL Final  . HCT 05/19/2018 44.5  39.0 - 52.0 % Final  . MCV 05/19/2018 97.6  80.0 - 100.0 fL Final  . MCH 05/19/2018 31.4  26.0 - 34.0 pg Final  . MCHC 05/19/2018 32.1  30.0 - 36.0 g/dL Final  . RDW 05/19/2018 13.7  11.5 - 15.5 % Final  . Platelets 05/19/2018 189  150 - 400 K/uL Final  . nRBC 05/19/2018 0.0  0.0 - 0.2 % Final   Performed at [facility], [place] [address] [place], [place] [address]  . Sodium 05/19/2018 140  135 - 145 mmol/L Final  . Potassium 05/19/2018 4.5  3.5 - 5.1 mmol/L Final  . Chloride 05/19/2018 103  98 - 111 mmol/L Final  . CO2 05/19/2018 29  22 - 32 mmol/L Final  . Glucose, Bld 05/19/2018 104* 70 - 99 mg/dL Final  . BUN 05/19/2018 17  8 - 23 mg/dL Final  . Creatinine, Ser 05/19/2018 1.09  0.61 - 1.24 mg/dL Final  . Calcium 05/19/2018 9.5  8.9 - 10.3 mg/dL Final  . Total Protein 05/19/2018 6.8  6.5 - 8.1 g/dL Final  . Albumin 05/19/2018 4.0  3.5 - 5.0 g/dL Final  . AST 05/19/2018 22  15 - 41 U/L Final  . ALT 05/19/2018 22  0 - 44 U/L Final  . Alkaline Phosphatase 05/19/2018 35* 38 - 126 U/L Final  . Total Bilirubin 05/19/2018 0.8  0.3 - 1.2 mg/dL Final  . GFR calc non Af Amer 05/19/2018 >60  >60 mL/min Final  . GFR calc Af Amer 05/19/2018 >60  >60 mL/min Final   Comment: (NOTE) The eGFR has been calculated using the CKD EPI equation. This calculation has not been validated in all clinical situations. eGFR's persistently <60 mL/min signify possible Chronic Kidney Disease.   Georgiann Hahn gap 05/19/2018 8  5 - 15 Final   Performed at [facility], [place] [address], [place], [place] [address]  . Prothrombin Time 05/19/2018 11.8  11.4 - 15.2 seconds Final  . INR 05/19/2018 0.87   Final   Performed at [facility], [place]  [address], [place], [place] [address]  . ABO/RH(D) 05/19/2018 O POS   Final  . Antibody Screen 05/19/2018 NEG   Final  . Sample Expiration 05/19/2018 05/30/2018   Final  . Extend sample reason 05/19/2018    Final                   Value:NO TRANSFUSIONS OR PREGNANCY IN THE PAST 3 MONTHS Performed at [facility], [place] [address], [place], [place] [address]   . Hgb A1c MFr Bld 05/19/2018 5.9* 4.8 - 5.6 % Final   Comment: (NOTE) Pre diabetes:          5.7%-6.4% Diabetes:              >6.4% Glycemic control for   <7.0% adults with diabetes   . Mean Plasma Glucose 05/19/2018 122.63  mg/dL Final   Performed at [place] [address] [place], [place] [address]  . MRSA, PCR 05/19/2018 NEGATIVE  NEGATIVE Final  . Staphylococcus aureus 05/19/2018 NEGATIVE  NEGATIVE Final   Comment: (NOTE) The Xpert SA Assay (FDA approved for NASAL specimens in patients 42 years of age and older), is one component of a comprehensive surveillance program. It is not intended to diagnose infection nor to guide or monitor treatment. Performed at [facility], [place] [address] [place], [place] [address]      X-Rays:Dg Pelvis Portable  Result Date: 05/27/2018 CLINICAL DATA:  Status post total hip replacement EXAM: PORTABLE PELVIS 1-2 VIEWS COMPARISON:  Intraoperative images obtained earlier in the day FINDINGS: There is a total hip replacement on the right with prosthetic components well-seated. No acute fracture or dislocation. Left hip and sacroiliac joints appear normal. IMPRESSION: Status post total hip replacement right prosthetic components well-seated. No fracture or dislocation. Other joint spaces appear unremarkable. Electronically Signed   By:  Lowella Grip III M.D.   On: 05/27/2018 10:00   Dg C-arm 1-60 Min-no Report  Result Date: 05/27/2018 Fluoroscopy was utilized by the requesting physician.  No radiographic interpretation.   Dg Hip Operative Unilat W Or W/o Pelvis Right  Result Date: 05/27/2018 CLINICAL DATA:  Right total hip replacement. EXAM: OPERATIVE right HIP (WITH PELVIS IF PERFORMED) 1 VIEWS TECHNIQUE: Fluoroscopic spot image(s) were submitted for interpretation post-operatively. COMPARISON:  None. FINDINGS: Single intraoperative AP image demonstrates right total hip replacement. The hip appears located. IMPRESSION: Right total hip replacement without radiographic evidence for complication. Electronically Signed   By: San Morelle M.D.   On: 05/27/2018 09:21    EKG: Orders placed or performed in visit on 03/18/18  . EKG 12-Lead     Hospital Course: Cristian Moore is a 78 y.o. who was admitted to [facility]. They were brought to the operating room on 05/27/2018 and underwent Procedure(s): RIGHT TOTAL HIP ARTHROPLASTY ANTERIOR APPROACH.  Patient tolerated the procedure well and was later transferred to the recovery room and then to the orthopaedic floor for postoperative care. They were given PO and IV analgesics for pain control following their surgery. They were given 24 hours of postoperative antibiotics of  Anti-infectives (From admission, onward)   Start     Dose/Rate Route Frequency Ordered Stop   05/27/18 1900  vancomycin (VANCOCIN) IVPB 1000 mg/200 mL premix     1,000 mg 200 mL/hr over 60 Minutes Intravenous Every 12 hours 05/27/18 1124 05/27/18 2034   05/27/18 0000  vancomycin (VANCOCIN) 1,500 mg in sodium chloride 0.9 % 500 mL IVPB  1,500 mg 250 mL/hr over 120 Minutes Intravenous 30 min pre-op 05/26/18 5374 05/27/18 0745     and started on DVT prophylaxis in the form of Aspirin.   PT and OT were ordered for total joint protocol. Discharge planning consulted to help with postop  disposition and equipment needs.  Patient had a good night on the evening of surgery. They started to get up OOB with therapy on POD #0. Pt was seen during rounds and was ready to go home pending progress with therapy. Hemovac drain was pulled without difficulty. He worked with therapy on POD #1 and was meeting his goals. Pt was discharged to home later that day in stable condition.  Diet: Diabetic diet Activity: WBAT Follow-up: in 2 weeks with Dr. Wynelle Link Disposition: Home with HHPT Discharged Condition: stable   Discharge Instructions    Call MD / Call 911   Complete by:  As directed    If you experience chest pain or shortness of breath, CALL 911 and be transported to the hospital emergency room.  If you develope a fever above 101 F, pus (white drainage) or increased drainage or redness at the wound, or calf pain, call your surgeon's office.   Change dressing   Complete by:  As directed    You may change your dressing on Friday, then change the dressing daily with sterile 4 x 4 inch gauze dressing and paper tape.   Constipation Prevention   Complete by:  As directed    Drink plenty of fluids.  Prune juice may be helpful.  You may use a stool softener, such as Colace (over the counter) 100 mg twice a day.  Use MiraLax (over the counter) for constipation as needed.   Diet - low sodium heart healthy   Complete by:  As directed    Discharge instructions   Complete by:  As directed    Dr. [name] Total Joint Specialist [facility] [address], [place], [place] [address] [phone]  ANTERIOR APPROACH TOTAL HIP REPLACEMENT POSTOPERATIVE DIRECTIONS   Hip Rehabilitation, Guidelines Following Surgery  The results of a hip operation are greatly improved after range of motion and muscle strengthening exercises. Follow all safety measures which are given to protect your hip. If any of these exercises cause increased pain or swelling in your joint, decrease the amount  until you are comfortable again. Then slowly increase the exercises. Call your caregiver if you have problems or questions.   HOME CARE INSTRUCTIONS  Remove items at home which could result in a fall. This includes throw rugs or furniture in walking pathways.  ICE to the affected hip every three hours for 30 minutes at a time and then as needed for pain and swelling.  Continue to use ice on the hip for pain and swelling from surgery. You may notice swelling that will progress down to the foot and ankle.  This is normal after surgery.  Elevate the leg when you are not up walking on it.   Continue to use the breathing machine which will help keep your temperature down.  It is common for your temperature to cycle up and down following surgery, especially at night when you are not up moving around and exerting yourself.  The breathing machine keeps your lungs expanded and your temperature down.  DIET You may resume your previous home diet once your are discharged from the hospital.  DRESSING / WOUND CARE / SHOWERING You may shower 3 days after surgery,  but keep the wounds dry during showering.  You may use an occlusive plastic wrap (Press'n Seal for example), NO SOAKING/SUBMERGING IN THE BATHTUB.  If the bandage gets wet, change with a clean dry gauze.  If the incision gets wet, pat the wound dry with a clean towel. You may start showering once you are discharged home but do not submerge the incision under water. Just pat the incision dry and apply a dry gauze dressing on daily. Change the surgical dressing daily and reapply a dry dressing each time.  ACTIVITY Walk with your walker as instructed. Use walker as long as suggested by your caregivers. Avoid periods of inactivity such as sitting longer than an hour when not asleep. This helps prevent blood clots.  You may resume a sexual relationship in one month or when given the OK by your doctor.  You may return to work once you are cleared by your  doctor.  Do not drive a car for 6 weeks or until released by you surgeon.  Do not drive while taking narcotics.  WEIGHT BEARING Weight bearing as tolerated with assist device (walker, cane, etc) as directed, use it as long as suggested by your surgeon or therapist, typically at least 4-6 weeks.  POSTOPERATIVE CONSTIPATION PROTOCOL Constipation - defined medically as fewer than three stools per week and severe constipation as less than one stool per week.  One of the most common issues patients have following surgery is constipation.  Even if you have a regular bowel pattern at home, your normal regimen is likely to be disrupted due to multiple reasons following surgery.  Combination of anesthesia, postoperative narcotics, change in appetite and fluid intake all can affect your bowels.  In order to avoid complications following surgery, here are some recommendations in order to help you during your recovery period.  Colace (docusate) - Pick up an over-the-counter form of Colace or another stool softener and take twice a day as long as you are requiring postoperative pain medications.  Take with a full glass of water daily.  If you experience loose stools or diarrhea, hold the colace until you stool forms back up.  If your symptoms do not get better within 1 week or if they get worse, check with your doctor.  Dulcolax (bisacodyl) - Pick up over-the-counter and take as directed by the product packaging as needed to assist with the movement of your bowels.  Take with a full glass of water.  Use this product as needed if not relieved by Colace only.   MiraLax (polyethylene glycol) - Pick up over-the-counter to have on hand.  MiraLax is a solution that will increase the amount of water in your bowels to assist with bowel movements.  Take as directed and can mix with a glass of water, juice, soda, coffee, or tea.  Take if you go more than two days without a movement. Do not use MiraLax more than once per  day. Call your doctor if you are still constipated or irregular after using this medication for 7 days in a row.  If you continue to have problems with postoperative constipation, please contact the office for further assistance and recommendations.  If you experience "the worst abdominal pain ever" or develop nausea or vomiting, please contact the office immediatly for further recommendations for treatment.  ITCHING  If you experience itching with your medications, try taking only a single pain pill, or even half a pain pill at a time.  You can also use  Benadryl over the counter for itching or also to help with sleep.   TED HOSE STOCKINGS Wear the elastic stockings on both legs for three weeks following surgery during the day but you may remove then at night for sleeping.  MEDICATIONS See your medication summary on the "After Visit Summary" that the nursing staff will review with you prior to discharge.  You may have some home medications which will be placed on hold until you complete the course of blood thinner medication.  It is important for you to complete the blood thinner medication as prescribed by your surgeon.  Continue your approved medications as instructed at time of discharge.  PRECAUTIONS If you experience chest pain or shortness of breath - call 911 immediately for transfer to the hospital emergency department.  If you develop a fever greater that 101 F, purulent drainage from wound, increased redness or drainage from wound, foul odor from the wound/dressing, or calf pain - CONTACT YOUR SURGEON.                                                   FOLLOW-UP APPOINTMENTS Make sure you keep all of your appointments after your operation with your surgeon and caregivers. You should call the office at the above phone number and make an appointment for approximately two weeks after the date of your surgery or on the date instructed by your surgeon outlined in the "After Visit  Summary".  RANGE OF MOTION AND STRENGTHENING EXERCISES  These exercises are designed to help you keep full movement of your hip joint. Follow your caregiver's or physical therapist's instructions. Perform all exercises about fifteen times, three times per day or as directed. Exercise both hips, even if you have had only one joint replacement. These exercises can be done on a training (exercise) mat, on the floor, on a table or on a bed. Use whatever works the best and is most comfortable for you. Use music or television while you are exercising so that the exercises are a pleasant break in your day. This will make your life better with the exercises acting as a break in routine you can look forward to.  Lying on your back, slowly slide your foot toward your buttocks, raising your knee up off the floor. Then slowly slide your foot back down until your leg is straight again.  Lying on your back spread your legs as far apart as you can without causing discomfort.  Lying on your side, raise your upper leg and foot straight up from the floor as far as is comfortable. Slowly lower the leg and repeat.  Lying on your back, tighten up the muscle in the front of your thigh (quadriceps muscles). You can do this by keeping your leg straight and trying to raise your heel off the floor. This helps strengthen the largest muscle supporting your knee.  Lying on your back, tighten up the muscles of your buttocks both with the legs straight and with the knee bent at a comfortable angle while keeping your heel on the floor.   IF YOU ARE TRANSFERRED TO A SKILLED REHAB FACILITY If the patient is transferred to a skilled rehab facility following release from the hospital, a list of the current medications will be sent to the facility for the patient to continue.  When discharged from the skilled  rehab facility, please have the facility set up the patient's [place] prior to being released. Also, the skilled  facility will be responsible for providing the patient with their medications at time of release from the facility to include their pain medication, the muscle relaxants, and their blood thinner medication. If the patient is still at the rehab facility at time of the two week follow up appointment, the skilled rehab facility will also need to assist the patient in arranging follow up appointment in our office and any transportation needs.  MAKE SURE YOU:  Understand these instructions.  Get help right away if you are not doing well or get worse.    Pick up stool softner and laxative for home use following surgery while on pain medications. Do not submerge incision under water. Please use good hand washing techniques while changing dressing each day. May shower starting three days after surgery. Please use a clean towel to pat the incision dry following showers. Continue to use ice for pain and swelling after surgery. Do not use any lotions or creams on the incision until instructed by your surgeon.   Do not sit on low chairs, stoools or toilet seats, as it may be difficult to get up from low surfaces   Complete by:  As directed    Driving restrictions   Complete by:  As directed    No driving for two weeks   TED hose   Complete by:  As directed    Use stockings (TED hose) for three weeks on both leg(s).  You may remove them at night for sleeping.   Weight bearing as tolerated   Complete by:  As directed      Allergies as of 05/28/2018      Reactions   Penicillins Rash   Over 60 years ago Has patient had a PCN reaction causing immediate rash, facial/tongue/throat swelling, SOB or lightheadedness with hypotension: Unknown Has patient had a PCN reaction causing severe rash involving mucus membranes or skin necrosis: Unknown Has patient had a PCN reaction that required hospitalization: No Has patient had a PCN reaction occurring within the last 10 years: No If all of the above answers  are "NO", then may proceed with Cephalosporin use.      Medication List    STOP taking these medications   aspirin 81 MG tablet Replaced by:  aspirin EC 325 MG tablet     TAKE these medications   acetaminophen 500 MG tablet Commonly known as:  TYLENOL Take 1,000 mg by mouth every 6 (six) hours as needed for moderate pain.   AEROCHAMBER MV inhaler Use as instructed   albuterol 108 (90 Base) MCG/ACT inhaler Commonly known as:  PROVENTIL HFA;VENTOLIN HFA Inhale 2 puffs into the lungs every 6 (six) hours as needed for wheezing or shortness of breath.   aspirin EC 325 MG tablet Take 1 tablet (325 mg total) by mouth 2 (two) times daily for 20 days. Replaces:  aspirin 81 MG tablet   CENTRUM SILVER PO Take 1 tablet by mouth daily.   Co Q-10 100 MG Caps Take 100 mg by mouth daily.   escitalopram 10 MG tablet Commonly known as:  LEXAPRO Take 1 tablet (10 mg total) by mouth daily. as directed   fluticasone 50 MCG/ACT nasal spray Commonly known as:  FLONASE USE 2 SPRAYS IN EACH       NOSTRIL DAILY   HYDROcodone-acetaminophen 5-325 MG tablet Commonly known as:  NORCO/VICODIN Take 1-2  tablets by mouth every 6 (six) hours as needed for moderate pain.   hydroxypropyl methylcellulose / hypromellose 2.5 % ophthalmic solution Commonly known as:  ISOPTO TEARS / GONIOVISC Place 1 drop into both eyes daily as needed for dry eyes.   metFORMIN 500 MG 24 hr tablet Commonly known as:  GLUCOPHAGE-XR TAKE 1 TABLET DAILY WITH   BREAKFAST   methocarbamol 500 MG tablet Commonly known as:  ROBAXIN Take 1 tablet (500 mg total) by mouth every 6 (six) hours as needed for muscle spasms.   metoprolol tartrate 50 MG tablet Commonly known as:  LOPRESSOR TAKE 1/2 TABLET TWICE A DAY   montelukast 10 MG tablet Commonly known as:  SINGULAIR Take 1 tablet (10 mg total) by mouth at bedtime.   nitroGLYCERIN 0.4 MG SL tablet Commonly known as:  NITROSTAT DISSOLVE ONE TABLET UNDER THE TONGUE EVERY  5 MINUTES AS NEEDED FOR CHEST PAIN.  DO NOT EXCEED A TOTAL OF 3 DOSES IN 15 MINUTES What changed:    how much to take  how to take this  when to take this  reasons to take this   [facility] DELICA LANCETS 09W Misc Check BS BID and PRN. DX. E11.8   ONETOUCH VERIO test strip Generic drug:  glucose blood USE TO CHECK BLOOD SUGAR TWICE DAILY AND AS NEEDED   predniSONE 10 MG tablet Commonly known as:  DELTASONE Take 5 daily for 3 days followed by 4,3,2 and 1 for 3 days each.   PRESCRIPTION MEDICATION Inhale 1 application into the lungs at bedtime. "CPAP" MACHINE   simvastatin 20 MG tablet Commonly known as:  ZOCOR TAKE 1 TABLET AT BEDTIME   sodium chloride 0.65 % nasal spray Commonly known as:  OCEAN Place 1 spray into the nose daily as needed for congestion.   sucralfate 1 g tablet Commonly known as:  CARAFATE Take 1 tablet by mouth  twice a day What changed:    when to take this  reasons to take this   tamsulosin 0.4 MG Caps capsule Commonly known as:  FLOMAX Take 0.4 mg by mouth daily after supper.   Vitamin D 50 MCG (2000 UT) tablet Take 2,000 Units by mouth daily.   zolpidem 10 MG tablet Commonly known as:  AMBIEN TAKE ONE TABLET AT BEDTIME What changed:    when to take this  reasons to take this            Discharge Care Instructions  (From admission, onward)         Start     Ordered   05/27/18 0000  Weight bearing as tolerated     05/27/18 1841   05/27/18 0000  Change dressing    Comments:  You may change your dressing on Friday, then change the dressing daily with sterile 4 x 4 inch gauze dressing and paper tape.   05/27/18 1841         Follow-up Information    [name], MD. Schedule an appointment as soon as possible for a visit on 06/11/2018.   Specialty:  Orthopedic Surgery Contact information: [address] [place] [address] [phone]        Care, [facility] Follow up.   Specialty:   Home Health Services Why:  Will call you 24 to 48 hours after discharge. You may call them.  Contact information: [place] [address] [phone]           Signed: Theresa Duty, PA-C  Orthopedic Surgery 06/01/2018, 10:20 AM

## 2018-06-02 DIAGNOSIS — Z471 Aftercare following joint replacement surgery: Secondary | ICD-10-CM | POA: Diagnosis not present

## 2018-06-02 DIAGNOSIS — J439 Emphysema, unspecified: Secondary | ICD-10-CM | POA: Diagnosis not present

## 2018-06-02 DIAGNOSIS — E119 Type 2 diabetes mellitus without complications: Secondary | ICD-10-CM | POA: Diagnosis not present

## 2018-06-02 DIAGNOSIS — I119 Hypertensive heart disease without heart failure: Secondary | ICD-10-CM | POA: Diagnosis not present

## 2018-06-02 DIAGNOSIS — J45909 Unspecified asthma, uncomplicated: Secondary | ICD-10-CM | POA: Diagnosis not present

## 2018-06-04 DIAGNOSIS — Z471 Aftercare following joint replacement surgery: Secondary | ICD-10-CM | POA: Diagnosis not present

## 2018-06-04 DIAGNOSIS — E119 Type 2 diabetes mellitus without complications: Secondary | ICD-10-CM | POA: Diagnosis not present

## 2018-06-04 DIAGNOSIS — I119 Hypertensive heart disease without heart failure: Secondary | ICD-10-CM | POA: Diagnosis not present

## 2018-06-04 DIAGNOSIS — J45909 Unspecified asthma, uncomplicated: Secondary | ICD-10-CM | POA: Diagnosis not present

## 2018-06-04 DIAGNOSIS — J439 Emphysema, unspecified: Secondary | ICD-10-CM | POA: Diagnosis not present

## 2018-06-08 DIAGNOSIS — Z471 Aftercare following joint replacement surgery: Secondary | ICD-10-CM | POA: Diagnosis not present

## 2018-06-08 DIAGNOSIS — E119 Type 2 diabetes mellitus without complications: Secondary | ICD-10-CM | POA: Diagnosis not present

## 2018-06-08 DIAGNOSIS — I119 Hypertensive heart disease without heart failure: Secondary | ICD-10-CM | POA: Diagnosis not present

## 2018-06-08 DIAGNOSIS — J45909 Unspecified asthma, uncomplicated: Secondary | ICD-10-CM | POA: Diagnosis not present

## 2018-06-08 DIAGNOSIS — J439 Emphysema, unspecified: Secondary | ICD-10-CM | POA: Diagnosis not present

## 2018-06-11 ENCOUNTER — Ambulatory Visit (INDEPENDENT_AMBULATORY_CARE_PROVIDER_SITE_OTHER): Payer: Medicare HMO

## 2018-06-11 DIAGNOSIS — N400 Enlarged prostate without lower urinary tract symptoms: Secondary | ICD-10-CM

## 2018-06-11 DIAGNOSIS — I2510 Atherosclerotic heart disease of native coronary artery without angina pectoris: Secondary | ICD-10-CM

## 2018-06-11 DIAGNOSIS — K5790 Diverticulosis of intestine, part unspecified, without perforation or abscess without bleeding: Secondary | ICD-10-CM

## 2018-06-11 DIAGNOSIS — E119 Type 2 diabetes mellitus without complications: Secondary | ICD-10-CM | POA: Diagnosis not present

## 2018-06-11 DIAGNOSIS — E785 Hyperlipidemia, unspecified: Secondary | ICD-10-CM

## 2018-06-11 DIAGNOSIS — J45909 Unspecified asthma, uncomplicated: Secondary | ICD-10-CM | POA: Diagnosis not present

## 2018-06-11 DIAGNOSIS — Z96641 Presence of right artificial hip joint: Secondary | ICD-10-CM

## 2018-06-11 DIAGNOSIS — E6609 Other obesity due to excess calories: Secondary | ICD-10-CM | POA: Diagnosis not present

## 2018-06-11 DIAGNOSIS — G4733 Obstructive sleep apnea (adult) (pediatric): Secondary | ICD-10-CM

## 2018-06-11 DIAGNOSIS — I6523 Occlusion and stenosis of bilateral carotid arteries: Secondary | ICD-10-CM

## 2018-06-11 DIAGNOSIS — I471 Supraventricular tachycardia: Secondary | ICD-10-CM | POA: Diagnosis not present

## 2018-06-11 DIAGNOSIS — I119 Hypertensive heart disease without heart failure: Secondary | ICD-10-CM | POA: Diagnosis not present

## 2018-06-11 DIAGNOSIS — G4700 Insomnia, unspecified: Secondary | ICD-10-CM

## 2018-06-11 DIAGNOSIS — J439 Emphysema, unspecified: Secondary | ICD-10-CM

## 2018-06-11 DIAGNOSIS — Z471 Aftercare following joint replacement surgery: Secondary | ICD-10-CM | POA: Diagnosis not present

## 2018-06-11 DIAGNOSIS — I700 Atherosclerosis of aorta: Secondary | ICD-10-CM

## 2018-06-11 DIAGNOSIS — Z6835 Body mass index (BMI) 35.0-35.9, adult: Secondary | ICD-10-CM

## 2018-06-11 DIAGNOSIS — E559 Vitamin D deficiency, unspecified: Secondary | ICD-10-CM

## 2018-06-11 DIAGNOSIS — H5340 Unspecified visual field defects: Secondary | ICD-10-CM

## 2018-06-16 DIAGNOSIS — E119 Type 2 diabetes mellitus without complications: Secondary | ICD-10-CM | POA: Diagnosis not present

## 2018-06-16 DIAGNOSIS — I119 Hypertensive heart disease without heart failure: Secondary | ICD-10-CM | POA: Diagnosis not present

## 2018-06-16 DIAGNOSIS — J45909 Unspecified asthma, uncomplicated: Secondary | ICD-10-CM | POA: Diagnosis not present

## 2018-06-16 DIAGNOSIS — Z471 Aftercare following joint replacement surgery: Secondary | ICD-10-CM | POA: Diagnosis not present

## 2018-06-16 DIAGNOSIS — J439 Emphysema, unspecified: Secondary | ICD-10-CM | POA: Diagnosis not present

## 2018-06-18 DIAGNOSIS — Z471 Aftercare following joint replacement surgery: Secondary | ICD-10-CM | POA: Diagnosis not present

## 2018-06-18 DIAGNOSIS — J45909 Unspecified asthma, uncomplicated: Secondary | ICD-10-CM | POA: Diagnosis not present

## 2018-06-18 DIAGNOSIS — E119 Type 2 diabetes mellitus without complications: Secondary | ICD-10-CM | POA: Diagnosis not present

## 2018-06-18 DIAGNOSIS — J439 Emphysema, unspecified: Secondary | ICD-10-CM | POA: Diagnosis not present

## 2018-06-18 DIAGNOSIS — I119 Hypertensive heart disease without heart failure: Secondary | ICD-10-CM | POA: Diagnosis not present

## 2018-06-22 ENCOUNTER — Other Ambulatory Visit: Payer: Self-pay | Admitting: Nurse Practitioner

## 2018-06-23 DIAGNOSIS — J439 Emphysema, unspecified: Secondary | ICD-10-CM | POA: Diagnosis not present

## 2018-06-23 DIAGNOSIS — E119 Type 2 diabetes mellitus without complications: Secondary | ICD-10-CM | POA: Diagnosis not present

## 2018-06-23 DIAGNOSIS — I119 Hypertensive heart disease without heart failure: Secondary | ICD-10-CM | POA: Diagnosis not present

## 2018-06-23 DIAGNOSIS — J45909 Unspecified asthma, uncomplicated: Secondary | ICD-10-CM | POA: Diagnosis not present

## 2018-06-23 DIAGNOSIS — Z471 Aftercare following joint replacement surgery: Secondary | ICD-10-CM | POA: Diagnosis not present

## 2018-06-25 ENCOUNTER — Other Ambulatory Visit: Payer: Self-pay | Admitting: *Deleted

## 2018-06-25 DIAGNOSIS — J45909 Unspecified asthma, uncomplicated: Secondary | ICD-10-CM | POA: Diagnosis not present

## 2018-06-25 DIAGNOSIS — J439 Emphysema, unspecified: Secondary | ICD-10-CM | POA: Diagnosis not present

## 2018-06-25 DIAGNOSIS — I119 Hypertensive heart disease without heart failure: Secondary | ICD-10-CM | POA: Diagnosis not present

## 2018-06-25 DIAGNOSIS — E119 Type 2 diabetes mellitus without complications: Secondary | ICD-10-CM | POA: Diagnosis not present

## 2018-06-25 DIAGNOSIS — Z471 Aftercare following joint replacement surgery: Secondary | ICD-10-CM | POA: Diagnosis not present

## 2018-06-25 MED ORDER — SIMVASTATIN 20 MG PO TABS: 20.0000 mg | ORAL_TABLET | Freq: Every day | ORAL | 3 refills | Status: DC

## 2018-06-25 NOTE — Telephone Encounter (Signed)
Last seen 04/29/18   DWM

## 2018-06-29 ENCOUNTER — Other Ambulatory Visit: Payer: Self-pay | Admitting: *Deleted

## 2018-06-29 MED ORDER — SUCRALFATE 1 G PO TABS: 1.0000 g | ORAL_TABLET | Freq: Every day | ORAL | 0 refills | Status: DC | PRN

## 2018-07-07 DIAGNOSIS — Z96641 Presence of right artificial hip joint: Secondary | ICD-10-CM | POA: Diagnosis not present

## 2018-07-07 DIAGNOSIS — Z471 Aftercare following joint replacement surgery: Secondary | ICD-10-CM | POA: Diagnosis not present

## 2018-07-07 MED ORDER — SUCRALFATE 1 G PO TABS: 1.0000 g | ORAL_TABLET | Freq: Every day | ORAL | 0 refills | Status: DC | PRN

## 2018-07-07 NOTE — Addendum Note (Signed)
Addended by: Antonietta Barcelona D on: 07/07/2018 11:49 AM   Modules accepted: Orders

## 2018-07-20 ENCOUNTER — Other Ambulatory Visit: Payer: Self-pay | Admitting: Family Medicine

## 2018-07-24 ENCOUNTER — Encounter: Payer: Self-pay | Admitting: Family Medicine

## 2018-07-24 ENCOUNTER — Ambulatory Visit (INDEPENDENT_AMBULATORY_CARE_PROVIDER_SITE_OTHER): Payer: Medicare HMO | Admitting: Family Medicine

## 2018-07-24 VITALS — BP 121/74 | HR 75 | Temp 97.1°F | Ht 73.0 in | Wt 270.0 lb

## 2018-07-24 DIAGNOSIS — M150 Primary generalized (osteo)arthritis: Secondary | ICD-10-CM

## 2018-07-24 DIAGNOSIS — I10 Essential (primary) hypertension: Secondary | ICD-10-CM | POA: Diagnosis not present

## 2018-07-24 DIAGNOSIS — E559 Vitamin D deficiency, unspecified: Secondary | ICD-10-CM | POA: Diagnosis not present

## 2018-07-24 DIAGNOSIS — M5116 Intervertebral disc disorders with radiculopathy, lumbar region: Secondary | ICD-10-CM

## 2018-07-24 DIAGNOSIS — E782 Mixed hyperlipidemia: Secondary | ICD-10-CM | POA: Diagnosis not present

## 2018-07-24 DIAGNOSIS — Z96641 Presence of right artificial hip joint: Secondary | ICD-10-CM | POA: Diagnosis not present

## 2018-07-24 DIAGNOSIS — M159 Polyosteoarthritis, unspecified: Secondary | ICD-10-CM

## 2018-07-24 DIAGNOSIS — K219 Gastro-esophageal reflux disease without esophagitis: Secondary | ICD-10-CM | POA: Diagnosis not present

## 2018-07-24 DIAGNOSIS — I700 Atherosclerosis of aorta: Secondary | ICD-10-CM | POA: Diagnosis not present

## 2018-07-24 DIAGNOSIS — N400 Enlarged prostate without lower urinary tract symptoms: Secondary | ICD-10-CM | POA: Diagnosis not present

## 2018-07-24 DIAGNOSIS — M8949 Other hypertrophic osteoarthropathy, multiple sites: Secondary | ICD-10-CM

## 2018-07-24 DIAGNOSIS — E118 Type 2 diabetes mellitus with unspecified complications: Secondary | ICD-10-CM

## 2018-07-24 LAB — BAYER DCA HB A1C WAIVED: HB A1C (BAYER DCA - WAIVED): 5.6 % (ref <7.0)

## 2018-07-24 MED ORDER — ZOLPIDEM TARTRATE 10 MG PO TABS: 10.0000 mg | ORAL_TABLET | Freq: Every day | ORAL | 1 refills | Status: DC

## 2018-07-24 NOTE — Progress Notes (Signed)
Subjective:    Patient ID: Cristian Moore, male    DOB: December 01, 1940, 78 y.o.   MRN: [id number]  HPI Pt here for follow up and management of chronic medical problems which includes diabetes, hypertension and hyperlipidemia. He is taking medication regularly.  The patient comes in today for his regular checkup.  He recently had right hip arthroplasty and is having some pain in the right leg currently.  He also complains of arthralgias all over.  He is requesting a refill on his Ambien.  He will be given an FOBT to return and will get lab work today.  He is also diabetic.  His vital signs are stable his weight is up 3 pounds since the last visit and his BMI is 34.97.  The patient is doing well and he is about 2 months out from hip replacement on the right hip by Dr. Reynaldo Minium.  He is having some right lower leg pain and will discuss this with him when he sees him in another month.  He is also overweight and knows that he needs to lose some weight.  He denies any chest pain pressure tightness or shortness of breath.  He has occasional indigestion and I reminded him today that he has to stop taking the ranitidine and change to Pepcid [facility] for this.  He denies any trouble with swallowing nausea vomiting diarrhea blood in the stool black tarry bowel habits or change in bowel habits.  He is passing his water without problems.  The patient did have a CT scan and June 2018 and this was for bilateral pulmonary nodules since 2013 because they have been followed regularly it was felt at that time that no further CT scans were necessary.   Patient Active Problem List   Diagnosis Date Noted  . OA (osteoarthritis) of hip 05/27/2018  . Preop cardiovascular exam 03/18/2018  . Pulmonary emphysema ([place]) 10/30/2017  . Aortic atherosclerosis ([place]) 10/30/2017  . Cough 09/16/2016  . Chest pain 03/20/2015  . Chest tightness 03/19/2015  . Numbness of left hand 03/19/2015  . Insomnia 07/19/2014  . Vitamin D deficiency  07/19/2014  . BPH (benign prostatic hyperplasia) 07/19/2014  . DM (diabetes mellitus), type 2 with complications ([place]) 94/50/3888  . H/O blepharoplasty 07/27/2013  . Acid reflux 06/03/2013  . Personal history of other diseases of circulatory system 06/03/2013  . History of PSVT (paroxysmal supraventricular tachycardia) 06/03/2013  . GERD (gastroesophageal reflux disease) 06/03/2013  . Hiatal hernia 06/03/2013  . Peripheral visual field defect of both eyes 05/18/2013  . Excess skin of eyelid 05/18/2013  . Non-Obstructive CAD 03/05/2013  . Essential hypertension 02/22/2013  . Shortness of breath   . Obstructive sleep apnea   . Diverticulitis, recurrent, s/p lap sigmoid colectomy 07/23/2012 06/02/2012  . Carotid artery stenosis 09/28/2009  . Obesity (BMI 30-39.9) 08/02/2009  . HLD (hyperlipidemia) 08/01/2009   Outpatient Encounter Medications as of 07/24/2018  Medication Sig  . acetaminophen (TYLENOL) 500 MG tablet Take 1,000 mg by mouth every 6 (six) hours as needed for moderate pain.  Marland Kitchen albuterol (PROVENTIL HFA;VENTOLIN HFA) 108 (90 Base) MCG/ACT inhaler Inhale 2 puffs into the lungs every 6 (six) hours as needed for wheezing or shortness of breath.  . Cholecalciferol (VITAMIN D) 50 MCG (2000 UT) tablet Take 2,000 Units by mouth daily.   . Coenzyme Q10 (CO Q-10) 100 MG CAPS Take 100 mg by mouth daily.  Marland Kitchen escitalopram (LEXAPRO) 10 MG tablet Take 1 tablet (10 mg total) by mouth daily.  as directed  . fluticasone (FLONASE) 50 MCG/ACT nasal spray USE 2 SPRAYS IN EACH       NOSTRIL DAILY (Patient taking differently: Place 2 sprays into both nostrils daily. )  . hydroxypropyl methylcellulose / hypromellose (ISOPTO TEARS / GONIOVISC) 2.5 % ophthalmic solution Place 1 drop into both eyes daily as needed for dry eyes.  . metFORMIN (GLUCOPHAGE-XR) 500 MG 24 hr tablet TAKE 1 TABLET DAILY WITH   BREAKFAST  . methocarbamol (ROBAXIN) 500 MG tablet Take 1 tablet (500 mg total) by mouth every 6 (six) hours  as needed for muscle spasms.  . metoprolol tartrate (LOPRESSOR) 50 MG tablet TAKE 1/2 TABLET TWICE A DAY (Patient taking differently: Take 25 mg by mouth 2 (two) times daily. )  . montelukast (SINGULAIR) 10 MG tablet Take 1 tablet (10 mg total) by mouth at bedtime.  . Multiple Vitamins-Minerals (CENTRUM SILVER PO) Take 1 tablet by mouth daily.  . nitroGLYCERIN (NITROSTAT) 0.4 MG SL tablet DISSOLVE ONE TABLET UNDER THE TONGUE EVERY 5 MINUTES AS NEEDED FOR CHEST PAIN.  DO NOT EXCEED A TOTAL OF 3 DOSES IN 15 MINUTES (Patient taking differently: Place 0.4 mg under the tongue every 5 (five) minutes as needed for chest pain. DISSOLVE ONE TABLET UNDER THE TONGUE EVERY 5 MINUTES AS NEEDED FOR CHEST PAIN.  DO NOT EXCEED A TOTAL OF 3 DOSES IN 15 MINUTES)  . ONETOUCH DELICA LANCETS 81E MISC Check BS BID and PRN. DX. E11.8  . ONETOUCH VERIO test strip USE TO CHECK BLOOD SUGAR TWICE DAILY AND AS NEEDED  . PRESCRIPTION MEDICATION Inhale 1 application into the lungs at bedtime. "CPAP" MACHINE  . simvastatin (ZOCOR) 20 MG tablet Take 1 tablet (20 mg total) by mouth at bedtime.  . sodium chloride (OCEAN) 0.65 % nasal spray Place 1 spray into the nose daily as needed for congestion.   Marland Kitchen Spacer/Aero-Holding Chambers (AEROCHAMBER MV) inhaler Use as instructed  . sucralfate (CARAFATE) 1 g tablet Take 1 tablet (1 g total) by mouth daily as needed (stomach coating).  . tamsulosin (FLOMAX) 0.4 MG CAPS capsule Take 0.4 mg by mouth daily after supper.  . zolpidem (AMBIEN) 10 MG tablet TAKE ONE TABLET AT BEDTIME  . [DISCONTINUED] HYDROcodone-acetaminophen (NORCO/VICODIN) 5-325 MG tablet Take 1-2 tablets by mouth every 6 (six) hours as needed for moderate pain.  . [DISCONTINUED] predniSONE (DELTASONE) 10 MG tablet Take 5 daily for 3 days followed by 4,3,2 and 1 for 3 days each. (Patient not taking: Reported on 05/14/2018)   No facility-administered encounter medications on file as of 07/24/2018.       Review of Systems    Constitutional: Negative.   HENT: Negative.   Eyes: Negative.   Respiratory: Negative.   Cardiovascular: Negative.   Gastrointestinal: Negative.   Endocrine: Negative.   Genitourinary: Negative.   Musculoskeletal: Positive for arthralgias (all over  // right leg hurts since surgery ).  Skin: Negative.   Allergic/Immunologic: Negative.   Neurological: Negative.   Hematological: Negative.   Psychiatric/Behavioral: Negative.        Objective:   Physical Exam Vitals signs and nursing note reviewed.  Constitutional:      Appearance: Normal appearance. He is well-developed. He is obese. He is not ill-appearing.     Comments: Patient is pleasant and alert and recovering well from his right hip arthroplasty.  HENT:     Head: Normocephalic and atraumatic.     Right Ear: Tympanic membrane, ear canal and external ear normal. There is no impacted  cerumen.     Left Ear: Tympanic membrane, ear canal and external ear normal. There is no impacted cerumen.     Nose: Nose normal. No congestion.     Mouth/Throat:     Mouth: Mucous membranes are moist.     Pharynx: Oropharynx is clear. No oropharyngeal exudate.  Eyes:     General: No scleral icterus.       Right eye: No discharge.        Left eye: No discharge.     Extraocular Movements: Extraocular movements intact.     Conjunctiva/sclera: Conjunctivae normal.     Pupils: Pupils are equal, round, and reactive to light.  Neck:     Musculoskeletal: Normal range of motion and neck supple.     Thyroid: No thyromegaly.     Vascular: No carotid bruit.     Trachea: No tracheal deviation.     Comments: No bruits thyromegaly or anterior cervical adenopathy Cardiovascular:     Rate and Rhythm: Normal rate and regular rhythm.     Pulses: Normal pulses.     Heart sounds: Normal heart sounds. No murmur. No friction rub.     Comments: Heart is regular at 72/min with good pedal pulses and no edema Pulmonary:     Effort: Pulmonary effort is normal.      Breath sounds: Normal breath sounds. No wheezing or rales.     Comments: Lungs are clear anteriorly and posteriorly with no axillary adenopathy chest wall masses or chest wall tenderness. Abdominal:     General: Bowel sounds are normal.     Palpations: Abdomen is soft. There is no mass.     Tenderness: There is no abdominal tenderness.     Comments: Abdomen is obese without masses tenderness organ enlargement bruits or suprapubic tenderness.  No inguinal adenopathy.  Musculoskeletal: Normal range of motion.        General: No tenderness.     Right lower leg: No edema.     Left lower leg: No edema.  Lymphadenopathy:     Cervical: No cervical adenopathy.  Skin:    General: Skin is warm and dry.     Coloration: Skin is not pale.     Findings: No rash.     Comments: Arthroplasty wound is well-healed fairly good range of motion of all joints.  Neurological:     General: No focal deficit present.     Mental Status: He is alert and oriented to person, place, and time. Mental status is at baseline.     Cranial Nerves: No cranial nerve deficit.     Gait: Gait normal.     Deep Tendon Reflexes: Reflexes are normal and symmetric. Reflexes normal.  Psychiatric:        Mood and Affect: Mood normal.        Behavior: Behavior normal.        Thought Content: Thought content normal.        Judgment: Judgment normal.     Comments: Mood affect and behavior for this patient are all normal.  He is encouraged to continue with his exercise regimen and to lose weight.    BP 121/74 (BP Location: Left Arm)   Pulse 75   Temp (!) 97.1 F (36.2 C) (Oral)   Ht 6' 1" (1.854 m)   Wt 270 lb (122.5 kg)   BMI 35.62 kg/m         Assessment & Plan:  1. Type 2 diabetes mellitus with complication, without  long-term current use of insulin ([place]) -Continue with current treatment and as aggressive therapeutic lifestyle changes as possible with diet and exercise. - CBC with Differential/Platelet - Bayer DCA  Hb A1c Waived  2. Mixed hyperlipidemia -Continue with simvastatin and with as aggressive therapeutic lifestyle changes as possible pending results of lab work - CBC with Differential/Platelet - Lipid panel - Hepatic function panel  3. Essential hypertension -Continue with current treatment as blood pressure is good today. - BMP8+EGFR - CBC with Differential/Platelet - Hepatic function panel  4. Vitamin D deficiency -Continue with vitamin D replacement - CBC with Differential/Platelet - VITAMIN D 25 Hydroxy (Vit-D Deficiency, Fractures)  5. Benign prostatic hyperplasia, unspecified whether lower urinary tract symptoms present -No complaints today with voiding - CBC with Differential/Platelet  6. Gastroesophageal reflux disease without esophagitis -Discontinue ranitidine and take Pepcid AC instead - CBC with Differential/Platelet  7. Lumbar disc disease with radiculopathy -Continue to proceed with exercises following hip arthroplasty  8. Primary osteoarthritis involving multiple joints -Take Tylenol for pain and avoid NSAIDs as much as possible  9. Aortic atherosclerosis (H[facility]) -Continue simvastatin and as aggressive therapeutic lifestyle changes as possible to achieve weight loss  10. Status post right hip replacement -Follow-up with orthopedic surgeon as planned  Meds ordered this encounter  Medications  . zolpidem (AMBIEN) 10 MG tablet    Sig: Take 1 tablet (10 mg total) by mouth at bedtime.    Dispense:  90 tablet    Refill:  1   Patient Instructions                       Medicare Annual Wellness Visit  [facility] and the medical providers at [facility] strive to bring you the best medical care.  In doing so we not only want to address your current medical conditions and concerns but also to detect new conditions early and prevent illness, disease and health-related problems.    Medicare offers a yearly Wellness Visit which allows our  clinical staff to assess your need for preventative services including immunizations, lifestyle education, counseling to decrease risk of preventable diseases and screening for fall risk and other medical concerns.    This visit is provided free of charge (no copay) for all Medicare recipients. The clinical pharmacists at [facility] have begun to conduct these Wellness Visits which will also include a thorough review of all your medications.    As you primary medical provider recommend that you make an appointment for your Annual Wellness Visit if you have not done so already this year.  You may set up this appointment before you leave today or you may call back ([phone]) and schedule an appointment.  Please make sure when you call that you mention that you are scheduling your Annual Wellness Visit with the clinical pharmacist so that the appointment may be made for the proper length of time.     Continue current medications. Continue good therapeutic lifestyle changes which include good diet and exercise. Fall precautions discussed with patient. If an FOBT was given today- please return it to our front desk. If you are over 15 years old - you may need Prevnar 12 or the adult Pneumonia vaccine.  **Flu shots are available--- please call and schedule a FLU-CLINIC appointment**  After your visit with [place] today you will receive a survey in the mail or online from [employer] regarding your care with [place]. Please take a moment to  fill this out. Your feedback is very important to [place] as you can help [place] better understand your patient needs as well as improve your experience and satisfaction. WE CARE ABOUT YOU!!!   Follow-up with orthopedic surgeon as planned Continue with home exercises Watch sodium intake Continue to work on weight with diet and exercise Take Tylenol for pain Discontinue the ranitidine and only take Pepcid AC twice daily if needed for heartburn and  indigestion  Arrie Senate MD

## 2018-07-24 NOTE — Patient Instructions (Addendum)
Medicare Annual Wellness Visit  [facility] and the medical providers at [facility] strive to bring you the best medical care.  In doing so we not only want to address your current medical conditions and concerns but also to detect new conditions early and prevent illness, disease and health-related problems.    Medicare offers a yearly Wellness Visit which allows our clinical staff to assess your need for preventative services including immunizations, lifestyle education, counseling to decrease risk of preventable diseases and screening for fall risk and other medical concerns.    This visit is provided free of charge (no copay) for all Medicare recipients. The clinical pharmacists at [facility] have begun to conduct these Wellness Visits which will also include a thorough review of all your medications.    As you primary medical provider recommend that you make an appointment for your Annual Wellness Visit if you have not done so already this year.  You may set up this appointment before you leave today or you may call back ([phone]) and schedule an appointment.  Please make sure when you call that you mention that you are scheduling your Annual Wellness Visit with the clinical pharmacist so that the appointment may be made for the proper length of time.     Continue current medications. Continue good therapeutic lifestyle changes which include good diet and exercise. Fall precautions discussed with patient. If an FOBT was given today- please return it to our front desk. If you are over 40 years old - you may need Prevnar 39 or the adult Pneumonia vaccine.  **Flu shots are available--- please call and schedule a FLU-CLINIC appointment**  After your visit with [place] today you will receive a survey in the mail or online from [employer] regarding your care with [place]. Please take a moment to fill this out. Your feedback is very  important to [place] as you can help [place] better understand your patient needs as well as improve your experience and satisfaction. WE CARE ABOUT YOU!!!   Follow-up with orthopedic surgeon as planned Continue with home exercises Watch sodium intake Continue to work on weight with diet and exercise Take Tylenol for pain Discontinue the ranitidine and only take Pepcid AC twice daily if needed for heartburn and indigestion

## 2018-07-25 LAB — BMP8+EGFR
BUN/Creatinine Ratio: 14 (ref 10–24)
BUN: 16 mg/dL (ref 8–27)
CO2: 24 mmol/L (ref 20–29)
Calcium: 10 mg/dL (ref 8.6–10.2)
Chloride: 97 mmol/L (ref 96–106)
Creatinine, Ser: 1.14 mg/dL (ref 0.76–1.27)
GFR calc Af Amer: 71 mL/min/{1.73_m2} (ref >59)
GFR, EST NON AFRICAN AMERICAN: 61 mL/min/{1.73_m2} (ref >59)
Glucose: 110 mg/dL — ABNORMAL HIGH (ref 65–99)
Potassium: 4.6 mmol/L (ref 3.5–5.2)
Sodium: 140 mmol/L (ref 134–144)

## 2018-07-25 LAB — CBC WITH DIFFERENTIAL/PLATELET
Basophils Absolute: 0.1 10*3/uL (ref 0.0–0.2)
Basos: 2 %
EOS (ABSOLUTE): 0.2 10*3/uL (ref 0.0–0.4)
Eos: 2 %
Hematocrit: 40.1 % (ref 37.5–51.0)
Hemoglobin: 13.9 g/dL (ref 13.0–17.7)
Immature Grans (Abs): 0.1 10*3/uL (ref 0.0–0.1)
Immature Granulocytes: 1 %
Lymphocytes Absolute: 1.7 10*3/uL (ref 0.7–3.1)
Lymphs: 22 %
MCH: 32 pg (ref 26.6–33.0)
MCHC: 34.7 g/dL (ref 31.5–35.7)
MCV: 92 fL (ref 79–97)
Monocytes Absolute: 0.9 10*3/uL (ref 0.1–0.9)
Monocytes: 12 %
NEUTROS PCT: 61 %
Neutrophils Absolute: 4.9 10*3/uL (ref 1.4–7.0)
PLATELETS: 305 10*3/uL (ref 150–450)
RBC: 4.34 x10E6/uL (ref 4.14–5.80)
RDW: 12.8 % (ref 11.6–15.4)
WBC: 7.9 10*3/uL (ref 3.4–10.8)

## 2018-07-25 LAB — HEPATIC FUNCTION PANEL
ALK PHOS: 53 IU/L (ref 39–117)
ALT: 14 IU/L (ref 0–44)
AST: 14 IU/L (ref 0–40)
Albumin: 4.5 g/dL (ref 3.7–4.7)
BILIRUBIN, DIRECT: 0.1 mg/dL (ref 0.00–0.40)
Bilirubin Total: 0.4 mg/dL (ref 0.0–1.2)
Total Protein: 6.8 g/dL (ref 6.0–8.5)

## 2018-07-25 LAB — LIPID PANEL
Chol/HDL Ratio: 3.2 ratio (ref 0.0–5.0)
Cholesterol, Total: 149 mg/dL (ref 100–199)
HDL: 47 mg/dL (ref >39)
LDL Calculated: 79 mg/dL (ref 0–99)
Triglycerides: 116 mg/dL (ref 0–149)
VLDL Cholesterol Cal: 23 mg/dL (ref 5–40)

## 2018-07-25 LAB — VITAMIN D 25 HYDROXY (VIT D DEFICIENCY, FRACTURES): Vit D, 25-Hydroxy: 40.5 ng/mL (ref 30.0–100.0)

## 2018-07-31 DIAGNOSIS — K219 Gastro-esophageal reflux disease without esophagitis: Secondary | ICD-10-CM | POA: Diagnosis not present

## 2018-07-31 DIAGNOSIS — E785 Hyperlipidemia, unspecified: Secondary | ICD-10-CM | POA: Diagnosis not present

## 2018-07-31 DIAGNOSIS — R69 Illness, unspecified: Secondary | ICD-10-CM | POA: Diagnosis not present

## 2018-07-31 DIAGNOSIS — I10 Essential (primary) hypertension: Secondary | ICD-10-CM | POA: Diagnosis not present

## 2018-07-31 DIAGNOSIS — J439 Emphysema, unspecified: Secondary | ICD-10-CM | POA: Diagnosis not present

## 2018-07-31 DIAGNOSIS — G4700 Insomnia, unspecified: Secondary | ICD-10-CM | POA: Diagnosis not present

## 2018-07-31 DIAGNOSIS — I25119 Atherosclerotic heart disease of native coronary artery with unspecified angina pectoris: Secondary | ICD-10-CM | POA: Diagnosis not present

## 2018-07-31 DIAGNOSIS — E119 Type 2 diabetes mellitus without complications: Secondary | ICD-10-CM | POA: Diagnosis not present

## 2018-07-31 DIAGNOSIS — M1990 Unspecified osteoarthritis, unspecified site: Secondary | ICD-10-CM | POA: Diagnosis not present

## 2018-07-31 DIAGNOSIS — E669 Obesity, unspecified: Secondary | ICD-10-CM | POA: Diagnosis not present

## 2018-08-10 ENCOUNTER — Other Ambulatory Visit: Payer: Self-pay | Admitting: Family Medicine

## 2018-08-10 DIAGNOSIS — I10 Essential (primary) hypertension: Secondary | ICD-10-CM

## 2018-08-10 DIAGNOSIS — I2510 Atherosclerotic heart disease of native coronary artery without angina pectoris: Secondary | ICD-10-CM

## 2018-08-10 MED ORDER — METOPROLOL TARTRATE 50 MG PO TABS: 25.0000 mg | ORAL_TABLET | Freq: Two times a day (BID) | ORAL | 1 refills | Status: DC

## 2018-08-10 NOTE — Telephone Encounter (Signed)
What is the name of the medication? Metoprolol 50 mg  Have you contacted your pharmacy to request a refill? Yes  Which pharmacy would you like this sent to? [facility]   Patient notified that their request is being sent to the clinical staff for review and that they should receive a call once it is complete. If they do not receive a call within 24 hours they can check with their pharmacy or our office.

## 2018-08-10 NOTE — Telephone Encounter (Signed)
Pt aware refill sent to pharmacy 

## 2018-08-13 DIAGNOSIS — Z96641 Presence of right artificial hip joint: Secondary | ICD-10-CM | POA: Diagnosis not present

## 2018-08-13 DIAGNOSIS — M25561 Pain in right knee: Secondary | ICD-10-CM | POA: Diagnosis not present

## 2018-08-13 DIAGNOSIS — M25562 Pain in left knee: Secondary | ICD-10-CM | POA: Diagnosis not present

## 2018-08-13 DIAGNOSIS — Z471 Aftercare following joint replacement surgery: Secondary | ICD-10-CM | POA: Diagnosis not present

## 2018-08-26 ENCOUNTER — Encounter: Payer: Self-pay | Admitting: Family Medicine

## 2018-08-26 ENCOUNTER — Encounter: Payer: Medicare HMO | Admitting: *Deleted

## 2018-08-27 ENCOUNTER — Ambulatory Visit (INDEPENDENT_AMBULATORY_CARE_PROVIDER_SITE_OTHER): Payer: Medicare HMO | Admitting: *Deleted

## 2018-08-27 VITALS — BP 115/66 | HR 64 | Ht 73.0 in | Wt 271.2 lb

## 2018-08-27 DIAGNOSIS — Z0000 Encounter for general adult medical examination without abnormal findings: Secondary | ICD-10-CM

## 2018-08-27 NOTE — Progress Notes (Addendum)
Subjective:   Cristian Moore is a 78 y.o. male who presents for a subsequent  Medicare Annual Wellness Visit.  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  He is involved with his church.  He lives with his wife Cristian Moore, and has 4 adult children.    Patient Care Team: Chipper Herb, MD as PCP - General (Family Medicine) [name], MD as PCP - Cardiology (Cardiology) Gatha Mayer, MD as Consulting Physician (Gastroenterology) [name], MD as Consulting Physician (Cardiology) Anda Kraft, MD as Consulting Physician (Endocrinology) Tanda Rockers, MD as Consulting Physician (Pulmonary Disease) Netta Cedars, MD as Consulting Physician (Orthopedic Surgery) Suella Broad, MD as Consulting Physician (Physical Medicine and Rehabilitation)  Hospitalizations, surgeries, and ER visits in previous 12 months No ER visits, Had one hospitalization and surgery this past year for right hip arthroplasty.   Review of Systems    Patient reports that his overall health is worse compared to last year due to increased back pain.   Cardiac Risk Factors include: advanced age (>62men, >80 women);diabetes mellitus;dyslipidemia;male gender;obesity (BMI >30kg/m2)   All other systems negative       Current Medications (verified) Outpatient Encounter Medications as of 08/27/2018  Medication Sig  . acetaminophen (TYLENOL) 500 MG tablet Take 1,000 mg by mouth every 6 (six) hours as needed for moderate pain.  Marland Kitchen albuterol (PROVENTIL HFA;VENTOLIN HFA) 108 (90 Base) MCG/ACT inhaler Inhale 2 puffs into the lungs every 6 (six) hours as needed for wheezing or shortness of breath.  . Cholecalciferol (VITAMIN D) 50 MCG (2000 UT) tablet Take 2,000 Units by mouth daily.   . Coenzyme Q10 (CO Q-10) 100 MG CAPS Take 100 mg by mouth daily.  Marland Kitchen escitalopram (LEXAPRO) 10 MG tablet Take 1 tablet (10 mg total) by mouth  daily. as directed  . fluticasone (FLONASE) 50 MCG/ACT nasal spray USE 2 SPRAYS IN EACH       NOSTRIL DAILY (Patient taking differently: Place 2 sprays into both nostrils daily. )  . hydroxypropyl methylcellulose / hypromellose (ISOPTO TEARS / GONIOVISC) 2.5 % ophthalmic solution Place 1 drop into both eyes daily as needed for dry eyes.  . metFORMIN (GLUCOPHAGE-XR) 500 MG 24 hr tablet TAKE 1 TABLET DAILY WITH   BREAKFAST  . metoprolol tartrate (LOPRESSOR) 50 MG tablet Take 0.5 tablets (25 mg total) by mouth 2 (two) times daily.  . montelukast (SINGULAIR) 10 MG tablet Take 1 tablet (10 mg total) by mouth at bedtime.  . Multiple Vitamins-Minerals (CENTRUM SILVER PO) Take 1 tablet by mouth daily.  . nitroGLYCERIN (NITROSTAT) 0.4 MG SL tablet DISSOLVE ONE TABLET UNDER THE TONGUE EVERY 5 MINUTES AS NEEDED FOR CHEST PAIN.  DO NOT EXCEED A TOTAL OF 3 DOSES IN 15 MINUTES (Patient taking differently: Place 0.4 mg under the tongue every 5 (five) minutes as needed for chest pain. DISSOLVE ONE TABLET UNDER THE TONGUE EVERY 5 MINUTES AS NEEDED FOR CHEST PAIN.  DO NOT EXCEED A TOTAL OF 3 DOSES IN 15 MINUTES)  . ONETOUCH DELICA LANCETS 02O MISC Check BS BID and PRN. DX. E11.8  . ONETOUCH VERIO test strip USE TO CHECK BLOOD SUGAR TWICE DAILY AND AS NEEDED  . PRESCRIPTION MEDICATION Inhale 1 application into the lungs at bedtime. "CPAP" MACHINE  . simvastatin (ZOCOR) 20 MG tablet Take 1 tablet (20 mg total) by mouth at bedtime.  . sodium chloride (OCEAN) 0.65 %  nasal spray Place 1 spray into the nose daily as needed for congestion.   Marland Kitchen Spacer/Aero-Holding Chambers (AEROCHAMBER MV) inhaler Use as instructed  . sucralfate (CARAFATE) 1 g tablet Take 1 tablet (1 g total) by mouth daily as needed (stomach coating).  . tamsulosin (FLOMAX) 0.4 MG CAPS capsule Take 0.4 mg by mouth daily after supper.  . zolpidem (AMBIEN) 10 MG tablet Take 1 tablet (10 mg total) by mouth at bedtime.  . [DISCONTINUED] methocarbamol (ROBAXIN)  500 MG tablet Take 1 tablet (500 mg total) by mouth every 6 (six) hours as needed for muscle spasms.   No facility-administered encounter medications on file as of 08/27/2018.     Allergies (verified) Penicillins   History: Past Medical History:  Diagnosis Date  . Arthritis   . Asthma   . Benign prostatic hypertrophy   . Carotid stenosis    Carotid US (07/2013):  Bilateral 1-39% ICA (f/u 2 yrs)  . Chest pain    a. 01/2013 Cath: LM nl, LAD 25p, D1 25p, LCX nl, RCA 25p, PDA nl, RPL nl, EF 55%;  b. 03/2015 MV: EF 59%, no ischemia; c. 03/2016 MV: basal inf/mid inf defect, likely artifact, EF 48%, low risk.  . Chronic Dyspnea on exertion    a. See cardiac eval under CAD heading;  b. 03/2015 Echo: EF 50-55%, gr1 DD, triv AI;  b. 03/2015 CTA Chest: No PE;  c. 05/2016 CPX: no significant cardi-pulmonary limitation noted. Suspect obesity contributing to exertional symptoms.  . Diabetes mellitus without complication ([place]) 7619  . Difficulty urinating   . Diverticulitis   . Diverticulosis   . Exogenous obesity   . GERD (gastroesophageal reflux disease)   . H/O hiatal hernia   . Hernia    Umbulical   . Hyperlipidemia   . Laceration  01/11/2009    Deep laceration left index finger dorsally  . Musculoskeletal pain    in the right shoulder - S/P ROTATOR CUFF REPAIR-BUT STILL HAS PAIN  . Obstructive sleep apnea     CPAP    AUTO SET 6 TO 20 - USUALLY SETTLES OUT AT 10  . Rectal bleeding    PT ATTRIBUTES TO HEMORRHOIDS  . SUPRAVENTRICULAR TACHYCARDIA 08/01/2009   Qualifier: Diagnosis of  By: Lovette Cliche, CNA, Christy    . Thyroiditis   . Ulcer 2008  . Walking pneumonia    Past Surgical History:  Procedure Laterality Date  . COLONOSCOPY     for polyps  . ELBOW BURSA SURGERY    . Eyelid Surgery Bilateral 05/2013  . INGUINAL HERNIA REPAIR    . KIDNEY STONE SURGERY    . KNEE SURGERY     meniscus right knee  . LAPAROSCOPIC SIGMOID COLECTOMY  07/23/2012   Procedure: LAPAROSCOPIC SIGMOID COLECTOMY;   Surgeon: [name], MD;  Location: [facility];  Service: General;  Laterality: N/A;  Laparoscopic Sigmoid Colectomy  . LEFT HEART CATHETERIZATION WITH CORONARY ANGIOGRAM N/A 02/22/2013   Procedure: LEFT HEART CATHETERIZATION WITH CORONARY ANGIOGRAM;  Surgeon: [name], MD;  Location: [facility] CATH LAB;  Service: Cardiovascular;  Laterality: N/A;  . PROCTOSCOPY  07/23/2012   Procedure: PROCTOSCOPY;  Surgeon: [name], MD;  Location: [facility];  Service: General;  Laterality: N/A;  Rigid Proctoscopy  . ROTATOR CUFF REPAIR    . TONSILLECTOMY    . TOTAL HIP ARTHROPLASTY Right 05/27/2018   Procedure: RIGHT TOTAL HIP ARTHROPLASTY ANTERIOR APPROACH;  Surgeon: [name], MD;  Location: [facility];  Service: Orthopedics;  Laterality:  Right;  157min  . UMBILICAL HERNIA REPAIR  07/23/2012   Procedure: HERNIA REPAIR UMBILICAL ADULT;  Surgeon: [name], MD;  Location: [facility];  Service: General;  Laterality: N/A;  Primary Umbilical Hernia Repair   Family History  Problem Relation Age of Onset  . Stroke Mother   . Heart disease Mother        Valvular heart disease, Rheumatic fever  . Lung cancer Maternal Uncle    Social History   Socioeconomic History  . Marital status: Married    Spouse name: Alice  . Number of children: 4  . Years of education: Not on file  . Highest education level: Associate degree: academic program  Occupational History  . Occupation: Retired  Scientific laboratory technician  . Financial resource strain: Not hard at all  . Food insecurity:    Worry: Never true    Inability: Never true  . Transportation needs:    Medical: No    Non-medical: No  Tobacco Use  . Smoking status: Former Smoker    Types: Cigars    Last attempt to quit: 08/12/1999    Years since quitting: 19.0  . Smokeless tobacco: Never Used  . Tobacco comment: smoked cigars on and off for approx 4-5 yrs  Substance and Sexual Activity  . Alcohol use: No  . Drug use: No  . Sexual activity: Yes  Lifestyle  .  Physical activity:    Days per week: 2 days    Minutes per session: 30 min  . Stress: Not at all  Relationships  . Social connections:    Talks on phone: More than three times a week    Gets together: Twice a week    Attends religious service: More than 4 times per year    Active member of club or organization: Yes    Attends meetings of clubs or organizations: More than 4 times per year    Relationship status: Married  Other Topics Concern  . Not on file  Social History Narrative   Lives at home with wife      [facility] (09/16/16):   Originally from [facility]. Previously served & lived in [place], [place], & [place]. 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 He also worked in [employer]. No pets currently. Remote bird exposure. No mold or hot tub exposure. Enjoys playing banjo as well as hunting. He also raises cattle. Has carpet in his bedroom. No feather bedding. No indoor plants.      Clinical Intake:  Pre-visit preparation completed: Yes  Pain : 0-10 Pain Score: 1  Pain Type: (P) Chronic pain Pain Location: Hip Pain Orientation: Right Pain Descriptors / Indicators: Constant Pain Onset: More than a month ago Pain Frequency: Constant Pain Relieving Factors: positional changes, tylenol or ibuprofen Effect of Pain on Daily Activities: limits time spent walking   Pain Relieving Factors: positional changes, tylenol or ibuprofen  BMI - recorded: 35.79 Nutritional Status: BMI > 30  Obese Diabetes: Yes  How often do you need to have someone help you when you read instructions, pamphlets, or other written materials from your doctor or pharmacy?: 1 - Never What is the last grade level you completed in school?: 12th  Interpreter Needed?: No  Information entered by :: Otilio Connors, LPN   Activities of Daily Living In your present state of health, do you have any difficulty performing the following activities:  08/27/2018  05/27/2018  Hearing? Y N  Comment he wears hearing aids  -  Vision? N -  Difficulty concentrating or making decisions? N -  Walking or climbing stairs? Y -  Comment due to his recent right hip replacement -  Dressing or bathing? N -  Doing errands, shopping? N -  Preparing Food and eating ? N -  Using the Toilet? N -  In the past six months, have you accidently leaked urine? N -  Do you have problems with loss of bowel control? N -  Managing your Medications? N -  Managing your Finances? N -  Housekeeping or managing your Housekeeping? N -  Some recent data might be hidden     Exercise Current Exercise Habits: Home exercise routine, Type of exercise: walking;Other - see comments(stationary bike), Time (Minutes): 30, Frequency (Times/Week): 2, Weekly Exercise (Minutes/Week): 60, Intensity: Mild, Exercise limited by: orthopedic condition(s)  Diet Consumes 3 meals a day and 0 snacks a day.  The patient feels that he mostly follow a Regular diet.  Diet History Patient describes a diet of mostly vegetables and fruits, and lean proteins.  He has recently decreased consumption of red meat. He has access to all the food he needs.   Depression Screen PHQ 2/9 Scores 08/27/2018 07/24/2018 04/29/2018 03/09/2018 10/30/2017 07/21/2017 05/30/2017  PHQ - 2 Score 0 0 0 0 0 0 0  PHQ- 9 Score - - - - - - -     Fall Risk Fall Risk  08/27/2018 07/24/2018 04/29/2018 03/09/2018 10/30/2017  Falls in the past year? 1 0 No No Yes  Number falls in past yr: 1 - - - 2 or more  Injury with Fall? 0 - - - No  Comment - - - - -  Risk for fall due to : Impaired mobility - - - -  Follow up Education provided;Falls prevention discussed - - - -     Objective:    Today's Vitals   08/27/18 1408 08/27/18 1410 08/27/18 1418  BP: 115/66    Pulse: 64    Weight: 271 lb 4 oz (123 kg)    Height: 6\' 1"  (1.854 m)    PainSc:  (P) 1  1    Body mass index is 35.79 kg/m.  Advanced Directives 08/27/2018  05/27/2018 05/19/2018 05/30/2017 03/18/2015 10/13/2014 02/22/2013  Does Patient Have a Medical Advance Directive? No No No No No No Patient does not have advance directive;Patient would not like information  Would patient like information on creating a medical advance directive? No - Patient declined No - Patient declined No - Patient declined - - - -  Pre-existing out of facility DNR order (yellow form or pink MOST form) - - - - - - No    Hearing/Vision  No hearing or vision deficits noted during visit. Patient wears hearing aids and glasses.   Cognitive Function: MMSE - Mini Mental State Exam 08/27/2018 05/30/2017 10/13/2014  Orientation to time 5 5 5   Orientation to Place 5 5 5   Registration 3 3 3   Attention/ Calculation 5 5 5   Recall 2 3 3   Language- name 2 objects 2 2 2   Language- repeat 1 1 1   Language- follow 3 step command 3 3 3   Language- read & follow direction 1 1 1   Write a sentence 1 1 1   Copy design 1 1 1   Total score 29 30 30            Immunizations and Health Maintenance Immunization History  Administered Date(s) Administered  . Influenza Split 03/31/2012  . Influenza, High Dose Seasonal PF 04/11/2017, 04/08/2018  . Influenza,inj,Quad PF,6+ Mos 03/19/2013, 03/27/2015  . Influenza-Unspecified 02/28/2014, 03/01/2016  . Pneumococcal Conjugate-13 07/08/2013  . Pneumococcal Polysaccharide-23 03/31/2005  . Tdap 04/01/2011   Health Maintenance Due  Topic Date Due  . OPHTHALMOLOGY EXAM  08/01/2017    Patient reports that he had eye exam at [facility] in [place], [place]- records requested.    Health Maintenance  Topic Date Due  . OPHTHALMOLOGY EXAM  08/01/2017  . HEMOGLOBIN A1C  01/22/2019  . FOOT EXAM  03/10/2019  . URINE MICROALBUMIN  03/10/2019  . TETANUS/TDAP  03/31/2021  . INFLUENZA VACCINE  Completed  . PNA vac Low Risk Adult  Completed        Assessment:   This is a routine wellness examination for [place].    Plan:    Goals    . DIET - EAT MORE  FRUITS AND VEGETABLES    . Exercise 3x per week (30 min per time)        Health Maintenance & Additional Screening Recommendations: Advanced directives: has NO advanced directive - not interested in additional information  Lung: Low Dose CT Chest recommended if Age 71-80 years, 30 pack-year currently smoking OR have quit w/in 15years. Patient does not qualify. Hepatitis C Screening recommended: not applicable    Keep f/u with Chipper Herb, MD and any other specialty appointments you may have Continue current medications Move carefully to avoid falls. Use assistive devices like a cane or walker if needed. Aim for at least 150 minutes of moderate activity a week.  Read or work on puzzles daily Stay connected with friends and family  I have personally reviewed and noted the following in the patient's chart:   . Medical and social history . Use of alcohol, tobacco or illicit drugs  . Current medications and supplements . Functional ability and status . Nutritional status . Physical activity . Advanced directives . List of other physicians . Hospitalizations, surgeries, and ER visits in previous 12 months . Vitals . Screenings to include cognitive, depression, and falls . Referrals and appointments  In addition, I have reviewed and discussed with patient certain preventive protocols, quality metrics, and best practice recommendations. A written personalized care plan for preventive services as well as general preventive health recommendations were provided to patient.     Otilio Connors, LPN  8/92/1194   I have reviewed and agree with the above AWV documentation.   Mary-Margaret Hassell Done, FNP

## 2018-08-27 NOTE — Patient Instructions (Signed)
Preventive Care 2 Years and Older, Male Preventive care refers to lifestyle choices and visits with your health care provider that can promote health and wellness. What does preventive care include?   A yearly physical exam. This is also called an annual well check.  Dental exams once or twice a year.  Routine eye exams. Ask your health care provider how often you should have your eyes checked.  Personal lifestyle choices, including: ? Daily care of your teeth and gums. ? Regular physical activity. ? Eating a healthy diet. ? Avoiding tobacco and drug use. ? Limiting alcohol use. ? Practicing safe sex. ? Taking low doses of aspirin every day. ? Taking vitamin and mineral supplements as recommended by your health care provider. What happens during an annual well check? The services and screenings done by your health care provider during your annual well check will depend on your age, overall health, lifestyle risk factors, and family history of disease. Counseling Your health care provider may ask you questions about your:  Alcohol use.  Tobacco use.  Drug use.  Emotional well-being.  Home and relationship well-being.  Sexual activity.  Eating habits.  History of falls.  Memory and ability to understand (cognition).  Work and work Statistician. Screening You may have the following tests or measurements:  Height, weight, and BMI.  Blood pressure.  Lipid and cholesterol levels. These may be checked every 5 years, or more frequently if you are over 9 years old.  Skin check.  Lung cancer screening. You may have this screening every year starting at age 57 if you have a 30-pack-year history of smoking and currently smoke or have quit within the past 15 years.  Colorectal cancer screening. All adults should have this screening starting at age 90 and continuing until age 69. You will have tests every 1-10 years, depending on your results and the type of screening  test. People at increased risk should start screening at an earlier age. Screening tests may include: ? Guaiac-based fecal occult blood testing. ? Fecal immunochemical test (FIT). ? Stool DNA test. ? Virtual colonoscopy. ? Sigmoidoscopy. During this test, a flexible tube with a tiny camera (sigmoidoscope) is used to examine your rectum and lower colon. The sigmoidoscope is inserted through your anus into your rectum and lower colon. ? Colonoscopy. During this test, a long, thin, flexible tube with a tiny camera (colonoscope) is used to examine your entire colon and rectum.  Prostate cancer screening. Recommendations will vary depending on your family history and other risks.  Hepatitis C blood test.  Hepatitis B blood test.  Sexually transmitted disease (STD) testing.  Diabetes screening. This is done by checking your blood sugar (glucose) after you have not eaten for a while (fasting). You may have this done every 1-3 years.  Abdominal aortic aneurysm (AAA) screening. You may need this if you are a current or former smoker.  Osteoporosis. You may be screened starting at age 30 if you are at high risk. Talk with your health care provider about your test results, treatment options, and if necessary, the need for more tests. Vaccines Your health care provider may recommend certain vaccines, such as:  Influenza vaccine. This is recommended every year.  Tetanus, diphtheria, and acellular pertussis (Tdap, Td) vaccine. You may need a Td booster every 10 years.  Varicella vaccine. You may need this if you have not been vaccinated.  Zoster vaccine. You may need this after age 42.  Measles, mumps, and rubella (MMR) vaccine.  You may need at least one dose of MMR if you were born in 1957 or later. You may also need a second dose.  Pneumococcal 13-valent conjugate (PCV13) vaccine. One dose is recommended after age 65.  Pneumococcal polysaccharide (PPSV23) vaccine. One dose is recommended  after age 65.  Meningococcal vaccine. You may need this if you have certain conditions.  Hepatitis A vaccine. You may need this if you have certain conditions or if you travel or work in places where you may be exposed to hepatitis A.  Hepatitis B vaccine. You may need this if you have certain conditions or if you travel or work in places where you may be exposed to hepatitis B.  Haemophilus influenzae type b (Hib) vaccine. You may need this if you have certain risk factors. Talk to your health care provider about which screenings and vaccines you need and how often you need them. This information is not intended to replace advice given to you by your health care provider. Make sure you discuss any questions you have with your health care provider. Document Released: 07/14/2015 Document Revised: 08/07/2017 Document Reviewed: 04/18/2015 Elsevier Interactive Patient Education  2019 Elsevier Inc.  

## 2018-09-09 ENCOUNTER — Other Ambulatory Visit: Payer: Medicare HMO

## 2018-09-09 ENCOUNTER — Other Ambulatory Visit: Payer: Self-pay

## 2018-09-09 DIAGNOSIS — Z1211 Encounter for screening for malignant neoplasm of colon: Secondary | ICD-10-CM | POA: Diagnosis not present

## 2018-09-10 LAB — FECAL OCCULT BLOOD, IMMUNOCHEMICAL: Fecal Occult Bld: NEGATIVE

## 2018-09-28 ENCOUNTER — Telehealth: Payer: Self-pay | Admitting: Family Medicine

## 2018-09-28 NOTE — Telephone Encounter (Signed)
Spoke with pt and advised him that rx was sent to [facility] 90 day supply with 1 refill on 07/24/18. Pt states he will call [facility] and have them mail the Ambien.

## 2018-09-28 NOTE — Telephone Encounter (Signed)
Pt wants to know if his Lorrin Mais rx has been called in to mail order? Stew from [place] gave him this information. Please call back

## 2018-09-28 NOTE — Telephone Encounter (Signed)
What is the name of the medication? zolpidem (AMBIEN) 10 MG tablet  Have you contacted your pharmacy to request a refill? Pt states that he called  Which pharmacy would you like this sent to? [facility]   Patient notified that their request is being sent to the clinical staff for review and that they should receive a call once it is complete. If they do not receive a call within 24 hours they can check with their pharmacy or our office.

## 2018-10-01 ENCOUNTER — Other Ambulatory Visit: Payer: Self-pay | Admitting: Family Medicine

## 2018-10-05 ENCOUNTER — Other Ambulatory Visit: Payer: Self-pay | Admitting: Family Medicine

## 2018-11-12 ENCOUNTER — Other Ambulatory Visit: Payer: Self-pay | Admitting: Family Medicine

## 2018-12-10 ENCOUNTER — Other Ambulatory Visit: Payer: Self-pay

## 2018-12-10 ENCOUNTER — Telehealth: Payer: Self-pay | Admitting: Family Medicine

## 2018-12-10 ENCOUNTER — Ambulatory Visit (INDEPENDENT_AMBULATORY_CARE_PROVIDER_SITE_OTHER): Payer: Medicare HMO | Admitting: Family Medicine

## 2018-12-10 ENCOUNTER — Encounter: Payer: Self-pay | Admitting: Family Medicine

## 2018-12-10 DIAGNOSIS — M79605 Pain in left leg: Secondary | ICD-10-CM | POA: Insufficient documentation

## 2018-12-10 DIAGNOSIS — I6529 Occlusion and stenosis of unspecified carotid artery: Secondary | ICD-10-CM | POA: Diagnosis not present

## 2018-12-10 DIAGNOSIS — M79672 Pain in left foot: Secondary | ICD-10-CM

## 2018-12-10 DIAGNOSIS — M79671 Pain in right foot: Secondary | ICD-10-CM

## 2018-12-10 DIAGNOSIS — G4733 Obstructive sleep apnea (adult) (pediatric): Secondary | ICD-10-CM

## 2018-12-10 DIAGNOSIS — K219 Gastro-esophageal reflux disease without esophagitis: Secondary | ICD-10-CM | POA: Diagnosis not present

## 2018-12-10 DIAGNOSIS — I700 Atherosclerosis of aorta: Secondary | ICD-10-CM

## 2018-12-10 DIAGNOSIS — I10 Essential (primary) hypertension: Secondary | ICD-10-CM

## 2018-12-10 DIAGNOSIS — E559 Vitamin D deficiency, unspecified: Secondary | ICD-10-CM

## 2018-12-10 DIAGNOSIS — J439 Emphysema, unspecified: Secondary | ICD-10-CM

## 2018-12-10 DIAGNOSIS — E118 Type 2 diabetes mellitus with unspecified complications: Secondary | ICD-10-CM | POA: Diagnosis not present

## 2018-12-10 DIAGNOSIS — N400 Enlarged prostate without lower urinary tract symptoms: Secondary | ICD-10-CM

## 2018-12-10 DIAGNOSIS — M79641 Pain in right hand: Secondary | ICD-10-CM | POA: Insufficient documentation

## 2018-12-10 DIAGNOSIS — M79642 Pain in left hand: Secondary | ICD-10-CM

## 2018-12-10 DIAGNOSIS — Z1211 Encounter for screening for malignant neoplasm of colon: Secondary | ICD-10-CM

## 2018-12-10 DIAGNOSIS — E669 Obesity, unspecified: Secondary | ICD-10-CM | POA: Diagnosis not present

## 2018-12-10 DIAGNOSIS — M79604 Pain in right leg: Secondary | ICD-10-CM

## 2018-12-10 NOTE — Telephone Encounter (Signed)
had visit with Cristian Moore this morning, when does he need to come in and have labs done

## 2018-12-10 NOTE — Telephone Encounter (Signed)
Patient aware to come in have labs drawn

## 2018-12-10 NOTE — Progress Notes (Signed)
Virtual Visit Via telephone Note I connected with@ on 12/10/18 by telephone and verified that I am speaking with the correct person or authorized healthcare agent using two identifiers. Cristian Moore is currently located at home and there are no unauthorized people in close proximity. I completed this visit while in a private location in my home .  This visit type was conducted due to national recommendations for restrictions regarding the COVID-19 Pandemic (e.g. social distancing).  This format is felt to be most appropriate for this patient at this time.  All issues noted in this document were discussed and addressed.  No physical exam was performed.    I discussed the limitations, risks, security and privacy concerns of performing an evaluation and management service by telephone and the availability of in person appointments. I also discussed with the patient that there may be a patient responsible charge related to this service. The patient expressed understanding and agreed to proceed.   Date:  12/10/2018    ID:  Cristian Moore      1941/03/14        [id number]   Patient Care Team Patient Care Team: Chipper Herb, MD as PCP - General (Family Medicine) [name], MD as PCP - Cardiology (Cardiology) Gatha Mayer, MD as Consulting Physician (Gastroenterology) [name], MD as Consulting Physician (Cardiology) Anda Kraft, MD as Consulting Physician (Endocrinology) Tanda Rockers, MD as Consulting Physician (Pulmonary Disease) Netta Cedars, MD as Consulting Physician (Orthopedic Surgery) Suella Broad, MD as Consulting Physician (Physical Medicine and Rehabilitation)  Reason for Visit: Primary Care Follow-up     History of Present Illness & Review of Systems:     Cristian Moore is a 78 y.o. year old male primary care patient that presents today for a telehealth visit.  The patient is alert and overall is feeling well.  He does have some complaints.   He has been having some increased edema in his hands and tightness in his hands and pain with the right hand being worse than the left.  He is also been complaining of some hurting in his legs at night below the knees.  He does not indicate or give a history of any injury.  He is due to get blood work and we will add an arthritis panel plus an anti-CCP to the blood work that is going to be done and the nurse will make sure that blood work he has had from the [place] will not be duplicated with the blood work we do in the office.  He denies any chest pain.  He only has occasional shortness of breath and this is relieved when he uses his albuterol inhaler.  He used this yesterday.  Generally speaking when he is working outside mowing the yard he uses a mask to protect his airways.  He also has had some loose bowel movements a couple times weekly and he has told the people at the [place] this and they are planning to do a colonoscopy.  He also has occasional heartburn and we recommended him taking some Pepcid AC twice a day before breakfast and supper for about 4 weeks to see if this helps the heartburn and then take it as needed after that.  Otherwise he denies any blood in the stool nausea or vomiting.  He is passing his water well.  Review of systems as stated, otherwise negative.  The patient does not have symptoms concerning for COVID-19 infection (fever,  chills, cough, or new shortness of breath).      Current Medications (Verified) Allergies as of 12/10/2018      Reactions   Penicillins Rash   Over 60 years ago Has patient had a PCN reaction causing immediate rash, facial/tongue/throat swelling, SOB or lightheadedness with hypotension: Unknown Has patient had a PCN reaction causing severe rash involving mucus membranes or skin necrosis: Unknown Has patient had a PCN reaction that required hospitalization: No Has patient had a PCN reaction occurring within the last 10 years: No If all of the above answers  are "NO", then may proceed with Cephalosporin use.      Medication List       Accurate as of December 10, 2018  8:49 AM. If you have any questions, ask your nurse or doctor.        acetaminophen 500 MG tablet Commonly known as: TYLENOL Take 1,000 mg by mouth every 6 (six) hours as needed for moderate pain.   AeroChamber MV inhaler Use as instructed   albuterol 108 (90 Base) MCG/ACT inhaler Commonly known as: VENTOLIN HFA Inhale 2 puffs into the lungs every 6 (six) hours as needed for wheezing or shortness of breath.   CENTRUM SILVER PO Take 1 tablet by mouth daily.   Co Q-10 100 MG Caps Take 100 mg by mouth daily.   escitalopram 10 MG tablet Commonly known as: LEXAPRO Take 1 tablet (10 mg total) by mouth daily. as directed   fluticasone 50 MCG/ACT nasal spray Commonly known as: FLONASE USE 2 SPRAYS IN EACH       NOSTRIL DAILY   hydroxypropyl methylcellulose / hypromellose 2.5 % ophthalmic solution Commonly known as: ISOPTO TEARS / GONIOVISC Place 1 drop into both eyes daily as needed for dry eyes.   metFORMIN 500 MG 24 hr tablet Commonly known as: GLUCOPHAGE-XR TAKE 1 TABLET DAILY WITH   BREAKFAST   metoprolol tartrate 50 MG tablet Commonly known as: LOPRESSOR Take 0.5 tablets (25 mg total) by mouth 2 (two) times daily.   montelukast 10 MG tablet Commonly known as: SINGULAIR TAKE 1 TABLET AT BEDTIME   nitroGLYCERIN 0.4 MG SL tablet Commonly known as: Nitrostat DISSOLVE ONE TABLET UNDER THE TONGUE EVERY 5 MINUTES AS NEEDED FOR CHEST PAIN.  DO NOT EXCEED A TOTAL OF 3 DOSES IN 15 MINUTES What changed:   Moore much to take  Moore to take this  when to take this  reasons to take this   OneTouch Delica Lancets 99I Misc Check BS BID and PRN. DX. E11.8   OneTouch Verio test strip Generic drug: glucose blood USE TO CHECK BLOOD SUGAR TWICE DAILY AND AS NEEDED   PRESCRIPTION MEDICATION Inhale 1 application into the lungs at bedtime. "CPAP" MACHINE   simvastatin  20 MG tablet Commonly known as: ZOCOR Take 1 tablet (20 mg total) by mouth at bedtime.   sodium chloride 0.65 % nasal spray Commonly known as: OCEAN Place 1 spray into the nose daily as needed for congestion.   sucralfate 1 g tablet Commonly known as: CARAFATE Take 1 tablet (1 g total) by mouth daily as needed (stomach coating).   tamsulosin 0.4 MG Caps capsule Commonly known as: FLOMAX Take 0.4 mg by mouth daily after supper.   Vitamin D 50 MCG (2000 UT) tablet Take 2,000 Units by mouth daily.   zolpidem 10 MG tablet Commonly known as: AMBIEN Take 1 tablet (10 mg total) by mouth at bedtime.  Allergies (Verified)    Penicillins  Past Medical History Past Medical History:  Diagnosis Date   Arthritis    Asthma    Benign prostatic hypertrophy    Carotid stenosis    Carotid US (07/2013):  Bilateral 1-39% ICA (f/u 2 yrs)   Chest pain    a. 01/2013 Cath: LM nl, LAD 25p, D1 25p, LCX nl, RCA 25p, PDA nl, RPL nl, EF 55%;  b. 03/2015 MV: EF 59%, no ischemia; c. 03/2016 MV: basal inf/mid inf defect, likely artifact, EF 48%, low risk.   Chronic Dyspnea on exertion    a. See cardiac eval under CAD heading;  b. 03/2015 Echo: EF 50-55%, gr1 DD, triv AI;  b. 03/2015 CTA Chest: No PE;  c. 05/2016 CPX: no significant cardi-pulmonary limitation noted. Suspect obesity contributing to exertional symptoms.   Diabetes mellitus without complication ([place]) 4656   Difficulty urinating    Diverticulitis    Diverticulosis    Exogenous obesity    GERD (gastroesophageal reflux disease)    H/O hiatal hernia    Hernia    Umbulical    Hyperlipidemia    Laceration  01/11/2009    Deep laceration left index finger dorsally   Musculoskeletal pain    in the right shoulder - S/P ROTATOR CUFF REPAIR-BUT STILL HAS PAIN   Obstructive sleep apnea     CPAP    AUTO SET 6 TO 20 - USUALLY SETTLES OUT AT 10   Rectal bleeding    PT ATTRIBUTES TO HEMORRHOIDS   SUPRAVENTRICULAR  TACHYCARDIA 08/01/2009   Qualifier: Diagnosis of  By: Lovette Cliche, CNA, Christy     Thyroiditis    Ulcer 2008   Walking pneumonia      Past Surgical History:  Procedure Laterality Date   COLONOSCOPY     for polyps   ELBOW BURSA SURGERY     Eyelid Surgery Bilateral 05/2013   INGUINAL HERNIA REPAIR     KIDNEY STONE SURGERY     KNEE SURGERY     meniscus right knee   LAPAROSCOPIC SIGMOID COLECTOMY  07/23/2012   Procedure: LAPAROSCOPIC SIGMOID COLECTOMY;  Surgeon: [name], MD;  Location: [facility];  Service: General;  Laterality: N/A;  Laparoscopic Sigmoid Colectomy   LEFT HEART CATHETERIZATION WITH CORONARY ANGIOGRAM N/A 02/22/2013   Procedure: LEFT HEART CATHETERIZATION WITH CORONARY ANGIOGRAM;  Surgeon: [name], MD;  Location: [facility] CATH LAB;  Service: Cardiovascular;  Laterality: N/A;   PROCTOSCOPY  07/23/2012   Procedure: PROCTOSCOPY;  Surgeon: [name], MD;  Location: [facility];  Service: General;  Laterality: N/A;  Rigid Proctoscopy   ROTATOR CUFF REPAIR     TONSILLECTOMY     TOTAL HIP ARTHROPLASTY Right 05/27/2018   Procedure: RIGHT TOTAL HIP ARTHROPLASTY ANTERIOR APPROACH;  Surgeon: [name], MD;  Location: [facility];  Service: Orthopedics;  Laterality: Right;  [id number]   UMBILICAL HERNIA REPAIR  07/23/2012   Procedure: HERNIA REPAIR UMBILICAL ADULT;  Surgeon: [name], MD;  Location: [facility];  Service: General;  Laterality: N/A;  Primary Umbilical Hernia Repair    Social History   Socioeconomic History   Marital status: Married    Spouse name: Alice   Number of children: 4   Years of education: Not on file   Highest education level: Associate degree: academic program  Occupational History   Occupation: Retired  Scientist, product/process development strain: Not hard at [employer] insecurity    Worry: Never true  Inability: Never true   Transportation needs    Medical: No    Non-medical: No  Tobacco Use   Smoking status: Former Smoker     Types: Cigars    Quit date: 08/12/1999    Years since quitting: 19.3   Smokeless tobacco: Never Used   Tobacco comment: smoked cigars on and off for approx 4-5 yrs  Substance and Sexual Activity   Alcohol use: No   Drug use: No   Sexual activity: Yes  Lifestyle   Physical activity    Days per week: 2 days    Minutes per session: 30 min   Stress: Not at all  Relationships   Social connections    Talks on phone: More than three times a week    Gets together: Twice a week    Attends religious service: More than 4 times per year    Active member of club or organization: Yes    Attends meetings of clubs or organizations: More than 4 times per year    Relationship status: Married  Other Topics Concern   Not on file  Social History Narrative   Lives at home with wife      [facility] (09/16/16):   Originally from [facility] [place] [facility]. Previously served & lived in [place], [place], & [place]. 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 He also worked in [employer]. No pets currently. Remote bird exposure. No mold or hot tub exposure. Enjoys playing banjo as well as hunting. He also raises cattle. Has carpet in his bedroom. No feather bedding. No indoor plants.      Family History  Problem Relation Age of Onset   Stroke Mother    Heart disease Mother        Valvular heart disease, Rheumatic fever   Lung cancer Maternal Uncle       Labs/Other Tests and Data Reviewed:    Wt Readings from Last 3 Encounters:  08/27/18 271 lb 4 oz (123 kg)  07/24/18 270 lb (122.5 kg)  05/27/18 264 lb (119.7 kg)   Temp Readings from Last 3 Encounters:  07/24/18 (!) 97.1 F (36.2 C) (Oral)  05/28/18 98 F (36.7 C) (Oral)  05/19/18 98.7 F (37.1 C) (Oral)   BP Readings from Last 3 Encounters:  08/27/18 115/66  07/24/18 121/74  05/28/18 (!) 121/51   Pulse Readings from Last 3 Encounters:  08/27/18 64  07/24/18 75  05/28/18 71      Lab Results  Component Value Date   HGBA1C 5.6 07/24/2018   HGBA1C 5.9 (H) 05/19/2018   HGBA1C 5.8 10/30/2017   Lab Results  Component Value Date   MICROALBUR negative 04/12/2015   LDLCALC 79 07/24/2018   CREATININE 1.14 07/24/2018       Chemistry      Component Value Date/Time   NA 140 07/24/2018 1041   K 4.6 07/24/2018 1041   CL 97 07/24/2018 1041   CO2 24 07/24/2018 1041   BUN 16 07/24/2018 1041   CREATININE 1.14 07/24/2018 1041      Component Value Date/Time   CALCIUM 10.0 07/24/2018 1041   ALKPHOS 53 07/24/2018 1041   AST 14 07/24/2018 1041   ALT 14 07/24/2018 1041   BILITOT 0.4 07/24/2018 1041         OBSERVATIONS/ OBJECTIVE:     The patient is alert.  He does have diabetes and obesity.  He says that his  feet are okay with no signs of infections or sores.  His temperature this morning is 97.4.  His blood pressure generally runs in the 120s over the 60s but this morning was 134/68.  His pulse was 63.  His weight is 266.6.  His blood sugars generally run around 1 12-1 20 fasting but this morning was 136.  [facility] of some [facility].  Because of the hand edema and the leg pain we will make sure we add an arthritis panel to the blood work that he gets done in the office.  Physical exam deferred due to nature of telephonic visit.  ASSESSMENT & PLAN    Time:   Today, I have spent 29 minutes with the patient via telephone discussing the above including Covid precautions.     Visit Diagnoses: 1. Stenosis of carotid artery, unspecified laterality -Follow-up with cardiology as planned  2. Essential hypertension -Continue with current treatment and monitor blood pressures regularly and make every effort to keep the blood pressure down to 120/80 or less.  We reemphasized the importance of watching sodium intake and reducing weight as much as possible with exercise and diet  3. Aortic  atherosclerosis (HCC) -Continue statin therapy and aggressive therapeutic lifestyle changes  4. Obstructive sleep apnea -Continue with CPAP  5. Pulmonary emphysema, unspecified emphysema type ([place]) -Use inhaler as needed and follow-up with pulmonologist if needed.  6. Gastroesophageal reflux disease, esophagitis presence not specified -Patient is having a little more trouble with reflux and I encouraged him to take Pepcid AC twice daily before breakfast and supper for at least a month and then as needed.  He should discussed the fact that he is having the increased reflux symptoms and a gastroenterologist at the [facility] may want to consider doing an endoscopy at the same time.  7. DM (diabetes mellitus), type 2 with complications ([place]) -Continue with current treatment and aggressive therapeutic lifestyle changes to achieve weight loss through diet and exercise  8. Benign prostatic hyperplasia, unspecified whether lower urinary tract symptoms present -Patient will need rectal exam yearly.  9. Vitamin D deficiency -Continue with vitamin D replacement pending results of lab work  10. Obesity (BMI 30-39.9) -Continue to work aggressively on weight loss.  11.  Hand pain and swelling -Check arthritis panel  12.  Leg pain -Check arthritis panel   The above assessment and management plan was discussed with the patient. The patient verbalized understanding of and has agreed to the management plan. Patient is aware to call the clinic if symptoms persist or worsen. Patient is aware when to return to the clinic for a follow-up visit. Patient educated on when it is appropriate to go to the emergency department.    Chipper Herb, MD [address], [place], [place] [address] Ph [phone]   Arrie Senate MD

## 2018-12-10 NOTE — Patient Instructions (Signed)
Continue to work aggressively on weight with diet and exercise Continue to drink plenty of water and fluids Follow-up with cardiology as recommended Always be careful and do not put self at risk for falling Avoid climbing if possible Continue to practice good hand and respiratory hygiene

## 2018-12-10 NOTE — Addendum Note (Signed)
Addended by: Zannie Cove on: 12/10/2018 01:46 PM   Modules accepted: Orders

## 2018-12-11 ENCOUNTER — Other Ambulatory Visit: Payer: Self-pay

## 2018-12-11 ENCOUNTER — Other Ambulatory Visit: Payer: Medicare HMO

## 2018-12-11 DIAGNOSIS — I6529 Occlusion and stenosis of unspecified carotid artery: Secondary | ICD-10-CM | POA: Diagnosis not present

## 2018-12-11 DIAGNOSIS — M79604 Pain in right leg: Secondary | ICD-10-CM

## 2018-12-11 DIAGNOSIS — M79642 Pain in left hand: Secondary | ICD-10-CM

## 2018-12-11 DIAGNOSIS — I10 Essential (primary) hypertension: Secondary | ICD-10-CM | POA: Diagnosis not present

## 2018-12-11 DIAGNOSIS — M79605 Pain in left leg: Secondary | ICD-10-CM | POA: Diagnosis not present

## 2018-12-11 DIAGNOSIS — E559 Vitamin D deficiency, unspecified: Secondary | ICD-10-CM

## 2018-12-11 DIAGNOSIS — M79641 Pain in right hand: Secondary | ICD-10-CM

## 2018-12-11 DIAGNOSIS — K219 Gastro-esophageal reflux disease without esophagitis: Secondary | ICD-10-CM

## 2018-12-11 DIAGNOSIS — E118 Type 2 diabetes mellitus with unspecified complications: Secondary | ICD-10-CM | POA: Diagnosis not present

## 2018-12-11 DIAGNOSIS — M79671 Pain in right foot: Secondary | ICD-10-CM | POA: Diagnosis not present

## 2018-12-11 DIAGNOSIS — J439 Emphysema, unspecified: Secondary | ICD-10-CM

## 2018-12-11 DIAGNOSIS — E669 Obesity, unspecified: Secondary | ICD-10-CM

## 2018-12-11 DIAGNOSIS — G4733 Obstructive sleep apnea (adult) (pediatric): Secondary | ICD-10-CM

## 2018-12-11 DIAGNOSIS — N400 Enlarged prostate without lower urinary tract symptoms: Secondary | ICD-10-CM

## 2018-12-11 DIAGNOSIS — I700 Atherosclerosis of aorta: Secondary | ICD-10-CM | POA: Diagnosis not present

## 2018-12-11 DIAGNOSIS — M79672 Pain in left foot: Secondary | ICD-10-CM | POA: Diagnosis not present

## 2018-12-11 LAB — ARTHRITIS PANEL

## 2018-12-11 LAB — BAYER DCA HB A1C WAIVED: HB A1C (BAYER DCA - WAIVED): 5.6 % (ref <7.0)

## 2018-12-11 LAB — LIPID PANEL

## 2018-12-12 LAB — BMP8+EGFR
BUN/Creatinine Ratio: 15 (ref 10–24)
BUN: 18 mg/dL (ref 8–27)
CO2: 21 mmol/L (ref 20–29)
Calcium: 10 mg/dL (ref 8.6–10.2)
Chloride: 100 mmol/L (ref 96–106)
Creatinine, Ser: 1.21 mg/dL (ref 0.76–1.27)
GFR calc Af Amer: 66 mL/min/{1.73_m2} (ref >59)
GFR calc non Af Amer: 57 mL/min/{1.73_m2} — ABNORMAL LOW (ref >59)
Glucose: 127 mg/dL — ABNORMAL HIGH (ref 65–99)
Potassium: 4.5 mmol/L (ref 3.5–5.2)
Sodium: 138 mmol/L (ref 134–144)

## 2018-12-12 LAB — LIPID PANEL
Chol/HDL Ratio: 3.2 ratio (ref 0.0–5.0)
Cholesterol, Total: 136 mg/dL (ref 100–199)
HDL: 43 mg/dL (ref >39)
LDL Calculated: 48 mg/dL (ref 0–99)
Triglycerides: 224 mg/dL — ABNORMAL HIGH (ref 0–149)
VLDL Cholesterol Cal: 45 mg/dL — ABNORMAL HIGH (ref 5–40)

## 2018-12-12 LAB — THYROID PANEL WITH TSH
Free Thyroxine Index: 1.1 — ABNORMAL LOW (ref 1.2–4.9)
T3 Uptake Ratio: 23 % — ABNORMAL LOW (ref 24–39)
T4, Total: 4.9 ug/dL (ref 4.5–12.0)
TSH: 0.577 u[IU]/mL (ref 0.450–4.500)

## 2018-12-12 LAB — ARTHRITIS PANEL
Basophils Absolute: 0.1 10*3/uL (ref 0.0–0.2)
Basos: 2 %
EOS (ABSOLUTE): 0.2 10*3/uL (ref 0.0–0.4)
Eos: 4 %
Hematocrit: 45.5 % (ref 37.5–51.0)
Hemoglobin: 14.5 g/dL (ref 13.0–17.7)
Immature Grans (Abs): 0 10*3/uL (ref 0.0–0.1)
Immature Granulocytes: 0 %
Lymphocytes Absolute: 1.9 10*3/uL (ref 0.7–3.1)
Lymphs: 28 %
MCH: 31.5 pg (ref 26.6–33.0)
MCHC: 31.9 g/dL (ref 31.5–35.7)
MCV: 99 fL — ABNORMAL HIGH (ref 79–97)
Monocytes Absolute: 0.7 10*3/uL (ref 0.1–0.9)
Monocytes: 11 %
Neutrophils Absolute: 3.7 10*3/uL (ref 1.4–7.0)
Neutrophils: 55 %
Platelets: 299 10*3/uL (ref 150–450)
RBC: 4.61 x10E6/uL (ref 4.14–5.80)
RDW: 13.1 % (ref 11.6–15.4)
Rheumatoid fact SerPl-aCnc: <10 IU/mL (ref 0.0–13.9)
Sed Rate: 15 mm/hr (ref 0–30)
Uric Acid: 5.8 mg/dL (ref 3.7–8.6)
WBC: 6.7 10*3/uL (ref 3.4–10.8)

## 2018-12-12 LAB — CYCLIC CITRUL PEPTIDE ANTIBODY, IGG/IGA: Cyclic Citrullin Peptide Ab: 6 units (ref 0–19)

## 2018-12-12 LAB — HEPATIC FUNCTION PANEL
ALT: 16 IU/L (ref 0–44)
AST: 18 IU/L (ref 0–40)
Albumin: 4.3 g/dL (ref 3.7–4.7)
Alkaline Phosphatase: 42 IU/L (ref 39–117)
Bilirubin Total: 0.3 mg/dL (ref 0.0–1.2)
Bilirubin, Direct: 0.09 mg/dL (ref 0.00–0.40)
Total Protein: 6.6 g/dL (ref 6.0–8.5)

## 2018-12-12 LAB — VITAMIN D 25 HYDROXY (VIT D DEFICIENCY, FRACTURES): Vit D, 25-Hydroxy: 48.3 ng/mL (ref 30.0–100.0)

## 2018-12-18 ENCOUNTER — Telehealth: Payer: Self-pay | Admitting: Family Medicine

## 2018-12-18 MED ORDER — ICOSAPENT ETHYL 1 G PO CAPS: 2.0000 | ORAL_CAPSULE | Freq: Two times a day (BID) | ORAL | 3 refills | Status: DC

## 2018-12-18 NOTE — Telephone Encounter (Signed)
Pt aware of results and pt wanted rx printed to take to [place]. Rx printed and copy of results printed and placed up front for pt to pick up on Monday.

## 2019-01-08 ENCOUNTER — Other Ambulatory Visit: Payer: Self-pay | Admitting: Family

## 2019-01-08 ENCOUNTER — Other Ambulatory Visit: Payer: Self-pay

## 2019-01-08 ENCOUNTER — Ambulatory Visit (INDEPENDENT_AMBULATORY_CARE_PROVIDER_SITE_OTHER): Payer: Medicare HMO | Admitting: Family

## 2019-01-08 ENCOUNTER — Encounter: Payer: Self-pay | Admitting: Family

## 2019-01-08 ENCOUNTER — Ambulatory Visit (HOSPITAL_COMMUNITY)
Admission: RE | Admit: 2019-01-08 | Discharge: 2019-01-08 | Disposition: A | Discharge status: Home / Self Care | Payer: Medicare HMO | Source: Ambulatory Visit | Attending: Family | Admitting: Family

## 2019-01-08 VITALS — BP 133/71 | HR 59 | Temp 98.5°F | Ht 73.0 in | Wt 272.2 lb

## 2019-01-08 DIAGNOSIS — M79641 Pain in right hand: Secondary | ICD-10-CM

## 2019-01-08 DIAGNOSIS — M25532 Pain in left wrist: Secondary | ICD-10-CM

## 2019-01-08 DIAGNOSIS — M19032 Primary osteoarthritis, left wrist: Secondary | ICD-10-CM | POA: Diagnosis not present

## 2019-01-08 DIAGNOSIS — M79642 Pain in left hand: Secondary | ICD-10-CM

## 2019-01-08 DIAGNOSIS — M1812 Unilateral primary osteoarthritis of first carpometacarpal joint, left hand: Secondary | ICD-10-CM | POA: Diagnosis not present

## 2019-01-08 DIAGNOSIS — E119 Type 2 diabetes mellitus without complications: Secondary | ICD-10-CM | POA: Diagnosis not present

## 2019-01-08 MED ORDER — DICLOFENAC SODIUM 1 % TD GEL: 2.0000 g | Freq: Four times a day (QID) | TRANSDERMAL | 1 refills | Status: DC

## 2019-01-08 MED ORDER — DICLOFENAC SODIUM 75 MG PO TBEC: 75.0000 mg | DELAYED_RELEASE_TABLET | Freq: Two times a day (BID) | ORAL | 0 refills | Status: DC

## 2019-01-08 NOTE — Progress Notes (Signed)
Subjective:    Patient ID: Cristian Moore, male    DOB: 22-Mar-1941, 78 y.o.   MRN: [id number]  Chief Complaint  Patient presents with  . left wrist pain    Wrist Pain  The pain is present in the left wrist and left shoulder. This is a new problem. The current episode started in the past 7 days. There has been no history of extremity trauma. The problem occurs intermittently. The problem has been waxing and waning. The quality of the pain is described as aching. The pain is at a severity of 8/10. The pain is moderate. Associated symptoms include a limited range of motion and stiffness. The symptoms are aggravated by activity. He has tried OTC ointments and OTC pain meds for the symptoms. The treatment provided mild relief.      Review of Systems  Musculoskeletal: Positive for arthralgias and stiffness.  All other systems reviewed and are negative.      Objective:   Physical Exam Vitals signs reviewed.  Constitutional:      General: He is not in acute distress.    Appearance: He is well-developed.  HENT:     Head: Normocephalic.  Eyes:     General:        Right eye: No discharge.        Left eye: No discharge.     Pupils: Pupils are equal, round, and reactive to light.  Neck:     Musculoskeletal: Normal range of motion and neck supple.     Thyroid: No thyromegaly.  Cardiovascular:     Rate and Rhythm: Normal rate and regular rhythm.     Heart sounds: Normal heart sounds. No murmur.  Pulmonary:     Effort: Pulmonary effort is normal. No respiratory distress.     Breath sounds: Normal breath sounds. No wheezing.  Abdominal:     General: Bowel sounds are normal. There is no distension.     Palpations: Abdomen is soft.     Tenderness: There is no abdominal tenderness.  Musculoskeletal:        General: No tenderness.     Comments: Pain in left wrist with flexion or extension or rotation, trace swelling present  Skin:    General: Skin is warm and dry.     Findings: No  erythema or rash.  Neurological:     Mental Status: He is alert and oriented to person, place, and time.     Cranial Nerves: No cranial nerve deficit.     Deep Tendon Reflexes: Reflexes are normal and symmetric.  Psychiatric:        Behavior: Behavior normal.        Thought Content: Thought content normal.        Judgment: Judgment normal.       BP 133/71   Pulse (!) 59   Temp 98.5 F (36.9 C) (Oral)   Ht 6\' 1"  (1.854 m)   Wt 272 lb 3.2 oz (123.5 kg)   BMI 35.91 kg/m      Assessment & Plan:  Cristian Moore comes in today with chief complaint of left wrist pain   Diagnosis and orders addressed:  1. Left wrist pain Rest Since he is having such a large amount of pain will do x-ray Wrist splint placed Start diclofenac BID, no other NSAID's  Labs reviewed - DG Wrist Complete Left; Future - diclofenac (VOLTAREN) 75 MG EC tablet; Take 1 tablet (75 mg total) by mouth 2 (two) times daily.  Dispense: 30 tablet; Refill: 0 - diclofenac sodium (VOLTAREN) 1 % GEL; Apply 2 g topically 4 (four) times daily.  Dispense: 150 g; Refill: [place], FNP

## 2019-01-08 NOTE — Patient Instructions (Signed)
Wrist Pain, Adult There are many things that can cause wrist pain. Some common causes include:  An injury to the wrist area, such as a sprain, strain, or fracture.  Overuse of the joint.  A condition that causes increased pressure on a nerve in the wrist (carpal tunnel syndrome).  Wear and tear of the joints that occurs with aging (osteoarthritis).  A variety of other types of arthritis. Sometimes, the cause of wrist pain is not known. Often, the pain goes away when you follow instructions from your health care provider for relieving pain at home, such as resting or icing the wrist. If your wrist pain continues, it is important to tell your health care provider. Follow these instructions at home:  Rest the wrist area for at least 48 hours or as long as told by your health care provider.  If a splint or elastic bandage has been applied, use it as told by your health care provider. ? Remove the splint or bandage only as told by your health care provider. ? Loosen the splint or bandage if your fingers tingle, become numb, or turn cold or blue.  If directed, apply ice to the injured area. ? If you have a removable splint or elastic bandage, remove it as told by your health care provider. ? Put ice in a plastic bag. ? Place a towel between your skin and the bag or between your splint or bandage and the bag. ? Leave the ice on for 20 minutes, 2-3 times a day.   Keep your arm raised (elevated) above the level of your heart while you are sitting or lying down.  Take over-the-counter and prescription medicines only as told by your health care provider.  Keep all follow-up visits as told by your health care provider. This is important. Contact a health care provider if:  You have a sudden sharp pain in the wrist, hand, or arm that is different or new.  The swelling or bruising on your wrist or hand gets worse.  Your skin becomes red, gets a rash, or has open sores.  Your pain does not  get better or it gets worse. Get help right away if:  You lose feeling in your fingers or hand.  Your fingers turn white, very red, or cold and blue.  You cannot move your fingers.  You have a fever or chills. This information is not intended to replace advice given to you by your health care provider. Make sure you discuss any questions you have with your health care provider. Document Released: 03/27/2005 Document Revised: 05/30/2017 Document Reviewed: 01/04/2016 Elsevier Patient Education  2020 [employer].

## 2019-01-11 ENCOUNTER — Other Ambulatory Visit: Payer: Self-pay | Admitting: Family Medicine

## 2019-01-11 DIAGNOSIS — I10 Essential (primary) hypertension: Secondary | ICD-10-CM

## 2019-01-11 DIAGNOSIS — I2510 Atherosclerotic heart disease of native coronary artery without angina pectoris: Secondary | ICD-10-CM

## 2019-01-12 DIAGNOSIS — L578 Other skin changes due to chronic exposure to nonionizing radiation: Secondary | ICD-10-CM | POA: Diagnosis not present

## 2019-01-12 DIAGNOSIS — L814 Other melanin hyperpigmentation: Secondary | ICD-10-CM | POA: Diagnosis not present

## 2019-01-12 DIAGNOSIS — D229 Melanocytic nevi, unspecified: Secondary | ICD-10-CM | POA: Diagnosis not present

## 2019-01-12 DIAGNOSIS — L821 Other seborrheic keratosis: Secondary | ICD-10-CM | POA: Diagnosis not present

## 2019-01-12 DIAGNOSIS — L819 Disorder of pigmentation, unspecified: Secondary | ICD-10-CM | POA: Diagnosis not present

## 2019-01-12 DIAGNOSIS — L570 Actinic keratosis: Secondary | ICD-10-CM | POA: Diagnosis not present

## 2019-01-19 DIAGNOSIS — M25531 Pain in right wrist: Secondary | ICD-10-CM | POA: Insufficient documentation

## 2019-01-19 DIAGNOSIS — M25532 Pain in left wrist: Secondary | ICD-10-CM | POA: Diagnosis not present

## 2019-02-03 ENCOUNTER — Other Ambulatory Visit: Payer: Self-pay

## 2019-02-04 ENCOUNTER — Encounter: Payer: Self-pay | Admitting: Family Medicine

## 2019-02-04 ENCOUNTER — Other Ambulatory Visit: Payer: Self-pay

## 2019-02-04 ENCOUNTER — Ambulatory Visit (INDEPENDENT_AMBULATORY_CARE_PROVIDER_SITE_OTHER): Payer: Medicare HMO | Admitting: Family Medicine

## 2019-02-04 VITALS — BP 118/73 | HR 63 | Temp 97.7°F | Ht 73.0 in | Wt 271.0 lb

## 2019-02-04 DIAGNOSIS — E118 Type 2 diabetes mellitus with unspecified complications: Secondary | ICD-10-CM | POA: Diagnosis not present

## 2019-02-04 DIAGNOSIS — E559 Vitamin D deficiency, unspecified: Secondary | ICD-10-CM

## 2019-02-04 DIAGNOSIS — J439 Emphysema, unspecified: Secondary | ICD-10-CM | POA: Diagnosis not present

## 2019-02-04 DIAGNOSIS — E039 Hypothyroidism, unspecified: Secondary | ICD-10-CM | POA: Diagnosis not present

## 2019-02-04 DIAGNOSIS — I10 Essential (primary) hypertension: Secondary | ICD-10-CM | POA: Diagnosis not present

## 2019-02-04 DIAGNOSIS — I700 Atherosclerosis of aorta: Secondary | ICD-10-CM | POA: Diagnosis not present

## 2019-02-04 DIAGNOSIS — K219 Gastro-esophageal reflux disease without esophagitis: Secondary | ICD-10-CM | POA: Diagnosis not present

## 2019-02-04 DIAGNOSIS — E782 Mixed hyperlipidemia: Secondary | ICD-10-CM

## 2019-02-04 DIAGNOSIS — M1610 Unilateral primary osteoarthritis, unspecified hip: Secondary | ICD-10-CM | POA: Diagnosis not present

## 2019-02-04 MED ORDER — PREGABALIN 50 MG PO CAPS: ORAL_CAPSULE | ORAL | 1 refills | Status: DC

## 2019-02-04 NOTE — Progress Notes (Signed)
Subjective:  Patient ID: Cristian Moore,  male    DOB: Apr 30, 1941  Age: 78 y.o.    CC: Medical Management of Chronic Issues   HPI Cristian Moore presents for  follow-up of hypertension. Patient has no history of headache chest pain or shortness of breath or recent cough. Patient also denies symptoms of TIA such as numbness weakness lateralizing. Patient denies side effects from medication. States taking it regularly.  Patient also  in for follow-up of elevated cholesterol. Doing well without complaints on current medication. He is taking krill oil 350 BID because Vascepa was too expensive.  Denies side effects  including myalgia and arthralgia and nausea. Also in today for liver function testing. Currently no chest pain, shortness of breath or other cardiovascular related symptoms noted.  Follow-up of diabetes. Patient does check blood sugar at home. Readings run between 100 and 150 Patient denies symptoms such as excessive hunger or urinary frequency, excessive hunger, nausea No significant hypoglycemic spells noted. Medications reviewed. Pt reports taking them regularly. Pt. denies complication/adverse reaction today.  Back pain 8-9/10  History Cristian Moore has a past medical history of Arthritis, Asthma, Benign prostatic hypertrophy, Carotid stenosis, Chest pain, Chronic Dyspnea on exertion, Diabetes mellitus without complication ([place]) (9735), Difficulty urinating, Diverticulitis, Diverticulosis, Exogenous obesity, GERD (gastroesophageal reflux disease), H/O hiatal hernia, Hernia, Hyperlipidemia, Laceration ( 01/11/2009 ), Musculoskeletal pain, Obstructive sleep apnea, Rectal bleeding, SUPRAVENTRICULAR TACHYCARDIA (08/01/2009), Thyroiditis, Ulcer (2008), and Walking pneumonia.   He has a past surgical history that includes Inguinal hernia repair; Kidney stone surgery; Tonsillectomy; Colonoscopy; Rotator cuff repair; Knee surgery; Elbow bursa surgery; Laparoscopic sigmoid colectomy  (07/23/2012); Umbilical hernia repair (07/23/2012); Proctoscopy (07/23/2012); Eyelid Surgery (Bilateral, 05/2013); left heart catheterization with coronary angiogram (N/A, 02/22/2013); and Total hip arthroplasty (Right, 05/27/2018).   His family history includes Heart disease in his mother; Lung cancer in his maternal uncle; Stroke in his mother.He reports that he quit smoking about 19 years ago. His smoking use included cigars. He has never used smokeless tobacco. He reports that he does not drink alcohol or use drugs.  Current Outpatient Medications on File Prior to Visit  Medication Sig Dispense Refill   acetaminophen (TYLENOL) 500 MG tablet Take 1,000 mg by mouth every 6 (six) hours as needed for moderate pain.     albuterol (PROVENTIL HFA;VENTOLIN HFA) 108 (90 Base) MCG/ACT inhaler Inhale 2 puffs into the lungs every 6 (six) hours as needed for wheezing or shortness of breath.     Cholecalciferol (VITAMIN D) 50 MCG (2000 UT) tablet Take 2,000 Units by mouth daily.      Coenzyme Q10 (CO Q-10) 100 MG CAPS Take 100 mg by mouth daily.     diclofenac (VOLTAREN) 75 MG EC tablet Take 1 tablet (75 mg total) by mouth 2 (two) times daily. 30 tablet 0   escitalopram (LEXAPRO) 20 MG tablet TAKE 1 2 (ONE HALF) TABLET BY MOUTH ONCE DAILY     fluticasone (FLONASE) 50 MCG/ACT nasal spray USE 2 SPRAYS IN EACH       NOSTRIL DAILY 48 g 1   hydroxypropyl methylcellulose / hypromellose (ISOPTO TEARS / GONIOVISC) 2.5 % ophthalmic solution Place 1 drop into both eyes daily as needed for dry eyes.     Krill Oil 350 MG CAPS Take 350 mg by mouth.     metFORMIN (GLUCOPHAGE) 500 MG tablet Take 500 mg by mouth daily with breakfast.     metoprolol tartrate (LOPRESSOR) 50 MG tablet TAKE 1/2 TABLET (25MG   TOTAL) TWO TIMES A DAY 90 tablet 1   montelukast (SINGULAIR) 10 MG tablet TAKE 1 TABLET AT BEDTIME 90 tablet 3   Multiple Vitamins-Minerals (CENTRUM SILVER PO) Take 1 tablet by mouth daily.     nitroGLYCERIN  (NITROSTAT) 0.4 MG SL tablet DISSOLVE ONE TABLET UNDER THE TONGUE EVERY 5 MINUTES AS NEEDED FOR CHEST PAIN.  DO NOT EXCEED A TOTAL OF 3 DOSES IN 15 MINUTES (Patient taking differently: Place 0.4 mg under the tongue every 5 (five) minutes as needed for chest pain. DISSOLVE ONE TABLET UNDER THE TONGUE EVERY 5 MINUTES AS NEEDED FOR CHEST PAIN.  DO NOT EXCEED A TOTAL OF 3 DOSES IN 15 MINUTES) 25 tablet 11   ONETOUCH DELICA LANCETS 10U MISC Check BS BID and PRN. DX. E11.8 100 each 11   ONETOUCH VERIO test strip USE TO CHECK BLOOD SUGAR TWICE DAILY AND AS NEEDED 100 each 11   simvastatin (ZOCOR) 20 MG tablet Take 1 tablet (20 mg total) by mouth at bedtime. 90 tablet 3   sodium chloride (OCEAN) 0.65 % nasal spray Place 1 spray into the nose daily as needed for congestion.      Spacer/Aero-Holding Chambers (AEROCHAMBER MV) inhaler Use as instructed 1 each 0   sucralfate (CARAFATE) 1 g tablet Take 1 tablet (1 g total) by mouth daily as needed (stomach coating). 90 tablet 0   tamsulosin (FLOMAX) 0.4 MG CAPS capsule TAKE 2 CAPSULES (0.8MG      TOTAL) DAILY AFTER SUPPER (Patient taking differently: 0.4 mg. ) 180 capsule 1   diclofenac sodium (VOLTAREN) 1 % GEL Apply 2 g topically 4 (four) times daily. (Patient not taking: Reported on 02/04/2019) 150 g 1   PRESCRIPTION MEDICATION Inhale 1 application into the lungs at bedtime. "CPAP" MACHINE     No current facility-administered medications on file prior to visit.     ROS Review of Systems  Constitutional: Negative.   HENT: Negative.   Eyes: Negative for visual disturbance.  Respiratory: Negative for cough and shortness of breath.   Cardiovascular: Negative for chest pain and leg swelling.  Gastrointestinal: Negative for abdominal pain, diarrhea, nausea and vomiting.  Genitourinary: Negative for difficulty urinating.  Musculoskeletal: Negative for arthralgias and myalgias.  Skin: Negative for rash.  Neurological: Negative for headaches.   Psychiatric/Behavioral: Negative for sleep disturbance.    Objective:  BP 118/73    Pulse 63    Temp 97.7 F (36.5 C) (Temporal)    Ht 6\' 1"  (1.854 m)    Wt 271 lb (122.9 kg)    BMI 35.75 kg/m   BP Readings from Last 3 Encounters:  02/04/19 118/73  01/08/19 133/71  08/27/18 115/66    Wt Readings from Last 3 Encounters:  02/04/19 271 lb (122.9 kg)  01/08/19 272 lb 3.2 oz (123.5 kg)  08/27/18 271 lb 4 oz (123 kg)     Physical Exam Constitutional:      General: He is not in acute distress.    Appearance: He is well-developed.  HENT:     Head: Normocephalic and atraumatic.     Right Ear: External ear normal.     Left Ear: External ear normal.     Nose: Nose normal.  Eyes:     Conjunctiva/sclera: Conjunctivae normal.     Pupils: Pupils are equal, round, and reactive to light.  Neck:     Musculoskeletal: Normal range of motion and neck supple.  Cardiovascular:     Rate and Rhythm: Normal rate and regular rhythm.  Heart sounds: Normal heart sounds. No murmur.  Pulmonary:     Effort: Pulmonary effort is normal. No respiratory distress.     Breath sounds: No wheezing or rales.     Comments: Diminished (COPD) Abdominal:     Palpations: Abdomen is soft.     Tenderness: There is no abdominal tenderness.  Musculoskeletal: Normal range of motion.  Skin:    General: Skin is warm and dry.  Neurological:     Mental Status: He is alert and oriented to person, place, and time.     Deep Tendon Reflexes: Reflexes are normal and symmetric.  Psychiatric:        Behavior: Behavior normal.        Thought Content: Thought content normal.        Judgment: Judgment normal.     Diabetic Foot Exam - Simple   Simple Foot Form Diabetic Foot exam was performed with the following findings: Yes 02/04/2019  9:00 AM  Visual Inspection No deformities, no ulcerations, no other skin breakdown bilaterally: Yes Sensation Testing Intact to touch and monofilament testing bilaterally:  Yes Pulse Check Posterior Tibialis and Dorsalis pulse intact bilaterally: Yes Comments       Assessment & Plan:   Zafar was seen today for medical management of chronic issues.  Diagnoses and all orders for this visit:  Primary osteoarthritis of hip, unspecified laterality  Pulmonary emphysema, unspecified emphysema type ([place])  DM (diabetes mellitus), type 2 with complications ([place])  Gastroesophageal reflux disease, esophagitis presence not specified  Mixed hyperlipidemia -     Lipid panel  Essential hypertension  Vitamin D deficiency  Aortic atherosclerosis ([facility])  Hypothyroidism, unspecified type -     TSH -     T4, Free -     T3, Free  Other orders -     pregabalin (LYRICA) 50 MG capsule; 1 qhs X7 days , then 2 qhs X 7d, then 3 qhs X 7d, then 4 qhs -     zolpidem (AMBIEN) 10 MG tablet; Take 1 tablet (10 mg total) by mouth at bedtime.   I have discontinued Rainen B. Rooks's Icosapent Ethyl. I am also having him start on pregabalin. Additionally, I am having him maintain his Vitamin D, Co Q-10, Multiple Vitamins-Minerals (CENTRUM SILVER PO), sodium chloride, OneTouch Delica Lancets 26J, PRESCRIPTION MEDICATION, nitroGLYCERIN, AeroChamber MV, OneTouch Verio, albuterol, hydroxypropyl methylcellulose / hypromellose, acetaminophen, simvastatin, sucralfate, montelukast, fluticasone, escitalopram, diclofenac, diclofenac sodium, metoprolol tartrate, tamsulosin, metFORMIN, Krill Oil, and zolpidem.  Meds ordered this encounter  Medications   pregabalin (LYRICA) 50 MG capsule    Sig: 1 qhs X7 days , then 2 qhs X 7d, then 3 qhs X 7d, then 4 qhs    Dispense:  120 capsule    Refill:  1   zolpidem (AMBIEN) 10 MG tablet    Sig: Take 1 tablet (10 mg total) by mouth at bedtime.    Dispense:  90 tablet    Refill:  1     Follow-up: Return in about 3 months (around 05/07/2019).  [name], M.D.

## 2019-02-04 NOTE — Patient Instructions (Signed)
Return in 2 months

## 2019-02-05 DIAGNOSIS — R69 Illness, unspecified: Secondary | ICD-10-CM | POA: Diagnosis not present

## 2019-02-05 LAB — LIPID PANEL
Chol/HDL Ratio: 3.4 ratio (ref 0.0–5.0)
Cholesterol, Total: 145 mg/dL (ref 100–199)
HDL: 43 mg/dL (ref >39)
LDL Calculated: 62 mg/dL (ref 0–99)
Triglycerides: 198 mg/dL — ABNORMAL HIGH (ref 0–149)
VLDL Cholesterol Cal: 40 mg/dL (ref 5–40)

## 2019-02-05 LAB — T3, FREE: T3, Free: 2.6 pg/mL (ref 2.0–4.4)

## 2019-02-05 LAB — T4, FREE: Free T4: 0.81 ng/dL — ABNORMAL LOW (ref 0.82–1.77)

## 2019-02-05 LAB — TSH: TSH: 0.679 u[IU]/mL (ref 0.450–4.500)

## 2019-02-05 MED ORDER — ZOLPIDEM TARTRATE 10 MG PO TABS: 10.0000 mg | ORAL_TABLET | Freq: Every day | ORAL | 1 refills | Status: DC

## 2019-02-05 NOTE — Progress Notes (Signed)
Hello Oluwaferanmi,  Your lab result is normal and/or stable.Some minor variations that are not significant are commonly marked abnormal, but do not represent any medical problem for you.  Best regards, Jesten Cappuccio, M.D.

## 2019-02-16 ENCOUNTER — Other Ambulatory Visit: Payer: Self-pay | Admitting: Family Medicine

## 2019-02-16 DIAGNOSIS — M25532 Pain in left wrist: Secondary | ICD-10-CM | POA: Diagnosis not present

## 2019-02-16 DIAGNOSIS — M25531 Pain in right wrist: Secondary | ICD-10-CM | POA: Diagnosis not present

## 2019-02-16 MED ORDER — PREGABALIN 50 MG PO CAPS: ORAL_CAPSULE | ORAL | 1 refills | Status: DC

## 2019-02-16 NOTE — Telephone Encounter (Signed)
I wrote a new scrip and printed it. I'm not sure if that works remotely. You may have to print it agsain from there and fax. Thanks, WS

## 2019-02-17 MED ORDER — PREGABALIN 50 MG PO CAPS: ORAL_CAPSULE | ORAL | 1 refills | Status: DC

## 2019-02-17 NOTE — Telephone Encounter (Signed)
Pt aware faxed

## 2019-02-17 NOTE — Telephone Encounter (Signed)
I have printed the RX and laid  on [facility] desk to sign.

## 2019-02-24 DIAGNOSIS — L819 Disorder of pigmentation, unspecified: Secondary | ICD-10-CM | POA: Diagnosis not present

## 2019-02-24 DIAGNOSIS — L570 Actinic keratosis: Secondary | ICD-10-CM | POA: Diagnosis not present

## 2019-03-01 ENCOUNTER — Telehealth: Payer: Self-pay | Admitting: Family Medicine

## 2019-03-01 NOTE — Telephone Encounter (Signed)
Patient aware provider is not in the office today.   Please have Dr. Livia Snellen do another Lyrica script to send to Dr. Clydene Laming at [facility]  in [place], fax # [phone]. The other script never made it to them.

## 2019-03-02 ENCOUNTER — Other Ambulatory Visit: Payer: Self-pay | Admitting: Family Medicine

## 2019-03-02 MED ORDER — PREGABALIN 50 MG PO CAPS: ORAL_CAPSULE | ORAL | 1 refills | Status: DC

## 2019-03-02 NOTE — Telephone Encounter (Signed)
Patient's wife aware that Lyrica prescription will be faxed to [place] today.

## 2019-03-02 NOTE — Telephone Encounter (Signed)
Scrip given to [facility]

## 2019-04-02 ENCOUNTER — Other Ambulatory Visit: Payer: Self-pay | Admitting: Family Medicine

## 2019-04-06 ENCOUNTER — Ambulatory Visit (INDEPENDENT_AMBULATORY_CARE_PROVIDER_SITE_OTHER): Payer: Medicare HMO | Admitting: Family Medicine

## 2019-04-06 ENCOUNTER — Encounter: Payer: Self-pay | Admitting: Family Medicine

## 2019-04-06 DIAGNOSIS — J209 Acute bronchitis, unspecified: Secondary | ICD-10-CM | POA: Diagnosis not present

## 2019-04-06 DIAGNOSIS — M25532 Pain in left wrist: Secondary | ICD-10-CM | POA: Diagnosis not present

## 2019-04-06 DIAGNOSIS — I10 Essential (primary) hypertension: Secondary | ICD-10-CM

## 2019-04-06 DIAGNOSIS — I2510 Atherosclerotic heart disease of native coronary artery without angina pectoris: Secondary | ICD-10-CM | POA: Diagnosis not present

## 2019-04-06 MED ORDER — GABAPENTIN 300 MG PO CAPS: 600.0000 mg | ORAL_CAPSULE | Freq: Three times a day (TID) | ORAL | 1 refills | Status: DC

## 2019-04-06 MED ORDER — ALBUTEROL SULFATE HFA 108 (90 BASE) MCG/ACT IN AERS: 2.0000 | INHALATION_SPRAY | Freq: Four times a day (QID) | RESPIRATORY_TRACT | 3 refills | Status: AC | PRN

## 2019-04-06 MED ORDER — DICLOFENAC SODIUM 75 MG PO TBEC: 75.0000 mg | DELAYED_RELEASE_TABLET | Freq: Two times a day (BID) | ORAL | 0 refills | Status: DC

## 2019-04-06 MED ORDER — MONTELUKAST SODIUM 10 MG PO TABS: 10.0000 mg | ORAL_TABLET | Freq: Every day | ORAL | 3 refills | Status: DC

## 2019-04-06 MED ORDER — METFORMIN HCL 500 MG PO TABS: 500.0000 mg | ORAL_TABLET | Freq: Every day | ORAL | 1 refills | Status: DC

## 2019-04-06 MED ORDER — METOPROLOL TARTRATE 50 MG PO TABS: 50.0000 mg | ORAL_TABLET | Freq: Two times a day (BID) | ORAL | 1 refills | Status: DC

## 2019-04-06 NOTE — Progress Notes (Signed)
Subjective:  Patient ID: Cristian Moore, male    DOB: 07-Oct-1940  Age: 78 y.o. MRN: [id number]  CC: No chief complaint on file.   HPI KEYWAN REAU presents for switched to gabapentin 300 mg titrated to TID. Just started the TID yesterday. Pain maybe a little better. Couldn't afford [facility] so [facility]. Pain location hips.8/10 with ambulation.  Weight 270, Glucose 121, BP, 139/73, HR 65.  Depression screen [facility] 2/9 02/04/2019 01/08/2019 08/27/2018  Decreased Interest 0 0 0  Down, Depressed, Hopeless 0 0 0  PHQ - 2 Score 0 0 0  Altered sleeping 0 - -  Tired, decreased energy 0 - -  Change in appetite 0 - -  Feeling bad or failure about yourself  0 - -  Trouble concentrating 0 - -  Moving slowly or fidgety/restless 0 - -  Suicidal thoughts 0 - -  PHQ-9 Score 0 - -  Difficult doing work/chores - - -  Some recent data might be hidden    History Abraham has a past medical history of Arthritis, Asthma, Benign prostatic hypertrophy, Carotid stenosis, Chest pain, Chronic Dyspnea on exertion, Diabetes mellitus without complication ([place]) (123456), Difficulty urinating, Diverticulitis, Diverticulosis, Exogenous obesity, GERD (gastroesophageal reflux disease), H/O hiatal hernia, Hernia, Hyperlipidemia, Laceration ( 01/11/2009 ), Musculoskeletal pain, Obstructive sleep apnea, Rectal bleeding, SUPRAVENTRICULAR TACHYCARDIA (08/01/2009), Thyroiditis, Ulcer (2008), and Walking pneumonia.   He has a past surgical history that includes Inguinal hernia repair; Kidney stone surgery; Tonsillectomy; Colonoscopy; Rotator cuff repair; Knee surgery; Elbow bursa surgery; Laparoscopic sigmoid colectomy (07/23/2012); Umbilical hernia repair (07/23/2012); Proctoscopy (07/23/2012); Eyelid Surgery (Bilateral, 05/2013); left heart catheterization with coronary angiogram (N/A, 02/22/2013); and Total hip arthroplasty (Right, 05/27/2018).   His family history includes Heart disease in his mother;  Lung cancer in his maternal uncle; Stroke in his mother.He reports that he quit smoking about 19 years ago. His smoking use included cigars. He has never used smokeless tobacco. He reports that he does not drink alcohol or use drugs.    ROS Review of Systems Noncontributory except as per HPI Objective:  There were no vitals taken for this visit.  BP Readings from Last 3 Encounters:  02/04/19 118/73  01/08/19 133/71  08/27/18 115/66    Wt Readings from Last 3 Encounters:  02/04/19 271 lb (122.9 kg)  01/08/19 272 lb 3.2 oz (123.5 kg)  08/27/18 271 lb 4 oz (123 kg)     Physical Exam  Exam deferred. Pt. [place] due to COVID 19. Phone visit performed.   Assessment & Plan:   Diagnoses and all orders for this visit:  Acute bronchitis, unspecified organism -     albuterol (VENTOLIN HFA) 108 (90 Base) MCG/ACT inhaler; Inhale 2 puffs into the lungs every 6 (six) hours as needed for wheezing or shortness of breath.  Left wrist pain -     diclofenac (VOLTAREN) 75 MG EC tablet; Take 1 tablet (75 mg total) by mouth 2 (two) times daily.  Essential hypertension -     metoprolol tartrate (LOPRESSOR) 50 MG tablet; Take 1 tablet (50 mg total) by mouth 2 (two) times daily.  Non-Obstructive CAD -     metoprolol tartrate (LOPRESSOR) 50 MG tablet; Take 1 tablet (50 mg total) by mouth 2 (two) times daily.  Other orders -     metFORMIN (GLUCOPHAGE) 500 MG tablet; Take 1 tablet (500 mg total) by mouth daily with breakfast. -     montelukast (SINGULAIR) 10 MG tablet;  Take 1 tablet (10 mg total) by mouth at bedtime. -     gabapentin (NEURONTIN) 300 MG capsule; Take 2 capsules (600 mg total) by mouth 3 (three) times daily.       I have discontinued Dywane B. Coppedge's diclofenac sodium and metFORMIN. I have also changed his metFORMIN, albuterol, metoprolol tartrate, and montelukast. Additionally, I am having him start on gabapentin. Lastly, I am having him maintain his Vitamin D, Co Q-10,  Multiple Vitamins-Minerals (CENTRUM SILVER PO), sodium chloride, OneTouch Delica Lancets 99991111, PRESCRIPTION MEDICATION, nitroGLYCERIN, AeroChamber MV, OneTouch Verio, hydroxypropyl methylcellulose / hypromellose, acetaminophen, sucralfate, escitalopram, tamsulosin, Krill Oil, zolpidem, pregabalin, simvastatin, fluticasone, and diclofenac.  Allergies as of 04/06/2019      Reactions   Penicillins Rash   Over 60 years ago Has patient had a PCN reaction causing immediate rash, facial/tongue/throat swelling, SOB or lightheadedness with hypotension: Unknown Has patient had a PCN reaction causing severe rash involving mucus membranes or skin necrosis: Unknown Has patient had a PCN reaction that required hospitalization: No Has patient had a PCN reaction occurring within the last 10 years: No If all of the above answers are "NO", then may proceed with Cephalosporin use.      Medication List       Accurate as of April 06, 2019 10:39 PM. If you have any questions, ask your nurse or doctor.        STOP taking these medications   diclofenac sodium 1 % Gel Commonly known as: Voltaren Stopped by: [name], MD     TAKE these medications   acetaminophen 500 MG tablet Commonly known as: TYLENOL Take 1,000 mg by mouth every 6 (six) hours as needed for moderate pain.   AeroChamber MV inhaler Use as instructed   albuterol 108 (90 Base) MCG/ACT inhaler Commonly known as: VENTOLIN HFA Inhale 2 puffs into the lungs every 6 (six) hours as needed for wheezing or shortness of breath.   CENTRUM SILVER PO Take 1 tablet by mouth daily.   Co Q-10 100 MG Caps Take 100 mg by mouth daily.   diclofenac 75 MG EC tablet Commonly known as: VOLTAREN Take 1 tablet (75 mg total) by mouth 2 (two) times daily.   escitalopram 20 MG tablet Commonly known as: LEXAPRO TAKE 1 2 (ONE HALF) TABLET BY MOUTH ONCE DAILY   fluticasone 50 MCG/ACT nasal spray Commonly known as: FLONASE USE 2 SPRAYS IN EACH        NOSTRIL DAILY   gabapentin 300 MG capsule Commonly known as: Neurontin Take 2 capsules (600 mg total) by mouth 3 (three) times daily. Started by: [name], MD   hydroxypropyl methylcellulose / hypromellose 2.5 % ophthalmic solution Commonly known as: ISOPTO TEARS / GONIOVISC Place 1 drop into both eyes daily as needed for dry eyes.   Krill Oil 350 MG Caps Take 350 mg by mouth.   metFORMIN 500 MG tablet Commonly known as: GLUCOPHAGE Take 1 tablet (500 mg total) by mouth daily with breakfast. What changed: Another medication with the same name was removed. Continue taking this medication, and follow the directions you see here. Changed by: [name], MD   metoprolol tartrate 50 MG tablet Commonly known as: LOPRESSOR Take 1 tablet (50 mg total) by mouth 2 (two) times daily. What changed: See the new instructions. Changed by: [name], MD   montelukast 10 MG tablet Commonly known as: SINGULAIR Take 1 tablet (10 mg total) by mouth at bedtime.   nitroGLYCERIN 0.4 MG SL tablet  Commonly known as: Nitrostat DISSOLVE ONE TABLET UNDER THE TONGUE EVERY 5 MINUTES AS NEEDED FOR CHEST PAIN.  DO NOT EXCEED A TOTAL OF 3 DOSES IN 15 MINUTES What changed:   how much to take  how to take this  when to take this  reasons to take this   OneTouch Delica Lancets 99991111 Misc Check BS BID and PRN. DX. E11.8   OneTouch Verio test strip Generic drug: glucose blood USE TO CHECK BLOOD SUGAR TWICE DAILY AND AS NEEDED   pregabalin 50 MG capsule Commonly known as: Lyrica 1 qhs X7 days , then 2 qhs X 7d, then 3 qhs X 7d, then 4 qhs   PRESCRIPTION MEDICATION Inhale 1 application into the lungs at bedtime. "CPAP" MACHINE   simvastatin 20 MG tablet Commonly known as: ZOCOR TAKE 1 TABLET AT BEDTIME   sodium chloride 0.65 % nasal spray Commonly known as: OCEAN Place 1 spray into the nose daily as needed for congestion.   sucralfate 1 g tablet Commonly known as: CARAFATE Take 1  tablet (1 g total) by mouth daily as needed (stomach coating).   tamsulosin 0.4 MG Caps capsule Commonly known as: FLOMAX TAKE 2 CAPSULES (0.8MG      TOTAL) DAILY AFTER SUPPER What changed: See the new instructions.   Vitamin D 50 MCG (2000 UT) tablet Take 2,000 Units by mouth daily.   zolpidem 10 MG tablet Commonly known as: AMBIEN Take 1 tablet (10 mg total) by mouth at bedtime.      Virtual Visit via telephone Note  I discussed the limitations, risks, security and privacy concerns of performing an evaluation and management service by telephone and the availability of in person appointments. I also discussed with the patient that there may be a patient responsible charge related to this service. The patient expressed understanding and agreed to proceed. Pt. Is at home. Dr. Livia Snellen is in his office.  Follow Up Instructions:   I discussed the assessment and treatment plan with the patient. The patient was provided an opportunity to ask questions and all were answered. The patient agreed with the plan and demonstrated an understanding of the instructions.   The patient was advised to call back or seek an in-person evaluation if the symptoms worsen or if the condition fails to improve as anticipated.  Total minutes including chart review and phone contact time: 20   Follow-up: Return in about 6 weeks (around 05/18/2019).  [name], M.D.

## 2019-04-15 ENCOUNTER — Other Ambulatory Visit: Payer: Self-pay | Admitting: Family Medicine

## 2019-04-15 ENCOUNTER — Telehealth: Payer: Self-pay | Admitting: Family Medicine

## 2019-04-15 NOTE — Telephone Encounter (Signed)
Please call patient

## 2019-04-16 ENCOUNTER — Ambulatory Visit (INDEPENDENT_AMBULATORY_CARE_PROVIDER_SITE_OTHER): Payer: Medicare HMO

## 2019-04-16 ENCOUNTER — Other Ambulatory Visit: Payer: Self-pay

## 2019-04-16 DIAGNOSIS — Z23 Encounter for immunization: Secondary | ICD-10-CM | POA: Diagnosis not present

## 2019-04-30 ENCOUNTER — Other Ambulatory Visit: Payer: Self-pay | Admitting: Family Medicine

## 2019-05-10 DIAGNOSIS — E785 Hyperlipidemia, unspecified: Secondary | ICD-10-CM | POA: Insufficient documentation

## 2019-05-10 DIAGNOSIS — I6523 Occlusion and stenosis of bilateral carotid arteries: Secondary | ICD-10-CM | POA: Insufficient documentation

## 2019-05-10 NOTE — Progress Notes (Signed)
Cardiology Office Note   Date:  05/12/2019   ID:  Cristian Moore, DOB 06-13-1941, MRN [id number]  PCP:  [name], MD  Cardiologist:   [name], MD  Referring:   [name], MD   Chief Complaint  Patient presents with  . Coronary Artery Disease      History of Present Illness: Cristian Moore is a 78 y.o. male who presents for preop clearance prior to hip surgery.   He has a history of DM, nonobstructive CAD by cath 2014. In 2017 he  had a [employer] which demonstrated an EF of 48% with a defect present in the basal inferior and mid inferior location. This is most likely due to artifact but  inferior ischemia could not be ruled out.   Since I last saw him he has done OK.  He had his hip replaced.  However, he is has back pain and knee pain which limits him.  He still taking care of about 9 pounds although he is starting to sell them off. The patient denies any new symptoms such as chest discomfort, neck or arm discomfort. There has been no new shortness of breath, PND or orthopnea. There have been no reported palpitations, presyncope or syncope.    Past Medical History:  Diagnosis Date  . Arthritis   . Asthma   . Benign prostatic hypertrophy   . Carotid stenosis    Carotid US (07/2013):  Bilateral 1-39% ICA (f/u 2 yrs)  . Chest pain    a. 01/2013 Cath: LM nl, LAD 25p, D1 25p, LCX nl, RCA 25p, PDA nl, RPL nl, EF 55%;  b. 03/2015 MV: EF 59%, no ischemia; c. 03/2016 MV: basal inf/mid inf defect, likely artifact, EF 48%, low risk.  . Chronic Dyspnea on exertion    a. See cardiac eval under CAD heading;  b. 03/2015 Echo: EF 50-55%, gr1 DD, triv AI;  b. 03/2015 CTA Chest: No PE;  c. 05/2016 CPX: no significant cardi-pulmonary limitation noted. Suspect obesity contributing to exertional symptoms.  . Diabetes mellitus without complication ([place]) 123456  . Difficulty urinating   . Diverticulitis   . Diverticulosis   . Exogenous obesity   . GERD (gastroesophageal  reflux disease)   . H/O hiatal hernia   . Hernia    Umbulical   . Hyperlipidemia   . Laceration  01/11/2009    Deep laceration left index finger dorsally  . Musculoskeletal pain    in the right shoulder - S/P ROTATOR CUFF REPAIR-BUT STILL HAS PAIN  . Obstructive sleep apnea     CPAP    AUTO SET 6 TO 20 - USUALLY SETTLES OUT AT 10  . Rectal bleeding    PT ATTRIBUTES TO HEMORRHOIDS  . SUPRAVENTRICULAR TACHYCARDIA 08/01/2009   Qualifier: Diagnosis of  By: Lovette Cliche, CNA, Christy    . Thyroiditis   . Ulcer 2008  . Walking pneumonia     Past Surgical History:  Procedure Laterality Date  . COLONOSCOPY     for polyps  . ELBOW BURSA SURGERY    . Eyelid Surgery Bilateral 05/2013  . INGUINAL HERNIA REPAIR    . KIDNEY STONE SURGERY    . KNEE SURGERY     meniscus right knee  . LAPAROSCOPIC SIGMOID COLECTOMY  07/23/2012   Procedure: LAPAROSCOPIC SIGMOID COLECTOMY;  Surgeon: [name], MD;  Location: [facility];  Service: General;  Laterality: N/A;  Laparoscopic Sigmoid Colectomy  . LEFT HEART CATHETERIZATION WITH CORONARY ANGIOGRAM N/A  02/22/2013   Procedure: LEFT HEART CATHETERIZATION WITH CORONARY ANGIOGRAM;  Surgeon: [name], MD;  Location: [facility] CATH LAB;  Service: Cardiovascular;  Laterality: N/A;  . PROCTOSCOPY  07/23/2012   Procedure: PROCTOSCOPY;  Surgeon: [name], MD;  Location: [facility];  Service: General;  Laterality: N/A;  Rigid Proctoscopy  . ROTATOR CUFF REPAIR    . TONSILLECTOMY    . TOTAL HIP ARTHROPLASTY Right 05/27/2018   Procedure: RIGHT TOTAL HIP ARTHROPLASTY ANTERIOR APPROACH;  Surgeon: [name], MD;  Location: [facility];  Service: Orthopedics;  Laterality: Right;  19min  . UMBILICAL HERNIA REPAIR  07/23/2012   Procedure: HERNIA REPAIR UMBILICAL ADULT;  Surgeon: [name], MD;  Location: [facility];  Service: General;  Laterality: N/A;  Primary Umbilical Hernia Repair     Current Outpatient Medications  Medication Sig Dispense Refill  . acetaminophen  (TYLENOL) 500 MG tablet Take 1,000 mg by mouth every 6 (six) hours as needed for moderate pain.    Marland Kitchen albuterol (VENTOLIN HFA) 108 (90 Base) MCG/ACT inhaler Inhale 2 puffs into the lungs every 6 (six) hours as needed for wheezing or shortness of breath. 18 g 3  . Cholecalciferol (VITAMIN D) 50 MCG (2000 UT) tablet Take 2,000 Units by mouth daily.     . diclofenac (VOLTAREN) 75 MG EC tablet Take 1 tablet (75 mg total) by mouth 2 (two) times daily. 90 tablet 0  . escitalopram (LEXAPRO) 20 MG tablet Take 1/2 (one-half) tablet by mouth once daily 45 tablet 3  . fluticasone (FLONASE) 50 MCG/ACT nasal spray USE 2 SPRAYS IN EACH       NOSTRIL DAILY 48 g 1  . gabapentin (NEURONTIN) 300 MG capsule Take 2 capsules (600 mg total) by mouth 3 (three) times daily. 540 capsule 1  . hydroxypropyl methylcellulose / hypromellose (ISOPTO TEARS / GONIOVISC) 2.5 % ophthalmic solution Place 1 drop into both eyes daily as needed for dry eyes.    [name] Oil 350 MG CAPS Take 350 mg by mouth 2 (two) times daily.     . metoprolol tartrate (LOPRESSOR) 50 MG tablet Take 1 tablet (50 mg total) by mouth 2 (two) times daily. 180 tablet 1  . montelukast (SINGULAIR) 10 MG tablet Take 1 tablet (10 mg total) by mouth at bedtime. 90 tablet 3  . Multiple Vitamins-Minerals (CENTRUM SILVER PO) Take 1 tablet by mouth daily.    . nitroGLYCERIN (NITROSTAT) 0.4 MG SL tablet DISSOLVE ONE TABLET UNDER THE TONGUE EVERY 5 MINUTES AS NEEDED FOR CHEST PAIN.  DO NOT EXCEED A TOTAL OF 3 DOSES IN 15 MINUTES (Patient taking differently: Place 0.4 mg under the tongue every 5 (five) minutes as needed for chest pain. DISSOLVE ONE TABLET UNDER THE TONGUE EVERY 5 MINUTES AS NEEDED FOR CHEST PAIN.  DO NOT EXCEED A TOTAL OF 3 DOSES IN 15 MINUTES) 25 tablet 11  . PRESCRIPTION MEDICATION Inhale 1 application into the lungs at bedtime. "CPAP" MACHINE    . simvastatin (ZOCOR) 20 MG tablet Take 20 mg by mouth daily.    . sodium chloride (OCEAN) 0.65 % nasal spray  Place 1 spray into the nose daily as needed for congestion.     . sucralfate (CARAFATE) 1 g tablet Take 1 tablet (1 g total) by mouth daily as needed (stomach coating). 90 tablet 0  . tamsulosin (FLOMAX) 0.4 MG CAPS capsule TAKE 2 CAPSULES (0.8MG      TOTAL) DAILY AFTER SUPPER (Patient taking differently: 0.4 mg. ) 180 capsule 1  .  zolpidem (AMBIEN) 10 MG tablet Take 1 tablet (10 mg total) by mouth at bedtime. 90 tablet 1  . metFORMIN (GLUCOPHAGE) 500 MG tablet Take 1 tablet (500 mg total) by mouth daily with breakfast. 90 tablet 1  . ONETOUCH DELICA LANCETS 99991111 MISC Check BS BID and PRN. DX. E11.8 100 each 11  . ONETOUCH VERIO test strip USE TO CHECK BLOOD SUGAR TWICE DAILY AND AS NEEDED 100 each 11  . Spacer/Aero-Holding Chambers (AEROCHAMBER MV) inhaler Use as instructed 1 each 0   No current facility-administered medications for this visit.     Allergies:   Penicillins    ROS:  Please see the history of present illness.   Otherwise, review of systems are positive for none .   All other systems are reviewed and negative.    PHYSICAL EXAM: VS:  BP 120/80   Pulse 61   Ht 6\' 1"  (1.854 m)   Wt 274 lb (124.3 kg)   BMI 36.15 kg/m  , BMI Body mass index is 36.15 kg/m. GENERAL:  Well appearing NECK:  No jugular venous distention, waveform within normal limits, carotid upstroke brisk and symmetric, no bruits, no thyromegaly LUNGS:  Clear to auscultation bilaterally CHEST:  Unremarkable HEART:  PMI not displaced or sustained,S1 and S2 within normal limits, no S3, no S4, no clicks, no rubs, no murmurs ABD:  Flat, positive bowel sounds normal in frequency in pitch, no bruits, no rebound, no guarding, no midline pulsatile mass, no hepatomegaly, no splenomegaly EXT:  2 plus pulses throughout, no edema, no cyanosis no clubbing   EKG:  EKG not ordered today. Sinus rhythm, rate 61, axis within normal limits, intervals, no acute ST-T wave changes.  05/12/2019  Recent Labs: 12/11/2018: ALT 16;  BUN 18; Creatinine, Ser 1.21; Hemoglobin 14.5; Platelets 299; Potassium 4.5; Sodium 138 02/04/2019: TSH 0.679    Lipid Panel    Component Value Date/Time   CHOL 145 02/04/2019 1018   TRIG 198 (H) 02/04/2019 1018   TRIG 111 01/22/2017 1027   HDL 43 02/04/2019 1018   HDL 39 (L) 01/22/2017 1027   CHOLHDL 3.4 02/04/2019 1018   CHOLHDL 3.1 02/21/2013 0212   VLDL 21 02/21/2013 0212   LDLCALC 62 02/04/2019 1018   LDLCALC 89 01/31/2014 1010      Wt Readings from Last 3 Encounters:  05/12/19 274 lb (124.3 kg)  02/04/19 271 lb (122.9 kg)  01/08/19 272 lb 3.2 oz (123.5 kg)      Other studies Reviewed: Additional studies/ records that were reviewed today include: Labs Review of the above records demonstrates:   See below   ASSESSMENT AND PLAN:   CAD:  The patient has no new sypmtoms.  No further cardiovascular testing is indicated.  We will continue with aggressive risk reduction and meds as listed.  HTN:   Blood pressure is at target.  No change.   DYSLIPIDEMIA:    LDL was excellent.   No change in meds.   CAROTID STENOSIS:     I will check this after the follow appt next year.  This was less than 50% bilateral in 2018.     OVERWEIGHT:    I gave him specific instructions for weight loss.   Current medicines are reviewed at length with the patient today.  The patient does not have concerns regarding medicines.  The following changes have been made:  None  Labs/ tests ordered today include: None  Orders Placed This Encounter  Procedures  . EKG 12-Lead  Follow up with me in one year.    Signed, [name], MD  05/12/2019 11:49 AM    [facility]

## 2019-05-12 ENCOUNTER — Other Ambulatory Visit: Payer: Self-pay

## 2019-05-12 ENCOUNTER — Encounter: Payer: Self-pay | Admitting: Cardiology

## 2019-05-12 ENCOUNTER — Ambulatory Visit: Payer: Medicare HMO | Admitting: Cardiology

## 2019-05-12 VITALS — BP 120/80 | HR 61 | Ht 73.0 in | Wt 274.0 lb

## 2019-05-12 DIAGNOSIS — I2510 Atherosclerotic heart disease of native coronary artery without angina pectoris: Secondary | ICD-10-CM | POA: Diagnosis not present

## 2019-05-12 DIAGNOSIS — I10 Essential (primary) hypertension: Secondary | ICD-10-CM

## 2019-05-12 DIAGNOSIS — E785 Hyperlipidemia, unspecified: Secondary | ICD-10-CM | POA: Diagnosis not present

## 2019-05-12 DIAGNOSIS — I6523 Occlusion and stenosis of bilateral carotid arteries: Secondary | ICD-10-CM | POA: Diagnosis not present

## 2019-05-12 NOTE — Patient Instructions (Signed)
Medication Instructions:  The current medical regimen is effective;  continue present plan and medications.  *If you need a refill on your cardiac medications before your next appointment, please call your pharmacy*  Follow-Up: At [facility], you and your health needs are our priority.  As part of our continuing mission to provide you with exceptional heart care, we have created designated Provider Care Teams.  These Care Teams include your primary Cardiologist (physician) and Advanced Practice Providers (APPs -  Physician Assistants and Nurse Practitioners) who all work together to provide you with the care you need, when you need it.  Your next appointment:   12 months  The format for your next appointment:   In Person  Provider:   In [place] with Dr Hochrein  Thank you for choosing [facility]!!     

## 2019-05-19 ENCOUNTER — Other Ambulatory Visit: Payer: Self-pay

## 2019-05-20 ENCOUNTER — Encounter: Payer: Self-pay | Admitting: Family Medicine

## 2019-05-20 ENCOUNTER — Ambulatory Visit (INDEPENDENT_AMBULATORY_CARE_PROVIDER_SITE_OTHER): Payer: Medicare HMO | Admitting: Family Medicine

## 2019-05-20 VITALS — BP 127/82 | HR 62 | Temp 97.3°F | Ht 73.0 in | Wt 274.0 lb

## 2019-05-20 DIAGNOSIS — M1610 Unilateral primary osteoarthritis, unspecified hip: Secondary | ICD-10-CM

## 2019-05-20 DIAGNOSIS — M1711 Unilateral primary osteoarthritis, right knee: Secondary | ICD-10-CM | POA: Diagnosis not present

## 2019-05-20 NOTE — Progress Notes (Signed)
Subjective:  Patient ID: Cristian Moore, male    DOB: February 04, 1941  Age: 78 y.o. MRN: [id number]  CC: No chief complaint on file.   HPI Cristian Moore presents for Pain in knees 8/10. Hip and back 5-6/10. Improved, but not adequate.The gabapentin is helping. He is taking one tablet three time a day. Currently the pain at the right hip and lower back are 5/10 and the knees are 8/10. Denies side effects including drowsiness and dizziness.  Depression screen [facility] 2/9 05/20/2019 02/04/2019 01/08/2019  Decreased Interest 0 0 0  Down, Depressed, Hopeless - 0 0  PHQ - 2 Score 0 0 0  Altered sleeping 0 0 -  Tired, decreased energy 0 0 -  Change in appetite 0 0 -  Feeling bad or failure about yourself  0 0 -  Trouble concentrating 0 0 -  Moving slowly or fidgety/restless 0 0 -  Suicidal thoughts 0 0 -  PHQ-9 Score 0 0 -  Difficult doing work/chores - - -  Some recent data might be hidden    History Unk has a past medical history of Arthritis, Asthma, Benign prostatic hypertrophy, Carotid stenosis, Chest pain, Chronic Dyspnea on exertion, Diabetes mellitus without complication ([place]) (123456), Difficulty urinating, Diverticulitis, Diverticulosis, Exogenous obesity, GERD (gastroesophageal reflux disease), H/O hiatal hernia, Hernia, Hyperlipidemia, Laceration ( 01/11/2009 ), Musculoskeletal pain, Obstructive sleep apnea, Rectal bleeding, SUPRAVENTRICULAR TACHYCARDIA (08/01/2009), Thyroiditis, Ulcer (2008), and Walking pneumonia.   He has a past surgical history that includes Inguinal hernia repair; Kidney stone surgery; Tonsillectomy; Colonoscopy; Rotator cuff repair; Knee surgery; Elbow bursa surgery; Laparoscopic sigmoid colectomy (07/23/2012); Umbilical hernia repair (07/23/2012); Proctoscopy (07/23/2012); Eyelid Surgery (Bilateral, 05/2013); left heart catheterization with coronary angiogram (N/A, 02/22/2013); and Total hip arthroplasty (Right, 05/27/2018).   His family history includes Heart  disease in his mother; Lung cancer in his maternal uncle; Stroke in his mother.He reports that he quit smoking about 19 years ago. His smoking use included cigars. He has never used smokeless tobacco. He reports that he does not drink alcohol or use drugs.    ROS Review of Systems  Objective:  BP 127/82   Pulse 62   Temp (!) 97.3 F (36.3 C) (Temporal)   Ht 6\' 1"  (1.854 m)   Wt 274 lb (124.3 kg)   SpO2 97%   BMI 36.15 kg/m   BP Readings from Last 3 Encounters:  05/20/19 127/82  05/12/19 120/80  02/04/19 118/73    Wt Readings from Last 3 Encounters:  05/20/19 274 lb (124.3 kg)  05/12/19 274 lb (124.3 kg)  02/04/19 271 lb (122.9 kg)     Physical Exam    Assessment & Plan:   Diagnoses and all orders for this visit:  Primary osteoarthritis of hip, unspecified laterality  Other orders -     gabapentin (NEURONTIN) 300 MG capsule; Take 2 capsules (600 mg total) by mouth 2 (two) times daily.       I have changed Cristian Moore's gabapentin. I am also having him maintain his Vitamin D, Multiple Vitamins-Minerals (CENTRUM SILVER PO), sodium chloride, OneTouch Delica Lancets 99991111, PRESCRIPTION MEDICATION, nitroGLYCERIN, AeroChamber MV, OneTouch Verio, hydroxypropyl methylcellulose / hypromellose, acetaminophen, sucralfate, tamsulosin, Krill Oil, zolpidem, fluticasone, metFORMIN, albuterol, diclofenac, metoprolol tartrate, montelukast, escitalopram, and simvastatin.  Allergies as of 05/20/2019      Reactions   Penicillins Rash   Over 60 years ago Has patient had a PCN reaction causing immediate rash, facial/tongue/throat swelling, SOB or lightheadedness with hypotension: Unknown Has patient  had a PCN reaction causing severe rash involving mucus membranes or skin necrosis: Unknown Has patient had a PCN reaction that required hospitalization: No Has patient had a PCN reaction occurring within the last 10 years: No If all of the above answers are "NO", then may proceed  with Cephalosporin use.      Medication List       Accurate as of May 20, 2019 11:59 PM. If you have any questions, ask your nurse or doctor.        acetaminophen 500 MG tablet Commonly known as: TYLENOL Take 1,000 mg by mouth every 6 (six) hours as needed for moderate pain.   AeroChamber MV inhaler Use as instructed   albuterol 108 (90 Base) MCG/ACT inhaler Commonly known as: VENTOLIN HFA Inhale 2 puffs into the lungs every 6 (six) hours as needed for wheezing or shortness of breath.   CENTRUM SILVER PO Take 1 tablet by mouth daily.   diclofenac 75 MG EC tablet Commonly known as: VOLTAREN Take 1 tablet (75 mg total) by mouth 2 (two) times daily.   escitalopram 20 MG tablet Commonly known as: LEXAPRO Take 1/2 (one-half) tablet by mouth once daily   fluticasone 50 MCG/ACT nasal spray Commonly known as: FLONASE USE 2 SPRAYS IN EACH       NOSTRIL DAILY   gabapentin 300 MG capsule Commonly known as: Neurontin Take 2 capsules (600 mg total) by mouth 2 (two) times daily. What changed: when to take this Changed by: [name], MD   hydroxypropyl methylcellulose / hypromellose 2.5 % ophthalmic solution Commonly known as: ISOPTO TEARS / GONIOVISC Place 1 drop into both eyes daily as needed for dry eyes.   Krill Oil 350 MG Caps Take 350 mg by mouth 2 (two) times daily.   metFORMIN 500 MG tablet Commonly known as: GLUCOPHAGE Take 1 tablet (500 mg total) by mouth daily with breakfast.   metoprolol tartrate 50 MG tablet Commonly known as: LOPRESSOR Take 1 tablet (50 mg total) by mouth 2 (two) times daily.   montelukast 10 MG tablet Commonly known as: SINGULAIR Take 1 tablet (10 mg total) by mouth at bedtime.   nitroGLYCERIN 0.4 MG SL tablet Commonly known as: Nitrostat DISSOLVE ONE TABLET UNDER THE TONGUE EVERY 5 MINUTES AS NEEDED FOR CHEST PAIN.  DO NOT EXCEED A TOTAL OF 3 DOSES IN 15 MINUTES What changed:   how much to take  how to take this  when to  take this  reasons to take this   OneTouch Delica Lancets 99991111 Misc Check BS BID and PRN. DX. E11.8   OneTouch Verio test strip Generic drug: glucose blood USE TO CHECK BLOOD SUGAR TWICE DAILY AND AS NEEDED   PRESCRIPTION MEDICATION Inhale 1 application into the lungs at bedtime. "CPAP" MACHINE   simvastatin 20 MG tablet Commonly known as: ZOCOR Take 20 mg by mouth daily.   sodium chloride 0.65 % nasal spray Commonly known as: OCEAN Place 1 spray into the nose daily as needed for congestion.   sucralfate 1 g tablet Commonly known as: CARAFATE Take 1 tablet (1 g total) by mouth daily as needed (stomach coating).   tamsulosin 0.4 MG Caps capsule Commonly known as: FLOMAX TAKE 2 CAPSULES (0.8MG      TOTAL) DAILY AFTER SUPPER What changed: See the new instructions.   Vitamin D 50 MCG (2000 UT) tablet Take 2,000 Units by mouth daily.   zolpidem 10 MG tablet Commonly known as: AMBIEN Take 1 tablet (10 mg total)  by mouth at bedtime.      Increase gabapentin to 600 mg BID.   Follow-up: Return in about 3 months (around 08/20/2019).  [name], M.D.

## 2019-05-24 ENCOUNTER — Encounter: Payer: Self-pay | Admitting: Family Medicine

## 2019-05-24 MED ORDER — GABAPENTIN 300 MG PO CAPS: 600.0000 mg | ORAL_CAPSULE | Freq: Two times a day (BID) | ORAL | 1 refills | Status: DC

## 2019-05-31 ENCOUNTER — Telehealth: Payer: Self-pay | Admitting: Family Medicine

## 2019-05-31 MED ORDER — GABAPENTIN 300 MG PO CAPS: 600.0000 mg | ORAL_CAPSULE | Freq: Two times a day (BID) | ORAL | 1 refills | Status: DC

## 2019-05-31 NOTE — Telephone Encounter (Signed)
Pt called and aware - rx printed and faxed

## 2019-06-30 ENCOUNTER — Telehealth: Payer: Self-pay | Admitting: Family Medicine

## 2019-07-28 DIAGNOSIS — Z23 Encounter for immunization: Secondary | ICD-10-CM | POA: Diagnosis not present

## 2019-08-06 ENCOUNTER — Other Ambulatory Visit: Payer: Self-pay | Admitting: Family Medicine

## 2019-08-06 NOTE — Telephone Encounter (Signed)
Zolpidem 10 mg Patient was seen Nov 19th and was told to come in 3 months. Has appt 2-22 and he will out today and needs enough get through til his appt. Want's #90 because it will be cheaper Please advise

## 2019-08-06 NOTE — Telephone Encounter (Signed)
What is the name of the medication? Cristian Moore 10 mg Patient was seen Nov 19th and was told to come in 3 months. Has appt 2-22 and he will out today and needs enough get through til his appt. Want's #90 because it will be cheaper  Have you contacted your pharmacy to request a refill? yes  Which pharmacy would you like this sent to? [facility]   Patient notified that their request is being sent to the clinical staff for review and that they should receive a call once it is complete. If they do not receive a call within 24 hours they can check with their pharmacy or our office.

## 2019-08-08 MED ORDER — ZOLPIDEM TARTRATE 10 MG PO TABS: 10.0000 mg | ORAL_TABLET | Freq: Every day | ORAL | 1 refills | Status: DC

## 2019-08-09 ENCOUNTER — Other Ambulatory Visit: Payer: Self-pay | Admitting: Family Medicine

## 2019-08-20 ENCOUNTER — Other Ambulatory Visit: Payer: Self-pay

## 2019-08-23 ENCOUNTER — Encounter: Payer: Self-pay | Admitting: Family Medicine

## 2019-08-23 ENCOUNTER — Other Ambulatory Visit: Payer: Self-pay

## 2019-08-23 ENCOUNTER — Ambulatory Visit (INDEPENDENT_AMBULATORY_CARE_PROVIDER_SITE_OTHER): Payer: Medicare HMO | Admitting: Family Medicine

## 2019-08-23 VITALS — BP 107/62 | HR 54 | Temp 98.6°F | Ht 73.0 in | Wt 275.4 lb

## 2019-08-23 DIAGNOSIS — E039 Hypothyroidism, unspecified: Secondary | ICD-10-CM

## 2019-08-23 DIAGNOSIS — E559 Vitamin D deficiency, unspecified: Secondary | ICD-10-CM

## 2019-08-23 DIAGNOSIS — E785 Hyperlipidemia, unspecified: Secondary | ICD-10-CM | POA: Diagnosis not present

## 2019-08-23 DIAGNOSIS — M1610 Unilateral primary osteoarthritis, unspecified hip: Secondary | ICD-10-CM | POA: Diagnosis not present

## 2019-08-23 DIAGNOSIS — I2510 Atherosclerotic heart disease of native coronary artery without angina pectoris: Secondary | ICD-10-CM

## 2019-08-23 DIAGNOSIS — E118 Type 2 diabetes mellitus with unspecified complications: Secondary | ICD-10-CM | POA: Diagnosis not present

## 2019-08-23 DIAGNOSIS — I10 Essential (primary) hypertension: Secondary | ICD-10-CM

## 2019-08-23 DIAGNOSIS — N400 Enlarged prostate without lower urinary tract symptoms: Secondary | ICD-10-CM

## 2019-08-23 DIAGNOSIS — M79672 Pain in left foot: Secondary | ICD-10-CM

## 2019-08-23 DIAGNOSIS — K219 Gastro-esophageal reflux disease without esophagitis: Secondary | ICD-10-CM

## 2019-08-23 DIAGNOSIS — M79671 Pain in right foot: Secondary | ICD-10-CM

## 2019-08-23 DIAGNOSIS — M79605 Pain in left leg: Secondary | ICD-10-CM

## 2019-08-23 DIAGNOSIS — M79604 Pain in right leg: Secondary | ICD-10-CM

## 2019-08-23 DIAGNOSIS — E782 Mixed hyperlipidemia: Secondary | ICD-10-CM | POA: Diagnosis not present

## 2019-08-23 HISTORY — DX: Hypothyroidism, unspecified: E03.9

## 2019-08-23 LAB — BAYER DCA HB A1C WAIVED: HB A1C (BAYER DCA - WAIVED): 5.8 % (ref <7.0)

## 2019-08-23 MED ORDER — GABAPENTIN 300 MG PO CAPS: 600.0000 mg | ORAL_CAPSULE | Freq: Two times a day (BID) | ORAL | 1 refills | Status: DC

## 2019-08-23 MED ORDER — METFORMIN HCL 500 MG PO TABS: 500.0000 mg | ORAL_TABLET | Freq: Every day | ORAL | 1 refills | Status: DC

## 2019-08-23 MED ORDER — METOPROLOL TARTRATE 50 MG PO TABS: 50.0000 mg | ORAL_TABLET | Freq: Two times a day (BID) | ORAL | 1 refills | Status: DC

## 2019-08-23 MED ORDER — TAMSULOSIN HCL 0.4 MG PO CAPS: ORAL_CAPSULE | ORAL | 1 refills | Status: DC

## 2019-08-23 MED ORDER — GABAPENTIN 300 MG PO CAPS: 900.0000 mg | ORAL_CAPSULE | Freq: Two times a day (BID) | ORAL | 1 refills | Status: DC

## 2019-08-23 MED ORDER — ESCITALOPRAM OXALATE 20 MG PO TABS: ORAL_TABLET | ORAL | 3 refills | Status: DC

## 2019-08-23 MED ORDER — SUCRALFATE 1 G PO TABS: 1.0000 g | ORAL_TABLET | Freq: Every day | ORAL | 0 refills | Status: DC | PRN

## 2019-08-23 NOTE — Progress Notes (Signed)
Subjective:  Patient ID: Cristian Moore, male    DOB: 1940-10-08  Age: 79 y.o. MRN: [id number]  CC: Follow-up   HPI DAYLAN JUHNKE presents for Having increased pain. Gabapentin not sufficient.   Patient presents for follow-up on  thyroid. The patient has a history of hypothyroidism for many years. It has been stable recently. Pt. denies any change in  voice, loss of hair, heat or cold intolerance. Energy level has been adequate to good. Patient denies constipation and diarrhea. No myxedema. Medication is as noted below. Verified that pt is taking it daily on an empty stomach. Well tolerated.   Follow-up of hypertension. Patient has no history of headache chest pain or shortness of breath or recent cough. Patient also denies symptoms of TIA such as numbness weakness lateralizing. Patient checks  blood pressure at home and has not had any elevated readings recently. Patient denies side effects from his medication. States taking it regularly.  Patient in for follow-up of elevated cholesterol. Doing well without complaints on current medication. Denies side effects of statin including myalgia and arthralgia and nausea. Also in today for liver function testing. Currently no chest pain, shortness of breath or other cardiovascular related symptoms noted.  Patient has aortic atherosclerosis.Denies chest pain recently.      Depression screen [facility] 2/9 08/23/2019 05/20/2019 02/04/2019  Decreased Interest 0 0 0  Down, Depressed, Hopeless 0 - 0  PHQ - 2 Score 0 0 0  Altered sleeping - 0 0  Tired, decreased energy - 0 0  Change in appetite - 0 0  Feeling bad or failure about yourself  - 0 0  Trouble concentrating - 0 0  Moving slowly or fidgety/restless - 0 0  Suicidal thoughts - 0 0  PHQ-9 Score - 0 0  Difficult doing work/chores - - -  Some recent data might be hidden    History Leldon has a past medical history of Arthritis, Asthma, Benign prostatic hypertrophy, Carotid stenosis, Chest  pain, Chronic Dyspnea on exertion, Diabetes mellitus without complication ([place]) (7681), Difficulty urinating, Diverticulitis, Diverticulosis, Exogenous obesity, GERD (gastroesophageal reflux disease), H/O hiatal hernia, Hernia, Hyperlipidemia, Laceration ( 01/11/2009 ), Musculoskeletal pain, Obstructive sleep apnea, Rectal bleeding, SUPRAVENTRICULAR TACHYCARDIA (08/01/2009), Thyroiditis, Ulcer (2008), and Walking pneumonia.   He has a past surgical history that includes Inguinal hernia repair; Kidney stone surgery; Tonsillectomy; Colonoscopy; Rotator cuff repair; Knee surgery; Elbow bursa surgery; Laparoscopic sigmoid colectomy (07/23/2012); Umbilical hernia repair (07/23/2012); Proctoscopy (07/23/2012); Eyelid Surgery (Bilateral, 05/2013); left heart catheterization with coronary angiogram (N/A, 02/22/2013); and Total hip arthroplasty (Right, 05/27/2018).   His family history includes Heart disease in his mother; Lung cancer in his maternal uncle; Stroke in his mother.He reports that he quit smoking about 20 years ago. His smoking use included cigars. He has never used smokeless tobacco. He reports that he does not drink alcohol or use drugs.    ROS Review of Systems  Constitutional: Negative for fever.  Respiratory: Negative for shortness of breath.   Cardiovascular: Negative for chest pain.  Musculoskeletal: Negative for arthralgias.  Skin: Negative for rash.    Objective:  BP 107/62   Pulse (!) 54   Temp 98.6 F (37 C) (Temporal)   Ht '6\' 1"'$  (1.854 m)   Wt 275 lb 6.4 oz (124.9 kg)   BMI 36.33 kg/m   BP Readings from Last 3 Encounters:  08/23/19 107/62  05/20/19 127/82  05/12/19 120/80    Wt Readings from Last 3 Encounters:  08/23/19 275  lb 6.4 oz (124.9 kg)  05/20/19 274 lb (124.3 kg)  05/12/19 274 lb (124.3 kg)     Physical Exam Vitals reviewed.  Constitutional:      Appearance: He is well-developed.  HENT:     Head: Normocephalic and atraumatic.     Right Ear: Tympanic  membrane and external ear normal. No decreased hearing noted.     Left Ear: Tympanic membrane and external ear normal. No decreased hearing noted.     Mouth/Throat:     Pharynx: No oropharyngeal exudate or posterior oropharyngeal erythema.  Eyes:     Pupils: Pupils are equal, round, and reactive to light.  Cardiovascular:     Rate and Rhythm: Normal rate and regular rhythm.     Heart sounds: No murmur.  Pulmonary:     Effort: No respiratory distress.     Breath sounds: Normal breath sounds.  Abdominal:     General: Bowel sounds are normal.     Palpations: Abdomen is soft. There is no mass.     Tenderness: There is no abdominal tenderness.  Musculoskeletal:     Cervical back: Normal range of motion and neck supple.       Assessment & Plan:   Jac was seen today for follow-up.  Diagnoses and all orders for this visit:  Essential hypertension -     CBC with Differential/Platelet -     CMP14+EGFR -     Bayer DCA Hb A1c Waived -     metoprolol tartrate (LOPRESSOR) 50 MG tablet; Take 1 tablet (50 mg total) by mouth 2 (two) times daily.  DM (diabetes mellitus), type 2 with complications (HCC) -     CBC with Differential/Platelet -     CMP14+EGFR -     Bayer DCA Hb A1c Waived -     Microalbumin / creatinine urine ratio  Hypothyroidism, unspecified type -     CBC with Differential/Platelet -     CMP14+EGFR -     Thyroid Panel With TSH -     Bayer DCA Hb A1c Waived  Vitamin D deficiency -     CBC with Differential/Platelet -     CMP14+EGFR -     Bayer DCA Hb A1c Waived  Type 2 diabetes mellitus with complication, without long-term current use of insulin (HCC) -     CBC with Differential/Platelet -     CMP14+EGFR -     Bayer DCA Hb A1c Waived -     Microalbumin / creatinine urine ratio -     metFORMIN (GLUCOPHAGE) 500 MG tablet; Take 1 tablet (500 mg total) by mouth daily with breakfast.  Non-Obstructive CAD -     metoprolol tartrate (LOPRESSOR) 50 MG tablet; Take  1 tablet (50 mg total) by mouth 2 (two) times daily.  Mixed hyperlipidemia  Benign prostatic hyperplasia, unspecified whether lower urinary tract symptoms present -     tamsulosin (FLOMAX) 0.4 MG CAPS capsule; TAKE 2 CAPSULES DAILY AFTER SUPPER  Primary osteoarthritis of hip, unspecified laterality  Dyslipidemia  Gastroesophageal reflux disease without esophagitis -     sucralfate (CARAFATE) 1 g tablet; Take 1 tablet (1 g total) by mouth daily as needed (stomach coating).  Bilateral leg and foot pain -     gabapentin (NEURONTIN) 300 MG capsule; Take 3 capsules (900 mg total) by mouth 2 (two) times daily.  Other orders -     Discontinue: gabapentin (NEURONTIN) 300 MG capsule; Take 2 capsules (600 mg total) by mouth 2 (two)  times daily. -     escitalopram (LEXAPRO) 20 MG tablet; Take 1/2 (one-half) tablet by mouth once daily       I have discontinued Izekiel B. Cichy's gabapentin. I have also changed his tamsulosin and gabapentin. Additionally, I am having him maintain his Vitamin D, Multiple Vitamins-Minerals (CENTRUM SILVER PO), sodium chloride, OneTouch Delica Lancets 05L, PRESCRIPTION MEDICATION, nitroGLYCERIN, AeroChamber MV, OneTouch Verio, hydroxypropyl methylcellulose / hypromellose, acetaminophen, Krill Oil, fluticasone, albuterol, diclofenac, montelukast, simvastatin, zolpidem, metoprolol tartrate, metFORMIN, escitalopram, and sucralfate.  Allergies as of 08/23/2019      Reactions   Penicillins Rash   Over 60 years ago Has patient had a PCN reaction causing immediate rash, facial/tongue/throat swelling, SOB or lightheadedness with hypotension: Unknown Has patient had a PCN reaction causing severe rash involving mucus membranes or skin necrosis: Unknown Has patient had a PCN reaction that required hospitalization: No Has patient had a PCN reaction occurring within the last 10 years: No If all of the above answers are "NO", then may proceed with Cephalosporin use.        Medication List       Accurate as of August 23, 2019 10:00 PM. If you have any questions, ask your nurse or doctor.        acetaminophen 500 MG tablet Commonly known as: TYLENOL Take 1,000 mg by mouth every 6 (six) hours as needed for moderate pain.   AeroChamber MV inhaler Use as instructed   albuterol 108 (90 Base) MCG/ACT inhaler Commonly known as: VENTOLIN HFA Inhale 2 puffs into the lungs every 6 (six) hours as needed for wheezing or shortness of breath.   CENTRUM SILVER PO Take 1 tablet by mouth daily.   diclofenac 75 MG EC tablet Commonly known as: VOLTAREN Take 1 tablet (75 mg total) by mouth 2 (two) times daily.   escitalopram 20 MG tablet Commonly known as: LEXAPRO Take 1/2 (one-half) tablet by mouth once daily   fluticasone 50 MCG/ACT nasal spray Commonly known as: FLONASE USE 2 SPRAYS IN EACH       NOSTRIL DAILY   gabapentin 300 MG capsule Commonly known as: Neurontin Take 3 capsules (900 mg total) by mouth 2 (two) times daily. What changed: how much to take Changed by: [name], MD   hydroxypropyl methylcellulose / hypromellose 2.5 % ophthalmic solution Commonly known as: ISOPTO TEARS / GONIOVISC Place 1 drop into both eyes daily as needed for dry eyes.   Krill Oil 350 MG Caps Take 350 mg by mouth 2 (two) times daily.   metFORMIN 500 MG tablet Commonly known as: GLUCOPHAGE Take 1 tablet (500 mg total) by mouth daily with breakfast.   metoprolol tartrate 50 MG tablet Commonly known as: LOPRESSOR Take 1 tablet (50 mg total) by mouth 2 (two) times daily.   montelukast 10 MG tablet Commonly known as: SINGULAIR Take 1 tablet (10 mg total) by mouth at bedtime.   nitroGLYCERIN 0.4 MG SL tablet Commonly known as: Nitrostat DISSOLVE ONE TABLET UNDER THE TONGUE EVERY 5 MINUTES AS NEEDED FOR CHEST PAIN.  DO NOT EXCEED A TOTAL OF 3 DOSES IN 15 MINUTES   OneTouch Delica Lancets 97Q Misc Check BS BID and PRN. DX. E11.8   OneTouch Verio test  strip Generic drug: glucose blood USE TO CHECK BLOOD SUGAR TWICE DAILY AND AS NEEDED   PRESCRIPTION MEDICATION Inhale 1 application into the lungs at bedtime. "CPAP" MACHINE   simvastatin 20 MG tablet Commonly known as: ZOCOR Take 20 mg by mouth daily.  sodium chloride 0.65 % nasal spray Commonly known as: OCEAN Place 1 spray into the nose daily as needed for congestion.   sucralfate 1 g tablet Commonly known as: CARAFATE Take 1 tablet (1 g total) by mouth daily as needed (stomach coating).   tamsulosin 0.4 MG Caps capsule Commonly known as: FLOMAX TAKE 2 CAPSULES DAILY AFTER SUPPER What changed: See the new instructions. Changed by: [name], MD   Vitamin D 50 MCG (2000 UT) tablet Take 2,000 Units by mouth daily.   zolpidem 10 MG tablet Commonly known as: AMBIEN Take 1 tablet (10 mg total) by mouth at bedtime.        Follow-up: Return in about 6 months (around 02/20/2020).  [name], M.D.

## 2019-08-24 LAB — CBC WITH DIFFERENTIAL/PLATELET
Basophils Absolute: 0.1 10*3/uL (ref 0.0–0.2)
Basos: 2 %
EOS (ABSOLUTE): 0.2 10*3/uL (ref 0.0–0.4)
Eos: 3 %
Hematocrit: 40.8 % (ref 37.5–51.0)
Hemoglobin: 14.2 g/dL (ref 13.0–17.7)
Immature Grans (Abs): 0 10*3/uL (ref 0.0–0.1)
Immature Granulocytes: 0 %
Lymphocytes Absolute: 2.2 10*3/uL (ref 0.7–3.1)
Lymphs: 27 %
MCH: 33.2 pg — ABNORMAL HIGH (ref 26.6–33.0)
MCHC: 34.8 g/dL (ref 31.5–35.7)
MCV: 95 fL (ref 79–97)
Monocytes Absolute: 1 10*3/uL — ABNORMAL HIGH (ref 0.1–0.9)
Monocytes: 12 %
Neutrophils Absolute: 4.6 10*3/uL (ref 1.4–7.0)
Neutrophils: 56 %
Platelets: 260 10*3/uL (ref 150–450)
RBC: 4.28 x10E6/uL (ref 4.14–5.80)
RDW: 12.3 % (ref 11.6–15.4)
WBC: 8.3 10*3/uL (ref 3.4–10.8)

## 2019-08-24 LAB — CMP14+EGFR
ALT: 15 IU/L (ref 0–44)
AST: 22 IU/L (ref 0–40)
Albumin/Globulin Ratio: 1.8 (ref 1.2–2.2)
Albumin: 4.2 g/dL (ref 3.7–4.7)
Alkaline Phosphatase: 39 IU/L (ref 39–117)
BUN/Creatinine Ratio: 12 (ref 10–24)
BUN: 15 mg/dL (ref 8–27)
Bilirubin Total: 0.4 mg/dL (ref 0.0–1.2)
CO2: 26 mmol/L (ref 20–29)
Calcium: 9.9 mg/dL (ref 8.6–10.2)
Chloride: 102 mmol/L (ref 96–106)
Creatinine, Ser: 1.22 mg/dL (ref 0.76–1.27)
GFR calc Af Amer: 65 mL/min/{1.73_m2} (ref >59)
GFR calc non Af Amer: 56 mL/min/{1.73_m2} — ABNORMAL LOW (ref >59)
Globulin, Total: 2.3 g/dL (ref 1.5–4.5)
Glucose: 103 mg/dL — ABNORMAL HIGH (ref 65–99)
Potassium: 4.8 mmol/L (ref 3.5–5.2)
Sodium: 141 mmol/L (ref 134–144)
Total Protein: 6.5 g/dL (ref 6.0–8.5)

## 2019-08-24 LAB — THYROID PANEL WITH TSH
Free Thyroxine Index: 1.1 — ABNORMAL LOW (ref 1.2–4.9)
T3 Uptake Ratio: 24 % (ref 24–39)
T4, Total: 4.6 ug/dL (ref 4.5–12.0)
TSH: 0.748 u[IU]/mL (ref 0.450–4.500)

## 2019-08-24 LAB — MICROALBUMIN / CREATININE URINE RATIO
Creatinine, Urine: 67.9 mg/dL
Microalb/Creat Ratio: <4 mg/g creat (ref 0–29)
Microalbumin, Urine: <3 ug/mL

## 2019-08-24 NOTE — Progress Notes (Signed)
Hello Trelon,  Your lab result is normal and/or stable.Some minor variations that are not significant are commonly marked abnormal, but do not represent any medical problem for you.  Best regards, Ayodele Hartsock, M.D.

## 2019-08-25 DIAGNOSIS — Z23 Encounter for immunization: Secondary | ICD-10-CM | POA: Diagnosis not present

## 2019-08-30 ENCOUNTER — Ambulatory Visit (INDEPENDENT_AMBULATORY_CARE_PROVIDER_SITE_OTHER): Payer: Medicare HMO | Admitting: *Deleted

## 2019-08-30 DIAGNOSIS — Z0000 Encounter for general adult medical examination without abnormal findings: Secondary | ICD-10-CM

## 2019-08-30 NOTE — Progress Notes (Signed)
MEDICARE ANNUAL WELLNESS VISIT  08/30/2019  Telephone Visit Disclaimer This Medicare AWV was conducted by telephone due to national recommendations for restrictions regarding the COVID-19 Pandemic (e.g. social distancing).  I verified, using two identifiers, that I am speaking with Cristian Moore or their authorized healthcare agent. I discussed the limitations, risks, security, and privacy concerns of performing an evaluation and management service by telephone and the potential availability of an in-person appointment in the future. The patient expressed understanding and agreed to proceed.   Subjective:  Cristian Moore is a 79 y.o. male patient of Stacks, Cletus Gash, MD who had a Medicare Annual Wellness Visit today via telephone. Cristian Moore is Retired and lives with their spouse. he has 4 biologic children and 1 stepchild. he reports that he is socially active and does interact with friends/family regularly. 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  Patient Care Team: [name], MD as PCP - General (Family Medicine) [name], MD as PCP - Cardiology (Cardiology) Gatha Mayer, MD as Consulting Physician (Gastroenterology) [name], MD as Consulting Physician (Cardiology) Anda Kraft, MD as Consulting Physician (Endocrinology) Tanda Rockers, MD as Consulting Physician (Pulmonary Disease) Netta Cedars, MD as Consulting Physician (Orthopedic Surgery) Suella Broad, MD as Consulting Physician (Physical Medicine and Rehabilitation)  Advanced Directives 08/30/2019 08/27/2018 05/27/2018 05/19/2018 05/30/2017 03/18/2015 10/13/2014  Does Patient Have a Medical Advance Directive? No No No No No No No  Would patient like information on creating a medical advance directive? No - Patient declined No - Patient declined No - Patient declined No - Patient declined - - -  Pre-existing out of facility DNR  order (yellow form or pink MOST form) - - - - - - -    Hospital Utilization Over the Past 12 Months: # of hospitalizations or ER visits: 0 # of surgeries: 0  Review of Systems    Patient reports that his overall health is unchanged compared to last year.  History obtained from chart review  Patient Reported Readings (BP, Pulse, CBG, Weight, etc) none  Pain Assessment Pain : No/denies pain     Current Medications & Allergies (verified) Allergies as of 08/30/2019      Reactions   Penicillins Rash   Over 60 years ago Has patient had a PCN reaction causing immediate rash, facial/tongue/throat swelling, SOB or lightheadedness with hypotension: Unknown Has patient had a PCN reaction causing severe rash involving mucus membranes or skin necrosis: Unknown Has patient had a PCN reaction that required hospitalization: No Has patient had a PCN reaction occurring within the last 10 years: No If all of the above answers are "NO", then may proceed with Cephalosporin use.      Medication List       Accurate as of August 30, 2019 11:20 AM. If you have any questions, ask your nurse or doctor.        STOP taking these medications   diclofenac 75 MG EC tablet Commonly known as: VOLTAREN     TAKE these medications   acetaminophen 500 MG tablet Commonly known as: TYLENOL Take 1,000 mg by mouth every 6 (six) hours as needed for moderate pain.   AeroChamber MV inhaler Use as instructed   albuterol 108 (90 Base) MCG/ACT inhaler Commonly known as: VENTOLIN HFA Inhale 2 puffs into the lungs every 6 (six) hours as needed for wheezing or shortness of breath.   CENTRUM SILVER  PO Take 1 tablet by mouth daily.   escitalopram 20 MG tablet Commonly known as: LEXAPRO Take 1/2 (one-half) tablet by mouth once daily   fluticasone 50 MCG/ACT nasal spray Commonly known as: FLONASE USE 2 SPRAYS IN EACH       NOSTRIL DAILY   gabapentin 300 MG capsule Commonly known as: Neurontin Take 3  capsules (900 mg total) by mouth 2 (two) times daily.   hydroxypropyl methylcellulose / hypromellose 2.5 % ophthalmic solution Commonly known as: ISOPTO TEARS / GONIOVISC Place 1 drop into both eyes daily as needed for dry eyes.   Krill Oil 350 MG Caps Take 350 mg by mouth 2 (two) times daily.   metFORMIN 500 MG tablet Commonly known as: GLUCOPHAGE Take 1 tablet (500 mg total) by mouth daily with breakfast.   metoprolol tartrate 50 MG tablet Commonly known as: LOPRESSOR Take 1 tablet (50 mg total) by mouth 2 (two) times daily.   montelukast 10 MG tablet Commonly known as: SINGULAIR Take 1 tablet (10 mg total) by mouth at bedtime.   nitroGLYCERIN 0.4 MG SL tablet Commonly known as: Nitrostat DISSOLVE ONE TABLET UNDER THE TONGUE EVERY 5 MINUTES AS NEEDED FOR CHEST PAIN.  DO NOT EXCEED A TOTAL OF 3 DOSES IN 15 MINUTES   OneTouch Delica Lancets 99991111 Misc Check BS BID and PRN. DX. E11.8   OneTouch Verio test strip Generic drug: glucose blood USE TO CHECK BLOOD SUGAR TWICE DAILY AND AS NEEDED   PRESCRIPTION MEDICATION Inhale 1 application into the lungs at bedtime. "CPAP" MACHINE   simvastatin 20 MG tablet Commonly known as: ZOCOR Take 20 mg by mouth daily.   sodium chloride 0.65 % nasal spray Commonly known as: OCEAN Place 1 spray into the nose daily as needed for congestion.   sucralfate 1 g tablet Commonly known as: CARAFATE Take 1 tablet (1 g total) by mouth daily as needed (stomach coating).   tamsulosin 0.4 MG Caps capsule Commonly known as: FLOMAX TAKE 2 CAPSULES DAILY AFTER SUPPER   Vitamin D 50 MCG (2000 UT) tablet Take 2,000 Units by mouth daily.   zolpidem 10 MG tablet Commonly known as: AMBIEN Take 1 tablet (10 mg total) by mouth at bedtime.       History (reviewed): Past Medical History:  Diagnosis Date  . Arthritis   . Asthma   . Benign prostatic hypertrophy   . Carotid stenosis    Carotid US (07/2013):  Bilateral 1-39% ICA (f/u 2 yrs)  .  Chest pain    a. 01/2013 Cath: LM nl, LAD 25p, D1 25p, LCX nl, RCA 25p, PDA nl, RPL nl, EF 55%;  b. 03/2015 MV: EF 59%, no ischemia; c. 03/2016 MV: basal inf/mid inf defect, likely artifact, EF 48%, low risk.  . Chronic Dyspnea on exertion    a. See cardiac eval under CAD heading;  b. 03/2015 Echo: EF 50-55%, gr1 DD, triv AI;  b. 03/2015 CTA Chest: No PE;  c. 05/2016 CPX: no significant cardi-pulmonary limitation noted. Suspect obesity contributing to exertional symptoms.  . Diabetes mellitus without complication ([place]) 123456  . Difficulty urinating   . Diverticulitis   . Diverticulosis   . Exogenous obesity   . GERD (gastroesophageal reflux disease)   . H/O hiatal hernia   . Hernia    Umbulical   . Hyperlipidemia   . Laceration  01/11/2009    Deep laceration left index finger dorsally  . Musculoskeletal pain    in the right shoulder - S/P ROTATOR  CUFF REPAIR-BUT STILL HAS PAIN  . Obstructive sleep apnea     CPAP    AUTO SET 6 TO 20 - USUALLY SETTLES OUT AT 10  . Rectal bleeding    PT ATTRIBUTES TO HEMORRHOIDS  . SUPRAVENTRICULAR TACHYCARDIA 08/01/2009   Qualifier: Diagnosis of  By: Lovette Cliche, CNA, Christy    . Thyroiditis   . Ulcer 2008  . Walking pneumonia    Past Surgical History:  Procedure Laterality Date  . COLONOSCOPY     for polyps  . ELBOW BURSA SURGERY    . Eyelid Surgery Bilateral 05/2013  . INGUINAL HERNIA REPAIR    . KIDNEY STONE SURGERY    . KNEE SURGERY     meniscus right knee  . LAPAROSCOPIC SIGMOID COLECTOMY  07/23/2012   Procedure: LAPAROSCOPIC SIGMOID COLECTOMY;  Surgeon: [name], MD;  Location: [facility];  Service: General;  Laterality: N/A;  Laparoscopic Sigmoid Colectomy  . LEFT HEART CATHETERIZATION WITH CORONARY ANGIOGRAM N/A 02/22/2013   Procedure: LEFT HEART CATHETERIZATION WITH CORONARY ANGIOGRAM;  Surgeon: [name], MD;  Location: [facility] CATH LAB;  Service: Cardiovascular;  Laterality: N/A;  . PROCTOSCOPY  07/23/2012   Procedure: PROCTOSCOPY;  Surgeon:  [name], MD;  Location: [facility];  Service: General;  Laterality: N/A;  Rigid Proctoscopy  . ROTATOR CUFF REPAIR    . TONSILLECTOMY    . TOTAL HIP ARTHROPLASTY Right 05/27/2018   Procedure: RIGHT TOTAL HIP ARTHROPLASTY ANTERIOR APPROACH;  Surgeon: [name], MD;  Location: [facility];  Service: Orthopedics;  Laterality: Right;  146min  . UMBILICAL HERNIA REPAIR  07/23/2012   Procedure: HERNIA REPAIR UMBILICAL ADULT;  Surgeon: [name], MD;  Location: [facility];  Service: General;  Laterality: N/A;  Primary Umbilical Hernia Repair   Family History  Problem Relation Age of Onset  . Stroke Mother   . Heart disease Mother        Valvular heart disease, Rheumatic fever  . Lung cancer Maternal Uncle    Social History   Socioeconomic History  . Marital status: Married    Spouse name: Alice  . Number of children: 4  . Years of education: 48  . Highest education level: Some college, no degree  Occupational History  . Occupation: Retired  Tobacco Use  . Smoking status: Former Smoker    Types: Cigars    Quit date: 08/12/1999    Years since quitting: 20.0  . Smokeless tobacco: Never Used  . Tobacco comment: smoked cigars on and off for approx 4-5 yrs  Substance and Sexual Activity  . Alcohol use: No  . Drug use: No  . Sexual activity: Yes  Other Topics Concern  . Not on file  Social History Narrative   Lives at home with wife      [facility] (09/16/16):   Originally from [facility]. Previously served & lived in [place], [place], & [place]. 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 He also worked in [employer]. No pets currently. Remote bird exposure. No mold or hot tub exposure. Enjoys playing banjo as well as hunting. He also raises cattle. Has carpet in his bedroom. No feather bedding. No indoor plants.    Social Determinants of Health   Financial Resource Strain: Low Risk   . Difficulty of Paying Living Expenses:  Not hard at all  Food Insecurity: No Food Insecurity  . Worried About Charity fundraiser  in the Last Year: Never true  . Ran Out of Food in the Last Year: Never true  Transportation Needs: No Transportation Needs  . Lack of Transportation (Medical): No  . Lack of Transportation (Non-Medical): No  Physical Activity: Inactive  . Days of Exercise per Week: 0 days  . Minutes of Exercise per Session: 0 min  Stress: No Stress Concern Present  . Feeling of Stress : Not at all  Social Connections: Not Isolated  . Frequency of Communication with Friends and Family: More than three times a week  . Frequency of Social Gatherings with Friends and Family: More than three times a week  . Attends Religious Services: More than 4 times per year  . Active Member of Clubs or Organizations: Yes  . Attends Archivist Meetings: More than 4 times per year  . Marital Status: Married    Activities of Daily Living In your present state of health, do you have any difficulty performing the following activities: 08/30/2019  Hearing? Y  Comment wears bilateral hearing aids  Vision? N  Comment wears glasses-[facility]  Difficulty concentrating or making decisions? N  Walking or climbing stairs? N  Dressing or bathing? N  Doing errands, shopping? N  Preparing Food and eating ? N  Using the Toilet? N  In the past six months, have you accidently leaked urine? N  Do you have problems with loss of bowel control? N  Managing your Medications? N  Managing your Finances? N  Housekeeping or managing your Housekeeping? N  Some recent data might be hidden    Patient Education/ Literacy Moore often do you need to have someone help you when you read instructions, pamphlets, or other written materials from your doctor or pharmacy?: 1 - Never What is the last grade level you completed in school?: Some College-No Degree  Exercise Current Exercise Habits: The  patient does not participate in regular exercise at present, Exercise limited by: orthopedic condition(s);respiratory conditions(s)  Diet Patient reports consuming 3 meals a day and 0 snack(s) a day Patient reports that his primary diet is: Regular Patient reports that she does have regular access to food.   Depression Screen PHQ 2/9 Scores 08/30/2019 08/23/2019 05/20/2019 02/04/2019 01/08/2019 08/27/2018 07/24/2018  PHQ - 2 Score 0 0 0 0 0 0 0  PHQ- 9 Score 1 - 0 0 - - -     Fall Risk Fall Risk  08/30/2019 08/23/2019 05/20/2019 02/04/2019 01/08/2019  Falls in the past year? 1 1 0 0 0  Number falls in past yr: 0 0 - - -  Injury with Fall? 0 0 - - -  Comment - - - - -  Risk for fall due to : - History of fall(s) - - -  Follow up - Falls evaluation completed - - -     Objective:  Cristian Moore seemed alert and oriented and he participated appropriately during our telephone visit.  Blood Pressure Weight BMI  BP Readings from Last 3 Encounters:  08/23/19 107/62  05/20/19 127/82  05/12/19 120/80   Wt Readings from Last 3 Encounters:  08/23/19 275 lb 6.4 oz (124.9 kg)  05/20/19 274 lb (124.3 kg)  05/12/19 274 lb (124.3 kg)   BMI Readings from Last 1 Encounters:  08/23/19 36.33 kg/m    *Unable to obtain current vital signs, weight, and BMI due to telephone visit type  Hearing/Vision  . [place] did not seem  to have difficulty with hearing/understanding during the telephone conversation . Reports that he has not had a formal eye exam by an eye care professional within the past year . Reports that he has not had a formal hearing evaluation within the past year *Unable to fully assess hearing and vision during telephone visit type  Cognitive Function: 6CIT Screen 08/30/2019  What Year? 0 points  What month? 0 points  What time? 0 points  Count back from 20 0 points  Months in reverse 0 points  Repeat phrase 2 points  Total Score 2   (Normal:0-7, Significant for Dysfunction:  >8)  Normal Cognitive Function Screening: Yes   Immunization & Health Maintenance Record Immunization History  Administered Date(s) Administered  . Fluad Quad(high Dose 65+) 04/16/2019  . Influenza Split 03/31/2012  . Influenza, High Dose Seasonal PF 04/11/2017, 04/08/2018  . Influenza,inj,Quad PF,6+ Mos 03/19/2013, 03/27/2015  . Influenza,inj,quad, With Preservative 04/21/2018  . Influenza-Unspecified 02/28/2014, 03/01/2016  . Pneumococcal Conjugate-13 07/08/2013  . Pneumococcal Polysaccharide-23 03/31/2005, 02/05/2019  . Tdap 04/01/2011    Health Maintenance  Topic Date Due  . OPHTHALMOLOGY EXAM  08/01/2017  . FOOT EXAM  02/04/2020  . HEMOGLOBIN A1C  02/20/2020  . URINE MICROALBUMIN  08/22/2020  . TETANUS/TDAP  03/31/2021  . INFLUENZA VACCINE  Completed  . PNA vac Low Risk Adult  Completed       Assessment  This is a routine wellness examination for Cristian Moore.  Health Maintenance: Due or Overdue Health Maintenance Due  Topic Date Due  . OPHTHALMOLOGY EXAM  08/01/2017    Cristian Moore does not need a referral for Community Assistance: Care Management:   no Social Work:    no Prescription Assistance:  no Nutrition/Diabetes Education:  no   Plan:  Personalized Goals Goals Addressed            This Visit's Progress   . DIET - INCREASE WATER INTAKE       Try to drink 6-8 glasses of water daily      Personalized Health Maintenance & Screening Recommendations  Shingles vaccine  Lung Cancer Screening Recommended: no (Low Dose CT Chest recommended if Age 9-80 years, 30 pack-year currently smoking OR have quit w/in past 15 years) Hepatitis C Screening recommended: no HIV Screening recommended: no  Advanced Directives: Written information was not prepared per patient's request.  Referrals & Orders No orders of the defined types were placed in this encounter.   Follow-up Plan . Follow-up with [name], MD as planned . Keep your  Scheduled Diabetic Eye Exam with the [facility] in June 2021 as discussed . Bring the immunization record by for [place] to document COVID-19 vaccines in your chart  . Get your Shingles vaccine at the [place] as discussed   I have personally reviewed and noted the following in the patient's chart:   . Medical and social history . Use of alcohol, tobacco or illicit drugs  . Current medications and supplements . Functional ability and status . Nutritional status . Physical activity . Advanced directives . List of other physicians . Hospitalizations, surgeries, and ER visits in previous 12 months . Vitals . Screenings to include cognitive, depression, and falls . Referrals and appointments  In addition, I have reviewed and discussed with Cristian Moore certain preventive protocols, quality metrics, and best practice recommendations. A written personalized care plan for preventive services as well as general preventive health recommendations is available and can be mailed to the patient at his  request.      Milas Hock, LPN  [id number]

## 2019-08-30 NOTE — Patient Instructions (Signed)

## 2019-08-31 DIAGNOSIS — R69 Illness, unspecified: Secondary | ICD-10-CM | POA: Diagnosis not present

## 2019-09-06 DIAGNOSIS — R69 Illness, unspecified: Secondary | ICD-10-CM | POA: Diagnosis not present

## 2019-09-15 DIAGNOSIS — R69 Illness, unspecified: Secondary | ICD-10-CM | POA: Diagnosis not present

## 2019-09-21 DIAGNOSIS — E1142 Type 2 diabetes mellitus with diabetic polyneuropathy: Secondary | ICD-10-CM | POA: Diagnosis not present

## 2019-09-21 DIAGNOSIS — I10 Essential (primary) hypertension: Secondary | ICD-10-CM | POA: Diagnosis not present

## 2019-09-21 DIAGNOSIS — G4700 Insomnia, unspecified: Secondary | ICD-10-CM | POA: Diagnosis not present

## 2019-09-21 DIAGNOSIS — I951 Orthostatic hypotension: Secondary | ICD-10-CM | POA: Diagnosis not present

## 2019-09-21 DIAGNOSIS — K219 Gastro-esophageal reflux disease without esophagitis: Secondary | ICD-10-CM | POA: Diagnosis not present

## 2019-09-21 DIAGNOSIS — E669 Obesity, unspecified: Secondary | ICD-10-CM | POA: Diagnosis not present

## 2019-09-21 DIAGNOSIS — J45909 Unspecified asthma, uncomplicated: Secondary | ICD-10-CM | POA: Diagnosis not present

## 2019-09-21 DIAGNOSIS — K2970 Gastritis, unspecified, without bleeding: Secondary | ICD-10-CM | POA: Diagnosis not present

## 2019-09-21 DIAGNOSIS — E785 Hyperlipidemia, unspecified: Secondary | ICD-10-CM | POA: Diagnosis not present

## 2019-09-21 DIAGNOSIS — G8929 Other chronic pain: Secondary | ICD-10-CM | POA: Diagnosis not present

## 2019-10-13 DIAGNOSIS — M25562 Pain in left knee: Secondary | ICD-10-CM | POA: Diagnosis not present

## 2019-10-13 DIAGNOSIS — M25561 Pain in right knee: Secondary | ICD-10-CM | POA: Diagnosis not present

## 2019-10-13 DIAGNOSIS — M170 Bilateral primary osteoarthritis of knee: Secondary | ICD-10-CM | POA: Diagnosis not present

## 2019-10-20 DIAGNOSIS — M170 Bilateral primary osteoarthritis of knee: Secondary | ICD-10-CM | POA: Diagnosis not present

## 2019-10-27 DIAGNOSIS — M170 Bilateral primary osteoarthritis of knee: Secondary | ICD-10-CM | POA: Diagnosis not present

## 2019-10-27 DIAGNOSIS — M79642 Pain in left hand: Secondary | ICD-10-CM | POA: Diagnosis not present

## 2019-10-27 DIAGNOSIS — W312XXA Contact with powered woodworking and forming machines, initial encounter: Secondary | ICD-10-CM | POA: Diagnosis not present

## 2019-11-02 DIAGNOSIS — M5416 Radiculopathy, lumbar region: Secondary | ICD-10-CM | POA: Diagnosis not present

## 2019-11-02 DIAGNOSIS — M545 Low back pain: Secondary | ICD-10-CM | POA: Diagnosis not present

## 2019-11-03 DIAGNOSIS — M79645 Pain in left finger(s): Secondary | ICD-10-CM | POA: Diagnosis not present

## 2019-11-03 DIAGNOSIS — Z4789 Encounter for other orthopedic aftercare: Secondary | ICD-10-CM | POA: Diagnosis not present

## 2019-11-03 DIAGNOSIS — M79642 Pain in left hand: Secondary | ICD-10-CM | POA: Diagnosis not present

## 2019-11-03 DIAGNOSIS — W312XXA Contact with powered woodworking and forming machines, initial encounter: Secondary | ICD-10-CM | POA: Diagnosis not present

## 2019-11-10 DIAGNOSIS — Z4789 Encounter for other orthopedic aftercare: Secondary | ICD-10-CM | POA: Diagnosis not present

## 2019-11-10 DIAGNOSIS — W312XXA Contact with powered woodworking and forming machines, initial encounter: Secondary | ICD-10-CM | POA: Diagnosis not present

## 2019-11-10 DIAGNOSIS — M25642 Stiffness of left hand, not elsewhere classified: Secondary | ICD-10-CM | POA: Diagnosis not present

## 2019-11-15 DIAGNOSIS — M545 Low back pain: Secondary | ICD-10-CM | POA: Diagnosis not present

## 2019-11-21 ENCOUNTER — Other Ambulatory Visit: Payer: Self-pay | Admitting: Family Medicine

## 2019-11-21 DIAGNOSIS — K219 Gastro-esophageal reflux disease without esophagitis: Secondary | ICD-10-CM

## 2019-11-22 ENCOUNTER — Ambulatory Visit: Payer: Medicare HMO | Admitting: Family Medicine

## 2019-11-25 DIAGNOSIS — M1712 Unilateral primary osteoarthritis, left knee: Secondary | ICD-10-CM | POA: Diagnosis not present

## 2019-11-25 DIAGNOSIS — M170 Bilateral primary osteoarthritis of knee: Secondary | ICD-10-CM | POA: Diagnosis not present

## 2019-11-25 DIAGNOSIS — M1711 Unilateral primary osteoarthritis, right knee: Secondary | ICD-10-CM | POA: Diagnosis not present

## 2019-12-01 DIAGNOSIS — M79642 Pain in left hand: Secondary | ICD-10-CM | POA: Diagnosis not present

## 2019-12-01 DIAGNOSIS — W312XXA Contact with powered woodworking and forming machines, initial encounter: Secondary | ICD-10-CM | POA: Diagnosis not present

## 2019-12-01 DIAGNOSIS — Z4789 Encounter for other orthopedic aftercare: Secondary | ICD-10-CM | POA: Diagnosis not present

## 2019-12-06 ENCOUNTER — Other Ambulatory Visit: Payer: Self-pay | Admitting: Family Medicine

## 2019-12-06 DIAGNOSIS — I10 Essential (primary) hypertension: Secondary | ICD-10-CM

## 2019-12-06 DIAGNOSIS — I2510 Atherosclerotic heart disease of native coronary artery without angina pectoris: Secondary | ICD-10-CM

## 2019-12-06 DIAGNOSIS — E118 Type 2 diabetes mellitus with unspecified complications: Secondary | ICD-10-CM

## 2019-12-11 ENCOUNTER — Other Ambulatory Visit: Payer: Self-pay | Admitting: Family Medicine

## 2019-12-13 ENCOUNTER — Telehealth: Payer: Self-pay | Admitting: Family Medicine

## 2019-12-13 DIAGNOSIS — E118 Type 2 diabetes mellitus with unspecified complications: Secondary | ICD-10-CM

## 2019-12-13 MED ORDER — METFORMIN HCL 500 MG PO TABS: 500.0000 mg | ORAL_TABLET | Freq: Every day | ORAL | 1 refills | Status: DC

## 2019-12-13 NOTE — Telephone Encounter (Signed)
Pt called and aware - re-sent

## 2019-12-13 NOTE — Telephone Encounter (Signed)
°  Prescription Request  12/13/2019  What is the name of the medication or equipment? metFORMIN (GLUCOPHAGE) 500 MG tablet    Have you contacted your pharmacy to request a refill? (if applicable) yes he was told by [facility] that [facility] had denied refill this morning, he was informed that on epic it says they confirmed receipt on 12/07/2019  Which pharmacy would you like this sent to? [facility]   Patient notified that their request is being sent to the clinical staff for review and that they should receive a response within 2 business days.

## 2019-12-18 ENCOUNTER — Other Ambulatory Visit: Payer: Self-pay | Admitting: Family Medicine

## 2019-12-23 DIAGNOSIS — M545 Low back pain, unspecified: Secondary | ICD-10-CM | POA: Insufficient documentation

## 2019-12-23 DIAGNOSIS — I10 Essential (primary) hypertension: Secondary | ICD-10-CM | POA: Diagnosis not present

## 2019-12-23 DIAGNOSIS — Z6835 Body mass index (BMI) 35.0-35.9, adult: Secondary | ICD-10-CM | POA: Diagnosis not present

## 2019-12-29 DIAGNOSIS — W312XXA Contact with powered woodworking and forming machines, initial encounter: Secondary | ICD-10-CM | POA: Diagnosis not present

## 2019-12-29 DIAGNOSIS — Z4789 Encounter for other orthopedic aftercare: Secondary | ICD-10-CM | POA: Diagnosis not present

## 2020-01-13 DIAGNOSIS — M545 Low back pain: Secondary | ICD-10-CM | POA: Diagnosis not present

## 2020-01-24 ENCOUNTER — Telehealth: Payer: Self-pay | Admitting: Family Medicine

## 2020-01-24 ENCOUNTER — Other Ambulatory Visit: Payer: Self-pay | Admitting: Family Medicine

## 2020-01-24 MED ORDER — ZOLPIDEM TARTRATE 10 MG PO TABS: 10.0000 mg | ORAL_TABLET | Freq: Every day | ORAL | 0 refills | Status: DC

## 2020-01-24 NOTE — Telephone Encounter (Signed)
I sent a 1 month supply to tide him over.  It should be at [address]

## 2020-01-24 NOTE — Telephone Encounter (Signed)
Has appt scheduled with you 02/24/20

## 2020-01-24 NOTE — Telephone Encounter (Signed)
Pt aware rx sent in to [facility].

## 2020-01-24 NOTE — Telephone Encounter (Signed)
  Prescription Request  01/24/2020  What is the name of the medication or equipment? zolpidem (AMBIEN) 10 MG tablet   Have you contacted your pharmacy to request a refill? (if applicable) YES  Which pharmacy would you like this sent to? [facility]    Patient notified that their request is being sent to the clinical staff for review and that they should receive a response within 2 business days.

## 2020-02-06 ENCOUNTER — Other Ambulatory Visit: Payer: Self-pay | Admitting: Family Medicine

## 2020-02-06 DIAGNOSIS — N400 Enlarged prostate without lower urinary tract symptoms: Secondary | ICD-10-CM

## 2020-02-06 DIAGNOSIS — K219 Gastro-esophageal reflux disease without esophagitis: Secondary | ICD-10-CM

## 2020-02-10 DIAGNOSIS — M47896 Other spondylosis, lumbar region: Secondary | ICD-10-CM | POA: Diagnosis not present

## 2020-02-10 DIAGNOSIS — M47816 Spondylosis without myelopathy or radiculopathy, lumbar region: Secondary | ICD-10-CM | POA: Diagnosis not present

## 2020-02-22 DIAGNOSIS — L570 Actinic keratosis: Secondary | ICD-10-CM | POA: Diagnosis not present

## 2020-02-22 DIAGNOSIS — L821 Other seborrheic keratosis: Secondary | ICD-10-CM | POA: Diagnosis not present

## 2020-02-22 DIAGNOSIS — D485 Neoplasm of uncertain behavior of skin: Secondary | ICD-10-CM | POA: Diagnosis not present

## 2020-02-22 DIAGNOSIS — L819 Disorder of pigmentation, unspecified: Secondary | ICD-10-CM | POA: Diagnosis not present

## 2020-02-22 DIAGNOSIS — D229 Melanocytic nevi, unspecified: Secondary | ICD-10-CM | POA: Diagnosis not present

## 2020-02-22 DIAGNOSIS — L814 Other melanin hyperpigmentation: Secondary | ICD-10-CM | POA: Diagnosis not present

## 2020-02-22 DIAGNOSIS — B079 Viral wart, unspecified: Secondary | ICD-10-CM | POA: Diagnosis not present

## 2020-02-24 ENCOUNTER — Other Ambulatory Visit: Payer: Self-pay

## 2020-02-24 ENCOUNTER — Encounter: Payer: Self-pay | Admitting: Family Medicine

## 2020-02-24 ENCOUNTER — Ambulatory Visit (INDEPENDENT_AMBULATORY_CARE_PROVIDER_SITE_OTHER): Payer: Medicare HMO | Admitting: Family Medicine

## 2020-02-24 VITALS — BP 102/60 | HR 60 | Temp 98.0°F | Ht 73.0 in | Wt 276.0 lb

## 2020-02-24 DIAGNOSIS — E782 Mixed hyperlipidemia: Secondary | ICD-10-CM | POA: Diagnosis not present

## 2020-02-24 DIAGNOSIS — J439 Emphysema, unspecified: Secondary | ICD-10-CM

## 2020-02-24 DIAGNOSIS — E039 Hypothyroidism, unspecified: Secondary | ICD-10-CM

## 2020-02-24 DIAGNOSIS — K219 Gastro-esophageal reflux disease without esophagitis: Secondary | ICD-10-CM

## 2020-02-24 DIAGNOSIS — F5101 Primary insomnia: Secondary | ICD-10-CM

## 2020-02-24 DIAGNOSIS — I700 Atherosclerosis of aorta: Secondary | ICD-10-CM | POA: Diagnosis not present

## 2020-02-24 DIAGNOSIS — N400 Enlarged prostate without lower urinary tract symptoms: Secondary | ICD-10-CM

## 2020-02-24 DIAGNOSIS — E559 Vitamin D deficiency, unspecified: Secondary | ICD-10-CM | POA: Diagnosis not present

## 2020-02-24 DIAGNOSIS — E118 Type 2 diabetes mellitus with unspecified complications: Secondary | ICD-10-CM

## 2020-02-24 DIAGNOSIS — I10 Essential (primary) hypertension: Secondary | ICD-10-CM | POA: Diagnosis not present

## 2020-02-24 DIAGNOSIS — M1610 Unilateral primary osteoarthritis, unspecified hip: Secondary | ICD-10-CM

## 2020-02-24 DIAGNOSIS — R69 Illness, unspecified: Secondary | ICD-10-CM | POA: Diagnosis not present

## 2020-02-24 LAB — BAYER DCA HB A1C WAIVED: HB A1C (BAYER DCA - WAIVED): 6.1 % (ref <7.0)

## 2020-02-24 NOTE — Patient Instructions (Signed)
Decrease gabapentin to 1 in the morning and 2 tabs in the evening for 1 week.  Then chang to one morning and 1 in the evening for 1 week.  During the third week take only 1 tablet in the evening.  After the third week you can discontinue the medication.

## 2020-02-24 NOTE — Progress Notes (Signed)
Subjective:  Patient ID: Cristian Moore,  male    DOB: [birth year]  Age: 79 y.o.    CC: Follow-up (6 month)   HPI Cristian Moore presents for  follow-up of hypertension. Patient has no history of headache chest pain or shortness of breath or recent cough. Patient also denies symptoms of TIA such as numbness weakness lateralizing. Patient denies side effects from medication. States taking it regularly.  Patient also  in for follow-up of elevated cholesterol. Doing well without complaints on current medication. Denies side effects  including myalgia and arthralgia and nausea. Also in today for liver function testing. Currently no chest pain, shortness of breath or other cardiovascular related symptoms noted.  Follow-up of diabetes. Patient does check blood sugar at home. Readings run between 100 and 114 Patient denies symptoms such as excessive hunger or urinary frequency, excessive hunger, nausea No significant hypoglycemic spells noted. Medications reviewed. Pt reports taking them regularly. Pt. denies complication/adverse reaction today.  Patient recently had an eye exam at the [place].  History Cristian Moore has a past medical history of Arthritis, Asthma, Benign prostatic hypertrophy, Carotid stenosis, Chest pain, Chronic Dyspnea on exertion, Diabetes mellitus without complication ([place]) (7253), Difficulty urinating, Diverticulitis, Diverticulosis, Exogenous obesity, GERD (gastroesophageal reflux disease), H/O blepharoplasty (07/27/2013), H/O hiatal hernia, Hernia, Hyperlipidemia, Laceration ( 01/11/2009 ), Musculoskeletal pain, Obstructive sleep apnea, Rectal bleeding, SUPRAVENTRICULAR TACHYCARDIA (08/01/2009), Thyroiditis, Ulcer (2008), and Walking pneumonia.   He has a past surgical history that includes Inguinal hernia repair; Kidney stone surgery; Tonsillectomy; Colonoscopy; Rotator cuff repair; Knee surgery; Elbow bursa surgery; Laparoscopic sigmoid colectomy (07/23/2012); Umbilical hernia  repair (07/23/2012); Proctoscopy (07/23/2012); Eyelid Surgery (Bilateral, 05/2013); left heart catheterization with coronary angiogram (N/A, 02/22/2013); and Total hip arthroplasty (Right, 05/27/2018).   His family history includes Heart disease in his mother; Lung cancer in his maternal uncle; Stroke in his mother.He reports that he quit smoking about 20 years ago. His smoking use included cigars. He has never used smokeless tobacco. He reports that he does not drink alcohol and does not use drugs.  Current Outpatient Medications on File Prior to Visit  Medication Sig Dispense Refill  . acetaminophen (TYLENOL) 500 MG tablet Take 1,000 mg by mouth every 6 (six) hours as needed for moderate pain.    Marland Kitchen albuterol (VENTOLIN HFA) 108 (90 Base) MCG/ACT inhaler Inhale 2 puffs into the lungs every 6 (six) hours as needed for wheezing or shortness of breath. 18 g 3  . Cholecalciferol (VITAMIN D) 50 MCG (2000 UT) tablet Take 2,000 Units by mouth daily.     Marland Kitchen escitalopram (LEXAPRO) 20 MG tablet Take 1/2 (one-half) tablet by mouth once daily 45 tablet 3  . fluticasone (FLONASE) 50 MCG/ACT nasal spray USE 2 SPRAYS IN EACH       NOSTRIL DAILY 48 g 1  . gabapentin (NEURONTIN) 300 MG capsule Take 3 capsules (900 mg total) by mouth 2 (two) times daily. 540 capsule 1  . hydroxypropyl methylcellulose / hypromellose (ISOPTO TEARS / GONIOVISC) 2.5 % ophthalmic solution Place 1 drop into both eyes daily as needed for dry eyes.    Cristian Moore Oil 350 MG CAPS Take 350 mg by mouth 2 (two) times daily.     . metFORMIN (GLUCOPHAGE) 500 MG tablet Take 1 tablet (500 mg total) by mouth daily with breakfast. 90 tablet 1  . metoprolol tartrate (LOPRESSOR) 50 MG tablet TAKE 1 TABLET TWICE A DAY 180 tablet 0  . montelukast (SINGULAIR) 10 MG tablet Take 1 tablet (10  mg total) by mouth at bedtime. 90 tablet 3  . Multiple Vitamins-Minerals (CENTRUM SILVER PO) Take 1 tablet by mouth daily.    Glory Rosebush DELICA LANCETS 16X MISC Check BS BID  and PRN. DX. E11.8 100 each 11  . ONETOUCH VERIO test strip USE TO CHECK BLOOD SUGAR TWICE DAILY AND AS NEEDED 100 each 11  . PRESCRIPTION MEDICATION Inhale 1 application into the lungs at bedtime. "CPAP" MACHINE    . simvastatin (ZOCOR) 20 MG tablet Take 20 mg by mouth daily.    . sodium chloride (OCEAN) 0.65 % nasal spray Place 1 spray into the nose daily as needed for congestion.     Marland Kitchen Spacer/Aero-Holding Chambers (AEROCHAMBER MV) inhaler Use as instructed 1 each 0  . sucralfate (CARAFATE) 1 g tablet TAKE 1 TABLET DAILY AS     NEEDED FOR STOMACH COATING 90 tablet 0  . tamsulosin (FLOMAX) 0.4 MG CAPS capsule TAKE 2 CAPSULES DAILY AFTERSUPPER 180 capsule 0  . zolpidem (AMBIEN) 10 MG tablet Take 1 tablet (10 mg total) by mouth at bedtime. 30 tablet 0  . nitroGLYCERIN (NITROSTAT) 0.4 MG SL tablet DISSOLVE ONE TABLET UNDER THE TONGUE EVERY 5 MINUTES AS NEEDED FOR CHEST PAIN.  DO NOT EXCEED A TOTAL OF 3 DOSES IN 15 MINUTES (Patient not taking: Reported on 02/24/2020) 25 tablet 11   No current facility-administered medications on file prior to visit.    ROS Review of Systems  Constitutional: Negative.   HENT: Negative.   Eyes: Negative for visual disturbance.  Respiratory: Negative for cough and shortness of breath.   Cardiovascular: Negative for chest pain and leg swelling.  Gastrointestinal: Negative for abdominal pain, diarrhea, nausea and vomiting.  Genitourinary: Positive for frequency (With nocturia x1-2 per night). Negative for difficulty urinating.  Musculoskeletal: Positive for back pain (Getting ESI per Dr. Nelva Bush who has referred him to neurosurgerypoor) and gait problem (Poor balance.  He says he feels clumsy.  He is concerned that it is the gabapentin and he wants to taper off of that.). Negative for arthralgias and myalgias.  Skin: Negative for rash.  Neurological: Negative for headaches.  Psychiatric/Behavioral: Negative for sleep disturbance.    Objective:  BP 102/60   Pulse  60   Temp 98 F (36.7 C) (Temporal)   Ht 6' 1" (1.854 m)   Wt 276 lb (125.2 kg)   BMI 36.41 kg/m   BP Readings from Last 3 Encounters:  02/24/20 102/60  08/23/19 107/62  05/20/19 127/82    Wt Readings from Last 3 Encounters:  02/24/20 276 lb (125.2 kg)  08/23/19 275 lb 6.4 oz (124.9 kg)  05/20/19 274 lb (124.3 kg)     Physical Exam  Diabetic Foot Exam - Simple   No data filed        Assessment & Plan:   Chike was seen today for follow-up.  Diagnoses and all orders for this visit:  Type 2 diabetes mellitus with complication, without long-term current use of insulin (HCC) -     CBC with Differential/Platelet -     CMP14+EGFR -     Bayer DCA Hb A1c Waived  Essential hypertension -     CBC with Differential/Platelet -     CMP14+EGFR  DM (diabetes mellitus), type 2 with complications (HCC) -     CBC with Differential/Platelet -     CMP14+EGFR -     Lipid panel -     Bayer DCA Hb A1c Waived  Hypothyroidism, unspecified type -  CBC with Differential/Platelet -     CMP14+EGFR -     Thyroid Panel With TSH  Vitamin D deficiency -     CBC with Differential/Platelet -     CMP14+EGFR -     VITAMIN D 25 Hydroxy (Vit-D Deficiency, Fractures)  Mixed hyperlipidemia -     CBC with Differential/Platelet -     CMP14+EGFR -     Lipid panel  Benign prostatic hyperplasia, unspecified whether lower urinary tract symptoms present -     CBC with Differential/Platelet -     CMP14+EGFR -     PSA, total and free  Gastroesophageal reflux disease without esophagitis  Primary insomnia  Gastroesophageal reflux disease, unspecified whether esophagitis present  Pulmonary emphysema, unspecified emphysema type (HCC)  Aortic atherosclerosis (HCC)  Primary osteoarthritis of hip, unspecified laterality   I am having Cristian Moore maintain his Vitamin D, Multiple Vitamins-Minerals (CENTRUM SILVER PO), sodium chloride, OneTouch Delica Lancets 16X, PRESCRIPTION  MEDICATION, nitroGLYCERIN, AeroChamber MV, OneTouch Verio, hydroxypropyl methylcellulose / hypromellose, acetaminophen, Krill Oil, albuterol, montelukast, simvastatin, escitalopram, gabapentin, metoprolol tartrate, fluticasone, metFORMIN, zolpidem, tamsulosin, and sucralfate.  No orders of the defined types were placed in this encounter.    Follow-up: Return in about 6 months (around 08/26/2020).  [name], M.D.

## 2020-02-25 LAB — THYROID PANEL WITH TSH
Free Thyroxine Index: 1.1 — ABNORMAL LOW (ref 1.2–4.9)
T3 Uptake Ratio: 25 % (ref 24–39)
T4, Total: 4.4 ug/dL — ABNORMAL LOW (ref 4.5–12.0)
TSH: 0.525 u[IU]/mL (ref 0.450–4.500)

## 2020-02-25 LAB — CMP14+EGFR
ALT: 13 IU/L (ref 0–44)
AST: 15 IU/L (ref 0–40)
Albumin/Globulin Ratio: 2 (ref 1.2–2.2)
Albumin: 4.1 g/dL (ref 3.7–4.7)
Alkaline Phosphatase: 40 IU/L — ABNORMAL LOW (ref 48–121)
BUN/Creatinine Ratio: 12 (ref 10–24)
BUN: 14 mg/dL (ref 8–27)
Bilirubin Total: 0.3 mg/dL (ref 0.0–1.2)
CO2: 26 mmol/L (ref 20–29)
Calcium: 9.8 mg/dL (ref 8.6–10.2)
Chloride: 102 mmol/L (ref 96–106)
Creatinine, Ser: 1.16 mg/dL (ref 0.76–1.27)
GFR calc Af Amer: 68 mL/min/{1.73_m2} (ref >59)
GFR calc non Af Amer: 59 mL/min/{1.73_m2} — ABNORMAL LOW (ref >59)
Globulin, Total: 2.1 g/dL (ref 1.5–4.5)
Glucose: 135 mg/dL — ABNORMAL HIGH (ref 65–99)
Potassium: 4.2 mmol/L (ref 3.5–5.2)
Sodium: 142 mmol/L (ref 134–144)
Total Protein: 6.2 g/dL (ref 6.0–8.5)

## 2020-02-25 LAB — CBC WITH DIFFERENTIAL/PLATELET
Basophils Absolute: 0.1 10*3/uL (ref 0.0–0.2)
Basos: 1 %
EOS (ABSOLUTE): 0.3 10*3/uL (ref 0.0–0.4)
Eos: 3 %
Hematocrit: 41.1 % (ref 37.5–51.0)
Hemoglobin: 13.9 g/dL (ref 13.0–17.7)
Immature Grans (Abs): 0.1 10*3/uL (ref 0.0–0.1)
Immature Granulocytes: 1 %
Lymphocytes Absolute: 2.4 10*3/uL (ref 0.7–3.1)
Lymphs: 30 %
MCH: 33.3 pg — ABNORMAL HIGH (ref 26.6–33.0)
MCHC: 33.8 g/dL (ref 31.5–35.7)
MCV: 98 fL — ABNORMAL HIGH (ref 79–97)
Monocytes Absolute: 0.8 10*3/uL (ref 0.1–0.9)
Monocytes: 10 %
Neutrophils Absolute: 4.3 10*3/uL (ref 1.4–7.0)
Neutrophils: 55 %
Platelets: 238 10*3/uL (ref 150–450)
RBC: 4.18 x10E6/uL (ref 4.14–5.80)
RDW: 12.4 % (ref 11.6–15.4)
WBC: 7.8 10*3/uL (ref 3.4–10.8)

## 2020-02-25 LAB — LIPID PANEL
Chol/HDL Ratio: 3.8 ratio (ref 0.0–5.0)
Cholesterol, Total: 162 mg/dL (ref 100–199)
HDL: 43 mg/dL
LDL Chol Calc (NIH): 84 mg/dL (ref 0–99)
Triglycerides: 210 mg/dL — ABNORMAL HIGH (ref 0–149)
VLDL Cholesterol Cal: 35 mg/dL (ref 5–40)

## 2020-02-25 LAB — PSA, TOTAL AND FREE
PSA, Free Pct: 40 %
PSA, Free: 0.32 ng/mL
Prostate Specific Ag, Serum: 0.8 ng/mL (ref 0.0–4.0)

## 2020-02-25 LAB — VITAMIN D 25 HYDROXY (VIT D DEFICIENCY, FRACTURES): Vit D, 25-Hydroxy: 42 ng/mL (ref 30.0–100.0)

## 2020-02-27 NOTE — Progress Notes (Signed)
Hello Cristian Moore,  Your lab result is normal and/or stable.Some minor variations that are not significant are commonly marked abnormal, but do not represent any medical problem for you.  Best regards, [name], M.D.

## 2020-02-28 ENCOUNTER — Encounter: Payer: Self-pay | Admitting: Family Medicine

## 2020-02-29 ENCOUNTER — Telehealth: Payer: Self-pay | Admitting: Family Medicine

## 2020-02-29 ENCOUNTER — Other Ambulatory Visit: Payer: Self-pay | Admitting: Family Medicine

## 2020-02-29 DIAGNOSIS — M5136 Other intervertebral disc degeneration, lumbar region: Secondary | ICD-10-CM | POA: Diagnosis not present

## 2020-02-29 DIAGNOSIS — M545 Low back pain: Secondary | ICD-10-CM | POA: Diagnosis not present

## 2020-02-29 MED ORDER — ZOLPIDEM TARTRATE 10 MG PO TABS
10.0000 mg | ORAL_TABLET | Freq: Every day | ORAL | 1 refills | Status: DC
Start: 2020-02-29 — End: 2020-03-07

## 2020-02-29 NOTE — Telephone Encounter (Signed)
ADDENDUM to telephone message: Pt requesting 90 day supply of Zolpidem be sent to [facility] so that they will cover it.

## 2020-02-29 NOTE — Telephone Encounter (Signed)
Done. Thanks, WS 

## 2020-02-29 NOTE — Telephone Encounter (Signed)
  Prescription Request  02/29/2020  What is the name of the medication or equipment? Zolpidem  Have you contacted your pharmacy to request a refill? (if applicable) Yes  Which pharmacy would you like this sent to? [facility]  Pt had his last visit with Dr Livia Snellen on 02/24/20 and refills were supposed to be sent in but pt says he is being told that no refills were sent in for this med.  Patient notified that their request is being sent to the clinical staff for review and that they should receive a response within 2 business days.

## 2020-02-29 NOTE — Telephone Encounter (Signed)
90 days please

## 2020-03-02 DIAGNOSIS — R69 Illness, unspecified: Secondary | ICD-10-CM | POA: Diagnosis not present

## 2020-03-07 ENCOUNTER — Telehealth: Payer: Self-pay | Admitting: Family Medicine

## 2020-03-07 ENCOUNTER — Other Ambulatory Visit: Payer: Self-pay | Admitting: Family Medicine

## 2020-03-07 MED ORDER — ZOLPIDEM TARTRATE 10 MG PO TABS
10.0000 mg | ORAL_TABLET | Freq: Every day | ORAL | 1 refills | Status: DC
Start: 2020-03-07 — End: 2020-08-28

## 2020-03-07 NOTE — Telephone Encounter (Signed)
Please let the patient know that I sent their prescription to their pharmacy. Thanks, WS 

## 2020-03-07 NOTE — Telephone Encounter (Signed)
Patient aware and verbalized understanding. °

## 2020-04-10 ENCOUNTER — Encounter: Payer: Self-pay | Admitting: Gastroenterology

## 2020-05-06 ENCOUNTER — Other Ambulatory Visit: Payer: Self-pay | Admitting: Family Medicine

## 2020-05-06 DIAGNOSIS — K219 Gastro-esophageal reflux disease without esophagitis: Secondary | ICD-10-CM

## 2020-05-06 DIAGNOSIS — N400 Enlarged prostate without lower urinary tract symptoms: Secondary | ICD-10-CM

## 2020-05-10 DIAGNOSIS — Z23 Encounter for immunization: Secondary | ICD-10-CM | POA: Diagnosis not present

## 2020-05-23 DIAGNOSIS — M5136 Other intervertebral disc degeneration, lumbar region: Secondary | ICD-10-CM | POA: Diagnosis not present

## 2020-05-27 ENCOUNTER — Other Ambulatory Visit: Payer: Self-pay | Admitting: Family Medicine

## 2020-05-28 DIAGNOSIS — R69 Illness, unspecified: Secondary | ICD-10-CM | POA: Diagnosis not present

## 2020-05-30 ENCOUNTER — Other Ambulatory Visit: Payer: Self-pay

## 2020-05-30 ENCOUNTER — Ambulatory Visit (AMBULATORY_SURGERY_CENTER): Payer: Self-pay | Admitting: *Deleted

## 2020-05-30 VITALS — Ht 73.0 in | Wt 268.0 lb

## 2020-05-30 DIAGNOSIS — Z1211 Encounter for screening for malignant neoplasm of colon: Secondary | ICD-10-CM

## 2020-05-30 MED ORDER — MOVIPREP 100 G PO SOLR: 1.0000 | Freq: Once | ORAL | 0 refills | Status: AC

## 2020-05-30 NOTE — Progress Notes (Signed)
FULLY VAX'D AND BOOSTER  No egg or soy allergy known to patient  No issues with past sedation with any surgeries or procedures no intubation problems in the past  No FH of Malignant Hyperthermia No diet pills per patient No home 02 use per patient  No blood thinners per patient   Pt states issues  with constipation - alternates diarrhea and constipation- has this issue since colon surgery 2014- he doesn't usually take meds to help him go- he will have weeks that has good soft BM's then eill have a bout of constipation , then will have diarrhea for  A week after the constipation   No A fib or A flutter  EMMI video to pt or via [place] 19 guidelines implemented in [place] today with Pt and RN     Due to the COVID-19 pandemic we are asking patients to follow these guidelines. Please only bring one care partner. Please be aware that your care partner may wait in the car in the parking lot or if they feel like they will be too hot to wait in the car, they may wait in the lobby on the 4th floor. All care partners are required to wear a mask the entire time (we do not have any that we can provide them), they need to practice social distancing, and we will do a Covid check for all patient's and care partners when you arrive. Also we will check their temperature and your temperature. If the care partner waits in their car they need to stay in the parking lot the entire time and we will call them on their cell phone when the patient is ready for discharge so they can bring the car to the front of the building. Also all patient's will need to wear a mask into building.

## 2020-06-12 ENCOUNTER — Telehealth: Payer: Self-pay | Admitting: Internal Medicine

## 2020-06-12 DIAGNOSIS — Z1211 Encounter for screening for malignant neoplasm of colon: Secondary | ICD-10-CM

## 2020-06-12 MED ORDER — PEG 3350-KCL-NA BICARB-NACL 420 G PO SOLR: 4000.0000 mL | Freq: Once | ORAL | 0 refills | Status: DC

## 2020-06-12 MED ORDER — GOLYTELY 236 G PO SOLR: 4000.0000 mL | Freq: Once | ORAL | 0 refills | Status: AC

## 2020-06-12 MED ORDER — PEG-KCL-NACL-NASULF-NA ASC-C 100 G PO SOLR: 1.0000 | Freq: Once | ORAL | 0 refills | Status: DC

## 2020-06-12 NOTE — Telephone Encounter (Signed)
Pt is requesting for his MOVIPREP be called in to his pharmacy.  Jud

## 2020-06-12 NOTE — Telephone Encounter (Signed)
Patient states his Moviprep has not been delivered by the [place]. He request this be sent to [facility] colonoscopy is tomorrow. Moviprep RX sent to [facility]. Pt will call us back if needed.

## 2020-06-12 NOTE — Telephone Encounter (Signed)
[facility] states they do not have the [place] in stock, caller wants to know if the provider would like to switch to TriLite and the Go Lightly, caller would like a call back from a nurse.

## 2020-06-12 NOTE — Addendum Note (Signed)
Addended by: Levonne Spiller on: 06/12/2020 10:46 AM   Modules accepted: Orders

## 2020-06-12 NOTE — Telephone Encounter (Signed)
Patient's wife called several local pharmacy and none has the Moviprep in stock. Patient wants different prep. Golytely sent to [facility] and new prep instructions sent to pt's Mychart-patient and wife aware. I explained that most pharmacy's keep this prep in stock. Patient will call back if needed.

## 2020-06-13 ENCOUNTER — Ambulatory Visit (AMBULATORY_SURGERY_CENTER): Payer: No Typology Code available for payment source | Admitting: Gastroenterology

## 2020-06-13 ENCOUNTER — Other Ambulatory Visit: Payer: Self-pay

## 2020-06-13 ENCOUNTER — Encounter: Payer: Self-pay | Admitting: Gastroenterology

## 2020-06-13 VITALS — BP 127/76 | HR 56 | Temp 96.8°F | Resp 20 | Ht 73.0 in | Wt 268.0 lb

## 2020-06-13 DIAGNOSIS — Z1211 Encounter for screening for malignant neoplasm of colon: Secondary | ICD-10-CM

## 2020-06-13 DIAGNOSIS — K648 Other hemorrhoids: Secondary | ICD-10-CM

## 2020-06-13 DIAGNOSIS — D123 Benign neoplasm of transverse colon: Secondary | ICD-10-CM

## 2020-06-13 DIAGNOSIS — K5730 Diverticulosis of large intestine without perforation or abscess without bleeding: Secondary | ICD-10-CM

## 2020-06-13 DIAGNOSIS — R194 Change in bowel habit: Secondary | ICD-10-CM | POA: Diagnosis not present

## 2020-06-13 MED ORDER — SODIUM CHLORIDE 0.9 % IV SOLN: 500.0000 mL | Freq: Once | INTRAVENOUS | Status: DC

## 2020-06-13 NOTE — Patient Instructions (Signed)
Discharge instructions given. Handouts on polyps and diverticulosis. Resume previous medications. Make appointment to see Dr. Carlean Purl in clinic regarding altered bowel habits. Use Miralax one half capful /packet by mouth daily. YOU HAD AN ENDOSCOPIC PROCEDURE TODAY AT [facility]:   Refer to the procedure report that was given to you for any specific questions about what was found during the examination.  If the procedure report does not answer your questions, please call your gastroenterologist to clarify.  If you requested that your care partner not be given the details of your procedure findings, then the procedure report has been included in a sealed envelope for you to review at your convenience later.  YOU SHOULD EXPECT: Some feelings of bloating in the abdomen. Passage of more gas than usual.  Walking can help get rid of the air that was put into your GI tract during the procedure and reduce the bloating. If you had a lower endoscopy (such as a colonoscopy or flexible sigmoidoscopy) you may notice spotting of blood in your stool or on the toilet paper. If you underwent a bowel prep for your procedure, you may not have a normal bowel movement for a few days.  Please Note:  You might notice some irritation and congestion in your nose or some drainage.  This is from the oxygen used during your procedure.  There is no need for concern and it should clear up in a day or so.  SYMPTOMS TO REPORT IMMEDIATELY:   Following lower endoscopy (colonoscopy or flexible sigmoidoscopy):  Excessive amounts of blood in the stool  Significant tenderness or worsening of abdominal pains  Swelling of the abdomen that is new, acute  Fever of 100F or higher   For urgent or emergent issues, a gastroenterologist can be reached at any hour by calling [phone]. Do not use MyChart messaging for urgent concerns.    DIET:  We do recommend a small meal at first, but then you may proceed to your  regular diet.  Drink plenty of fluids but you should avoid alcoholic beverages for 24 hours.  ACTIVITY:  You should plan to take it easy for the rest of today and you should NOT DRIVE or use heavy machinery until tomorrow (because of the sedation medicines used during the test).    FOLLOW UP: Our staff will call the number listed on your records 48-72 hours following your procedure to check on you and address any questions or concerns that you may have regarding the information given to you following your procedure. If we do not reach you, we will leave a message.  We will attempt to reach you two times.  During this call, we will ask if you have developed any symptoms of COVID 19. If you develop any symptoms (ie: fever, flu-like symptoms, shortness of breath, cough etc.) before then, please call [phone].  If you test positive for Covid 19 in the 2 weeks post procedure, please call and report this information to [place].    If any biopsies were taken you will be contacted by phone or by letter within the next 1-3 weeks.  Please call us at [phone] if you have not heard about the biopsies in 3 weeks.    SIGNATURES/CONFIDENTIALITY: You and/or your care partner have signed paperwork which will be entered into your electronic medical record.  These signatures attest to the fact that that the information above on your After Visit Summary has been reviewed and is understood.  Full  responsibility of the confidentiality of this discharge information lies with you and/or your care-partner.

## 2020-06-13 NOTE — Op Note (Signed)
[facility] Patient Name: Cristian Moore Procedure Date: 06/13/2020 10:59 AM MRN: [id number] Endoscopist: Mallie Mussel L. Loletha Carrow , MD Age: 79 Referring MD:  Date of Birth: 12/06/1940 Gender: Male Account #: [id number] Procedure:                Colonoscopy Indications:              Screening for colorectal malignant neoplasm, ,                            Incidental change in bowel habits noted -                            alternating constipation and diarrhea                           (no polyps on last colonoscopy 2011 with Dr.                            Carlean Purl. Adenomatous polyps on exams prior to that) Medicines:                Monitored Anesthesia Care Procedure:                Pre-Anesthesia Assessment:                           - Prior to the procedure, a History and Physical                            was performed, and patient medications and                            allergies were reviewed. The patient's tolerance of                            previous anesthesia was also reviewed. The risks                            and benefits of the procedure and the sedation                            options and risks were discussed with the patient.                            All questions were answered, and informed consent                            was obtained. Prior Anticoagulants: The patient has                            taken no previous anticoagulant or antiplatelet                            agents. ASA Grade Assessment: III - A patient with  severe systemic disease. After reviewing the risks                            and benefits, the patient was deemed in                            satisfactory condition to undergo the procedure.                           After obtaining informed consent, the colonoscope                            was passed under direct vision. Throughout the                            procedure, the patient's blood  pressure, pulse, and                            oxygen saturations were monitored continuously. The                            Colonoscope was introduced through the anus and                            advanced to the the cecum, identified by                            appendiceal orifice and ileocecal valve. The                            colonoscopy was performed without difficulty. The                            patient tolerated the procedure well. The quality                            of the bowel preparation was good. The ileocecal                            valve, appendiceal orifice, and rectum were                            photographed. Scope In: 11:16:38 AM Scope Out: 11:28:28 AM Scope Withdrawal Time: 0 hours 9 minutes 40 seconds  Total Procedure Duration: 0 hours 11 minutes 50 seconds  Findings:                 The perianal and digital rectal examinations were                            normal.                           There was evidence of a prior end-to-end  colo-rectal anastomosis in the rectum. This was                            patent and was characterized by healthy appearing                            mucosa. The anastomosis was traversed.                           A few small-mouthed diverticula were found in the                            descending colon.                           A diminutive polyp was found in the transverse                            colon. The polyp was sessile. The polyp was removed                            with a cold snare. Resection and retrieval were                            complete.                           Internal hemorrhoids were found.                           The exam was otherwise without abnormality on                            direct and retroflexion views.                           Alter bowel habit most likely due to altered                            motility from prior rectosigmoid  resection. Complications:            No immediate complications. Estimated Blood Loss:     Estimated blood loss was minimal. Impression:               - Patent end-to-end colo-rectal anastomosis,                            characterized by healthy appearing mucosa.                           - Diverticulosis in the descending colon.                           - One diminutive polyp in the transverse colon,  removed with a cold snare. Resected and retrieved.                           - Internal hemorrhoids.                           - The examination was otherwise normal on direct                            and retroflexion views. Recommendation:           - Patient has a contact number available for                            emergencies. The signs and symptoms of potential                            delayed complications were discussed with the                            patient. Return to normal activities tomorrow.                            Written discharge instructions were provided to the                            patient.                           - Resume previous diet.                           - Continue present medications.                           - Await pathology results.                           - No repeat routine screening/surveillance                            colonoscopy recommended due to age and current                            guidelines.                           - Use Miralax one half capful/packet PO daily.                           - Make appointment to see Dr. Carlean Purl in clinic                            regarding altered bowel habits. Eddy Termine L. Loletha Carrow, MD 06/13/2020 11:38:56 AM This report has been signed electronically.

## 2020-06-13 NOTE — Progress Notes (Signed)
PT taken to PACU. Monitors in place. VSS. Report given to RN. 

## 2020-06-13 NOTE — Progress Notes (Signed)
VS by [place].  previsit done by EM.  Pt states no changes to health hx since visit

## 2020-06-13 NOTE — Progress Notes (Signed)
Called to room to assist during endoscopic procedure.  Patient ID and intended procedure confirmed with present staff. Received instructions for my participation in the procedure from the performing physician.  

## 2020-06-15 ENCOUNTER — Telehealth: Payer: Self-pay

## 2020-06-15 NOTE — Telephone Encounter (Signed)
Attempted to reach pt. With follow-up call following endoscopic procedure 06/13/2020. LM on pt. Voice mail.  Will try to reach pt. Again later today.

## 2020-06-15 NOTE — Telephone Encounter (Signed)
LVM

## 2020-06-19 ENCOUNTER — Encounter: Payer: Self-pay | Admitting: Gastroenterology

## 2020-06-28 ENCOUNTER — Other Ambulatory Visit: Payer: Non-veteran care

## 2020-06-28 DIAGNOSIS — Z20822 Contact with and (suspected) exposure to covid-19: Secondary | ICD-10-CM

## 2020-06-29 LAB — NOVEL CORONAVIRUS, NAA: SARS-CoV-2, NAA: NOT DETECTED

## 2020-06-29 LAB — SARS-COV-2, NAA 2 DAY TAT

## 2020-07-10 DIAGNOSIS — M170 Bilateral primary osteoarthritis of knee: Secondary | ICD-10-CM | POA: Diagnosis not present

## 2020-07-21 ENCOUNTER — Ambulatory Visit (INDEPENDENT_AMBULATORY_CARE_PROVIDER_SITE_OTHER): Payer: Medicare HMO | Admitting: Nurse Practitioner

## 2020-07-21 ENCOUNTER — Encounter: Payer: Self-pay | Admitting: Nurse Practitioner

## 2020-07-21 DIAGNOSIS — J0110 Acute frontal sinusitis, unspecified: Secondary | ICD-10-CM | POA: Diagnosis not present

## 2020-07-21 MED ORDER — DM-GUAIFENESIN ER 30-600 MG PO TB12: 1.0000 | ORAL_TABLET | Freq: Two times a day (BID) | ORAL | 0 refills | Status: DC

## 2020-07-21 MED ORDER — AZITHROMYCIN 250 MG PO TABS: ORAL_TABLET | ORAL | 0 refills | Status: DC

## 2020-07-21 NOTE — Assessment & Plan Note (Signed)
Symptoms not well controlled patient is reporting new headache, sinus drainage, sore throat, and head pressure.  No loss of appetite, no cough, and no fever. Started patient on Z-Pak, Tylenol for headache, COVID-19 swab ordered.  Rx sent to pharmacy  Follow-up with worsening or unresolved symptoms.

## 2020-07-21 NOTE — Patient Instructions (Signed)

## 2020-07-21 NOTE — Progress Notes (Signed)
   Virtual Visit via telephone Note Due to COVID-19 pandemic this visit was conducted virtually. This visit type was conducted due to national recommendations for restrictions regarding the COVID-19 Pandemic (e.g. social distancing, sheltering in place) in an effort to limit this patient's exposure and mitigate transmission in our community. All issues noted in this document were discussed and addressed.  A physical exam was not performed with this format.  I connected with Cristian Moore on 07/21/20 at 11:20 am  by telephone and verified that I am speaking with the correct person using two identifiers. Cristian Moore is currently located at home  during visit. The provider, Ivy Lynn, NP is located in their office at time of visit.  I discussed the limitations, risks, security and privacy concerns of performing an evaluation and management service by telephone and the availability of in person appointments. I also discussed with the patient that there may be a patient responsible charge related to this service. The patient expressed understanding and agreed to proceed.   History and Present Illness:  Sinusitis This is a new problem. The problem is unchanged. There has been no fever. The pain is mild. Associated symptoms include headaches and a sore throat. Pertinent negatives include no chills, congestion, coughing, ear pain, hoarse voice or shortness of breath. Past treatments include nothing.      Review of Systems  Constitutional: Negative for chills and fever.  HENT: Positive for sore throat. Negative for congestion, ear pain and hoarse voice.   Eyes: Negative.   Respiratory: Negative for cough and shortness of breath.   Cardiovascular: Negative.   Gastrointestinal: Negative.  Negative for abdominal pain and nausea.  Genitourinary: Negative.   Musculoskeletal: Negative.   Skin: Negative.   Neurological: Positive for headaches.  All other systems reviewed and are  negative.    Observations/Objective:   Tele- visit  Assessment and Plan: Subacute frontal sinusitis Symptoms not well controlled patient is reporting new headache, sinus drainage, sore throat, and head pressure.  No loss of appetite, no cough, and no fever. Started patient on Z-Pak, Tylenol for headache, COVID-19 swab ordered.  Rx sent to pharmacy  Follow-up with worsening or unresolved symptoms.    Follow Up Instructions: Follow up with unresolved symptoms     I discussed the assessment and treatment plan with the patient. The patient was provided an opportunity to ask questions and all were answered. The patient agreed with the plan and demonstrated an understanding of the instructions.   The patient was advised to call back or seek an in-person evaluation if the symptoms worsen or if the condition fails to improve as anticipated.  The above assessment and management plan was discussed with the patient. The patient verbalized understanding of and has agreed to the management plan. Patient is aware to call the clinic if symptoms persist or worsen. Patient is aware when to return to the clinic for a follow-up visit. Patient educated on when it is appropriate to go to the emergency department.   Time call ended: 11:30 PM  I provided 10 minutes of non-face-to-face time during this encounter.    Ivy Lynn, NP

## 2020-07-23 LAB — NOVEL CORONAVIRUS, NAA: SARS-CoV-2, NAA: NOT DETECTED

## 2020-07-23 LAB — SARS-COV-2, NAA 2 DAY TAT

## 2020-07-24 ENCOUNTER — Ambulatory Visit: Payer: Medicare HMO

## 2020-07-27 ENCOUNTER — Telehealth: Payer: Self-pay | Admitting: *Deleted

## 2020-07-27 NOTE — Chronic Care Management (AMB) (Signed)
  Chronic Care Management   Note  07/27/2020 Name: DUWANE GEWIRTZ MRN: [id number] DOB: 03/18/1941  [name] is a 80 y.o. year old male who is a primary care patient of Stacks, Cletus Gash, MD. I reached out to [name] by phone today in response to a referral sent by Mr. [employer]ICODEM[employer] health plan.     Mr. Iorio was given information about Chronic Care Management services today including:  1. CCM service includes personalized support from designated clinical staff supervised by his physician, including individualized plan of care and coordination with other care providers 2. 24/7 contact phone numbers for assistance for urgent and routine care needs. 3. Service will only be billed when office clinical staff spend 20 minutes or more in a month to coordinate care. 4. Only one practitioner may furnish and bill the service in a calendar month. 5. The patient may stop CCM services at any time (effective at the end of the month) by phone call to the office staff. 6. The patient will be responsible for cost sharing (co-pay) of up to 20% of the service fee (after annual deductible is met).  Patient agreed to services and verbal consent obtained.   Follow up plan: Telephone appointment with care management team member scheduled for:08/10/2020  [facility]

## 2020-07-28 ENCOUNTER — Other Ambulatory Visit: Payer: Self-pay | Admitting: *Deleted

## 2020-07-28 DIAGNOSIS — N400 Enlarged prostate without lower urinary tract symptoms: Secondary | ICD-10-CM

## 2020-07-28 DIAGNOSIS — K219 Gastro-esophageal reflux disease without esophagitis: Secondary | ICD-10-CM

## 2020-07-28 MED ORDER — TAMSULOSIN HCL 0.4 MG PO CAPS: ORAL_CAPSULE | ORAL | 0 refills | Status: DC

## 2020-07-28 MED ORDER — SUCRALFATE 1 G PO TABS: ORAL_TABLET | ORAL | 0 refills | Status: DC

## 2020-08-01 ENCOUNTER — Encounter: Payer: Self-pay | Admitting: Internal Medicine

## 2020-08-01 ENCOUNTER — Ambulatory Visit: Payer: Medicare HMO | Admitting: Internal Medicine

## 2020-08-01 VITALS — BP 128/70 | HR 84 | Ht 73.0 in | Wt 274.0 lb

## 2020-08-01 DIAGNOSIS — E65 Localized adiposity: Secondary | ICD-10-CM

## 2020-08-01 DIAGNOSIS — K582 Mixed irritable bowel syndrome: Secondary | ICD-10-CM | POA: Diagnosis not present

## 2020-08-01 NOTE — Progress Notes (Signed)
Cristian Moore 80 y.o. 1941-04-05 [id number]  Assessment & Plan:   Encounter Diagnoses  Name Primary?  . Irritable bowel syndrome with both constipation and diarrhea Yes  . Abdominal obesity     Treat with Metamucil 2 teaspoons daily on a regular basis.  Titrate for effect.  Return as needed regarding this.  We had a discussion regarding obesity and abdominal obesity.  He and his wife are interested in lower carbohydrate eating and perhaps even intermittent fasting.  Read The Obesity Code by Dr. Sharman Moore and try to implement changes recommend Handouts on insulin resistance and proper food choices to reduce that and the use of restricted feeding provided to the patient. Have a long-term approach to this and do not expect rapid results but have a 1 to 2-year timeframe to change her eating and to become fat adapted.   Subjective:   Chief Complaint: Irregular bowel habits  HPI The patient is an 80 year old white man here with his wife, having had recent colonoscopy for screening by Dr. Bronson Moore feeling in for me, who was complaining of alternating defecation that Dr. Loletha Moore thought might be related to his prior rectosigmoid resection for recurrent diverticulitis.  Office follow-up for evaluation and treatment was recommended.   Colonoscopy in December 2021 demonstrated a diminutive adenoma, some remaining residual diverticulosis, rectosigmoid anastomosis and internal hemorrhoids.  The patient thinks that his alternating constipation and diarrhea may have become a problem after his rectosigmoid resection in 2014.  He indicates that when he takes Metamucil 2 teaspoons a day he generally does pretty well.  The problems he struggles with or what he calls constipation which are hard balls of stool with incomplete defecation having to return 2 or 3 times after the initial stool, and then about once a week or so he will have loose stools that become watery associated with some cramps any  sort of unloads with multiple bowel movements.  He says that when the Metamucil container gets put into the cabinet he forgets about it and does not take it regularly. \ He and his wife are also concerned about trying to lose some weight as they have gotten larger particular with abdominal adiposity during the Covid pandemic. Allergies  Allergen Reactions  . Penicillins Rash    Over 60 years ago Has patient had a PCN reaction causing immediate rash, facial/tongue/throat swelling, SOB or lightheadedness with hypotension: Unknown Has patient had a PCN reaction causing severe rash involving mucus membranes or skin necrosis: Unknown Has patient had a PCN reaction that required hospitalization: No Has patient had a PCN reaction occurring within the last 10 years: No If all of the above answers are "NO", then may proceed with Cephalosporin use.    Current Meds  Medication Sig  . acetaminophen (TYLENOL) 500 MG tablet Take 1,000 mg by mouth every 6 (six) hours as needed for moderate pain.  Marland Kitchen albuterol (VENTOLIN HFA) 108 (90 Base) MCG/ACT inhaler Inhale 2 puffs into the lungs every 6 (six) hours as needed for wheezing or shortness of breath.  Marland Kitchen aspirin EC 81 MG tablet Take 81 mg by mouth daily. Swallow whole.  . Cholecalciferol (VITAMIN D) 50 MCG (2000 UT) tablet Take 2,000 Units by mouth daily.  Marland Kitchen dextromethorphan-guaiFENesin (MUCINEX DM) 30-600 MG 12hr tablet Take 1 tablet by mouth 2 (two) times daily.  Marland Kitchen escitalopram (LEXAPRO) 20 MG tablet Take 1/2 (one-half) tablet by mouth once daily  . famotidine (PEPCID) 10 MG tablet Take 10 mg by mouth 2 (  two) times daily.  . fluticasone (FLONASE) 50 MCG/ACT nasal spray USE 2 SPRAYS IN EACH       NOSTRIL DAILY  . HYDROcodone-acetaminophen (NORCO/VICODIN) 5-325 MG tablet Will take 1/2 tablet as needed  . hydroxypropyl methylcellulose / hypromellose (ISOPTO TEARS / GONIOVISC) 2.5 % ophthalmic solution Place 1 drop into both eyes daily as needed for dry eyes.   Cristian Moore Docker Oil 350 MG CAPS Take 350 mg by mouth 2 (two) times daily.  . metFORMIN (GLUCOPHAGE) 500 MG tablet Take 1 tablet (500 mg total) by mouth daily with breakfast.  . metoprolol tartrate (LOPRESSOR) 50 MG tablet TAKE 1 TABLET TWICE A DAY  . montelukast (SINGULAIR) 10 MG tablet TAKE 1 TABLET AT BEDTIME  . Multiple Vitamins-Minerals (CENTRUM SILVER PO) Take 1 tablet by mouth daily.  . nitroGLYCERIN (NITROSTAT) 0.4 MG SL tablet DISSOLVE ONE TABLET UNDER THE TONGUE EVERY 5 MINUTES AS NEEDED FOR CHEST PAIN.  DO NOT EXCEED A TOTAL OF 3 DOSES IN 15 MINUTES (Patient taking differently: DISSOLVE ONE TABLET UNDER THE TONGUE EVERY 5 MINUTES AS NEEDED FOR CHEST PAIN.  DO NOT EXCEED A TOTAL OF 3 DOSES IN 15 MINUTES)  . ONETOUCH DELICA LANCETS 99991111 MISC Check BS BID and PRN. DX. E11.8  . ONETOUCH VERIO test strip USE TO CHECK BLOOD SUGAR TWICE DAILY AND AS NEEDED  . PRESCRIPTION MEDICATION Inhale 1 application into the lungs at bedtime. "CPAP" MACHINE  . simvastatin (ZOCOR) 20 MG tablet TAKE 1 TABLET AT BEDTIME  . sodium chloride (OCEAN) 0.65 % nasal spray Place 1 spray into the nose daily as needed for congestion.   Marland Kitchen Spacer/Aero-Holding Chambers (AEROCHAMBER MV) inhaler Use as instructed  . sucralfate (CARAFATE) 1 g tablet TAKE 1 TABLET DAILY AS     NEEDED FOR STOMACH COATING  . tamsulosin (FLOMAX) 0.4 MG CAPS capsule TAKE 2 CAPSULES DAILY AFTERSUPPER  . zolpidem (AMBIEN) 10 MG tablet Take 1 tablet (10 mg total) by mouth at bedtime.   Past Medical History:  Diagnosis Date  . Allergy   . Arthritis   . Asthma   . Benign prostatic hypertrophy   . Carotid stenosis    Carotid US (07/2013):  Bilateral 1-39% ICA (f/u 2 yrs)  . Cataract    mild  . Chest pain    a. 01/2013 Cath: LM nl, LAD 25p, D1 25p, LCX nl, RCA 25p, PDA nl, RPL nl, EF 55%;  b. 03/2015 MV: EF 59%, no ischemia; c. 03/2016 MV: basal inf/mid inf defect, likely artifact, EF 48%, low risk.  . Chronic Dyspnea on exertion    a. See cardiac eval  under CAD heading;  b. 03/2015 Echo: EF 50-55%, gr1 DD, triv AI;  b. 03/2015 CTA Chest: No PE;  c. 05/2016 CPX: no significant cardi-pulmonary limitation noted. Suspect obesity contributing to exertional symptoms.  . Diabetes mellitus without complication ([place]) 123456  . Difficulty urinating   . Diverticulitis   . Diverticulosis   . Exogenous obesity   . GERD (gastroesophageal reflux disease)    moe like heart burn  . H/O blepharoplasty 07/27/2013  . H/O hiatal hernia   . Hernia    Umbulical   . History of adenomatous polyp of colon 1991  . Hyperlipidemia   . Laceration  01/11/2009    Deep laceration left index finger dorsally  . Musculoskeletal pain    in the right shoulder - S/P ROTATOR CUFF REPAIR-BUT STILL HAS PAIN  . Obstructive sleep apnea     CPAP  AUTO SET 6 TO 20 - USUALLY SETTLES OUT AT 10  . Rectal bleeding    PT ATTRIBUTES TO HEMORRHOIDS  . Sleep apnea    wears cpap   . SUPRAVENTRICULAR TACHYCARDIA 08/01/2009   Qualifier: Diagnosis of  By: Lovette Cliche, CNA, Christy    . Thyroiditis   . Ulcer 2008  . Walking pneumonia    Past Surgical History:  Procedure Laterality Date  . COLON SURGERY  07/2012  . COLONOSCOPY     for polyps  . ELBOW BURSA SURGERY    . Eyelid Surgery Bilateral 05/2013  . INGUINAL HERNIA REPAIR    . KIDNEY STONE SURGERY    . KNEE SURGERY     meniscus right knee  . LAPAROSCOPIC SIGMOID COLECTOMY  07/23/2012   Procedure: LAPAROSCOPIC SIGMOID COLECTOMY;  Surgeon: [name], MD;  Location: [facility];  Service: General;  Laterality: N/A;  Laparoscopic Sigmoid Colectomy  . LEFT HEART CATHETERIZATION WITH CORONARY ANGIOGRAM N/A 02/22/2013   Procedure: LEFT HEART CATHETERIZATION WITH CORONARY ANGIOGRAM;  Surgeon: [name], MD;  Location: [facility] CATH LAB;  Service: Cardiovascular;  Laterality: N/A;  . PROCTOSCOPY  07/23/2012   Procedure: PROCTOSCOPY;  Surgeon: [name], MD;  Location: [facility];  Service: General;  Laterality: N/A;  Rigid Proctoscopy  .  ROTATOR CUFF REPAIR    . TONSILLECTOMY    . TOTAL HIP ARTHROPLASTY Right 05/27/2018   Procedure: RIGHT TOTAL HIP ARTHROPLASTY ANTERIOR APPROACH;  Surgeon: [name], MD;  Location: [facility];  Service: Orthopedics;  Laterality: Right;  133min  . UMBILICAL HERNIA REPAIR  07/23/2012   Procedure: HERNIA REPAIR UMBILICAL ADULT;  Surgeon: [name], MD;  Location: [facility];  Service: General;  Laterality: N/A;  Primary Umbilical Hernia Repair   Social History   Social History Narrative   Lives at home with wife      [facility] (09/16/16):   Originally from [facility]. Previously served & lived in [place], [place], & [place]. 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 He also worked in [employer]. No pets currently. Remote bird exposure. No mold or hot tub exposure. Enjoys playing banjo as well as hunting. He also raises cattle. Has carpet in his bedroom. No feather bedding. No indoor plants.    family history includes Diverticulitis in his daughter and son; Heart disease in his mother; Lung cancer in his maternal uncle; Stroke in his mother.   Review of Systems As per HPI  Objective:   Physical Exam BP 128/70   Pulse 84   Ht 6\' 1"  (1.854 m)   Wt 274 lb (124.3 kg)   BMI 36.15 kg/m   Total time 22 minutes

## 2020-08-01 NOTE — Patient Instructions (Addendum)
  Read The Obesity Code by Dr. Sharman Cheek    Read handouts on insulin resistance and proper food choices to reduce that and the use of restricted feeding provided to the patient. Have a long-term approach to this and do not expect rapid results but have a 1 to 2-year timeframe to change her eating and to become fat adapted.   Please take 2 teaspoons of metamucil daily.   I appreciate the opportunity to care for you. Silvano Rusk, MD, [facility]

## 2020-08-09 ENCOUNTER — Telehealth: Payer: Self-pay

## 2020-08-09 NOTE — Telephone Encounter (Signed)
  Prescription Request  08/09/2020  What is the name of the medication or equipment?   Have you contacted your pharmacy to request a refill? (if applicable) YES  Which pharmacy would you like this sent to?    Patient notified that their request is being sent to the clinical staff for review and that they should receive a response within 2 business days.

## 2020-08-09 NOTE — Telephone Encounter (Signed)
Patient will be ok with his medication until his appt

## 2020-08-10 ENCOUNTER — Other Ambulatory Visit: Payer: Self-pay | Admitting: *Deleted

## 2020-08-10 ENCOUNTER — Ambulatory Visit: Payer: Medicare HMO | Admitting: *Deleted

## 2020-08-10 DIAGNOSIS — E118 Type 2 diabetes mellitus with unspecified complications: Secondary | ICD-10-CM

## 2020-08-10 DIAGNOSIS — I10 Essential (primary) hypertension: Secondary | ICD-10-CM

## 2020-08-10 MED ORDER — ESCITALOPRAM OXALATE 20 MG PO TABS: ORAL_TABLET | ORAL | 0 refills | Status: DC

## 2020-08-10 NOTE — Patient Instructions (Signed)
Plan:  . The patient has been provided with contact information for the care management team and has been advised to call if they would like to re-enroll in the CCM program to receive care management and care coordination services.  . CCM enrollment status changed to "previously enrolled" as per patient request on 08/10/2020  to discontinue enrollment. Case closed to case management services in primary care home.  . Patient was provided with general disease management education and encouraged to follow-up with their PCP and specialists as recommended.   Chong Sicilian, BSN, RN-BC Embedded Chronic Care Manager [facility] Direct Dial: [phone]

## 2020-08-10 NOTE — Chronic Care Management (AMB) (Signed)
  Chronic Care Management   Initial Visit Note  08/10/2020 Name: Cristian Moore MRN: [id number] DOB: 1940/11/15  Referred by: Cristian Fraise, MD Reason for referral : Chronic Care Management (Initial Visit)   Cristian Moore is a 80 y.o. year old male who is a primary care patient of Cristian Moore, Cristian Gash, MD. The CCM team was consulted for assistance with chronic disease management and care coordination needs related to HTN, DM and Pulmonary Disease  Review of patient status, including review of consultants reports, relevant laboratory and other test results was performed prior to outreach.   I spoke with Cristian Moore by telephone today regarding management of their chronic medical conditions. They do not have any CCM or resource needs and feel that their medical conditions are well managed. They appreciated the outreach, but do not feel that CCM services are needed at this time. They will reach out in the future if a need arises.   SDOH (Social Determinants of Health) assessments performed: Yes See Care Plan activities for detailed interventions related to SDOH     Plan:  . The patient has been provided with contact information for the care management team and has been advised to call if they would like to re-enroll in the CCM program to receive care management and care coordination services.  . CCM enrollment status changed to "previously enrolled" as per patient request on 08/10/2020  to discontinue enrollment. Case closed to case management services in primary care home.  . Patient was provided with general disease management education and encouraged to follow-up with their PCP and specialists as recommended.   Chong Sicilian, BSN, RN-BC Embedded Chronic Care Manager [facility] [place] Direct Dial: [phone]

## 2020-08-15 NOTE — Progress Notes (Unsigned)
Cardiology Office Note   Date:  08/16/2020   ID:  Cristian Moore, DOB [birth year], MRN [id number]  PCP:  [name], MD  Cardiologist:   [name], MD  Referring:   [name], MD   Chief Complaint  Patient presents with  . Shortness of Breath      History of Present Illness: Cristian Moore is a 80 y.o. male who presents for follow up of a  history of DM, nonobstructive CAD by cath 2014. In 2017 he  had a [employer] which demonstrated an EF of 48% with a defect present in the basal inferior and mid inferior location. This is most likely due to artifact but  inferior ischemia could not be ruled out.   Since I last saw him he had his hip surgery.  He has done well.  The patient denies any new symptoms such as chest discomfort, neck or arm discomfort. There has been no new shortness of breath, PND or orthopnea. There have been no reported palpitations, presyncope or syncope.  He climbs a ladder in his barn.  He might get short of breath with this but is unchanged from previous.  He has had no PND or orthopnea.  Has had no palpitations, presyncope or syncope.  He is somewhat limited by knee pain and back pain.   Past Medical History:  Diagnosis Date  . Allergy   . Arthritis   . Asthma   . Benign prostatic hypertrophy   . Carotid stenosis    Carotid US (07/2013):  Bilateral 1-39% ICA (f/u 2 yrs)  . Cataract    mild  . Chest pain    a. 01/2013 Cath: LM nl, LAD 25p, D1 25p, LCX nl, RCA 25p, PDA nl, RPL nl, EF 55%;  b. 03/2015 MV: EF 59%, no ischemia; c. 03/2016 MV: basal inf/mid inf defect, likely artifact, EF 48%, low risk.  . Chronic Dyspnea on exertion    a. See cardiac eval under CAD heading;  b. 03/2015 Echo: EF 50-55%, gr1 DD, triv AI;  b. 03/2015 CTA Chest: No PE;  c. 05/2016 CPX: no significant cardi-pulmonary limitation noted. Suspect obesity contributing to exertional symptoms.  . Diabetes mellitus without complication ([place]) 4268  . Difficulty urinating    . Diverticulitis   . Diverticulosis   . Exogenous obesity   . GERD (gastroesophageal reflux disease)    moe like heart burn  . H/O blepharoplasty 07/27/2013  . H/O hiatal hernia   . Hernia    Umbulical   . History of adenomatous polyp of colon 1991  . Hyperlipidemia   . Laceration  01/11/2009    Deep laceration left index finger dorsally  . Musculoskeletal pain    in the right shoulder - S/P ROTATOR CUFF REPAIR-BUT STILL HAS PAIN  . Obstructive sleep apnea     CPAP    AUTO SET 6 TO 20 - USUALLY SETTLES OUT AT 10  . Rectal bleeding    PT ATTRIBUTES TO HEMORRHOIDS  . Sleep apnea    wears cpap   . SUPRAVENTRICULAR TACHYCARDIA 08/01/2009   Qualifier: Diagnosis of  By: Lovette Cliche, CNA, Christy    . Thyroiditis   . Ulcer 2008  . Walking pneumonia     Past Surgical History:  Procedure Laterality Date  . COLON SURGERY  07/2012  . COLONOSCOPY     for polyps  . ELBOW BURSA SURGERY    . Eyelid Surgery Bilateral 05/2013  . INGUINAL HERNIA REPAIR    .  KIDNEY STONE SURGERY    . KNEE SURGERY     meniscus right knee  . LAPAROSCOPIC SIGMOID COLECTOMY  07/23/2012   Procedure: LAPAROSCOPIC SIGMOID COLECTOMY;  Surgeon: [name], MD;  Location: [facility];  Service: General;  Laterality: N/A;  Laparoscopic Sigmoid Colectomy  . LEFT HEART CATHETERIZATION WITH CORONARY ANGIOGRAM N/A 02/22/2013   Procedure: LEFT HEART CATHETERIZATION WITH CORONARY ANGIOGRAM;  Surgeon: [name], MD;  Location: [facility] CATH LAB;  Service: Cardiovascular;  Laterality: N/A;  . PROCTOSCOPY  07/23/2012   Procedure: PROCTOSCOPY;  Surgeon: [name], MD;  Location: [facility];  Service: General;  Laterality: N/A;  Rigid Proctoscopy  . ROTATOR CUFF REPAIR    . TONSILLECTOMY    . TOTAL HIP ARTHROPLASTY Right 05/27/2018   Procedure: RIGHT TOTAL HIP ARTHROPLASTY ANTERIOR APPROACH;  Surgeon: [name], MD;  Location: [facility];  Service: Orthopedics;  Laterality: Right;  147min  . UMBILICAL HERNIA REPAIR  07/23/2012    Procedure: HERNIA REPAIR UMBILICAL ADULT;  Surgeon: [name], MD;  Location: [facility];  Service: General;  Laterality: N/A;  Primary Umbilical Hernia Repair     Current Outpatient Medications  Medication Sig Dispense Refill  . acetaminophen (TYLENOL) 500 MG tablet Take 1,000 mg by mouth every 6 (six) hours as needed for moderate pain.    Marland Kitchen albuterol (VENTOLIN HFA) 108 (90 Base) MCG/ACT inhaler Inhale 2 puffs into the lungs every 6 (six) hours as needed for wheezing or shortness of breath. 18 g 3  . aspirin EC 81 MG tablet Take 81 mg by mouth daily. Swallow whole.    . Cholecalciferol (VITAMIN D) 50 MCG (2000 UT) tablet Take 2,000 Units by mouth daily.    Marland Kitchen dextromethorphan-guaiFENesin (MUCINEX DM) 30-600 MG 12hr tablet Take 1 tablet by mouth 2 (two) times daily. 30 tablet 0  . escitalopram (LEXAPRO) 20 MG tablet Take 1/2 (one-half) tablet by mouth once daily 45 tablet 0  . famotidine (PEPCID) 10 MG tablet Take 10 mg by mouth 2 (two) times daily.    . fluticasone (FLONASE) 50 MCG/ACT nasal spray USE 2 SPRAYS IN EACH       NOSTRIL DAILY 48 g 1  . HYDROcodone-acetaminophen (NORCO/VICODIN) 5-325 MG tablet Will take 1/2 tablet as needed    . hydroxypropyl methylcellulose / hypromellose (ISOPTO TEARS / GONIOVISC) 2.5 % ophthalmic solution Place 1 drop into both eyes daily as needed for dry eyes.    [name] Oil 350 MG CAPS Take 350 mg by mouth 2 (two) times daily.    . metFORMIN (GLUCOPHAGE) 500 MG tablet Take 1 tablet (500 mg total) by mouth daily with breakfast. 90 tablet 1  . metoprolol tartrate (LOPRESSOR) 50 MG tablet TAKE 1 TABLET TWICE A DAY 180 tablet 0  . montelukast (SINGULAIR) 10 MG tablet TAKE 1 TABLET AT BEDTIME 90 tablet 1  . Multiple Vitamins-Minerals (CENTRUM SILVER PO) Take 1 tablet by mouth daily.    . nitroGLYCERIN (NITROSTAT) 0.4 MG SL tablet DISSOLVE ONE TABLET UNDER THE TONGUE EVERY 5 MINUTES AS NEEDED FOR CHEST PAIN.  DO NOT EXCEED A TOTAL OF 3 DOSES IN 15 MINUTES (Patient  taking differently: DISSOLVE ONE TABLET UNDER THE TONGUE EVERY 5 MINUTES AS NEEDED FOR CHEST PAIN.  DO NOT EXCEED A TOTAL OF 3 DOSES IN 15 MINUTES) 25 tablet 11  . simvastatin (ZOCOR) 20 MG tablet TAKE 1 TABLET AT BEDTIME 90 tablet 3  . sodium chloride (OCEAN) 0.65 % nasal spray Place 1 spray into the  nose daily as needed for congestion.     Marland Kitchen Spacer/Aero-Holding Chambers (AEROCHAMBER MV) inhaler Use as instructed 1 each 0  . sucralfate (CARAFATE) 1 g tablet TAKE 1 TABLET DAILY AS     NEEDED FOR STOMACH COATING 90 tablet 0  . tamsulosin (FLOMAX) 0.4 MG CAPS capsule TAKE 2 CAPSULES DAILY AFTERSUPPER 180 capsule 0  . zolpidem (AMBIEN) 10 MG tablet Take 1 tablet (10 mg total) by mouth at bedtime. 90 tablet 1  . ONETOUCH DELICA LANCETS 06Y MISC Check BS BID and PRN. DX. E11.8 100 each 11  . ONETOUCH VERIO test strip USE TO CHECK BLOOD SUGAR TWICE DAILY AND AS NEEDED 100 each 11  . PRESCRIPTION MEDICATION Inhale 1 application into the lungs at bedtime. "CPAP" MACHINE     No current facility-administered medications for this visit.    Allergies:   Penicillins    ROS:  Please see the history of present illness.   Otherwise, review of systems are positive for none.   All other systems are reviewed and negative.    PHYSICAL EXAM: VS:  BP 120/78   Pulse 62   Ht 6\' 1"  (1.854 m)   Wt 275 lb (124.7 kg)   BMI 36.28 kg/m  , BMI Body mass index is 36.28 kg/m. GENERAL:  Well appearing NECK:  No jugular venous distention, waveform within normal limits, carotid upstroke brisk and symmetric, no bruits, no thyromegaly LUNGS:  Clear to auscultation bilaterally CHEST:  Unremarkable HEART:  PMI not displaced or sustained,S1 and S2 within normal limits, no S3, no S4, no clicks, no rubs, no murmurs ABD:  Flat, positive bowel sounds normal in frequency in pitch, no bruits, no rebound, no guarding, no midline pulsatile mass, no hepatomegaly, no splenomegaly EXT:  2 plus pulses throughout, no edema, no cyanosis  no clubbing   EKG:  EKG  ordered today. Sinus rhythm, rate 62, axis within normal limits, intervals, no acute ST-T wave changes.  08/16/2020  Recent Labs: 02/24/2020: ALT 13; BUN 14; Creatinine, Ser 1.16; Hemoglobin 13.9; Platelets 238; Potassium 4.2; Sodium 142; TSH 0.525    Lipid Panel    Component Value Date/Time   CHOL 162 02/24/2020 0809   TRIG 210 (H) 02/24/2020 0809   TRIG 111 01/22/2017 1027   HDL 43 02/24/2020 0809   HDL 39 (L) 01/22/2017 1027   CHOLHDL 3.8 02/24/2020 0809   CHOLHDL 3.1 02/21/2013 0212   VLDL 21 02/21/2013 0212   LDLCALC 84 02/24/2020 0809   LDLCALC 89 01/31/2014 1010      Wt Readings from Last 3 Encounters:  08/16/20 275 lb (124.7 kg)  08/01/20 274 lb (124.3 kg)  06/13/20 268 lb (121.6 kg)      Other studies Reviewed: Additional studies/ records that were reviewed today include: None Review of the above records demonstrates:    NA    ASSESSMENT AND PLAN:   CAD:   The patient has no new sypmtoms.  No further cardiovascular testing is indicated.  We will continue with aggressive risk reduction and meds as listed.  SOB: I think his shortness of breath is baseline related to weight and deconditioning and we talked about this at length.  HTN:   Blood pressure is at target.  No change in therapy.   DYSLIPIDEMIA:    LDL was 84 with an HDL of 43.  No change in therapy.   CAROTID STENOSIS:    He had some mild carotid stenosis in 2018.  This was bilateral.  I will  order carotid Dopplers.  OBESITY: We had a long discussion about this.  I had diet suggestions and activity suggestions.   Current medicines are reviewed at length with the patient today.  The patient does not have concerns regarding medicines.  The following changes have been made:  None  Labs/ tests ordered today include:     Orders Placed This Encounter  Procedures  . EKG 12-Lead  . VAS US CAROTID   Follow up me in one year. Ronnell Guadalajara, MD  08/16/2020  12:55 PM    [place]

## 2020-08-16 ENCOUNTER — Encounter: Payer: Self-pay | Admitting: Cardiology

## 2020-08-16 ENCOUNTER — Other Ambulatory Visit: Payer: Self-pay

## 2020-08-16 ENCOUNTER — Ambulatory Visit (INDEPENDENT_AMBULATORY_CARE_PROVIDER_SITE_OTHER): Payer: Medicare HMO | Admitting: Cardiology

## 2020-08-16 VITALS — BP 120/78 | HR 62 | Ht 73.0 in | Wt 275.0 lb

## 2020-08-16 DIAGNOSIS — I2510 Atherosclerotic heart disease of native coronary artery without angina pectoris: Secondary | ICD-10-CM

## 2020-08-16 DIAGNOSIS — I10 Essential (primary) hypertension: Secondary | ICD-10-CM | POA: Diagnosis not present

## 2020-08-16 DIAGNOSIS — E785 Hyperlipidemia, unspecified: Secondary | ICD-10-CM | POA: Diagnosis not present

## 2020-08-16 DIAGNOSIS — I6529 Occlusion and stenosis of unspecified carotid artery: Secondary | ICD-10-CM

## 2020-08-16 NOTE — Patient Instructions (Signed)
Medication Instructions:  The current medical regimen is effective;  continue present plan and medications.  *If you need a refill on your cardiac medications before your next appointment, please call your pharmacy*  Testing/Procedures: Your physician has requested that you have a carotid duplex. This test is an ultrasound of the carotid arteries in your neck. It looks at blood flow through these arteries that supply the brain with blood. Allow one hour for this exam. There are no restrictions or special instructions.  Follow-Up: At [facility], you and your health needs are our priority.  As part of our continuing mission to provide you with exceptional heart care, we have created designated Provider Care Teams.  These Care Teams include your primary Cardiologist (physician) and Advanced Practice Providers (APPs -  Physician Assistants and Nurse Practitioners) who all work together to provide you with the care you need, when you need it.  We recommend signing up for the patient portal called "MyChart".  Sign up information is provided on this After Visit Summary.  MyChart is used to connect with patients for Virtual Visits (Telemedicine).  Patients are able to view lab/test results, encounter notes, upcoming appointments, etc.  Non-urgent messages can be sent to your provider as well.   To learn more about what you can do with MyChart, go to [email or web address].    Your next appointment:   12 month(s)  The format for your next appointment:   In Person  Provider:   [name], MD   Thank you for choosing [facility]!!

## 2020-08-17 DIAGNOSIS — M170 Bilateral primary osteoarthritis of knee: Secondary | ICD-10-CM | POA: Diagnosis not present

## 2020-08-18 ENCOUNTER — Other Ambulatory Visit: Payer: Self-pay

## 2020-08-18 ENCOUNTER — Ambulatory Visit (HOSPITAL_COMMUNITY)
Admission: RE | Admit: 2020-08-18 | Discharge: 2020-08-18 | Disposition: A | Discharge status: Home / Self Care | Payer: Medicare HMO | Source: Ambulatory Visit | Attending: Cardiology | Admitting: Cardiology

## 2020-08-18 DIAGNOSIS — I6529 Occlusion and stenosis of unspecified carotid artery: Secondary | ICD-10-CM | POA: Insufficient documentation

## 2020-08-24 DIAGNOSIS — M170 Bilateral primary osteoarthritis of knee: Secondary | ICD-10-CM | POA: Diagnosis not present

## 2020-08-28 ENCOUNTER — Encounter: Payer: Self-pay | Admitting: Family Medicine

## 2020-08-28 ENCOUNTER — Ambulatory Visit (INDEPENDENT_AMBULATORY_CARE_PROVIDER_SITE_OTHER): Payer: Medicare HMO | Admitting: Family Medicine

## 2020-08-28 ENCOUNTER — Other Ambulatory Visit: Payer: Self-pay

## 2020-08-28 VITALS — BP 122/71 | HR 71 | Temp 97.2°F | Ht 73.0 in | Wt 274.0 lb

## 2020-08-28 DIAGNOSIS — E039 Hypothyroidism, unspecified: Secondary | ICD-10-CM | POA: Diagnosis not present

## 2020-08-28 DIAGNOSIS — E782 Mixed hyperlipidemia: Secondary | ICD-10-CM

## 2020-08-28 DIAGNOSIS — I2510 Atherosclerotic heart disease of native coronary artery without angina pectoris: Secondary | ICD-10-CM | POA: Diagnosis not present

## 2020-08-28 DIAGNOSIS — I10 Essential (primary) hypertension: Secondary | ICD-10-CM | POA: Diagnosis not present

## 2020-08-28 DIAGNOSIS — E118 Type 2 diabetes mellitus with unspecified complications: Secondary | ICD-10-CM | POA: Diagnosis not present

## 2020-08-28 DIAGNOSIS — Z79899 Other long term (current) drug therapy: Secondary | ICD-10-CM

## 2020-08-28 LAB — BAYER DCA HB A1C WAIVED: HB A1C (BAYER DCA - WAIVED): 6 % (ref <7.0)

## 2020-08-28 MED ORDER — METOPROLOL TARTRATE 50 MG PO TABS: 50.0000 mg | ORAL_TABLET | Freq: Two times a day (BID) | ORAL | 1 refills | Status: DC

## 2020-08-28 MED ORDER — METFORMIN HCL 500 MG PO TABS: 500.0000 mg | ORAL_TABLET | Freq: Every day | ORAL | 1 refills | Status: DC

## 2020-08-28 MED ORDER — ZOLPIDEM TARTRATE 10 MG PO TABS: 10.0000 mg | ORAL_TABLET | Freq: Every day | ORAL | 1 refills | Status: DC

## 2020-08-28 NOTE — Addendum Note (Signed)
Addended by: Karle Plumber on: 08/28/2020 09:46 AM   Modules accepted: Orders

## 2020-08-28 NOTE — Progress Notes (Signed)
Subjective:  Patient ID: Cristian Moore,  male    DOB: July 24, 1940  Age: 80 y.o.    CC: Diabetes (6 month follow up)   HPI Cristian Moore presents for  follow-up of hypertension. Patient has no history of headache chest pain or shortness of breath or recent cough. Patient also denies symptoms of TIA such as numbness weakness lateralizing. Patient denies side effects from medication. States taking it regularly.  Patient also  in for follow-up of elevated cholesterol. Doing well without complaints on current medication. Denies side effects  including myalgia and arthralgia and nausea. Also in today for liver function testing. Currently no chest pain, shortness of breath or other cardiovascular related symptoms noted.  Follow-up of diabetes. Patient does check blood sugar at home. Readings run between 100-115 fasting. Rarely below100. Patient denies symptoms such as excessive hunger or urinary frequency, excessive hunger, nausea No significant hypoglycemic spells noted. Medications reviewed. Pt reports taking them regularly. Pt. denies complication/adverse reaction today.   Legs ache at night. Using 2 tylenol to get relief.    History Alix has a past medical history of Allergy, Arthritis, Asthma, Benign prostatic hypertrophy, Carotid stenosis, Cataract, Chest pain, Chronic Dyspnea on exertion, Diabetes mellitus without complication ([place]) (5643), Difficulty urinating, Diverticulitis, Diverticulosis, Exogenous obesity, GERD (gastroesophageal reflux disease), H/O blepharoplasty (07/27/2013), H/O hiatal hernia, Hernia, History of adenomatous polyp of colon (1991), Hyperlipidemia, Laceration ( 01/11/2009 ), Musculoskeletal pain, Obstructive sleep apnea, Rectal bleeding, Sleep apnea, SUPRAVENTRICULAR TACHYCARDIA (08/01/2009), Thyroiditis, Ulcer (2008), and Walking pneumonia.   He has a past surgical history that includes Inguinal hernia repair; Kidney stone surgery; Tonsillectomy; Colonoscopy;  Rotator cuff repair; Knee surgery; Elbow bursa surgery; Laparoscopic sigmoid colectomy (07/23/2012); Umbilical hernia repair (07/23/2012); Proctoscopy (07/23/2012); Eyelid Surgery (Bilateral, 05/2013); left heart catheterization with coronary angiogram (N/A, 02/22/2013); Total hip arthroplasty (Right, 05/27/2018); and Colon surgery (07/2012).   His family history includes Diverticulitis in his daughter and son; Heart disease in his mother; Lung cancer in his maternal uncle; Stroke in his mother.He reports that he quit smoking about 21 years ago. His smoking use included cigars. He has never used smokeless tobacco. He reports that he does not drink alcohol and does not use drugs.  Current Outpatient Medications on File Prior to Visit  Medication Sig Dispense Refill  . acetaminophen (TYLENOL) 500 MG tablet Take 1,000 mg by mouth every 6 (six) hours as needed for moderate pain.    Marland Kitchen albuterol (VENTOLIN HFA) 108 (90 Base) MCG/ACT inhaler Inhale 2 puffs into the lungs every 6 (six) hours as needed for wheezing or shortness of breath. 18 g 3  . aspirin EC 81 MG tablet Take 81 mg by mouth daily. Swallow whole.    . Cholecalciferol (VITAMIN D) 50 MCG (2000 UT) tablet Take 2,000 Units by mouth daily.    Marland Kitchen dextromethorphan-guaiFENesin (MUCINEX DM) 30-600 MG 12hr tablet Take 1 tablet by mouth 2 (two) times daily. 30 tablet 0  . escitalopram (LEXAPRO) 20 MG tablet Take 1/2 (one-half) tablet by mouth once daily 45 tablet 0  . famotidine (PEPCID) 10 MG tablet Take 10 mg by mouth 2 (two) times daily.    . fluticasone (FLONASE) 50 MCG/ACT nasal spray USE 2 SPRAYS IN EACH       NOSTRIL DAILY 48 g 1  . HYDROcodone-acetaminophen (NORCO/VICODIN) 5-325 MG tablet Will take 1/2 tablet as needed    . hydroxypropyl methylcellulose / hypromellose (ISOPTO TEARS / GONIOVISC) 2.5 % ophthalmic solution Place 1 drop into both eyes daily  as needed for dry eyes.    [name] Oil 350 MG CAPS Take 350 mg by mouth 2 (two) times daily.    .  montelukast (SINGULAIR) 10 MG tablet TAKE 1 TABLET AT BEDTIME 90 tablet 1  . Multiple Vitamins-Minerals (CENTRUM SILVER PO) Take 1 tablet by mouth daily.    . nitroGLYCERIN (NITROSTAT) 0.4 MG SL tablet DISSOLVE ONE TABLET UNDER THE TONGUE EVERY 5 MINUTES AS NEEDED FOR CHEST PAIN.  DO NOT EXCEED A TOTAL OF 3 DOSES IN 15 MINUTES (Patient taking differently: DISSOLVE ONE TABLET UNDER THE TONGUE EVERY 5 MINUTES AS NEEDED FOR CHEST PAIN.  DO NOT EXCEED A TOTAL OF 3 DOSES IN 15 MINUTES) 25 tablet 11  . ONETOUCH DELICA LANCETS 76H MISC Check BS BID and PRN. DX. E11.8 100 each 11  . ONETOUCH VERIO test strip USE TO CHECK BLOOD SUGAR TWICE DAILY AND AS NEEDED 100 each 11  . PRESCRIPTION MEDICATION Inhale 1 application into the lungs at bedtime. "CPAP" MACHINE    . simvastatin (ZOCOR) 20 MG tablet TAKE 1 TABLET AT BEDTIME 90 tablet 3  . sodium chloride (OCEAN) 0.65 % nasal spray Place 1 spray into the nose daily as needed for congestion.     Marland Kitchen Spacer/Aero-Holding Chambers (AEROCHAMBER MV) inhaler Use as instructed 1 each 0  . sucralfate (CARAFATE) 1 g tablet TAKE 1 TABLET DAILY AS     NEEDED FOR STOMACH COATING 90 tablet 0  . tamsulosin (FLOMAX) 0.4 MG CAPS capsule TAKE 2 CAPSULES DAILY AFTERSUPPER 180 capsule 0   No current facility-administered medications on file prior to visit.    ROS Review of Systems  Constitutional: Negative for fever.  Respiratory: Negative for shortness of breath.   Cardiovascular: Negative for chest pain.  Musculoskeletal: Negative for arthralgias.  Skin: Negative for rash.    Objective:  BP 122/71   Pulse 71   Temp (!) 97.2 F (36.2 C) (Temporal)   Ht $R'6\' 1"'nT$  (1.854 m)   Wt 274 lb (124.3 kg)   SpO2 95%   BMI 36.15 kg/m   BP Readings from Last 3 Encounters:  08/28/20 122/71  08/16/20 120/78  08/01/20 128/70    Wt Readings from Last 3 Encounters:  08/28/20 274 lb (124.3 kg)  08/16/20 275 lb (124.7 kg)  08/01/20 274 lb (124.3 kg)     Physical Exam Vitals  reviewed.  Constitutional:      Appearance: He is well-developed and well-nourished.  HENT:     Head: Normocephalic and atraumatic.     Right Ear: Tympanic membrane and external ear normal. No decreased hearing noted.     Left Ear: Tympanic membrane and external ear normal. No decreased hearing noted.     Mouth/Throat:     Pharynx: No oropharyngeal exudate or posterior oropharyngeal erythema.  Eyes:     Pupils: Pupils are equal, round, and reactive to light.  Cardiovascular:     Rate and Rhythm: Normal rate and regular rhythm.     Heart sounds: No murmur heard.   Pulmonary:     Effort: No respiratory distress.     Breath sounds: Normal breath sounds.  Abdominal:     General: Bowel sounds are normal.     Palpations: Abdomen is soft. There is no mass.     Tenderness: There is no abdominal tenderness.  Musculoskeletal:     Cervical back: Normal range of motion and neck supple.     Diabetic Foot Exam - Simple   Simple Foot Form Diabetic  Foot exam was performed with the following findings: Yes 08/28/2020  8:47 AM  Visual Inspection No deformities, no ulcerations, no other skin breakdown bilaterally: Yes Sensation Testing Intact to touch and monofilament testing bilaterally: Yes Pulse Check Posterior Tibialis and Dorsalis pulse intact bilaterally: Yes Comments       Assessment & Plan:   Tayari was seen today for diabetes.  Diagnoses and all orders for this visit:  Type 2 diabetes mellitus with complication, without long-term current use of insulin (HCC) -     Microalbumin / creatinine urine ratio -     Bayer DCA Hb A1c Waived -     CBC with Differential/Platelet -     CMP14+EGFR -     Lipid panel -     metFORMIN (GLUCOPHAGE) 500 MG tablet; Take 1 tablet (500 mg total) by mouth daily with breakfast.  Essential hypertension -     CBC with Differential/Platelet -     CMP14+EGFR -     Lipid panel -     metoprolol tartrate (LOPRESSOR) 50 MG tablet; Take 1 tablet (50  mg total) by mouth 2 (two) times daily.  Hypothyroidism, unspecified type -     CBC with Differential/Platelet -     CMP14+EGFR -     Lipid panel -     Thyroid Panel With TSH  Mixed hyperlipidemia -     CBC with Differential/Platelet -     CMP14+EGFR -     Lipid panel  Non-Obstructive CAD -     metoprolol tartrate (LOPRESSOR) 50 MG tablet; Take 1 tablet (50 mg total) by mouth 2 (two) times daily.  Other orders -     zolpidem (AMBIEN) 10 MG tablet; Take 1 tablet (10 mg total) by mouth at bedtime.   I have changed Kendric B. Rishel's metoprolol tartrate. I am also having him maintain his Vitamin D, Multiple Vitamins-Minerals (CENTRUM SILVER PO), sodium chloride, OneTouch Delica Lancets 93J, PRESCRIPTION MEDICATION, nitroGLYCERIN, AeroChamber MV, OneTouch Verio, hydroxypropyl methylcellulose / hypromellose, acetaminophen, Krill Oil, albuterol, simvastatin, fluticasone, montelukast, HYDROcodone-acetaminophen, famotidine, aspirin EC, dextromethorphan-guaiFENesin, sucralfate, tamsulosin, escitalopram, metFORMIN, and zolpidem.  Meds ordered this encounter  Medications  . metoprolol tartrate (LOPRESSOR) 50 MG tablet    Sig: Take 1 tablet (50 mg total) by mouth 2 (two) times daily.    Dispense:  180 tablet    Refill:  1  . metFORMIN (GLUCOPHAGE) 500 MG tablet    Sig: Take 1 tablet (500 mg total) by mouth daily with breakfast.    Dispense:  90 tablet    Refill:  1  . zolpidem (AMBIEN) 10 MG tablet    Sig: Take 1 tablet (10 mg total) by mouth at bedtime.    Dispense:  90 tablet    Refill:  1     Follow-up: No follow-ups on file.  [name], M.D.

## 2020-08-29 LAB — CMP14+EGFR
ALT: 20 IU/L (ref 0–44)
AST: 23 IU/L (ref 0–40)
Albumin/Globulin Ratio: 2.3 — ABNORMAL HIGH (ref 1.2–2.2)
Albumin: 4.5 g/dL (ref 3.7–4.7)
Alkaline Phosphatase: 42 IU/L — ABNORMAL LOW (ref 44–121)
BUN/Creatinine Ratio: 13 (ref 10–24)
BUN: 15 mg/dL (ref 8–27)
Bilirubin Total: 0.5 mg/dL (ref 0.0–1.2)
CO2: 25 mmol/L (ref 20–29)
Calcium: 10.3 mg/dL — ABNORMAL HIGH (ref 8.6–10.2)
Chloride: 103 mmol/L (ref 96–106)
Creatinine, Ser: 1.18 mg/dL (ref 0.76–1.27)
Globulin, Total: 2 g/dL (ref 1.5–4.5)
Glucose: 119 mg/dL — ABNORMAL HIGH (ref 65–99)
Potassium: 5 mmol/L (ref 3.5–5.2)
Sodium: 142 mmol/L (ref 134–144)
Total Protein: 6.5 g/dL (ref 6.0–8.5)
eGFR: 62 mL/min/{1.73_m2} (ref >59)

## 2020-08-29 LAB — CBC WITH DIFFERENTIAL/PLATELET
Basophils Absolute: 0.1 10*3/uL (ref 0.0–0.2)
Basos: 2 %
EOS (ABSOLUTE): 0.2 10*3/uL (ref 0.0–0.4)
Eos: 2 %
Hematocrit: 45.3 % (ref 37.5–51.0)
Hemoglobin: 14.9 g/dL (ref 13.0–17.7)
Immature Grans (Abs): 0 10*3/uL (ref 0.0–0.1)
Immature Granulocytes: 1 %
Lymphocytes Absolute: 2.7 10*3/uL (ref 0.7–3.1)
Lymphs: 34 %
MCH: 31.9 pg (ref 26.6–33.0)
MCHC: 32.9 g/dL (ref 31.5–35.7)
MCV: 97 fL (ref 79–97)
Monocytes Absolute: 0.9 10*3/uL (ref 0.1–0.9)
Monocytes: 11 %
Neutrophils Absolute: 4 10*3/uL (ref 1.4–7.0)
Neutrophils: 50 %
Platelets: 292 10*3/uL (ref 150–450)
RBC: 4.67 x10E6/uL (ref 4.14–5.80)
RDW: 12.5 % (ref 11.6–15.4)
WBC: 7.9 10*3/uL (ref 3.4–10.8)

## 2020-08-29 LAB — THYROID PANEL WITH TSH
Free Thyroxine Index: 1.3 (ref 1.2–4.9)
T3 Uptake Ratio: 24 % (ref 24–39)
T4, Total: 5.5 ug/dL (ref 4.5–12.0)
TSH: 0.727 u[IU]/mL (ref 0.450–4.500)

## 2020-08-29 LAB — MICROALBUMIN / CREATININE URINE RATIO
Creatinine, Urine: 87.2 mg/dL
Microalb/Creat Ratio: <3 mg/g creat (ref 0–29)
Microalbumin, Urine: <3 ug/mL

## 2020-08-29 LAB — LIPID PANEL
Chol/HDL Ratio: 3.4 ratio (ref 0.0–5.0)
Cholesterol, Total: 159 mg/dL (ref 100–199)
HDL: 47 mg/dL (ref >39)
LDL Chol Calc (NIH): 80 mg/dL (ref 0–99)
Triglycerides: 188 mg/dL — ABNORMAL HIGH (ref 0–149)
VLDL Cholesterol Cal: 32 mg/dL (ref 5–40)

## 2020-08-29 NOTE — Progress Notes (Signed)
Hello [place],  Your lab result is normal and/or stable.Some minor variations that are not significant are commonly marked abnormal, but do not represent any medical problem for you.  Best regards, Jevan Gaunt, M.D.

## 2020-08-31 DIAGNOSIS — M170 Bilateral primary osteoarthritis of knee: Secondary | ICD-10-CM | POA: Diagnosis not present

## 2020-09-04 ENCOUNTER — Ambulatory Visit (INDEPENDENT_AMBULATORY_CARE_PROVIDER_SITE_OTHER): Payer: Medicare HMO

## 2020-09-04 DIAGNOSIS — Z0000 Encounter for general adult medical examination without abnormal findings: Secondary | ICD-10-CM | POA: Diagnosis not present

## 2020-09-04 NOTE — Progress Notes (Signed)
MEDICARE ANNUAL WELLNESS VISIT  09/04/2020  Telephone Visit Disclaimer This Medicare AWV was conducted by telephone due to national recommendations for restrictions regarding the COVID-19 Pandemic (e.g. social distancing).  I verified, using two identifiers, that I am speaking with Cristian Moore or Cristian Moore authorized healthcare agent. I discussed the limitations, risks, security, and privacy concerns of performing an evaluation and management service by telephone and the potential availability of an in-person appointment in the future. The patient expressed understanding and agreed to proceed.  Location of Patient: Home Location of Provider (nurse):  [facility]  Subjective:    Cristian Moore is a 80 y.o. male patient of Stacks, Cletus Gash, MD who had a Medicare Annual Wellness Visit today via telephone. Cristian Moore is Retired and lives with Cristian Moore spouse. Cristian Moore has four children and one step child.  reports that Cristian Moore is socially active and does interact with friends/family regularly. Cristian Moore is minimally physically active and enjoys fishing and hunting.  Patient Care Team: [name], MD as PCP - General (Family Medicine) [name], MD as PCP - Cardiology (Cardiology) Gatha Mayer, MD as Consulting Physician (Gastroenterology) [name], MD as Consulting Physician (Cardiology) Anda Kraft, MD as Consulting Physician (Endocrinology) Tanda Rockers, MD as Consulting Physician (Pulmonary Disease) Netta Cedars, MD as Consulting Physician (Orthopedic Surgery) Suella Broad, MD as Consulting Physician (Physical Medicine and Rehabilitation) Wanda Plump, NP as Nurse Practitioner (Urology)  Advanced Directives 09/04/2020 06/13/2020 08/30/2019 08/27/2018 05/27/2018 05/19/2018 05/30/2017  Does Patient Have a Medical Advance Directive? No No No No No No No  Would patient like information on creating a medical advance directive? No - Patient declined No - Patient declined No - Patient declined  No - Patient declined No - Patient declined No - Patient declined -  Pre-existing out of facility DNR order (yellow form or pink MOST form) - - - - - - -    Hospital Utilization Over the Past 12 Months: # of hospitalizations or ER visits: 0 # of surgeries: 0  Review of Systems    Patient reports that his overall health is unchanged compared to last year.  History obtained from chart review and the patient  Patient Reported Readings (BP, Pulse, CBG, Weight, etc) none  Pain Assessment Pain : No/denies pain     Current Medications & Allergies (verified) Allergies as of 09/04/2020      Reactions   Penicillins Rash, Hives   Over 60 years ago Has patient had a PCN reaction causing immediate rash, facial/tongue/throat swelling, SOB or lightheadedness with hypotension: Unknown Has patient had a PCN reaction causing severe rash involving mucus membranes or skin necrosis: Unknown Has patient had a PCN reaction that required hospitalization: No Has patient had a PCN reaction occurring within the last 10 years: No If all of the above answers are "NO", then may proceed with Cephalosporin use.      Medication List       Accurate as of September 04, 2020  2:34 PM. If you have any questions, ask your nurse or doctor.        STOP taking these medications   dextromethorphan-guaiFENesin 30-600 MG 12hr tablet Commonly known as: MUCINEX DM     TAKE these medications   acetaminophen 500 MG tablet Commonly known as: TYLENOL Take 1,000 mg by mouth every 6 (six) hours as needed for moderate pain.   AeroChamber MV inhaler Use as instructed   albuterol 108 (90 Base) MCG/ACT inhaler Commonly known as: VENTOLIN HFA  Inhale 2 puffs into the lungs every 6 (six) hours as needed for wheezing or shortness of breath.   aspirin EC 81 MG tablet Take 81 mg by mouth daily. Swallow whole.   CENTRUM SILVER PO Take 1 tablet by mouth daily.   escitalopram 20 MG tablet Commonly known as: LEXAPRO Take  1/2 (one-half) tablet by mouth once daily   famotidine 10 MG tablet Commonly known as: PEPCID Take 10 mg by mouth 2 (two) times daily.   fluticasone 50 MCG/ACT nasal spray Commonly known as: FLONASE USE 2 SPRAYS IN EACH       NOSTRIL DAILY   HYDROcodone-acetaminophen 5-325 MG tablet Commonly known as: NORCO/VICODIN Will take 1/2 tablet as needed   hydroxypropyl methylcellulose / hypromellose 2.5 % ophthalmic solution Commonly known as: ISOPTO TEARS / GONIOVISC Place 1 drop into both eyes daily as needed for dry eyes.   Krill Oil 350 MG Caps Take 350 mg by mouth 2 (two) times daily.   metFORMIN 500 MG tablet Commonly known as: GLUCOPHAGE Take 1 tablet (500 mg total) by mouth daily with breakfast.   metoprolol tartrate 50 MG tablet Commonly known as: LOPRESSOR Take 1 tablet (50 mg total) by mouth 2 (two) times daily.   montelukast 10 MG tablet Commonly known as: SINGULAIR TAKE 1 TABLET AT BEDTIME   nitroGLYCERIN 0.4 MG SL tablet Commonly known as: Nitrostat DISSOLVE ONE TABLET UNDER THE TONGUE EVERY 5 MINUTES AS NEEDED FOR CHEST PAIN.  DO NOT EXCEED A TOTAL OF 3 DOSES IN 15 MINUTES   OneTouch Delica Lancets 53I Misc Check BS BID and PRN. DX. E11.8   OneTouch Verio test strip Generic drug: glucose blood USE TO CHECK BLOOD SUGAR TWICE DAILY AND AS NEEDED   PRESCRIPTION MEDICATION Inhale 1 application into the lungs at bedtime. "CPAP" MACHINE   simvastatin 20 MG tablet Commonly known as: ZOCOR TAKE 1 TABLET AT BEDTIME   sodium chloride 0.65 % nasal spray Commonly known as: OCEAN Place 1 spray into the nose daily as needed for congestion.   sucralfate 1 g tablet Commonly known as: CARAFATE TAKE 1 TABLET DAILY AS     NEEDED FOR STOMACH COATING   tamsulosin 0.4 MG Caps capsule Commonly known as: FLOMAX TAKE 2 CAPSULES DAILY AFTERSUPPER   Vitamin D 50 MCG (2000 UT) tablet Take 2,000 Units by mouth daily.   zolpidem 10 MG tablet Commonly known as: AMBIEN Take  1 tablet (10 mg total) by mouth at bedtime.       History (reviewed): Past Medical History:  Diagnosis Date   Allergy    Arthritis    Asthma    Benign prostatic hypertrophy    Carotid stenosis    Carotid US (07/2013):  Bilateral 1-39% ICA (f/u 2 yrs)   Cataract    mild   Chest pain    a. 01/2013 Cath: LM nl, LAD 25p, D1 25p, LCX nl, RCA 25p, PDA nl, RPL nl, EF 55%;  b. 03/2015 MV: EF 59%, no ischemia; c. 03/2016 MV: basal inf/mid inf defect, likely artifact, EF 48%, low risk.   Chronic Dyspnea on exertion    a. See cardiac eval under CAD heading;  b. 03/2015 Echo: EF 50-55%, gr1 DD, triv AI;  b. 03/2015 CTA Chest: No PE;  c. 05/2016 CPX: no significant cardi-pulmonary limitation noted. Suspect obesity contributing to exertional symptoms.   Diabetes mellitus without complication ([place]) 1443   Difficulty urinating    Diverticulitis    Diverticulosis    Exogenous obesity  GERD (gastroesophageal reflux disease)    moe like heart burn   H/O blepharoplasty 07/27/2013   H/O hiatal hernia    Hernia    Umbulical    History of adenomatous polyp of colon 1991   Hyperlipidemia    Laceration  01/11/2009    Deep laceration left index finger dorsally   Musculoskeletal pain    in the right shoulder - S/P ROTATOR CUFF REPAIR-BUT STILL HAS PAIN   Obstructive sleep apnea     CPAP    AUTO SET 6 TO 20 - USUALLY SETTLES OUT AT 10   Rectal bleeding    PT ATTRIBUTES TO HEMORRHOIDS   Sleep apnea    wears cpap    SUPRAVENTRICULAR TACHYCARDIA 08/01/2009   Qualifier: Diagnosis of  By: Lovette Cliche, CNA, Christy     Thyroiditis    Ulcer 2008   Walking pneumonia    Past Surgical History:  Procedure Laterality Date   COLON SURGERY  07/2012   COLONOSCOPY     for polyps   ELBOW BURSA SURGERY     Eyelid Surgery Bilateral 05/2013   INGUINAL HERNIA REPAIR     KIDNEY STONE SURGERY     KNEE SURGERY     meniscus right knee   LAPAROSCOPIC SIGMOID COLECTOMY  07/23/2012    Procedure: LAPAROSCOPIC SIGMOID COLECTOMY;  Surgeon: [name], MD;  Location: [facility];  Service: General;  Laterality: N/A;  Laparoscopic Sigmoid Colectomy   LEFT HEART CATHETERIZATION WITH CORONARY ANGIOGRAM N/A 02/22/2013   Procedure: LEFT HEART CATHETERIZATION WITH CORONARY ANGIOGRAM;  Surgeon: [name], MD;  Location: [facility] CATH LAB;  Service: Cardiovascular;  Laterality: N/A;   PROCTOSCOPY  07/23/2012   Procedure: PROCTOSCOPY;  Surgeon: [name], MD;  Location: [facility];  Service: General;  Laterality: N/A;  Rigid Proctoscopy   ROTATOR CUFF REPAIR     TONSILLECTOMY     TOTAL HIP ARTHROPLASTY Right 05/27/2018   Procedure: RIGHT TOTAL HIP ARTHROPLASTY ANTERIOR APPROACH;  Surgeon: [name], MD;  Location: [facility];  Service: Orthopedics;  Laterality: Right;  [id number]   UMBILICAL HERNIA REPAIR  07/23/2012   Procedure: HERNIA REPAIR UMBILICAL ADULT;  Surgeon: [name], MD;  Location: [facility];  Service: General;  Laterality: N/A;  Primary Umbilical Hernia Repair   Family History  Problem Relation Age of Onset   Stroke Mother    Heart disease Mother        Valvular heart disease, Rheumatic fever   Lung cancer Maternal Uncle    Diverticulitis Son    Diverticulitis Daughter    Colon cancer Neg Hx    Colon polyps Neg Hx    Esophageal cancer Neg Hx    Rectal cancer Neg Hx    Stomach cancer Neg Hx    Social History   Socioeconomic History   Marital status: Married    Spouse name: Alice   Number of children: 4   Years of education: 29   Highest education level: Some college, no degree  Occupational History   Occupation: Retired  Tobacco Use   Smoking status: Former Smoker    Types: Cigars    Quit date: 08/12/1999    Years since quitting: 21.0   Smokeless tobacco: Never Used   Tobacco comment: smoked cigars on and off for approx 4-5 yrs  Vaping Use   Vaping Use: Never used  Substance and Sexual Activity   Alcohol use: No   Drug use:  No   Sexual activity: Yes  Other Topics  Concern   Not on file  Social History Narrative   Lives at home with wife      [facility] (09/16/16):   Originally from [facility]. Previously served & lived in [place], [place], & [place]. 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 Cristian Moore also worked in [employer]. No pets currently. Remote bird exposure. No mold or hot tub exposure. Enjoys playing banjo as well as hunting. Cristian Moore also raises cattle. Has carpet in his bedroom. No feather bedding. No indoor plants.    Social Determinants of Health   Financial Resource Strain: Low Risk    Difficulty of Paying Living Expenses: Not hard at all  Food Insecurity: No Food Insecurity   Worried About Charity fundraiser in the Last Year: Never true   [place] in the Last Year: Never true  Transportation Needs: No Transportation Needs   Lack of Transportation (Medical): No   Lack of Transportation (Non-Medical): No  Physical Activity: Not on file  Stress: Not on file  Social Connections: Not on file    Activities of Daily Living In your present state of health, do you have any difficulty performing the following activities: 09/04/2020  Hearing? N  Vision? N  Difficulty concentrating or making decisions? N  Walking or climbing stairs? N  Dressing or bathing? N  Doing errands, shopping? N  Preparing Food and eating ? N  Using the Toilet? N  In the past six months, have you accidently leaked urine? N  Do you have problems with loss of bowel control? N  Managing your Medications? N  Managing your Finances? N  Housekeeping or managing your Housekeeping? N  Some recent data might be hidden    Patient Education/ Literacy Moore often do you need to have someone help you when you read instructions, pamphlets, or other written materials from your doctor or pharmacy?: 1 - Never What is the last grade level you completed in school?: 12th  grade  Exercise Current Exercise Habits: The patient does not participate in regular exercise at present, Exercise limited by: orthopedic condition(s)  Diet Patient reports consuming 3 meals a day and 1 snack(s) a day Patient reports that his primary diet is: Regular Patient reports that Cristian Moore does have regular access to food.   Depression Screen PHQ 2/9 Scores 09/04/2020 08/28/2020 02/24/2020 08/30/2019 08/23/2019 05/20/2019 02/04/2019  PHQ - 2 Score 0 0 0 0 0 0 0  PHQ- 9 Score - - - 1 - 0 0     Fall Risk Fall Risk  09/04/2020 08/28/2020 02/24/2020 08/30/2019 08/23/2019  Falls in the past year? 0 0 1 1 1   Number falls in past yr: - - 1 0 0  Injury with Fall? - - 0 0 0  Comment - - - - -  Risk for fall due to : - - History of fall(s) - History of fall(s)  Follow up Falls evaluation completed - Falls evaluation completed - Falls evaluation completed     Objective:  Cristian Moore seemed alert and oriented and Cristian Moore participated appropriately during our telephone visit.  Blood Pressure Weight BMI  BP Readings from Last 3 Encounters:  08/28/20 122/71  08/16/20 120/78  08/01/20 128/70   Wt Readings from Last 3 Encounters:  08/28/20 274 lb (124.3 kg)  08/16/20 275 lb (124.7 kg)  08/01/20 274 lb (124.3 kg)   BMI Readings from Last 1 Encounters:  08/28/20 36.15 kg/m    *Unable to obtain current vital signs, weight, and BMI due to telephone visit type  Hearing/Vision   [place] did not seem to have difficulty with hearing/understanding during the telephone conversation  Reports that Cristian Moore has had a formal eye exam by an eye care professional within the past year  Reports that Cristian Moore has not had a formal hearing evaluation within the past year *Unable to fully assess hearing and vision during telephone visit type  Cognitive Function: 6CIT Screen 09/04/2020 08/30/2019  What Year? 0 points 0 points  What month? 0 points 0 points  What time? 0 points 0 points  Count back from 20 0 points 0 points   Months in reverse 0 points 0 points  Repeat phrase 0 points 2 points  Total Score 0 2   (Normal:0-7, Significant for Dysfunction: >8)  Normal Cognitive Function Screening: Yes   Immunization & Health Maintenance Record Immunization History  Administered Date(s) Administered   Fluad Quad(high Dose 65+) 04/16/2019   Influenza Split 03/31/2012, 03/19/2013   Influenza, High Dose Seasonal PF 04/19/2009, 03/27/2012, 03/25/2014, 03/27/2015, 03/27/2016, 03/01/2017, 04/11/2017, 04/08/2018, 05/26/2018   Influenza,inj,Quad PF,6+ Mos 03/19/2013, 03/27/2015   Influenza,inj,quad, With Preservative 04/21/2018   Influenza-Unspecified 03/31/2006, 05/22/2007, 03/31/2008, 05/01/2008, 06/20/2010, 01/30/2011, 04/01/2011, 02/28/2014, 03/01/2016, 06/02/2020   Moderna Sars-Covid-2 Vaccination 07/28/2019, 08/25/2019, 05/10/2020   Pneumococcal Conjugate-13 07/08/2013   Pneumococcal Polysaccharide-23 03/31/2005, 07/01/2013, 02/05/2019   Pneumococcal-Unspecified 07/01/2004   Tdap 07/01/2009, 03/02/2011, 04/01/2011    Health Maintenance  Topic Date Due   OPHTHALMOLOGY EXAM  08/01/2017   HEMOGLOBIN A1C  02/25/2021   TETANUS/TDAP  03/31/2021   FOOT EXAM  08/28/2021   URINE MICROALBUMIN  08/28/2021   INFLUENZA VACCINE  Completed   COVID-19 Vaccine  Completed   PNA vac Low Risk Adult  Completed   HPV VACCINES  Aged Out       Assessment  This is a routine wellness examination for Cristian Moore.  Health Maintenance: Due or Overdue Health Maintenance Due  Topic Date Due   OPHTHALMOLOGY EXAM  08/01/2017    Cristian Moore does not need a referral for Community Assistance: Care Management:   no Social Work:    no Prescription Assistance:  no Nutrition/Diabetes Education:  no   Plan:  Personalized Goals Goals Addressed            This Visit's Progress    Patient Stated       09/04/2020 AWV Goal: Exercise for General Health   Patient will verbalize understanding  of the benefits of increased physical activity:  Exercising regularly is important. It will improve your overall fitness, flexibility, and endurance.  Regular exercise also will improve your overall health. It can help you control your weight, reduce stress, and improve your bone density.  Over the next year, patient will increase physical activity as tolerated with a goal of at least 150 minutes of moderate physical activity per week.   You can tell that you are exercising at a moderate intensity if your heart starts beating faster and you start breathing faster but can still hold a conversation.  Moderate-intensity exercise ideas include:  Walking 1 mile (1.6 km) in about 15 minutes  Biking  Hiking  Golfing  Dancing  Water aerobics  Patient will verbalize understanding of everyday activities that increase physical activity by providing examples like the following: ? Yard work, such as: ? Pushing a Conservation officer, nature ? Raking and bagging leaves ? Washing your car ? Pushing a  stroller ? Shoveling snow ? Gardening ? Washing windows or floors  Patient will be able to explain general safety guidelines for exercising:   Before you start a new exercise program, talk with your health care provider.  Do not exercise so much that you hurt yourself, feel dizzy, or get very short of breath.  Wear comfortable clothes and wear shoes with good support.  Drink plenty of water while you exercise to prevent dehydration or heat stroke.  Work out until your breathing and your heartbeat get faster.       Personalized Health Maintenance & Screening Recommendations  up to date  Lung Cancer Screening Recommended: yes (Low Dose CT Chest recommended if Age 90-80 years, 30 pack-year currently smoking OR have quit w/in past 15 years) Hepatitis C Screening recommended: no HIV Screening recommended: no  Advanced Directives: Written information was not prepared per patient's request.  Referrals  & Orders No orders of the defined types were placed in this encounter.   Follow-up Plan  Follow-up with [name], MD as planned   I have personally reviewed and noted the following in the patients chart:    Medical and social history  Use of alcohol, tobacco or illicit drugs   Current medications and supplements  Functional ability and status  Nutritional status  Physical activity  Advanced directives  List of other physicians  Hospitalizations, surgeries, and ER visits in previous 12 months  Vitals  Screenings to include cognitive, depression, and falls  Referrals and appointments  In addition, I have reviewed and discussed with Cristian Moore certain preventive protocols, quality metrics, and best practice recommendations. A written personalized care plan for preventive services as well as general preventive health recommendations is available and can be mailed to the patient at his request.      Felicity Coyer, LPN    01/03/1606  Patient declined after visit summary

## 2020-09-05 DIAGNOSIS — J449 Chronic obstructive pulmonary disease, unspecified: Secondary | ICD-10-CM | POA: Diagnosis not present

## 2020-09-05 DIAGNOSIS — H04129 Dry eye syndrome of unspecified lacrimal gland: Secondary | ICD-10-CM | POA: Diagnosis not present

## 2020-09-05 DIAGNOSIS — G8929 Other chronic pain: Secondary | ICD-10-CM | POA: Diagnosis not present

## 2020-09-05 DIAGNOSIS — I25119 Atherosclerotic heart disease of native coronary artery with unspecified angina pectoris: Secondary | ICD-10-CM | POA: Diagnosis not present

## 2020-09-05 DIAGNOSIS — E119 Type 2 diabetes mellitus without complications: Secondary | ICD-10-CM | POA: Diagnosis not present

## 2020-09-05 DIAGNOSIS — G4700 Insomnia, unspecified: Secondary | ICD-10-CM | POA: Diagnosis not present

## 2020-09-05 DIAGNOSIS — E785 Hyperlipidemia, unspecified: Secondary | ICD-10-CM | POA: Diagnosis not present

## 2020-09-05 DIAGNOSIS — I10 Essential (primary) hypertension: Secondary | ICD-10-CM | POA: Diagnosis not present

## 2020-09-05 DIAGNOSIS — R69 Illness, unspecified: Secondary | ICD-10-CM | POA: Diagnosis not present

## 2020-09-05 LAB — TOXASSURE SELECT 13 (MW), URINE

## 2020-09-19 ENCOUNTER — Other Ambulatory Visit: Payer: Self-pay

## 2020-09-19 ENCOUNTER — Ambulatory Visit (INDEPENDENT_AMBULATORY_CARE_PROVIDER_SITE_OTHER): Payer: Medicare HMO | Admitting: Nurse Practitioner

## 2020-09-19 ENCOUNTER — Encounter: Payer: Self-pay | Admitting: Nurse Practitioner

## 2020-09-19 VITALS — BP 134/80 | HR 62 | Temp 98.5°F | Resp 20 | Ht 73.0 in | Wt 276.0 lb

## 2020-09-19 DIAGNOSIS — B029 Zoster without complications: Secondary | ICD-10-CM

## 2020-09-19 MED ORDER — METHYLPREDNISOLONE ACETATE 80 MG/ML IJ SUSP: 80.0000 mg | Freq: Once | INTRAMUSCULAR | Status: DC

## 2020-09-19 MED ORDER — METHYLPREDNISOLONE ACETATE 40 MG/ML IJ SUSP
80.0000 mg | Freq: Once | INTRAMUSCULAR | Status: AC
Start: 2020-09-19 — End: 2020-09-19
  Administered 2020-09-19: 80 mg via INTRAMUSCULAR

## 2020-09-19 MED ORDER — VALACYCLOVIR HCL 1 G PO TABS
1000.0000 mg | ORAL_TABLET | Freq: Two times a day (BID) | ORAL | 0 refills | Status: DC
Start: 2020-09-19 — End: 2021-02-12

## 2020-09-19 NOTE — Progress Notes (Signed)
Subjective:    Patient ID: Cristian Moore, male    DOB: 1941/02/12, 80 y.o.   MRN: [id number]   Chief Complaint: Hip pain radiating down leg (Went dancing a couple weeks ago and felt something pop/Also has a rash on side of leg//)   HPI Pt reports today with c/o right hip pain. He has a history of chronic back pain. He was dancing last Friday and felt a "pop" in his left leg above his knee and it radiates left laterally to left buttocks. Sometimes his pain radiates down to his ankle as well. His pain worsens through the day. He has taken tylenol with no relief. He has taken hydrocodone (1/2 tab) for pain 7/10 pain or worse.   Secondly he has a band of vesicles 1.5 wks ago. It first presented as a burning sensation in the area, then the "bumps appeared after that. He [place] unsure how many days it took for the rash to appear. He has put some hydrocortisone cream with a little relief. The rash does not itch, only feels like a pain and a burn. The rash was present before his injury. He had a small rash under is pantus about 3 weeks ago which has resolved.    Review of Systems  Respiratory: Positive for shortness of breath. Negative for cough and wheezing.   Cardiovascular: Negative for chest pain, palpitations and leg swelling.  Musculoskeletal: Positive for back pain and myalgias.  Neurological: Negative for dizziness, facial asymmetry and light-headedness.  Psychiatric/Behavioral: Negative for confusion. The patient is not nervous/anxious.        Objective:   Physical Exam Constitutional:      Appearance: He is obese.  Cardiovascular:     Rate and Rhythm: Normal rate and regular rhythm.     Pulses: Normal pulses.     Heart sounds: Normal heart sounds.  Pulmonary:     Effort: Pulmonary effort is normal.     Breath sounds: Normal breath sounds.  Abdominal:     General: Bowel sounds are normal.  Musculoskeletal:        General: Tenderness present.     Right hip: Decreased range of  motion.  Skin:    General: Skin is warm and dry.     Capillary Refill: Capillary refill takes less than 2 seconds.     Comments: Vesicular patchy area on right lateral to posterior upper thigh.   Neurological:     Mental Status: He is alert and oriented to person, place, and time.  Psychiatric:        Behavior: Behavior normal.     Vitals:   09/19/20 1450  BP: 134/80  Pulse: 62  Resp: 20  Temp: 98.5 F (36.9 C)  SpO2: 97%      Assessment & Plan:   Cristian Moore comes in today with chief complaint of Hip pain radiating down leg (Went dancing a couple weeks ago and felt something pop/Also has a rash on side of leg//)   Diagnosis and orders addressed:  1. Herpes zoster without complication Begin valcyclovir. Methylprednisolone inj today. Can take gabapentin up to 3 times a day for his nerve pain. Can also take his hydrocodone for pain PRN. If he does not need it, he does not have to take it.   - valACYclovir (VALTREX) 1000 MG tablet; Take 1 tablet (1,000 mg total) by mouth 2 (two) times daily.  Dispense: 21 tablet; Refill: 0 - methylPREDNISolone acetate (DEPO-MEDROL) injection 80 mg   Labs  pending Health Maintenance reviewed Diet and exercise encouraged  Follow up plan: PRN  Dollene Primrose, RN, BSN, FNP-Student  Mary-Margaret Hassell Done, FNP

## 2020-10-11 DIAGNOSIS — Z23 Encounter for immunization: Secondary | ICD-10-CM | POA: Diagnosis not present

## 2020-10-19 ENCOUNTER — Other Ambulatory Visit: Payer: Self-pay | Admitting: Family Medicine

## 2020-10-19 DIAGNOSIS — N400 Enlarged prostate without lower urinary tract symptoms: Secondary | ICD-10-CM

## 2020-10-19 DIAGNOSIS — K219 Gastro-esophageal reflux disease without esophagitis: Secondary | ICD-10-CM

## 2020-10-19 DIAGNOSIS — E118 Type 2 diabetes mellitus with unspecified complications: Secondary | ICD-10-CM

## 2020-11-10 ENCOUNTER — Other Ambulatory Visit: Payer: Self-pay | Admitting: *Deleted

## 2020-11-10 MED ORDER — MONTELUKAST SODIUM 10 MG PO TABS: 1.0000 | ORAL_TABLET | Freq: Every day | ORAL | 1 refills | Status: DC

## 2020-11-28 ENCOUNTER — Other Ambulatory Visit: Payer: Self-pay | Admitting: Family Medicine

## 2020-11-30 ENCOUNTER — Other Ambulatory Visit: Payer: Self-pay | Admitting: Family Medicine

## 2021-02-06 DIAGNOSIS — M25511 Pain in right shoulder: Secondary | ICD-10-CM | POA: Diagnosis not present

## 2021-02-06 DIAGNOSIS — M25512 Pain in left shoulder: Secondary | ICD-10-CM | POA: Diagnosis not present

## 2021-02-06 DIAGNOSIS — M170 Bilateral primary osteoarthritis of knee: Secondary | ICD-10-CM | POA: Diagnosis not present

## 2021-02-11 ENCOUNTER — Emergency Department (HOSPITAL_COMMUNITY): Payer: No Typology Code available for payment source

## 2021-02-11 ENCOUNTER — Inpatient Hospital Stay (HOSPITAL_COMMUNITY)
Admission: EM | Admit: 2021-02-11 | Discharge: 2021-02-12 | DRG: 062 | Disposition: A | Discharge status: Home / Self Care | Payer: No Typology Code available for payment source | Attending: Neurology | Admitting: Neurology

## 2021-02-11 ENCOUNTER — Inpatient Hospital Stay (HOSPITAL_COMMUNITY): Payer: No Typology Code available for payment source

## 2021-02-11 ENCOUNTER — Other Ambulatory Visit: Payer: Self-pay

## 2021-02-11 ENCOUNTER — Encounter (HOSPITAL_COMMUNITY): Payer: Self-pay | Admitting: Emergency Medicine

## 2021-02-11 DIAGNOSIS — R29702 NIHSS score 2: Secondary | ICD-10-CM | POA: Diagnosis present

## 2021-02-11 DIAGNOSIS — I2510 Atherosclerotic heart disease of native coronary artery without angina pectoris: Secondary | ICD-10-CM

## 2021-02-11 DIAGNOSIS — R29701 NIHSS score 1: Secondary | ICD-10-CM | POA: Diagnosis not present

## 2021-02-11 DIAGNOSIS — F40240 Claustrophobia: Secondary | ICD-10-CM | POA: Diagnosis present

## 2021-02-11 DIAGNOSIS — G4733 Obstructive sleep apnea (adult) (pediatric): Secondary | ICD-10-CM | POA: Diagnosis present

## 2021-02-11 DIAGNOSIS — E785 Hyperlipidemia, unspecified: Secondary | ICD-10-CM | POA: Diagnosis present

## 2021-02-11 DIAGNOSIS — Z87442 Personal history of urinary calculi: Secondary | ICD-10-CM

## 2021-02-11 DIAGNOSIS — R471 Dysarthria and anarthria: Secondary | ICD-10-CM | POA: Diagnosis present

## 2021-02-11 DIAGNOSIS — K219 Gastro-esophageal reflux disease without esophagitis: Secondary | ICD-10-CM | POA: Diagnosis present

## 2021-02-11 DIAGNOSIS — E1165 Type 2 diabetes mellitus with hyperglycemia: Secondary | ICD-10-CM | POA: Diagnosis present

## 2021-02-11 DIAGNOSIS — E118 Type 2 diabetes mellitus with unspecified complications: Secondary | ICD-10-CM | POA: Diagnosis present

## 2021-02-11 DIAGNOSIS — I6381 Other cerebral infarction due to occlusion or stenosis of small artery: Principal | ICD-10-CM | POA: Diagnosis present

## 2021-02-11 DIAGNOSIS — N400 Enlarged prostate without lower urinary tract symptoms: Secondary | ICD-10-CM | POA: Diagnosis present

## 2021-02-11 DIAGNOSIS — I6330 Cerebral infarction due to thrombosis of unspecified cerebral artery: Secondary | ICD-10-CM | POA: Diagnosis not present

## 2021-02-11 DIAGNOSIS — R9431 Abnormal electrocardiogram [ECG] [EKG]: Secondary | ICD-10-CM | POA: Diagnosis not present

## 2021-02-11 DIAGNOSIS — Z87891 Personal history of nicotine dependence: Secondary | ICD-10-CM

## 2021-02-11 DIAGNOSIS — I639 Cerebral infarction, unspecified: Secondary | ICD-10-CM

## 2021-02-11 DIAGNOSIS — R059 Cough, unspecified: Secondary | ICD-10-CM

## 2021-02-11 DIAGNOSIS — Z8249 Family history of ischemic heart disease and other diseases of the circulatory system: Secondary | ICD-10-CM

## 2021-02-11 DIAGNOSIS — E669 Obesity, unspecified: Secondary | ICD-10-CM | POA: Diagnosis present

## 2021-02-11 DIAGNOSIS — I10 Essential (primary) hypertension: Secondary | ICD-10-CM | POA: Diagnosis present

## 2021-02-11 DIAGNOSIS — Z823 Family history of stroke: Secondary | ICD-10-CM | POA: Diagnosis not present

## 2021-02-11 DIAGNOSIS — R29810 Facial weakness: Secondary | ICD-10-CM | POA: Diagnosis present

## 2021-02-11 DIAGNOSIS — Z79899 Other long term (current) drug therapy: Secondary | ICD-10-CM

## 2021-02-11 DIAGNOSIS — Z20822 Contact with and (suspected) exposure to covid-19: Secondary | ICD-10-CM | POA: Diagnosis present

## 2021-02-11 DIAGNOSIS — E039 Hypothyroidism, unspecified: Secondary | ICD-10-CM | POA: Diagnosis present

## 2021-02-11 DIAGNOSIS — Z6837 Body mass index (BMI) 37.0-37.9, adult: Secondary | ICD-10-CM

## 2021-02-11 DIAGNOSIS — Z7984 Long term (current) use of oral hypoglycemic drugs: Secondary | ICD-10-CM

## 2021-02-11 DIAGNOSIS — R531 Weakness: Secondary | ICD-10-CM

## 2021-02-11 DIAGNOSIS — G8191 Hemiplegia, unspecified affecting right dominant side: Secondary | ICD-10-CM | POA: Diagnosis present

## 2021-02-11 DIAGNOSIS — D72829 Elevated white blood cell count, unspecified: Secondary | ICD-10-CM | POA: Diagnosis present

## 2021-02-11 DIAGNOSIS — Z886 Allergy status to analgesic agent status: Secondary | ICD-10-CM

## 2021-02-11 DIAGNOSIS — Z880 Allergy status to penicillin: Secondary | ICD-10-CM

## 2021-02-11 DIAGNOSIS — Z7982 Long term (current) use of aspirin: Secondary | ICD-10-CM

## 2021-02-11 LAB — COMPREHENSIVE METABOLIC PANEL
ALT: 25 U/L (ref 0–44)
AST: 19 U/L (ref 15–41)
Albumin: 3.8 g/dL (ref 3.5–5.0)
Alkaline Phosphatase: 30 U/L — ABNORMAL LOW (ref 38–126)
Anion gap: 7 (ref 5–15)
BUN: 25 mg/dL — ABNORMAL HIGH (ref 8–23)
CO2: 25 mmol/L (ref 22–32)
Calcium: 9.7 mg/dL (ref 8.9–10.3)
Chloride: 105 mmol/L (ref 98–111)
Creatinine, Ser: 1.24 mg/dL (ref 0.61–1.24)
GFR, Estimated: 59 mL/min — ABNORMAL LOW (ref >60)
Glucose, Bld: 121 mg/dL — ABNORMAL HIGH (ref 70–99)
Potassium: 5 mmol/L (ref 3.5–5.1)
Sodium: 137 mmol/L (ref 135–145)
Total Bilirubin: 0.6 mg/dL (ref 0.3–1.2)
Total Protein: 6.4 g/dL — ABNORMAL LOW (ref 6.5–8.1)

## 2021-02-11 LAB — DIFFERENTIAL
Abs Immature Granulocytes: 0.26 10*3/uL — ABNORMAL HIGH (ref 0.00–0.07)
Basophils Absolute: 0.1 10*3/uL (ref 0.0–0.1)
Basophils Relative: 0 %
Eosinophils Absolute: 0 10*3/uL (ref 0.0–0.5)
Eosinophils Relative: 0 %
Immature Granulocytes: 2 %
Lymphocytes Relative: 13 %
Lymphs Abs: 1.9 10*3/uL (ref 0.7–4.0)
Monocytes Absolute: 1.5 10*3/uL — ABNORMAL HIGH (ref 0.1–1.0)
Monocytes Relative: 10 %
Neutro Abs: 11.2 10*3/uL — ABNORMAL HIGH (ref 1.7–7.7)
Neutrophils Relative %: 75 %

## 2021-02-11 LAB — ETHANOL: Alcohol, Ethyl (B): <10 mg/dL (ref <10)

## 2021-02-11 LAB — I-STAT CHEM 8, ED
BUN: 26 mg/dL — ABNORMAL HIGH (ref 8–23)
Calcium, Ion: 1.12 mmol/L — ABNORMAL LOW (ref 1.15–1.40)
Chloride: 104 mmol/L (ref 98–111)
Creatinine, Ser: 1.2 mg/dL (ref 0.61–1.24)
Glucose, Bld: 117 mg/dL — ABNORMAL HIGH (ref 70–99)
HCT: 42 % (ref 39.0–52.0)
Hemoglobin: 14.3 g/dL (ref 13.0–17.0)
Potassium: 4.5 mmol/L (ref 3.5–5.1)
Sodium: 137 mmol/L (ref 135–145)
TCO2: 24 mmol/L (ref 22–32)

## 2021-02-11 LAB — PROTIME-INR
INR: 1 (ref 0.8–1.2)
Prothrombin Time: 12.8 seconds (ref 11.4–15.2)

## 2021-02-11 LAB — APTT: aPTT: 22 seconds — ABNORMAL LOW (ref 24–36)

## 2021-02-11 LAB — CBC
HCT: 44.7 % (ref 39.0–52.0)
Hemoglobin: 14.6 g/dL (ref 13.0–17.0)
MCH: 33 pg (ref 26.0–34.0)
MCHC: 32.7 g/dL (ref 30.0–36.0)
MCV: 101.1 fL — ABNORMAL HIGH (ref 80.0–100.0)
Platelets: 306 10*3/uL (ref 150–400)
RBC: 4.42 MIL/uL (ref 4.22–5.81)
RDW: 13.3 % (ref 11.5–15.5)
WBC: 14.9 10*3/uL — ABNORMAL HIGH (ref 4.0–10.5)
nRBC: 0 % (ref 0.0–0.2)

## 2021-02-11 LAB — CBG MONITORING, ED: Glucose-Capillary: 118 mg/dL — ABNORMAL HIGH (ref 70–99)

## 2021-02-11 LAB — RESP PANEL BY RT-PCR (FLU A&B, COVID) ARPGX2
Influenza A by PCR: NEGATIVE
Influenza B by PCR: NEGATIVE
SARS Coronavirus 2 by RT PCR: NEGATIVE

## 2021-02-11 LAB — MRSA NEXT GEN BY PCR, NASAL: MRSA by PCR Next Gen: NOT DETECTED

## 2021-02-11 MED ORDER — ALTEPLASE (STROKE) FULL DOSE INFUSION
90.0000 mg | Freq: Once | INTRAVENOUS | Status: AC
  Administered 2021-02-11: 90 mg via INTRAVENOUS

## 2021-02-11 MED ORDER — CHLORHEXIDINE GLUCONATE CLOTH 2 % EX PADS
6.0000 | MEDICATED_PAD | Freq: Every day | CUTANEOUS | Status: DC
  Administered 2021-02-11: 6 via TOPICAL

## 2021-02-11 MED ORDER — IOHEXOL 350 MG/ML SOLN
75.0000 mL | Freq: Once | INTRAVENOUS | Status: AC | PRN
  Administered 2021-02-11: 75 mL via INTRAVENOUS

## 2021-02-11 MED ORDER — PANTOPRAZOLE SODIUM 40 MG IV SOLR
40.0000 mg | Freq: Every day | INTRAVENOUS | Status: DC
  Administered 2021-02-11: 40 mg via INTRAVENOUS
  Filled 2021-02-11: qty 40

## 2021-02-11 MED ORDER — SENNOSIDES-DOCUSATE SODIUM 8.6-50 MG PO TABS: 1.0000 | ORAL_TABLET | Freq: Every evening | ORAL | Status: DC | PRN

## 2021-02-11 MED ORDER — SODIUM CHLORIDE 0.9 % IV SOLN: 50.0000 mL | Freq: Once | INTRAVENOUS | Status: DC

## 2021-02-11 MED ORDER — INSULIN ASPART 100 UNIT/ML IJ SOLN
0.0000 [IU] | Freq: Three times a day (TID) | INTRAMUSCULAR | Status: DC
  Administered 2021-02-12: 3 [IU] via SUBCUTANEOUS

## 2021-02-11 MED ORDER — STROKE: EARLY STAGES OF RECOVERY BOOK
Freq: Once | Status: AC
  Filled 2021-02-11: qty 1

## 2021-02-11 MED ORDER — SODIUM CHLORIDE 0.9 % IV SOLN: INTRAVENOUS | Status: DC

## 2021-02-11 NOTE — Progress Notes (Signed)
PHARMACIST CODE STROKE RESPONSE  Notified to mix tPA at [place] by Dr. Rory Percy Delivered tPA to RN at [phone]  tPA dose = '9mg'$  bolus over 1 minute followed by 81 mg for a total dose of 90 mg over 1 hour  Issues/delays encountered (if applicable):  Patient from triage Code stroke page delay in reaching device - had to manually notify neurology  Lorelei Pont, PharmD, BCPS 02/11/2021 6:59 PM ED Clinical Pharmacist -  [phone]

## 2021-02-11 NOTE — ED Triage Notes (Signed)
Pt c/o sudden onset of right-sided numbness, facial droop and mild dysarthria beginning approx 1500. Pt A&O4, states deficits have since improved "a little," but continued R sided numbness/tingling, "like it's asleep." Code Stroke activated by Delia Chimes

## 2021-02-11 NOTE — Progress Notes (Signed)
Patient arrived to 4N17 with shoes, hat, black cane, wallet, shirt and jean pants at bedside.

## 2021-02-11 NOTE — ED Triage Notes (Addendum)
Pt arrived via POV.  Reports numbness and weakness to R arm, leg, and face that started at 3pm when he went to stand up and had difficulty using R leg.  States weakness has improved but continues to have R sided numbness.  No arm drift.  VAN negative.  NS notified to activate Code Stroke.  Pt straight to Green zone for airway clearance by Dr. Pearline Cables @ [phone].  Labs drawn at bridge and pt to CT with Jinny Blossom, RN.

## 2021-02-11 NOTE — H&P (Addendum)
H&P  CC: right sided numbness, right facial droop  History is obtained from:pt, chart  HPI: Cristian Moore is a 80 y.o. male PMH of CAD, HTN, HLD, DM, presents for evaluation of sudden onset right sided numbness. . Other past medical history includes mild carotid stenosis, sleep apnea, chronic cough, gastroesophageal reflux disease. Patient reports that he was in his usual state of health this afternoon when right around 3 PM suddenly started noticing some right-sided leg numbness that he describes it as a feeling of when his leg goes to sleep.  This symptom did not go away.  He took a nap and thought that this would get better.  When he woke up he also had the same numbness along with some tingling in his right arm as well as on the right side of his face.  He did not report any weakness.  There was question of right facial droop at that time as well.  He came to the emergency room, was seen at triage, code stroke activated. I was informed of this patient being a code stroke by our very astute pharmacist and I saw the patient even before the code stroke went out. He had only sensory symptoms, no facial droop, no motor symptoms, no cranial nerve involvement. I discussed risks and benefits of IV tPA-he agreed and we proceeded with IV tPA. Review of systems: Reports chronic cough for which he is on steroids.  Has received both doses of Moderna COVID-vaccine and 2 COVID boosters-Moderna-cannot remember the dates.  LKW: 3 PM tpa given?:  Yes Premorbid modified Rankin scale (mRS): 0   ROS: Full ROS was performed and is negative except as noted in the HPI.  Past Medical History:  Diagnosis Date   Allergy    Arthritis    Asthma    Benign prostatic hypertrophy    Carotid stenosis    Carotid US (07/2013):  Bilateral 1-39% ICA (f/u 2 yrs)   Cataract    mild   Chest pain    a. 01/2013 Cath: LM nl, LAD 25p, D1 25p, LCX nl, RCA 25p, PDA nl, RPL nl, EF 55%;  b. 03/2015 MV: EF 59%, no ischemia; c.  03/2016 MV: basal inf/mid inf defect, likely artifact, EF 48%, low risk.   Chronic Dyspnea on exertion    a. See cardiac eval under CAD heading;  b. 03/2015 Echo: EF 50-55%, gr1 DD, triv AI;  b. 03/2015 CTA Chest: No PE;  c. 05/2016 CPX: no significant cardi-pulmonary limitation noted. Suspect obesity contributing to exertional symptoms.   Diabetes mellitus without complication ([place]) [id number]   Difficulty urinating    Diverticulitis    Diverticulosis    Exogenous obesity    GERD (gastroesophageal reflux disease)    moe like heart burn   H/O blepharoplasty 07/27/2013   H/O hiatal hernia    Hernia    Umbulical    History of adenomatous polyp of colon 1991   Hyperlipidemia    Laceration  01/11/2009    Deep laceration left index finger dorsally   Musculoskeletal pain    in the right shoulder - S/P ROTATOR CUFF REPAIR-BUT STILL HAS PAIN   Obstructive sleep apnea     CPAP    AUTO SET 6 TO 20 - USUALLY SETTLES OUT AT 10   Rectal bleeding    PT ATTRIBUTES TO HEMORRHOIDS   Sleep apnea    wears cpap    SUPRAVENTRICULAR TACHYCARDIA 08/01/2009   Qualifier: Diagnosis of  By: Lovette Cliche, CNA, Alyse Low  Thyroiditis    Ulcer 2008   Walking pneumonia     Family History  Problem Relation Age of Onset   Stroke Mother    Heart disease Mother        Valvular heart disease, Rheumatic fever   Lung cancer Maternal Uncle    Diverticulitis Son    Diverticulitis Daughter    Colon cancer Neg Hx    Colon polyps Neg Hx    Esophageal cancer Neg Hx    Rectal cancer Neg Hx    Stomach cancer Neg Hx      Social History:   reports that he quit smoking about 21 years ago. His smoking use included cigars. He has never used smokeless tobacco. He reports that he does not drink alcohol and does not use drugs.  Medications  Current Facility-Administered Medications:     stroke: mapping our early stages of recovery book, , Does not apply, Once, Amie Portland, MD   alteplase (ACTIVASE) 1 mg/mL infusion SOLN 90 mg, 90  mg, Intravenous, Once, Last Rate: 90 mL/hr at 02/11/21 1852, 90 mg at 02/11/21 1852 **FOLLOWED BY** 0.9 %  sodium chloride infusion, 50 mL, Intravenous, Once, Amie Portland, MD   0.9 %  sodium chloride infusion, , Intravenous, Continuous, Amie Portland, MD   Derrill Memo ON 02/12/2021] insulin aspart (novoLOG) injection 0-20 Units, 0-20 Units, Subcutaneous, TID WC, Amie Portland, MD   pantoprazole (PROTONIX) injection 40 mg, 40 mg, Intravenous, QHS, Amie Portland, MD   senna-docusate (Senokot-S) tablet 1 tablet, 1 tablet, Oral, QHS PRN, Amie Portland, MD  Current Outpatient Medications:    acetaminophen (TYLENOL) 500 MG tablet, Take 1,000 mg by mouth every 6 (six) hours as needed for moderate pain., Disp: , Rfl:    albuterol (VENTOLIN HFA) 108 (90 Base) MCG/ACT inhaler, Inhale 2 puffs into the lungs every 6 (six) hours as needed for wheezing or shortness of breath., Disp: 18 g, Rfl: 3   aspirin EC 81 MG tablet, Take 81 mg by mouth daily. Swallow whole., Disp: , Rfl:    Cholecalciferol (VITAMIN D) 50 MCG (2000 UT) tablet, Take 2,000 Units by mouth daily., Disp: , Rfl:    escitalopram (LEXAPRO) 20 MG tablet, TAKE 1/2 TABLET ONCE DAILY, Disp: 45 tablet, Rfl: 0   famotidine (PEPCID) 10 MG tablet, Take 10 mg by mouth 2 (two) times daily., Disp: , Rfl:    fluticasone (FLONASE) 50 MCG/ACT nasal spray, USE 2 SPRAYS IN EACH       NOSTRIL DAILY, Disp: 48 g, Rfl: 2   HYDROcodone-acetaminophen (NORCO/VICODIN) 5-325 MG tablet, Will take 1/2 tablet as needed, Disp: , Rfl:    hydroxypropyl methylcellulose / hypromellose (ISOPTO TEARS / GONIOVISC) 2.5 % ophthalmic solution, Place 1 drop into both eyes daily as needed for dry eyes., Disp: , Rfl:    Krill Oil 350 MG CAPS, Take 350 mg by mouth 2 (two) times daily., Disp: , Rfl:    metFORMIN (GLUCOPHAGE) 500 MG tablet, TAKE 1 TABLET DAILY WITH   BREAKFAST, Disp: 90 tablet, Rfl: 1   metoprolol tartrate (LOPRESSOR) 50 MG tablet, Take 1 tablet (50 mg total) by mouth 2 (two)  times daily., Disp: 180 tablet, Rfl: 1   montelukast (SINGULAIR) 10 MG tablet, Take 1 tablet (10 mg total) by mouth at bedtime., Disp: 90 tablet, Rfl: 1   Multiple Vitamins-Minerals (CENTRUM SILVER PO), Take 1 tablet by mouth daily., Disp: , Rfl:    nitroGLYCERIN (NITROSTAT) 0.4 MG SL tablet, DISSOLVE ONE TABLET UNDER THE TONGUE EVERY 5  MINUTES AS NEEDED FOR CHEST PAIN.  DO NOT EXCEED A TOTAL OF 3 DOSES IN 15 MINUTES (Patient taking differently: DISSOLVE ONE TABLET UNDER THE TONGUE EVERY 5 MINUTES AS NEEDED FOR CHEST PAIN.  DO NOT EXCEED A TOTAL OF 3 DOSES IN 15 MINUTES), Disp: 25 tablet, Rfl: 11   ONETOUCH DELICA LANCETS [id number] MISC, Check BS BID and PRN. DX. E11.8, Disp: 100 each, Rfl: 11   ONETOUCH VERIO test strip, USE TO CHECK BLOOD SUGAR TWICE DAILY AND AS NEEDED, Disp: 100 each, Rfl: 11   PRESCRIPTION MEDICATION, Inhale 1 application into the lungs at bedtime. "CPAP" MACHINE, Disp: , Rfl:    simvastatin (ZOCOR) 20 MG tablet, TAKE 1 TABLET AT BEDTIME, Disp: 90 tablet, Rfl: 3   sodium chloride (OCEAN) 0.65 % nasal spray, Place 1 spray into the nose daily as needed for congestion. , Disp: , Rfl:    Spacer/Aero-Holding Chambers (AEROCHAMBER MV) inhaler, Use as instructed, Disp: 1 each, Rfl: 0   sucralfate (CARAFATE) 1 g tablet, TAKE 1 TABLET DAILY AS     NEEDED FOR STOMACH COATING, Disp: 90 tablet, Rfl: 1   tamsulosin (FLOMAX) 0.4 MG CAPS capsule, TAKE 2 CAPSULES DAILY AFTERSUPPER, Disp: 180 capsule, Rfl: 1   valACYclovir (VALTREX) 1000 MG tablet, Take 1 tablet (1,000 mg total) by mouth 2 (two) times daily., Disp: 21 tablet, Rfl: 0   zolpidem (AMBIEN) 10 MG tablet, Take 1 tablet (10 mg total) by mouth at bedtime., Disp: 90 tablet, Rfl: 1   Exam: Current vital signs: BP (!) 99/57   Pulse (!) 48   Temp 98.4 F (36.9 C)   Resp 14   Wt 128 kg   SpO2 99%   BMI 37.23 kg/m  Vital signs in last 24 hours: Temp:  [98.4 F (36.9 C)] 98.4 F (36.9 C) (08/14 1906) Pulse Rate:  [48] 48 (08/14  1906) Resp:  [14] 14 (08/14 1906) BP: (99)/(57) 99/57 (08/14 1906) SpO2:  [99 %] 99 % (08/14 1906) Weight:  LB:[id number] kg] 128 kg (08/14 1838)  General: Awake alert in no distress Ht: Normocephalic/atraumatic Lungs: Clear Cardiovascular: Regular rhythm Abdomen nondistended nontender Extremities warm well perfused Neurological exam Alert awake oriented x3 Question mild dysarthria No aphasia Cranial nerves II to XII intact Motor exam with no deficits-5/5 in all fours Sensory exam with diminished right hemibody light touch sensation in the face arm and leg. Coordination: With no dysmetria Gait testing deferred NIH stroke scale-2 (1 for sensory, 1 for dysarthria)   Labs I have reviewed labs in epic and the results pertinent to this consultation are:   CBC    Component Value Date/Time   WBC 14.9 (H) 02/11/2021 1839   RBC 4.42 02/11/2021 1839   HGB 14.3 02/11/2021 1845   HGB 14.9 08/28/2020 0811   HCT 42.0 02/11/2021 1845   HCT 45.3 08/28/2020 0811   PLT 306 02/11/2021 1839   PLT 292 08/28/2020 0811   MCV 101.1 (H) 02/11/2021 1839   MCV 97 08/28/2020 0811   MCH 33.0 02/11/2021 1839   MCHC 32.7 02/11/2021 1839   RDW 13.3 02/11/2021 1839   RDW 12.5 08/28/2020 0811   LYMPHSABS 1.9 02/11/2021 1839   LYMPHSABS 2.7 08/28/2020 0811   MONOABS 1.5 (H) 02/11/2021 1839   EOSABS 0.0 02/11/2021 1839   EOSABS 0.2 08/28/2020 0811   BASOSABS 0.1 02/11/2021 1839   BASOSABS 0.1 08/28/2020 0811    CMP     Component Value Date/Time   NA 137 02/11/2021 1845  NA 142 08/28/2020 0811   K 4.5 02/11/2021 1845   CL 104 02/11/2021 1845   CO2 25 02/11/2021 1839   GLUCOSE 117 (H) 02/11/2021 1845   BUN 26 (H) 02/11/2021 1845   BUN 15 08/28/2020 0811   CREATININE 1.20 02/11/2021 1845   CALCIUM 9.7 02/11/2021 1839   PROT 6.4 (L) 02/11/2021 1839   PROT 6.5 08/28/2020 0811   ALBUMIN 3.8 02/11/2021 1839   ALBUMIN 4.5 08/28/2020 0811   AST 19 02/11/2021 1839   ALT 25 02/11/2021 1839    ALKPHOS 30 (L) 02/11/2021 1839   BILITOT 0.6 02/11/2021 1839   BILITOT 0.5 08/28/2020 0811   GFRNONAA 59 (L) 02/11/2021 1839   GFRAA 68 02/24/2020 0809    Lipid Panel     Component Value Date/Time   CHOL 159 08/28/2020 0811   TRIG 188 (H) 08/28/2020 0811   TRIG 111 01/22/2017 1027   HDL 47 08/28/2020 0811   HDL 39 (L) 01/22/2017 1027   CHOLHDL 3.4 08/28/2020 0811   CHOLHDL 3.1 02/21/2013 0212   VLDL 21 02/21/2013 0212   LDLCALC 80 08/28/2020 0811   LDLCALC 89 01/31/2014 1010     Imaging I have reviewed the images obtained:  CT-head-no acute changes.  Aspects 10.  Reviewed by me personally.  No bleed.  CTA H+N-no emergent LVO-read pending  Assessment:  80 year old man with above past medical history presenting with sudden onset of right-sided numbness, facial droop and mild dysarthria. Low stroke scale but likely small vessel stroke given risk factors.  An embolic process cannot be ruled out. Discussed risk and benefits of IV tPA that she agreed to proceed. Imaging reviewed by me personally-no evidence of bleed on the CAT scan.  Confirmed by radiology as well. Will be admitted to the neuro ICU for post tPA care and stroke work-up.  Plan:  Acute Ischemic Stroke  Acuity: Acute Current Suspected Etiology: embolic v small vessel Continue Evaluation:  -Admit to: NICU -Hold Aspirin until 24 hour post tPA neuroimaging is stable and without evidence of bleeding -Blood pressure control, goal of SYS <180 -MRI/ECHO/A1C/Lipid panel. -Hyperglycemia management per SSI to maintain glucose 140-'180mg'$ /dL. -PT/OT/ST therapies and recommendations when able  CNS -Close neuro monitoring -May be an OPTIMIST candidate - Dr. Curly Shores to follow in 2h to assess appropriateness.  Dysarthria Dysphagia following cerebral infarction  -NPO until cleared by speech -ST  Hemiplegia and hemiparesis following cerebral infarction affecting right dominant side -PT/OT -PM&R consult  RESP No  active issues Monitor clinically on RA   CV Essential (primary) hypertension -Aggressive BP control, goal SBP < 180 -Use labetalol/hydralazine PRN. If more need, use Cleviprex -TTE  Hyperlipidemia, unspecified  - Statin for goal LDL < 70  HEME No active issues -Monitor -transfuse for hgb < 7  ENDO Type 2 diabetes mellitus with hyperglycemia  -SSI -goal HgbA1c < 7  GI/GU No active issues. Gentle hydration  Fluid/Electrolyte Disorders No active issues  ID Leukocytosis -CXR, covid test and UA  Nutrition E66.9 Obesity  -diet consult  Prophylaxis DVT:  scd GI: ppi Bowel: doc/senna  Diet: NPO until cleared by swallow eval  Code Status: Full Code   D/W Dr Pearline Cables in [place] PRESENT ON ADMISSION: Acute ischemic stroke, right hemiparesis/hemisensory loss, leukocytosis-concern for COVID versus UTI versus pneumonia, diabetes with hyperglycemia   -- Amie Portland, MD Neurologist [facility] Pager: [phone]   [place] Performed by: Amie Portland, MD Total critical care time: 35 minutes Critical care  time was exclusive of separately billable procedures and treating other patients and/or supervising APPs/Residents/Students Critical care was necessary to treat or prevent imminent or life-threatening deterioration due to AIS, IV thrombolysis  This patient is critically ill and at significant risk for neurological worsening and/or death and care requires constant monitoring. Critical care was time spent personally by me on the following activities: development of treatment plan with patient and/or surrogate as well as nursing, discussions with consultants, evaluation of patient's response to treatment, examination of patient, obtaining history from patient or surrogate, ordering and performing treatments and interventions, ordering and review of laboratory studies, ordering and review of radiographic studies, pulse oximetry,  re-evaluation of patient's condition, participation in multidisciplinary rounds and medical decision making of high complexity in the care of this patient.

## 2021-02-11 NOTE — ED Provider Notes (Signed)
[facility]UR[facility]/SURGICAL ICU Provider Note   CSN: [id number] Arrival date & time: 02/11/21  1823  An emergency department physician performed an initial assessment on this suspected stroke patient at 1831.  History Chief Complaint  Patient presents with   Code Stroke    Cristian Moore is a 80 y.o. male.  This is 79 yo male with history as below presenting to ED for stroke like symptoms. Sudden onset right sided numbness was reported. Right leg weakness, difficulty moving right leg, sensation changes. Last known normal was around 3 pm today. Brief history was obtained and patient was promptly sent to CT for stroke rule out after POC glucose was verified. Neurology notified of code stroke activation.   The history is provided by the patient and the spouse. No language interpreter was used.      Past Medical History:  Diagnosis Date   Allergy    Arthritis    Asthma    Benign prostatic hypertrophy    Carotid stenosis    Carotid US (07/2013):  Bilateral 1-39% ICA (f/u 2 yrs)   Cataract    mild   Chest pain    a. 01/2013 Cath: LM nl, LAD 25p, D1 25p, LCX nl, RCA 25p, PDA nl, RPL nl, EF 55%;  b. 03/2015 MV: EF 59%, no ischemia; c. 03/2016 MV: basal inf/mid inf defect, likely artifact, EF 48%, low risk.   Chronic Dyspnea on exertion    a. See cardiac eval under CAD heading;  b. 03/2015 Echo: EF 50-55%, gr1 DD, triv AI;  b. 03/2015 CTA Chest: No PE;  c. 05/2016 CPX: no significant cardi-pulmonary limitation noted. Suspect obesity contributing to exertional symptoms.   Diabetes mellitus without complication ([place]) [id number]   Difficulty urinating    Diverticulitis    Diverticulosis    Exogenous obesity    GERD (gastroesophageal reflux disease)    moe like heart burn   H/O blepharoplasty 07/27/2013   H/O hiatal hernia    Hernia    Umbulical    History of adenomatous polyp of colon 1991   Hyperlipidemia    Laceration  01/11/2009    Deep laceration left index finger dorsally    Musculoskeletal pain    in the right shoulder - S/P ROTATOR CUFF REPAIR-BUT STILL HAS PAIN   Obstructive sleep apnea     CPAP    AUTO SET 6 TO 20 - USUALLY SETTLES OUT AT 10   Rectal bleeding    PT ATTRIBUTES TO HEMORRHOIDS   Sleep apnea    wears cpap    SUPRAVENTRICULAR TACHYCARDIA 08/01/2009   Qualifier: Diagnosis of  By: Lovette Cliche, CNA, Christy     Thyroiditis    Ulcer 2008   Walking pneumonia     Patient Active Problem List   Diagnosis Date Noted   Acute ischemic stroke ([place]) 02/11/2021   Irritable bowel syndrome with both constipation and diarrhea 08/01/2020   Abdominal obesity 08/01/2020   Subacute frontal sinusitis 07/21/2020   Hypothyroidism 08/23/2019   Bilateral carotid artery stenosis 05/10/2019   Bilateral hand pain 12/10/2018   Bilateral leg and foot pain 12/10/2018   OA (osteoarthritis) of hip 05/27/2018   Pulmonary emphysema ([place]) 10/30/2017   Aortic atherosclerosis ([place]) 10/30/2017   Chest tightness 03/19/2015   Numbness of left hand 03/19/2015   Insomnia 07/19/2014   Vitamin D deficiency 07/19/2014   BPH (benign prostatic hyperplasia) 07/19/2014   DM (diabetes mellitus), type 2 with complications ([place]) [id number]   Personal history of other diseases  of circulatory system 06/03/2013   History of PSVT (paroxysmal supraventricular tachycardia) 06/03/2013   GERD (gastroesophageal reflux disease) 06/03/2013   Hiatal hernia 06/03/2013   Peripheral visual field defect of both eyes 05/18/2013   Non-Obstructive CAD 03/05/2013   Essential hypertension 02/22/2013   Shortness of breath    Obstructive sleep apnea    Carotid artery stenosis 09/28/2009   Obesity (BMI 30-39.9) 08/02/2009   HLD (hyperlipidemia) 08/01/2009    Past Surgical History:  Procedure Laterality Date   COLON SURGERY  07/2012   COLONOSCOPY     for polyps   ELBOW BURSA SURGERY     Eyelid Surgery Bilateral 05/2013   INGUINAL HERNIA REPAIR     KIDNEY STONE SURGERY     KNEE SURGERY     meniscus  right knee   LAPAROSCOPIC SIGMOID COLECTOMY  07/23/2012   Procedure: LAPAROSCOPIC SIGMOID COLECTOMY;  Surgeon: [name], MD;  Location: [facility];  Service: General;  Laterality: N/A;  Laparoscopic Sigmoid Colectomy   LEFT HEART CATHETERIZATION WITH CORONARY ANGIOGRAM N/A 02/22/2013   Procedure: LEFT HEART CATHETERIZATION WITH CORONARY ANGIOGRAM;  Surgeon: [name], MD;  Location: [facility] CATH LAB;  Service: Cardiovascular;  Laterality: N/A;   PROCTOSCOPY  07/23/2012   Procedure: PROCTOSCOPY;  Surgeon: [name], MD;  Location: [facility];  Service: General;  Laterality: N/A;  Rigid Proctoscopy   ROTATOR CUFF REPAIR     TONSILLECTOMY     TOTAL HIP ARTHROPLASTY Right 05/27/2018   Procedure: RIGHT TOTAL HIP ARTHROPLASTY ANTERIOR APPROACH;  Surgeon: [name], MD;  Location: [facility];  Service: Orthopedics;  Laterality: Right;  [id number]   UMBILICAL HERNIA REPAIR  07/23/2012   Procedure: HERNIA REPAIR UMBILICAL ADULT;  Surgeon: [name], MD;  Location: [facility];  Service: General;  Laterality: N/A;  Primary Umbilical Hernia Repair       Family History  Problem Relation Age of Onset   Stroke Mother    Heart disease Mother        Valvular heart disease, Rheumatic fever   Lung cancer Maternal Uncle    Diverticulitis Son    Diverticulitis Daughter    Colon cancer Neg Hx    Colon polyps Neg Hx    Esophageal cancer Neg Hx    Rectal cancer Neg Hx    Stomach cancer Neg Hx     Social History   Tobacco Use   Smoking status: Former    Types: Cigars    Quit date: 08/12/1999    Years since quitting: 21.5   Smokeless tobacco: Never   Tobacco comments:    smoked cigars on and off for approx 4-5 yrs  Vaping Use   Vaping Use: Never used  Substance Use Topics   Alcohol use: No   Drug use: No    Home Medications Prior to Admission medications   Medication Sig Start Date End Date Taking? Authorizing Provider  acetaminophen (TYLENOL) 500 MG tablet Take 1,000 mg by mouth every 6  (six) hours as needed for moderate pain.    [provider]  albuterol (VENTOLIN HFA) 108 (90 Base) MCG/ACT inhaler Inhale 2 puffs into the lungs every 6 (six) hours as needed for wheezing or shortness of breath. 04/06/19   [name], MD  aspirin EC 81 MG tablet Take 81 mg by mouth daily. Swallow whole.    [provider]  Cholecalciferol (VITAMIN D) 50 MCG (2000 UT) tablet Take 2,000 Units by mouth daily.    [provider]  escitalopram (LEXAPRO) 20 MG tablet TAKE 1/2 TABLET ONCE DAILY 11/30/20   [name], MD  famotidine (PEPCID) 10 MG tablet Take 10 mg by mouth 2 (two) times daily.    [provider]  fluticasone Asencion Islam) 50 MCG/ACT nasal spray USE 2 SPRAYS IN EACH       NOSTRIL DAILY 11/28/20   [name], MD  HYDROcodone-acetaminophen (NORCO/VICODIN) 5-325 MG tablet Will take 1/2 tablet as needed    [provider]  hydroxypropyl methylcellulose / hypromellose (ISOPTO TEARS / GONIOVISC) 2.5 % ophthalmic solution Place 1 drop into both eyes daily as needed for dry eyes.    [provider]  [name] Oil 350 MG CAPS Take 350 mg by mouth 2 (two) times daily.    [provider]  metFORMIN (GLUCOPHAGE) 500 MG tablet TAKE 1 TABLET DAILY WITH   BREAKFAST 10/19/20   [name], MD  metoprolol tartrate (LOPRESSOR) 50 MG tablet Take 1 tablet (50 mg total) by mouth 2 (two) times daily. 08/28/20   [name], MD  montelukast (SINGULAIR) 10 MG tablet Take 1 tablet (10 mg total) by mouth at bedtime. 11/10/20   [name], MD  Multiple Vitamins-Minerals (CENTRUM SILVER PO) Take 1 tablet by mouth daily.    [provider]  nitroGLYCERIN (NITROSTAT) 0.4 MG SL tablet DISSOLVE ONE TABLET UNDER THE TONGUE EVERY 5 MINUTES AS NEEDED FOR CHEST PAIN.  DO NOT EXCEED A TOTAL OF 3 DOSES IN 15 MINUTES Patient taking differently: DISSOLVE ONE TABLET UNDER THE TONGUE EVERY 5 MINUTES AS NEEDED FOR CHEST PAIN.  DO NOT EXCEED A TOTAL OF 3  DOSES IN 15 MINUTES 04/03/16   Barrett, Evelene Croon, PA-C  ONETOUCH DELICA LANCETS [id number] MISC Check BS BID and PRN. DX. E11.8 07/19/14   Chipper Herb, MD  [facility] VERIO test strip USE TO CHECK BLOOD SUGAR TWICE DAILY AND AS NEEDED 10/22/16   Chipper Herb, MD  PRESCRIPTION MEDICATION Inhale 1 application into the lungs at bedtime. "CPAP" MACHINE    [provider]  simvastatin (ZOCOR) 20 MG tablet TAKE 1 TABLET AT BEDTIME 05/08/20   [name], MD  sodium chloride (OCEAN) 0.65 % nasal spray Place 1 spray into the nose daily as needed for congestion.     [provider]  Spacer/Aero-Holding Chambers (AEROCHAMBER MV) inhaler Use as instructed 09/16/16   Javier Glazier, MD  sucralfate (CARAFATE) 1 g tablet TAKE 1 TABLET DAILY AS     NEEDED FOR STOMACH COATING 10/19/20   [name], MD  tamsulosin (FLOMAX) 0.4 MG CAPS capsule TAKE 2 CAPSULES DAILY AFTERSUPPER 10/19/20   [name], MD  valACYclovir (VALTREX) 1000 MG tablet Take 1 tablet (1,000 mg total) by mouth 2 (two) times daily. 09/19/20   Hassell Done Mary-Margaret, FNP  zolpidem (AMBIEN) 10 MG tablet Take 1 tablet (10 mg total) by mouth at bedtime. 08/28/20   [name], MD    Allergies    Ed-apap [acetaminophen] and Penicillins  Review of Systems   Review of Systems  Constitutional:  Negative for chills and fever.  HENT:  Negative for facial swelling and trouble swallowing.   Eyes:  Negative for photophobia and visual disturbance.  Respiratory:  Negative for cough and shortness of breath.   Cardiovascular:  Negative for chest pain and palpitations.  Gastrointestinal:  Negative for abdominal pain, nausea and vomiting.  Endocrine: Negative for polydipsia and polyuria.  Genitourinary:  Negative for difficulty urinating and hematuria.  Musculoskeletal:  Negative for gait problem and joint  swelling.  Skin:  Negative for pallor and rash.  Neurological:  Positive for numbness. Negative for syncope and headaches.   Psychiatric/Behavioral:  Negative for agitation and confusion.    Physical Exam Updated Vital Signs BP (!) 141/64   Pulse (!) 48   Temp 98.7 F (37.1 C) (Oral)   Resp 19   Wt 128 kg   SpO2 93%   BMI 37.23 kg/m   Physical Exam Vitals and nursing note reviewed.  Constitutional:      General: He is not in acute distress.    Appearance: He is well-developed.  HENT:     Head: Normocephalic and atraumatic.     Right Ear: External ear normal.     Left Ear: External ear normal.     Mouth/Throat:     Mouth: Mucous membranes are moist.  Eyes:     General: No scleral icterus.    Extraocular Movements: Extraocular movements intact.     Pupils: Pupils are equal, round, and reactive to light.  Cardiovascular:     Rate and Rhythm: Regular rhythm. Bradycardia present.     Pulses: Normal pulses.     Heart sounds: Normal heart sounds.  Pulmonary:     Effort: Pulmonary effort is normal. No respiratory distress.     Breath sounds: Normal breath sounds.  Abdominal:     General: Abdomen is flat.     Palpations: Abdomen is soft.     Tenderness: There is no abdominal tenderness.  Musculoskeletal:        General: Normal range of motion.     Cervical back: Normal range of motion.     Right lower leg: No edema.     Left lower leg: No edema.  Skin:    General: Skin is warm and dry.     Capillary Refill: Capillary refill takes less than 2 seconds.  Neurological:     Mental Status: He is alert and oriented to person, place, and time.     GCS: GCS eye subscore is 4. GCS verbal subscore is 5. GCS motor subscore is 6.     Cranial Nerves: Cranial nerves are intact. No facial asymmetry.     Sensory: Sensory deficit present.     Motor: Weakness present.     Coordination: Coordination is intact.  Psychiatric:        Mood and Affect: Mood normal.        Behavior: Behavior normal.    ED Results / Procedures / Treatments   Labs (all labs ordered are listed, but only abnormal results are  displayed) Labs Reviewed  APTT - Abnormal; Notable for the following components:      Result Value   aPTT 22 (*)    All other components within normal limits  CBC - Abnormal; Notable for the following components:   WBC 14.9 (*)    MCV 101.1 (*)    All other components within normal limits  DIFFERENTIAL - Abnormal; Notable for the following components:   Neutro Abs 11.2 (*)    Monocytes Absolute 1.5 (*)    Abs Immature Granulocytes 0.26 (*)    All other components within normal limits  COMPREHENSIVE METABOLIC PANEL - Abnormal; Notable for the following components:   Glucose, Bld 121 (*)    BUN 25 (*)    Total Protein 6.4 (*)    Alkaline Phosphatase 30 (*)    GFR, Estimated 59 (*)    All other components within normal limits  I-STAT CHEM 8, ED -  Abnormal; Notable for the following components:   BUN 26 (*)    Glucose, Bld 117 (*)    Calcium, Ion 1.12 (*)    All other components within normal limits  CBG MONITORING, ED - Abnormal; Notable for the following components:   Glucose-Capillary 118 (*)    All other components within normal limits  RESP PANEL BY RT-PCR (FLU A&B, COVID) ARPGX2  SARS CORONAVIRUS 2 (TAT 6-24 HRS)  MRSA NEXT GEN BY PCR, NASAL  ETHANOL  PROTIME-INR  RAPID URINE DRUG SCREEN, HOSP PERFORMED  URINALYSIS, ROUTINE W REFLEX MICROSCOPIC  HEMOGLOBIN A1C  LIPID PANEL    EKG None  Radiology DG Chest 1 View  Result Date: 02/11/2021 CLINICAL DATA:  Cough. EXAM: CHEST  1 VIEW COMPARISON:  Radiograph 09/04/2016. Lung apices from neck CTA performed earlier today. FINDINGS: Upper normal heart size.The cardiomediastinal contours are normal. Mild elevation of right hemidiaphragm, unchanged from prior. Pulmonary vasculature is normal. No consolidation, pleural effusion, or pneumothorax. Chronic widening of the right acromioclavicular joint. No acute osseous abnormalities are seen. IMPRESSION: No acute abnormality. Upper normal heart size. Electronically Signed   By:  Keith Rake M.D.   On: 02/11/2021 20:02   CT HEAD CODE STROKE WO CONTRAST  Result Date: 02/11/2021 CLINICAL DATA:  Code stroke. Neuro deficit, acute, stroke suspected. Numbness weakness of the right arm, leg and face beginning about 3 hours ago. EXAM: CT HEAD WITHOUT CONTRAST TECHNIQUE: Contiguous axial images were obtained from the base of the skull through the vertex without intravenous contrast. COMPARISON:  03/19/2015 FINDINGS: Brain: Generalized brain atrophy. Chronic small-vessel ischemic changes of the white matter. No sign of acute infarction, mass lesion, hemorrhage, hydrocephalus or extra-axial collection. Vascular: There is atherosclerotic calcification of the major vessels at the base of the brain. Skull: Negative Sinuses/Orbits: Inflammatory changes of the right maxillary sinus. Orbits negative. Other: None ASPECTS ([place] Stroke Program Early CT Score) - Ganglionic level infarction (caudate, lentiform nuclei, internal capsule, insula, M1-M3 cortex): 7 - Supraganglionic infarction (M4-M6 cortex): 3 Total score (0-10 with 10 being normal): 10 IMPRESSION: 1. No acute finding. Atrophy and chronic small-vessel ischemic changes of the hemispheric white matter. 2. ASPECTS is 10 3. These results were communicated to Dr. Rory Percy at 6:49 pm on 02/11/2021 by text page via the [facility] messaging system. Electronically Signed   By: Nelson Chimes M.D.   On: 02/11/2021 18:51   CT ANGIO HEAD NECK W WO CM (CODE STROKE)  Result Date: 02/11/2021 CLINICAL DATA:  Right-sided numbness EXAM: CT ANGIOGRAPHY HEAD AND NECK TECHNIQUE: Multidetector CT imaging of the head and neck was performed using the standard protocol during bolus administration of intravenous contrast. Multiplanar CT image reconstructions and MIPs were obtained to evaluate the vascular anatomy. Carotid stenosis measurements (when applicable) are obtained utilizing NASCET criteria, using the distal internal carotid diameter as the denominator.  CONTRAST:  75m OMNIPAQUE IOHEXOL 350 MG/ML SOLN COMPARISON:  None. FINDINGS: CTA NECK FINDINGS SKELETON: There is no bony spinal canal stenosis. No lytic or blastic lesion. OTHER NECK: Normal pharynx, larynx and major salivary glands. No cervical lymphadenopathy. Unremarkable thyroid gland. UPPER CHEST: No pneumothorax or pleural effusion. No nodules or masses. AORTIC ARCH: There is calcific atherosclerosis of the aortic arch. There is no aneurysm, dissection or hemodynamically significant stenosis of the visualized portion of the aorta. Normal variant aortic arch branching pattern with the brachiocephalic and left common carotid arteries sharing a common origin. The visualized proximal subclavian arteries are widely patent. RIGHT CAROTID SYSTEM: No dissection,  occlusion or aneurysm. Mild atherosclerotic calcification at the carotid bifurcation without hemodynamically significant stenosis. LEFT CAROTID SYSTEM: No dissection, occlusion or aneurysm. Mild atherosclerotic calcification at the carotid bifurcation without hemodynamically significant stenosis. VERTEBRAL ARTERIES: Left dominant configuration. Both origins are clearly patent. There is no dissection, occlusion or flow-limiting stenosis to the skull base (V1-V3 segments). CTA HEAD FINDINGS POSTERIOR CIRCULATION: --Vertebral arteries: Normal V4 segments. --Inferior cerebellar arteries: Normal. --Basilar artery: Normal. --Superior cerebellar arteries: Normal. --Posterior cerebral arteries (PCA): Normal. ANTERIOR CIRCULATION: --Intracranial internal carotid arteries: Normal. --Anterior cerebral arteries (ACA): Normal. Absent left A1 segment, normal variant --Middle cerebral arteries (MCA): Normal. VENOUS SINUSES: As permitted by contrast timing, patent. ANATOMIC VARIANTS: Fetal origin of the right posterior cerebral artery. Absent left ACA A1 segment. Review of the MIP images confirms the above findings. IMPRESSION: 1. No emergent large vessel occlusion or  hemodynamically significant stenosis of the head or neck. Aortic Atherosclerosis (ICD10-I70.0).e Electronically Signed   By: Ulyses Jarred M.D.   On: 02/11/2021 19:15    Procedures .Critical Care  Date/Time: 02/11/2021 9:56 PM Performed by: Jeanell Sparrow, DO Authorized by: Jeanell Sparrow, DO   Critical care provider statement:    Critical care time (minutes):  32   Critical care time was exclusive of:  Separately billable procedures and treating other patients   Critical care was necessary to treat or prevent imminent or life-threatening deterioration of the following conditions:  CNS failure or compromise   Critical care was time spent personally by me on the following activities:  Discussions with consultants, evaluation of patient's response to treatment, examination of patient, ordering and performing treatments and interventions, ordering and review of laboratory studies, ordering and review of radiographic studies, pulse oximetry, re-evaluation of patient's condition, obtaining history from patient or surrogate and review of old charts   Medications Ordered in ED Medications  alteplase (ACTIVASE) 1 mg/mL infusion SOLN 90 mg (0 mg Intravenous Stopped 02/11/21 1956)    Followed by  0.9 %  sodium chloride infusion (has no administration in time range)  0.9 %  sodium chloride infusion ( Intravenous Infusion Verify 02/11/21 2100)  senna-docusate (Senokot-S) tablet 1 tablet (has no administration in time range)  pantoprazole (PROTONIX) injection 40 mg (40 mg Intravenous Given 02/11/21 2144)  insulin aspart (novoLOG) injection 0-20 Units (has no administration in time range)  Chlorhexidine Gluconate Cloth 2 % PADS 6 each (6 each Topical Given 02/11/21 2037)  iohexol (OMNIPAQUE) 350 MG/ML injection 75 mL (75 mLs Intravenous Contrast Given 02/11/21 1854)   stroke: mapping our early stages of recovery book ( Does not apply Given 02/11/21 2145)    ED Course  I have reviewed the triage vital signs  and the nursing notes.  Pertinent labs & imaging results that were available during my care of the patient were reviewed by me and considered in my medical decision making (see chart for details).    MDM Rules/Calculators/A&P                           80 yo male with history as above presented to ED with stroke like symptoms  Right sided numbness/weakness  Neurology notified, code stroke  Sent to CT emergently  No intracranial bleed  Neurology has d/w pt regarding risks benefits of tPA and this was administered. Plan to admit patient to ICU for post TPA care. Family is agreeable. No further questions by patient or spouse    Final Clinical Impression(s) /  ED Diagnoses Final diagnoses:  Cough  Cerebrovascular accident (CVA), unspecified mechanism ([place])  Right sided weakness    Rx / DC Orders ED Discharge Orders     None        Jeanell Sparrow, DO 02/11/21 2159

## 2021-02-12 ENCOUNTER — Inpatient Hospital Stay (HOSPITAL_COMMUNITY): Payer: No Typology Code available for payment source

## 2021-02-12 DIAGNOSIS — R9431 Abnormal electrocardiogram [ECG] [EKG]: Secondary | ICD-10-CM

## 2021-02-12 DIAGNOSIS — I6330 Cerebral infarction due to thrombosis of unspecified cerebral artery: Secondary | ICD-10-CM | POA: Diagnosis not present

## 2021-02-12 LAB — URINALYSIS, ROUTINE W REFLEX MICROSCOPIC
Bilirubin Urine: NEGATIVE
Glucose, UA: NEGATIVE mg/dL
Hgb urine dipstick: NEGATIVE
Ketones, ur: NEGATIVE mg/dL
Leukocytes,Ua: NEGATIVE
Nitrite: NEGATIVE
Protein, ur: NEGATIVE mg/dL
Specific Gravity, Urine: 1.014 (ref 1.005–1.030)
pH: 6 (ref 5.0–8.0)

## 2021-02-12 LAB — LIPID PANEL
Cholesterol: 125 mg/dL (ref 0–200)
HDL: 50 mg/dL (ref >40)
LDL Cholesterol: 54 mg/dL (ref 0–99)
Total CHOL/HDL Ratio: 2.5 RATIO
Triglycerides: 107 mg/dL (ref <150)
VLDL: 21 mg/dL (ref 0–40)

## 2021-02-12 LAB — RAPID URINE DRUG SCREEN, HOSP PERFORMED
Amphetamines: NOT DETECTED
Barbiturates: NOT DETECTED
Benzodiazepines: NOT DETECTED
Cocaine: NOT DETECTED
Opiates: NOT DETECTED
Tetrahydrocannabinol: NOT DETECTED

## 2021-02-12 LAB — GLUCOSE, CAPILLARY
Glucose-Capillary: 129 mg/dL — ABNORMAL HIGH (ref 70–99)
Glucose-Capillary: 170 mg/dL — ABNORMAL HIGH (ref 70–99)
Glucose-Capillary: 89 mg/dL (ref 70–99)
Glucose-Capillary: 98 mg/dL (ref 70–99)

## 2021-02-12 LAB — HEMOGLOBIN A1C
Hgb A1c MFr Bld: 6.2 % — ABNORMAL HIGH (ref 4.8–5.6)
Mean Plasma Glucose: 131.24 mg/dL

## 2021-02-12 LAB — ECHOCARDIOGRAM COMPLETE
Area-P 1/2: 3.05 cm2
S' Lateral: 3.9 cm
Weight: 4515.02 oz

## 2021-02-12 MED ORDER — CLOPIDOGREL BISULFATE 75 MG PO TABS: 75.0000 mg | ORAL_TABLET | Freq: Every day | ORAL | Status: DC

## 2021-02-12 MED ORDER — CLOPIDOGREL BISULFATE 75 MG PO TABS
75.0000 mg | ORAL_TABLET | Freq: Every day | ORAL | Status: DC
  Administered 2021-02-12: 75 mg via ORAL
  Filled 2021-02-12: qty 1

## 2021-02-12 MED ORDER — HALOPERIDOL LACTATE 5 MG/ML IJ SOLN: 2.0000 mg | Freq: Once | INTRAMUSCULAR | Status: DC

## 2021-02-12 MED ORDER — CLOPIDOGREL BISULFATE 75 MG PO TABS: 75.0000 mg | ORAL_TABLET | Freq: Every day | ORAL | 0 refills | Status: DC

## 2021-02-12 MED ORDER — METOPROLOL TARTRATE 50 MG PO TABS: 25.0000 mg | ORAL_TABLET | Freq: Two times a day (BID) | ORAL | 0 refills | Status: DC

## 2021-02-12 MED ORDER — ZOLPIDEM TARTRATE 10 MG PO TABS: 10.0000 mg | ORAL_TABLET | Freq: Every evening | ORAL | 0 refills | Status: DC | PRN

## 2021-02-12 MED ORDER — ASPIRIN EC 81 MG PO TBEC: 81.0000 mg | DELAYED_RELEASE_TABLET | Freq: Every day | ORAL | Status: DC

## 2021-02-12 MED ORDER — ASPIRIN EC 81 MG PO TBEC
81.0000 mg | DELAYED_RELEASE_TABLET | Freq: Every day | ORAL | Status: DC
  Administered 2021-02-12: 81 mg via ORAL
  Filled 2021-02-12: qty 1

## 2021-02-12 MED ORDER — LORAZEPAM 2 MG/ML IJ SOLN: 1.0000 mg | Freq: Once | INTRAMUSCULAR | Status: DC

## 2021-02-12 NOTE — Progress Notes (Signed)
Occupational Therapy Evaluation Patient Details Name: Cristian Moore MRN: [id number] DOB: 1941-01-17 Today's Date: 02/12/2021    History of Present Illness 80 y.o. male admitted with sudden onset right sided numbness. Received tPA.  CVA wrok up underway. PMH of CAD, HTN, HLD, DM, mild carotid stenosis, sleep apnea, chronic cough, gastroesophageal reflux disease, R THA, s/p R rotator cuff repair.   Clinical Impression   PTA pt lives with his wife and is independent with ADL and mobility with occasional use of a cane. Pt appears close to baseline level of functioning with minimal complaints of RLE sensory changes. VSS during session. Completed education regarding warning signs/symptoms using [employer]. Educated pt on strategies to reduce risk of falls. No further OT needed.    Follow Up Recommendations  No OT follow up    Equipment Recommendations  None recommended by OT    Recommendations for Other Services       Precautions / Restrictions Precautions Precautions: None      Mobility Bed Mobility                    Transfers Overall transfer level: Needs assistance   Transfers: Sit to/from Stand Sit to Stand: Supervision              Balance Overall balance assessment: Mild deficits observed, not formally tested (hx of R hip replacement/paina dn back pain)                                         ADL either performed or assessed with clinical judgement   ADL Overall ADL's : At baseline                                             Vision Baseline Vision/History: Wears glasses Wears Glasses: At all times Patient Visual Report: No change from baseline Vision Assessment?: Yes Eye Alignment: Within Functional Limits Ocular Range of Motion: Within Functional Limits Alignment/Gaze Preference: Within Defined Limits Tracking/Visual Pursuits: Able to track stimulus in all quads without difficulty Saccades: Within functional  limits Convergence: Within functional limits     Perception Perception Comments: [facility]   Praxis Praxis Praxis tested?: Within functional limits    Pertinent Vitals/Pain Pain Assessment: No/denies pain     Hand Dominance Right   Extremity/Trunk Assessment Upper Extremity Assessment Upper Extremity Assessment: Overall WFL for tasks assessed (minimal numbness; feels that it is back to normal)   Lower Extremity Assessment Lower Extremity Assessment: Defer to PT evaluation   Cervical / Trunk Assessment Cervical / Trunk Assessment: Normal   Communication Communication Communication: No difficulties   Cognition Arousal/Alertness: Awake/alert Behavior During Therapy: WFL for tasks assessed/performed Overall Cognitive Status: No family/caregiver present to determine baseline cognitive functioning                                 General Comments: appears at baseline   General Comments  BP 164/66; SpO2 above 90 during activity; HR 62    Exercises     Shoulder Instructions      Home Living Family/patient expects to be discharged to:: Private residence Living Arrangements: Spouse/significant other Available Help at Discharge: Available 24 hours/day Type of Home: House Home Access:  Stairs to enter [employer] of Steps: 3 Entrance Stairs-Rails: None (holds onto "doorfacing") Home Layout: One level;Laundry or work area in basement     [employer]: Occupational psychologist: Handicapped height Bathroom Accessibility: Yes How Accessible: Accessible via [place]: Environmental consultant - 2 wheels;Walker - 4 wheels;Cane - single point;Shower seat - built in;Grab bars - tub/shower;Electric scooter;Wheelchair - manual          Prior Functioning/Environment Level of Independence: Independent with assistive device(s)        Comments: uses his cane at night "because of the flashlight" (drives; 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; wife does finances)        OT Problem List: Impaired balance (sitting and/or standing);Obesity      OT Treatment/Interventions:      OT Goals(Current goals can be found in the care plan section) Acute Rehab OT Goals Patient Stated Goal: to find out what happened adn go home OT Goal Formulation: All assessment and education complete, DC therapy  OT Frequency:     Barriers to D/C:            Co-evaluation              AM-PAC OT "6 Clicks" Daily Activity     Outcome Measure Help from another person eating meals?: None Help from another person taking care of personal grooming?: None Help from another person toileting, which includes using toliet, bedpan, or urinal?: None Help from another person bathing (including washing, rinsing, drying)?: None Help from another person to put on and taking off regular upper body clothing?: None Help from another person to put on and taking off regular lower body clothing?: None 6 Click Score: 24   End of Session Equipment Utilized During Treatment: Gait belt;Rolling walker Nurse Communication: Mobility status  Activity Tolerance: Patient tolerated treatment well Patient left: in chair;with call bell/phone within reach  OT Visit Diagnosis: Unsteadiness on feet (R26.81)                Time: [id number] OT Time Calculation (min): 29 min Charges:  OT General Charges $OT Visit: 1 Visit OT Evaluation $OT Eval Moderate Complexity: 1 Mod OT Treatments $Self Care/Home Management : 8-22 mins  Maurie Boettcher, OT/L   Acute OT Clinical Specialist Acute Rehabilitation Services Pager [phone] Office [phone]   [facility] 02/12/2021, 10:49 AM

## 2021-02-12 NOTE — Progress Notes (Signed)
PT Cancellation Note  Patient Details Name: Cristian Moore MRN: [id number] DOB: 06/10/1941   Cancelled Treatment:    Reason Eval/Treat Not Completed: Active bedrest order  Wyona Almas, PT, DPT Acute Rehabilitation Services Pager [phone] Office [phone]    Deno Etienne 02/12/2021, 7:35 AM

## 2021-02-12 NOTE — Research (Signed)
Discussed participation in [place] study with [name] and his wife. Information sheet provided. No questions at this time. He has agreed to participate in the study with data collection and is aware of the phone call questionnaire in 90 days.

## 2021-02-12 NOTE — Progress Notes (Signed)
OT Cancellation Note  Patient Details Name: Cristian Moore MRN: [id number] DOB: [birth year]   Cancelled Treatment:    Reason Eval/Treat Not Completed: Active bedrest order (Will assess when activity orders updated. Thanks)  [place], OT/L   Acute OT Clinical Specialist Acute Rehabilitation Services Pager [phone] Office [phone]  02/12/2021, 7:40 AM

## 2021-02-12 NOTE — Evaluation (Signed)
Physical Therapy Evaluation and Discharge Patient Details Name: Cristian Moore MRN: [id number] DOB: [birth year] Today's Date: 02/12/2021   History of Present Illness  80 y.o. male admitted with sudden onset right sided numbness. Received tPA.  CVA wrok up underway. PMH of CAD, HTN, HLD, DM, mild carotid stenosis, sleep apnea, chronic cough, gastroesophageal reflux disease, R THA, s/p R rotator cuff repair.  Clinical Impression  Patient evaluated by Physical Therapy with no further acute PT needs identified. Pt appears to be fairly close to his functional baseline other than minimal complaints of distal RLE sensory changes. Intact to light touch upon assessment. Pt ambulating 250 feet with no assistive device without physical assist. Demonstrates mild antalgic gait pattern, which pt states is normal due to prior history of THA. Recommended use of cane for unlevel surfaces to improve stability and pain. All education has been completed and the patient has no further questions. No follow-up Physical Therapy or equipment needs. PT is signing off. Thank you for this referral.     Follow Up Recommendations No PT follow up    Equipment Recommendations  None recommended by PT    Recommendations for Other Services       Precautions / Restrictions Precautions Precautions: None Restrictions Weight Bearing Restrictions: No      Mobility  Bed Mobility                    Transfers Overall transfer level: Independent Equipment used: None Transfers: Sit to/from Stand Sit to Stand: Supervision            Ambulation/Gait Ambulation/Gait assistance: Modified independent (Device/Increase time) Gait Distance (Feet): 250 Feet Assistive device: None;IV Pole Gait Pattern/deviations: Step-through pattern;Decreased stance time - right;Decreased stride length;Antalgic Gait velocity: decreased   General Gait Details: Pt with mildly antalgic gait pattern, which pt reports is baseline due  to hx of THA. No gross instability noted  Stairs            Wheelchair Mobility    Modified Rankin (Stroke Patients Only) Modified Rankin (Stroke Patients Only) Pre-Morbid Rankin Score: No symptoms Modified Rankin: No significant disability     Balance Overall balance assessment: Mild deficits observed, not formally tested (hx of R hip replacement/pain and back pain)                                           Pertinent Vitals/Pain Pain Assessment: No/denies pain    Home Living Family/patient expects to be discharged to:: Private residence Living Arrangements: Spouse/significant other Available Help at Discharge: Available 24 hours/day Type of Home: House Home Access: Stairs to enter Entrance Stairs-Rails: None (holds onto "doorfacing") Entrance Stairs-Number of Steps: 3 Home Layout: One level;Laundry or work area in [place]: Environmental consultant - 2 wheels;Walker - 4 wheels;Cane - single point;Shower seat - built in;Grab bars - tub/shower;Electric scooter;Wheelchair - manual      Prior Function Level of Independence: Independent with assistive device(s)         Comments: uses his cane at night "because of the flashlight" (drives; 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 Pt reports "funny feeling," in foot. Strength 5/5 RLE Sensation: WNL LLE Deficits / Details: strength 5/5 LLE Sensation: WNL    Cervical / Trunk Assessment Cervical / Trunk Assessment: Normal  Communication   Communication: No difficulties  Cognition Arousal/Alertness: Awake/alert Behavior During Therapy: WFL for tasks assessed/performed Overall  Cognitive Status: Within Functional Limits for tasks assessed                                 General Comments: appears at baseline      General Comments General comments (skin integrity, edema, etc.): spO2 92% on RA, BP 164/66    Exercises     Assessment/Plan    PT Assessment Patent does not need any further PT services  PT Problem List Decreased activity tolerance;Decreased balance;Decreased mobility       PT Treatment Interventions      PT Goals (Current goals can be found in the Care Plan section)  Acute Rehab PT Goals Patient Stated Goal: to find out what happened adn go home PT Goal Formulation: All assessment and education complete, DC therapy    Frequency     Barriers to discharge        Co-evaluation               AM-PAC PT "6 Clicks" Mobility  Outcome Measure Help needed turning from your back to your side while in a flat bed without using bedrails?: None Help needed moving from lying on your back to sitting on the side of a flat bed without using bedrails?: None Help needed moving to and from a bed to a chair (including a wheelchair)?: None Help needed standing up from a chair using your arms (e.g., wheelchair or bedside chair)?: None Help needed to walk in hospital room?: None Help needed climbing 3-5 steps with a railing? : A Little 6 Click Score: 23    End of Session   Activity Tolerance: Patient tolerated treatment well Patient left: in chair;with call bell/phone within reach Nurse Communication: Mobility status PT Visit Diagnosis: Difficulty in walking, not elsewhere classified (R26.2)    Time: 1024-1040 PT Time Calculation (min) (ACUTE ONLY): 16 min   Charges:   PT Evaluation $PT Eval Low Complexity: [place], PT, DPT Acute Rehabilitation Services Pager [phone] Office [phone]   Deno Etienne 02/12/2021, 1:01 PM

## 2021-02-12 NOTE — Progress Notes (Signed)
  Echocardiogram 2D Echocardiogram has been performed.  Cristian Moore 02/12/2021, 9:33 AM

## 2021-02-12 NOTE — Progress Notes (Signed)
SLP Cancellation Note  Patient Details Name: Cristian Moore MRN: [id number] DOB: 11/10/1940   Cancelled treatment:       Reason Eval/Treat Not Completed: SLP screened, no needs identified, will sign off. Per chart review and speaking briefly with patient and his spouse, formal speech-language evaluation not warranted at this time. Thank you for this consult!  Sonia Baller, MA, CCC-SLP Speech Therapy [facility] Acute Rehab

## 2021-02-12 NOTE — Discharge Summary (Addendum)
Stroke Discharge Summary  Patient ID: Cristian Moore   MRN: [id number]      DOB: [birth year]  Date of Admission: 02/11/2021 Date of Discharge: 02/12/2021  Attending Physician:  Stroke, Md, MD, Stroke MD Patient's PCP:  [name], MD  DISCHARGE DIAGNOSIS: Small vessel lacunar stroke left subcortical. Patient unable to tolerate MRI Principal Problem:   CVA (cerebral vascular accident) ([place]) Active Problems:   HLD (hyperlipidemia)   Obesity (BMI 30-39.9)   Obstructive sleep apnea   DM (diabetes mellitus), type 2 with complications (HCC)   BPH (benign prostatic hyperplasia)   GERD (gastroesophageal reflux disease)   Hypothyroidism   Allergies as of 02/12/2021       Reactions   Penicillins Rash, Hives   Over 60 years ago Has patient had a PCN reaction causing immediate rash, facial/tongue/throat swelling, SOB or lightheadedness with hypotension: Unknown Has patient had a PCN reaction causing severe rash involving mucus membranes or skin necrosis: Unknown Has patient had a PCN reaction that required hospitalization: No Has patient had a PCN reaction occurring within the last 10 years: No If all of the above answers are "NO", then may proceed with Cephalosporin use.        Medication List     STOP taking these medications    methocarbamol 500 MG tablet Commonly known as: ROBAXIN   predniSONE 10 MG (21) Tbpk tablet Commonly known as: STERAPRED UNI-PAK 21 TAB   SALONPAS EX   valACYclovir 1000 MG tablet Commonly known as: Valtrex       TAKE these medications    acetaminophen 500 MG tablet Commonly known as: TYLENOL Take 1,000 mg by mouth every 6 (six) hours as needed for moderate pain.   AeroChamber MV inhaler Use as instructed   albuterol 108 (90 Base) MCG/ACT inhaler Commonly known as: VENTOLIN HFA Inhale 2 puffs into the lungs every 6 (six) hours as needed for wheezing or shortness of breath.   aspirin EC 81 MG tablet Take 81 mg by mouth daily.  Swallow whole.   CENTRUM SILVER PO Take 1 tablet by mouth daily.   clopidogrel 75 MG tablet Commonly known as: PLAVIX Take 1 tablet (75 mg total) by mouth daily.   diclofenac Sodium 1 % Gel Commonly known as: VOLTAREN Apply 2 g topically 4 (four) times daily as needed (shoulders/knees/back pain).   escitalopram 20 MG tablet Commonly known as: LEXAPRO TAKE 1/2 TABLET ONCE DAILY What changed:  how much to take how to take this when to take this additional instructions   famotidine 10 MG tablet Commonly known as: PEPCID Take 10 mg by mouth 2 (two) times daily as needed for heartburn or indigestion.   fluticasone 50 MCG/ACT nasal spray Commonly known as: FLONASE USE 2 SPRAYS IN EACH       NOSTRIL DAILY   HYDROcodone-acetaminophen 5-325 MG tablet Commonly known as: NORCO/VICODIN Take 0.5 tablets by mouth every 6 (six) hours as needed for moderate pain.   hydroxypropyl methylcellulose / hypromellose 2.5 % ophthalmic solution Commonly known as: ISOPTO TEARS / GONIOVISC Place 1 drop into both eyes 4 (four) times daily as needed for dry eyes.   ibuprofen 200 MG tablet Commonly known as: ADVIL Take 400 mg by mouth every 6 (six) hours as needed for headache, fever or mild pain.   Krill Oil 350 MG Caps Take 350 mg by mouth daily.   metFORMIN 500 MG tablet Commonly known as: GLUCOPHAGE TAKE 1 TABLET DAILY WITH   BREAKFAST  metoprolol tartrate 50 MG tablet Commonly known as: LOPRESSOR Take 0.5 tablets (25 mg total) by mouth 2 (two) times daily.   montelukast 10 MG tablet Commonly known as: SINGULAIR Take 1 tablet (10 mg total) by mouth at bedtime.   nitroGLYCERIN 0.4 MG SL tablet Commonly known as: Nitrostat DISSOLVE ONE TABLET UNDER THE TONGUE EVERY 5 MINUTES AS NEEDED FOR CHEST PAIN.  DO NOT EXCEED A TOTAL OF 3 DOSES IN 15 MINUTES What changed:  how much to take how to take this when to take this reasons to take this additional instructions   OneTouch Delica  Lancets 99991111 Misc Check BS BID and PRN. DX. E11.8   OneTouch Verio test strip Generic drug: glucose blood USE TO CHECK BLOOD SUGAR TWICE DAILY AND AS NEEDED   PRESCRIPTION MEDICATION Inhale 1 application into the lungs at bedtime. "CPAP" MACHINE   simvastatin 20 MG tablet Commonly known as: ZOCOR TAKE 1 TABLET AT BEDTIME What changed: when to take this   sucralfate 1 g tablet Commonly known as: CARAFATE TAKE 1 TABLET DAILY AS     NEEDED FOR STOMACH COATING What changed: See the new instructions.   tamsulosin 0.4 MG Caps capsule Commonly known as: FLOMAX TAKE 2 CAPSULES DAILY AFTERSUPPER What changed: See the new instructions.   Vitamin D 50 MCG (2000 UT) tablet Take 2,000 Units by mouth daily.   zolpidem 10 MG tablet Commonly known as: AMBIEN Take 1 tablet (10 mg total) by mouth at bedtime as needed for sleep.        LABORATORY STUDIES CBC    Component Value Date/Time   WBC 14.9 (H) 02/11/2021 1839   RBC 4.42 02/11/2021 1839   HGB 14.3 02/11/2021 1845   HGB 14.9 08/28/2020 0811   HCT 42.0 02/11/2021 1845   HCT 45.3 08/28/2020 0811   PLT 306 02/11/2021 1839   PLT 292 08/28/2020 0811   MCV 101.1 (H) 02/11/2021 1839   MCV 97 08/28/2020 0811   MCH 33.0 02/11/2021 1839   MCHC 32.7 02/11/2021 1839   RDW 13.3 02/11/2021 1839   RDW 12.5 08/28/2020 0811   LYMPHSABS 1.9 02/11/2021 1839   LYMPHSABS 2.7 08/28/2020 0811   MONOABS 1.5 (H) 02/11/2021 1839   EOSABS 0.0 02/11/2021 1839   EOSABS 0.2 08/28/2020 0811   BASOSABS 0.1 02/11/2021 1839   BASOSABS 0.1 08/28/2020 0811   CMP    Component Value Date/Time   NA 137 02/11/2021 1845   NA 142 08/28/2020 0811   K 4.5 02/11/2021 1845   CL 104 02/11/2021 1845   CO2 25 02/11/2021 1839   GLUCOSE 117 (H) 02/11/2021 1845   BUN 26 (H) 02/11/2021 1845   BUN 15 08/28/2020 0811   CREATININE 1.20 02/11/2021 1845   CALCIUM 9.7 02/11/2021 1839   PROT 6.4 (L) 02/11/2021 1839   PROT 6.5 08/28/2020 0811   ALBUMIN 3.8  02/11/2021 1839   ALBUMIN 4.5 08/28/2020 0811   AST 19 02/11/2021 1839   ALT 25 02/11/2021 1839   ALKPHOS 30 (L) 02/11/2021 1839   BILITOT 0.6 02/11/2021 1839   BILITOT 0.5 08/28/2020 0811   GFRNONAA 59 (L) 02/11/2021 1839   GFRAA 68 02/24/2020 0809   COAGS Lab Results  Component Value Date   INR 1.0 02/11/2021   INR 0.87 05/19/2018   INR 1.06 03/19/2015   Lipid Panel    Component Value Date/Time   CHOL 125 02/12/2021 0049   CHOL 159 08/28/2020 0811   TRIG 107 02/12/2021 0049   TRIG  111 01/22/2017 1027   HDL 50 02/12/2021 0049   HDL 47 08/28/2020 0811   HDL 39 (L) 01/22/2017 1027   CHOLHDL 2.5 02/12/2021 0049   VLDL 21 02/12/2021 0049   LDLCALC 54 02/12/2021 0049   LDLCALC 80 08/28/2020 0811   LDLCALC 89 01/31/2014 1010   HgbA1C  Lab Results  Component Value Date   HGBA1C 6.2 (H) 02/12/2021   Urinalysis    Component Value Date/Time   COLORURINE STRAW (A) 02/12/2021 0522   APPEARANCEUR CLEAR 02/12/2021 0522   APPEARANCEUR Clear 03/09/2018 0952   LABSPEC 1.014 02/12/2021 0522   PHURINE 6.0 02/12/2021 0522   GLUCOSEU NEGATIVE 02/12/2021 0522   HGBUR NEGATIVE 02/12/2021 0522   BILIRUBINUR NEGATIVE 02/12/2021 0522   BILIRUBINUR Negative 03/09/2018 0952   KETONESUR NEGATIVE 02/12/2021 0522   PROTEINUR NEGATIVE 02/12/2021 0522   UROBILINOGEN negative 11/24/2014 1207   UROBILINOGEN 0.2 05/15/2011 0955   NITRITE NEGATIVE 02/12/2021 0522   LEUKOCYTESUR NEGATIVE 02/12/2021 0522   Urine Drug Screen     Component Value Date/Time   LABOPIA NONE DETECTED 02/12/2021 0522   COCAINSCRNUR NONE DETECTED 02/12/2021 0522   LABBENZ NONE DETECTED 02/12/2021 0522   AMPHETMU NONE DETECTED 02/12/2021 0522   THCU NONE DETECTED 02/12/2021 0522   LABBARB NONE DETECTED 02/12/2021 0522    Alcohol Level    Component Value Date/Time   ETH <10 02/11/2021 1839     SIGNIFICANT DIAGNOSTIC STUDIES DG Chest 1 View  Result Date: 02/11/2021 CLINICAL DATA:  Cough. EXAM: CHEST  1  VIEW COMPARISON:  Radiograph 09/04/2016. Lung apices from neck CTA performed earlier today. FINDINGS: Upper normal heart size.The cardiomediastinal contours are normal. Mild elevation of right hemidiaphragm, unchanged from prior. Pulmonary vasculature is normal. No consolidation, pleural effusion, or pneumothorax. Chronic widening of the right acromioclavicular joint. No acute osseous abnormalities are seen. IMPRESSION: No acute abnormality. Upper normal heart size. Electronically Signed   By: Keith Rake M.D.   On: 02/11/2021 20:02   CT HEAD WO CONTRAST (5MM)  Result Date: 02/12/2021 CLINICAL DATA:  Stroke, tPA follow-up EXAM: CT HEAD WITHOUT CONTRAST TECHNIQUE: Contiguous axial images were obtained from the base of the skull through the vertex without intravenous contrast. COMPARISON:  02/11/2021 FINDINGS: Brain: There is no acute intracranial hemorrhage, mass effect, or edema. No new loss of gray-white differentiation. There is no extra-axial fluid collection. Stable findings of probable chronic microvascular ischemic changes. Ventricles and sulci are stable in size and configuration. Vascular: There is atherosclerotic calcification at the skull base. Skull: Calvarium is unremarkable. Sinuses/Orbits: No acute abnormality. Other: None. IMPRESSION: No acute intracranial hemorrhage or evidence of evolving recent infarction. Electronically Signed   By: Macy Mis M.D.   On: 02/12/2021 14:39   ECHOCARDIOGRAM COMPLETE  Result Date: 02/12/2021    ECHOCARDIOGRAM REPORT   Patient Name:   BARY FERRAIOLO Date of Exam: 02/12/2021 Medical Rec #:  [id number]        Height:       73.0 in Accession #:    [id number]       Weight:       282.2 lb Date of Birth:  1940-09-14        BSA:          2.491 m Patient Age:    89 years         BP:           137/86 mmHg Patient Gender: M  HR:           105 bpm. Exam Location:  Inpatient Procedure: 2D Echo, Cardiac Doppler and Color Doppler Indications:     Abnormal EKG  History:        Patient has prior history of Echocardiogram examinations, most                 recent 09/05/2018. Arrythmias:PSVT; Risk Factors:Hypertension and                 Dyslipidemia.  Sonographer:    Dustin Flock RDCS Referring Phys: [id number] ASHISH ARORA IMPRESSIONS  1. Left ventricular ejection fraction, by estimation, is 50 to 55%. The left ventricle has low normal function. Left ventricular endocardial border not optimally defined to evaluate regional wall motion. The left ventricular internal cavity size was mildly dilated. Left ventricular diastolic parameters are consistent with Grade I diastolic dysfunction (impaired relaxation).  2. Right ventricular systolic function is normal. The right ventricular size is normal. There is normal pulmonary artery systolic pressure.  3. The mitral valve is normal in structure. No evidence of mitral valve regurgitation. No evidence of mitral stenosis.  4. The aortic valve is tricuspid. Aortic valve regurgitation is trivial. No aortic stenosis is present.  5. The inferior vena cava is normal in size with greater than 50% respiratory variability, suggesting right atrial pressure of 3 mmHg. Comparison(s): A prior study was performed on 09/05/2018. No significant change from prior study. FINDINGS  Left Ventricle: Left ventricular ejection fraction, by estimation, is 50 to 55%. The left ventricle has low normal function. Left ventricular endocardial border not optimally defined to evaluate regional wall motion. The left ventricular internal cavity  size was mildly dilated. There is no left ventricular hypertrophy. Left ventricular diastolic parameters are consistent with Grade I diastolic dysfunction (impaired relaxation). Right Ventricle: The right ventricular size is normal. No increase in right ventricular wall thickness. Right ventricular systolic function is normal. There is normal pulmonary artery systolic pressure. The tricuspid regurgitant velocity  is 2.40 m/s, and  with an assumed right atrial pressure of 3 mmHg, the estimated right ventricular systolic pressure is [id number] mmHg. Left Atrium: Left atrial size was normal in size. Right Atrium: Right atrial size was normal in size. Pericardium: There is no evidence of pericardial effusion. Mitral Valve: The mitral valve is normal in structure. Mild mitral annular calcification. No evidence of mitral valve regurgitation. No evidence of mitral valve stenosis. Tricuspid Valve: The tricuspid valve is grossly normal. Tricuspid valve regurgitation is trivial. No evidence of tricuspid stenosis. Aortic Valve: The aortic valve is tricuspid. Aortic valve regurgitation is trivial. No aortic stenosis is present. Pulmonic Valve: The pulmonic valve was normal in structure. Pulmonic valve regurgitation is not visualized. No evidence of pulmonic stenosis. Aorta: The aortic root is normal in size and structure and the ascending aorta was not well visualized. Venous: The inferior vena cava is normal in size with greater than 50% respiratory variability, suggesting right atrial pressure of 3 mmHg. IAS/Shunts: The atrial septum is grossly normal.  LEFT VENTRICLE PLAX 2D LVIDd:         5.30 cm  Diastology LVIDs:         3.90 cm  LV e' medial:    6.96 cm/s LV PW:         1.10 cm  LV E/e' medial:  7.2 LV IVS:        0.90 cm  LV e' lateral:   9.36 cm/s LVOT diam:  2.10 cm  LV E/e' lateral: 5.4 LV SV:         72 LV SV Index:   29 LVOT Area:     3.46 cm  RIGHT VENTRICLE RV Basal diam:  3.80 cm RV S prime:     5.44 cm/s TAPSE (M-mode): 2.5 cm LEFT ATRIUM             Index       RIGHT ATRIUM           Index LA diam:        3.80 cm 1.53 cm/m  RA Area:     15.70 cm LA Vol (A2C):   38.7 ml 15.54 ml/m RA Volume:   46.20 ml  18.55 ml/m LA Vol (A4C):   39.0 ml 15.66 ml/m LA Biplane Vol: 40.3 ml 16.18 ml/m  AORTIC VALVE LVOT Vmax:   77.70 cm/s LVOT Vmean:  52.400 cm/s LVOT VTI:    0.207 m  AORTA Ao Root diam: 3.20 cm MITRAL VALVE                TRICUSPID VALVE MV Area (PHT): 3.05 cm    TR Peak grad:   23.0 mmHg MV Decel Time: 249 msec    TR Vmax:        240.00 cm/s MV E velocity: 50.10 cm/s MV A velocity: 79.30 cm/s  SHUNTS MV E/A ratio:  0.63        Systemic VTI:  0.21 m                            Systemic Diam: 2.10 cm Rudean Haskell MD Electronically signed by Rudean Haskell MD Signature Date/Time: 02/12/2021/11:51:16 AM    Final    CT HEAD CODE STROKE WO CONTRAST  Result Date: 02/11/2021 CLINICAL DATA:  Code stroke. Neuro deficit, acute, stroke suspected. Numbness weakness of the right arm, leg and face beginning about 3 hours ago. EXAM: CT HEAD WITHOUT CONTRAST TECHNIQUE: Contiguous axial images were obtained from the base of the skull through the vertex without intravenous contrast. COMPARISON:  03/19/2015 FINDINGS: Brain: Generalized brain atrophy. Chronic small-vessel ischemic changes of the white matter. No sign of acute infarction, mass lesion, hemorrhage, hydrocephalus or extra-axial collection. Vascular: There is atherosclerotic calcification of the major vessels at the base of the brain. Skull: Negative Sinuses/Orbits: Inflammatory changes of the right maxillary sinus. Orbits negative. Other: None ASPECTS ([place] Stroke Program Early CT Score) - Ganglionic level infarction (caudate, lentiform nuclei, internal capsule, insula, M1-M3 cortex): 7 - Supraganglionic infarction (M4-M6 cortex): 3 Total score (0-10 with 10 being normal): 10 IMPRESSION: 1. No acute finding. Atrophy and chronic small-vessel ischemic changes of the hemispheric white matter. 2. ASPECTS is 10 3. These results were communicated to Dr. Rory Percy at 6:49 pm on 02/11/2021 by text page via the [facility] messaging system. Electronically Signed   By: Nelson Chimes M.D.   On: 02/11/2021 18:51   CT ANGIO HEAD NECK W WO CM (CODE STROKE)  Result Date: 02/11/2021 CLINICAL DATA:  Right-sided numbness EXAM: CT ANGIOGRAPHY HEAD AND NECK TECHNIQUE: Multidetector CT imaging  of the head and neck was performed using the standard protocol during bolus administration of intravenous contrast. Multiplanar CT image reconstructions and MIPs were obtained to evaluate the vascular anatomy. Carotid stenosis measurements (when applicable) are obtained utilizing NASCET criteria, using the distal internal carotid diameter as the denominator. CONTRAST:  29m OMNIPAQUE IOHEXOL 350 MG/ML SOLN COMPARISON:  None. FINDINGS:  CTA NECK FINDINGS SKELETON: There is no bony spinal canal stenosis. No lytic or blastic lesion. OTHER NECK: Normal pharynx, larynx and major salivary glands. No cervical lymphadenopathy. Unremarkable thyroid gland. UPPER CHEST: No pneumothorax or pleural effusion. No nodules or masses. AORTIC ARCH: There is calcific atherosclerosis of the aortic arch. There is no aneurysm, dissection or hemodynamically significant stenosis of the visualized portion of the aorta. Normal variant aortic arch branching pattern with the brachiocephalic and left common carotid arteries sharing a common origin. The visualized proximal subclavian arteries are widely patent. RIGHT CAROTID SYSTEM: No dissection, occlusion or aneurysm. Mild atherosclerotic calcification at the carotid bifurcation without hemodynamically significant stenosis. LEFT CAROTID SYSTEM: No dissection, occlusion or aneurysm. Mild atherosclerotic calcification at the carotid bifurcation without hemodynamically significant stenosis. VERTEBRAL ARTERIES: Left dominant configuration. Both origins are clearly patent. There is no dissection, occlusion or flow-limiting stenosis to the skull base (V1-V3 segments). CTA HEAD FINDINGS POSTERIOR CIRCULATION: --Vertebral arteries: Normal V4 segments. --Inferior cerebellar arteries: Normal. --Basilar artery: Normal. --Superior cerebellar arteries: Normal. --Posterior cerebral arteries (PCA): Normal. ANTERIOR CIRCULATION: --Intracranial internal carotid arteries: Normal. --Anterior cerebral arteries  (ACA): Normal. Absent left A1 segment, normal variant --Middle cerebral arteries (MCA): Normal. VENOUS SINUSES: As permitted by contrast timing, patent. ANATOMIC VARIANTS: Fetal origin of the right posterior cerebral artery. Absent left ACA A1 segment. Review of the MIP images confirms the above findings. IMPRESSION: 1. No emergent large vessel occlusion or hemodynamically significant stenosis of the head or neck. Aortic Atherosclerosis (ICD10-I70.0).e Electronically Signed   By: Ulyses Jarred M.D.   On: 02/11/2021 19:15      HISTORY OF PRESENT ILLNESS  DAICHI GRAMZA is a 80 y.o. male PMH of CAD, HTN, HLD, DM, mild carotid stenosis, sleep apnea, chronic cough, gastroesophageal reflux disease presents for sudden onset right sided numbness, facial droop and dysarthria. Evaluated in the ED and code stroke was activated and CTH negative for hemorrhage, he was deemed appropriate for IV TPA.  LKW 1500 on 02/11/2021. NIHSS 2    HOSPITAL COURSE On 8/14 CTH no acute abnormality, Aspects 10, no hemorrhage.  CTA H/N no LVO.  Repeat CTH no hemorrhage or evolving stroke MRI- patient unable to tolerate due to claustrophobia 2d Echo EF 50-55%. Left ventricular diastolic parameters are consistent with  Grade I diastolic dysfunction (impaired relaxation).  LDL 54   HGBA1c 6.2%  PT/OT no home health needs  Leukocytosis noted on admission, UA negative, CXR negative for acute process. COVID negative   DISCHARGE EXAM Blood pressure 139/70, pulse (!) 51, temperature 97.9 F (36.6 C), temperature source Oral, resp. rate 18, weight 128 kg, SpO2 92 %.   Physical Exam  Constitutional: Appears well-developed and well-nourished.  Psych: Affect appropriate to situation Eyes: No scleral injection   General: Awake alert in no distress Ht: Normocephalic/atraumatic Lungs: Clear Cardiovascular: Regular rhythm Abdomen nondistended nontender Extremities warm well perfused Neurological exam Alert awake oriented  x3 No dysarthria No aphasia Cranial nerves II to XII intact Motor exam with no deficits-5/5 in all fours Sensory exam with diminished right hemibody light touch sensation in the face arm and leg. Coordination: With no dysmetria Gait testing deferred NIH stroke scale- 1 for sensory  Discharge Diet       Diet   Diet heart healthy/carb modified Room service appropriate? Yes; Fluid consistency: Thin   liquids  DISCHARGE PLAN Disposition:  home aspirin 81 mg daily and clopidogrel 75 mg daily for secondary stroke prevention for 3 weeks then Plavix alone. Ongoing stroke  risk factor control by Primary Care Physician at time of discharge Follow-up PCP [name], MD in 2 weeks. Follow-up in [facility] in 4 weeks, office to schedule an appointment.   45 minutes were spent preparing discharge.  Beulah Gandy, NP  I have personally obtained history,examined this patient, reviewed notes, independently viewed imaging studies, participated in medical decision making and plan of care.ROS completed by me personally and pertinent positives fully documented  I have made any additions or clarifications directly to the above note. Agree with note above.    Antony Contras, MD Medical Director [facility] Pager: [phone] 02/12/2021 4:43 PM

## 2021-02-13 ENCOUNTER — Telehealth: Payer: Self-pay

## 2021-02-13 DIAGNOSIS — H903 Sensorineural hearing loss, bilateral: Secondary | ICD-10-CM | POA: Insufficient documentation

## 2021-02-13 DIAGNOSIS — H905 Unspecified sensorineural hearing loss: Secondary | ICD-10-CM | POA: Insufficient documentation

## 2021-02-13 DIAGNOSIS — K429 Umbilical hernia without obstruction or gangrene: Secondary | ICD-10-CM | POA: Insufficient documentation

## 2021-02-13 DIAGNOSIS — I471 Supraventricular tachycardia, unspecified: Secondary | ICD-10-CM | POA: Insufficient documentation

## 2021-02-13 DIAGNOSIS — E7800 Pure hypercholesterolemia, unspecified: Secondary | ICD-10-CM | POA: Insufficient documentation

## 2021-02-13 DIAGNOSIS — E065 Other chronic thyroiditis: Secondary | ICD-10-CM | POA: Insufficient documentation

## 2021-02-13 NOTE — Telephone Encounter (Signed)
Transition Care Management Follow-up Telephone Call Date of discharge and from where: Cristian Moore 02/12/21 Diagnosis:  CVA How have you been since you were released from the hospital? Tired, but feels fine overall Any questions or concerns? No  Items Reviewed: Did the pt receive and understand the discharge instructions provided? Yes  Medications obtained and verified? Yes  Other? No  Any new allergies since your discharge? No  Dietary orders reviewed? Yes Do you have support at home? Yes   Home Care and Equipment/Supplies: Were home health services ordered? Not that he is aware of, but he is just now reviewing his d/c instructions - he doesn't have any residual effects from the stroke If so, what is the name of the agency? N/a  Has the agency set up a time to come to the patient's home? not applicable Were any new equipment or medical supplies ordered?  No What is the name of the medical supply agency? N/a Were you able to get the supplies/equipment? not applicable Do you have any questions related to the use of the equipment or supplies? No  Functional Questionnaire: (I = Independent and D = Dependent) ADLs: I  Bathing/Dressing- I  Meal Prep- I  Eating- I  Maintaining continence- I  Transferring/Ambulation- I  Managing Meds- I  Follow up appointments reviewed:  PCP Hospital f/u appt confirmed? Yes  Scheduled to see [facility] on 8/19 @ 1:40. [facility] f/u appt confirmed?  Neurologist hasn't called yet, but he is to f/u with them in 4 weeks   Are transportation arrangements needed? No  If their condition worsens, is the pt aware to call PCP or go to the Emergency Dept.? Yes Was the patient provided with contact information for the PCP's office or ED? Yes Was to pt encouraged to call back with questions or concerns? Yes

## 2021-02-16 ENCOUNTER — Encounter: Payer: Self-pay | Admitting: Family Medicine

## 2021-02-16 ENCOUNTER — Ambulatory Visit (INDEPENDENT_AMBULATORY_CARE_PROVIDER_SITE_OTHER): Payer: Medicare HMO | Admitting: Family Medicine

## 2021-02-16 ENCOUNTER — Other Ambulatory Visit: Payer: Self-pay

## 2021-02-16 VITALS — BP 125/65 | HR 68 | Temp 97.9°F | Ht 73.0 in | Wt 278.6 lb

## 2021-02-16 DIAGNOSIS — I10 Essential (primary) hypertension: Secondary | ICD-10-CM

## 2021-02-16 DIAGNOSIS — N400 Enlarged prostate without lower urinary tract symptoms: Secondary | ICD-10-CM

## 2021-02-16 DIAGNOSIS — E118 Type 2 diabetes mellitus with unspecified complications: Secondary | ICD-10-CM

## 2021-02-16 DIAGNOSIS — E039 Hypothyroidism, unspecified: Secondary | ICD-10-CM | POA: Diagnosis not present

## 2021-02-16 DIAGNOSIS — I6930 Unspecified sequelae of cerebral infarction: Secondary | ICD-10-CM | POA: Diagnosis not present

## 2021-02-16 DIAGNOSIS — I2510 Atherosclerotic heart disease of native coronary artery without angina pectoris: Secondary | ICD-10-CM

## 2021-02-16 MED ORDER — METOPROLOL TARTRATE 50 MG PO TABS: 25.0000 mg | ORAL_TABLET | Freq: Two times a day (BID) | ORAL | 3 refills | Status: DC

## 2021-02-16 MED ORDER — SIMVASTATIN 20 MG PO TABS: 20.0000 mg | ORAL_TABLET | Freq: Every day | ORAL | 3 refills | Status: DC

## 2021-02-16 MED ORDER — TAMSULOSIN HCL 0.4 MG PO CAPS: 0.8000 mg | ORAL_CAPSULE | Freq: Every day | ORAL | 1 refills | Status: DC

## 2021-02-16 MED ORDER — ESCITALOPRAM OXALATE 10 MG PO TABS: 10.0000 mg | ORAL_TABLET | Freq: Every day | ORAL | 3 refills | Status: AC

## 2021-02-16 MED ORDER — ONETOUCH VERIO VI STRP: ORAL_STRIP | 3 refills | Status: AC

## 2021-02-16 NOTE — Progress Notes (Signed)
Subjective:  Patient ID: Cristian Moore, male    DOB: 1940/10/27  Age: 80 y.o. MRN: [id number]  CC: Transitions Of Care   HPI Cristian Moore presents for recheck after CVA. Given clot buster on 8/15. Sx resolved of R sided weakness, but still feeling weak. Having some word finding difficulty and dizziness. Having bruising - left eye, arms. [place] out f bed the first night home from the hospital.   Depression screen [facility] 2/9 02/16/2021 02/16/2021 09/19/2020  Decreased Interest 0 0 0  Down, Depressed, Hopeless 1 0 0  PHQ - 2 Score 1 0 0  Altered sleeping 1 - -  Tired, decreased energy 1 - -  Change in appetite 0 - -  Feeling bad or failure about yourself  0 - -  Trouble concentrating 0 - -  Moving slowly or fidgety/restless 1 - -  Suicidal thoughts 0 - -  PHQ-9 Score 4 - -  Difficult doing work/chores Somewhat difficult - -  Some recent data might be hidden    History Odinn has a past medical history of Allergy, Arthritis, Asthma, Benign prostatic hypertrophy, Carotid stenosis, Cataract, Chest pain, Chronic Dyspnea on exertion, Diabetes mellitus without complication ([place]) (123456), Difficulty urinating, Diverticulitis, Diverticulosis, Exogenous obesity, GERD (gastroesophageal reflux disease), H/O blepharoplasty (07/27/2013), H/O hiatal hernia, Hernia, History of adenomatous polyp of colon (1991), Hyperlipidemia, Hypothyroidism (08/23/2019), Laceration ( 01/11/2009 ), Musculoskeletal pain, Obstructive sleep apnea, Rectal bleeding, Sleep apnea, SUPRAVENTRICULAR TACHYCARDIA (08/01/2009), Thyroiditis, Ulcer (2008), and Walking pneumonia.   He has a past surgical history that includes Inguinal hernia repair; Kidney stone surgery; Tonsillectomy; Colonoscopy; Rotator cuff repair; Knee surgery; Elbow bursa surgery; Laparoscopic sigmoid colectomy (07/23/2012); Umbilical hernia repair (07/23/2012); Proctoscopy (07/23/2012); Eyelid Surgery (Bilateral, 05/2013); left heart catheterization with coronary  angiogram (N/A, 02/22/2013); Total hip arthroplasty (Right, 05/27/2018); and Colon surgery (07/2012).   His family history includes Diverticulitis in his daughter and son; Heart disease in his mother; Lung cancer in his maternal uncle; Stroke in his mother.He reports that he quit smoking about 21 years ago. His smoking use included cigars. He has never used smokeless tobacco. He reports that he does not drink alcohol and does not use drugs.    ROS Review of Systems  Constitutional:  Negative for fever.  Respiratory:  Negative for shortness of breath.   Cardiovascular:  Negative for chest pain.  Musculoskeletal:  Negative for arthralgias.  Skin:  Negative for rash.   Objective:  BP 125/65   Pulse 68   Temp 97.9 F (36.6 C)   Ht '6\' 1"'$  (1.854 m)   Wt 278 lb 9.6 oz (126.4 kg)   SpO2 96%   BMI 36.76 kg/m   BP Readings from Last 3 Encounters:  02/19/21 117/65  02/16/21 125/65  02/12/21 139/70    Wt Readings from Last 3 Encounters:  02/19/21 279 lb (126.6 kg)  02/16/21 278 lb 9.6 oz (126.4 kg)  02/11/21 282 lb 3 oz (128 kg)     Physical Exam Vitals reviewed.  Constitutional:      Appearance: He is well-developed.  HENT:     Head: Normocephalic and atraumatic.     Right Ear: External ear normal.     Left Ear: External ear normal.     Mouth/Throat:     Pharynx: No oropharyngeal exudate or posterior oropharyngeal erythema.  Eyes:     Pupils: Pupils are equal, round, and reactive to light.  Cardiovascular:     Rate and Rhythm: Normal rate and regular rhythm.  Heart sounds: No murmur heard. Pulmonary:     Effort: No respiratory distress.     Breath sounds: Normal breath sounds.  Musculoskeletal:     Cervical back: Normal range of motion and neck supple.  Neurological:     Mental Status: He is alert and oriented to person, place, and time.      Assessment & Plan:   [name] was seen today for transitions of care.  Diagnoses and all orders for this visit:  DM  (diabetes mellitus), type 2 with complications ([place])  Hypothyroidism, unspecified type  Late effect of cerebrovascular accident (CVA)  Benign prostatic hyperplasia, unspecified whether lower urinary tract symptoms present -     tamsulosin (FLOMAX) 0.4 MG CAPS capsule; Take 2 capsules (0.8 mg total) by mouth daily after supper.  Non-Obstructive CAD -     metoprolol tartrate (LOPRESSOR) 50 MG tablet; Take 0.5 tablets (25 mg total) by mouth 2 (two) times daily.  Essential hypertension -     metoprolol tartrate (LOPRESSOR) 50 MG tablet; Take 0.5 tablets (25 mg total) by mouth 2 (two) times daily.  Other orders -     glucose blood (ONETOUCH VERIO) test strip; USE TO CHECK BLOOD SUGAR TWICE DAILY AND AS NEEDED -     escitalopram (LEXAPRO) 10 MG tablet; Take 1 tablet (10 mg total) by mouth at bedtime. -     simvastatin (ZOCOR) 20 MG tablet; Take 1 tablet (20 mg total) by mouth at bedtime.      I have discontinued Ryin B. Coggeshall's OneTouch Delica Lancets [id number], AeroChamber MV, and ibuprofen. I have also changed his OneTouch Verio, tamsulosin, escitalopram, and simvastatin. Additionally, I am having him maintain his Vitamin D, Multiple Vitamins-Minerals (CENTRUM SILVER PO), PRESCRIPTION MEDICATION, nitroGLYCERIN, hydroxypropyl methylcellulose / hypromellose, acetaminophen, Krill Oil, albuterol, HYDROcodone-acetaminophen, famotidine, aspirin EC, metFORMIN, sucralfate, montelukast, fluticasone, diclofenac Sodium, zolpidem, clopidogrel, calcium carbonate, and metoprolol tartrate.  Allergies as of 02/16/2021       Reactions   Penicillins Rash, Hives   Over 60 years ago Has patient had a PCN reaction causing immediate rash, facial/tongue/throat swelling, SOB or lightheadedness with hypotension: Unknown Has patient had a PCN reaction causing severe rash involving mucus membranes or skin necrosis: Unknown Has patient had a PCN reaction that required hospitalization: No Has patient had a PCN  reaction occurring within the last 10 years: No If all of the above answers are "NO", then may proceed with Cephalosporin use.        Medication List        Accurate as of February 16, 2021 11:59 PM. If you have any questions, ask your nurse or doctor.          STOP taking these medications    AeroChamber MV inhaler Stopped by: [name], MD   ibuprofen 200 MG tablet Commonly known as: ADVIL Stopped by: [name], MD   OneTouch Delica Lancets [id number] Misc Stopped by: [name], MD       TAKE these medications    acetaminophen 500 MG tablet Commonly known as: TYLENOL Take 1,000 mg by mouth every 6 (six) hours as needed for moderate pain.   albuterol 108 (90 Base) MCG/ACT inhaler Commonly known as: VENTOLIN HFA Inhale 2 puffs into the lungs every 6 (six) hours as needed for wheezing or shortness of breath.   aspirin EC 81 MG tablet Take 81 mg by mouth daily. Swallow whole.   calcium carbonate 1250 (500 Ca) MG chewable tablet Commonly known as: OS-CAL CHEW TWO TABLETS  BY MOUTH DAILY   CENTRUM SILVER PO Take 1 tablet by mouth daily.   clopidogrel 75 MG tablet Commonly known as: PLAVIX Take 1 tablet (75 mg total) by mouth daily.   diclofenac Sodium 1 % Gel Commonly known as: VOLTAREN Apply 2 g topically 4 (four) times daily as needed (shoulders/knees/back pain).   escitalopram 10 MG tablet Commonly known as: LEXAPRO Take 1 tablet (10 mg total) by mouth at bedtime. What changed:  medication strength how much to take how to take this when to take this additional instructions Changed by: [name], MD   famotidine 10 MG tablet Commonly known as: PEPCID Take 10 mg by mouth 2 (two) times daily as needed for heartburn or indigestion.   fluticasone 50 MCG/ACT nasal spray Commonly known as: FLONASE USE 2 SPRAYS IN EACH       NOSTRIL DAILY   HYDROcodone-acetaminophen 5-325 MG tablet Commonly known as: NORCO/VICODIN Take 0.5 tablets by mouth  every 6 (six) hours as needed for moderate pain.   hydroxypropyl methylcellulose / hypromellose 2.5 % ophthalmic solution Commonly known as: ISOPTO TEARS / GONIOVISC Place 1 drop into both eyes 4 (four) times daily as needed for dry eyes.   Krill Oil 350 MG Caps Take 350 mg by mouth daily.   metFORMIN 500 MG tablet Commonly known as: GLUCOPHAGE TAKE 1 TABLET DAILY WITH   BREAKFAST   metoprolol tartrate 50 MG tablet Commonly known as: LOPRESSOR Take 0.5 tablets (25 mg total) by mouth 2 (two) times daily.   montelukast 10 MG tablet Commonly known as: SINGULAIR Take 1 tablet (10 mg total) by mouth at bedtime.   nitroGLYCERIN 0.4 MG SL tablet Commonly known as: Nitrostat DISSOLVE ONE TABLET UNDER THE TONGUE EVERY 5 MINUTES AS NEEDED FOR CHEST PAIN.  DO NOT EXCEED A TOTAL OF 3 DOSES IN 15 MINUTES What changed:  how much to take how to take this when to take this reasons to take this additional instructions   OneTouch Verio test strip Generic drug: glucose blood USE TO CHECK BLOOD SUGAR TWICE DAILY AND AS NEEDED   PRESCRIPTION MEDICATION Inhale 1 application into the lungs at bedtime. "CPAP" MACHINE   simvastatin 20 MG tablet Commonly known as: ZOCOR Take 1 tablet (20 mg total) by mouth at bedtime. What changed: when to take this   sucralfate 1 g tablet Commonly known as: CARAFATE TAKE 1 TABLET DAILY AS     NEEDED FOR STOMACH COATING What changed: See the new instructions.   tamsulosin 0.4 MG Caps capsule Commonly known as: FLOMAX Take 2 capsules (0.8 mg total) by mouth daily after supper. What changed: See the new instructions. Changed by: [name], MD   Vitamin D 50 MCG (2000 UT) tablet Take 2,000 Units by mouth daily.   zolpidem 10 MG tablet Commonly known as: AMBIEN Take 1 tablet (10 mg total) by mouth at bedtime as needed for sleep.         Follow-up: Return in about 3 months (around 05/19/2021).  [name], M.D.

## 2021-02-19 ENCOUNTER — Ambulatory Visit: Payer: Medicare HMO | Admitting: Neurology

## 2021-02-19 ENCOUNTER — Encounter: Payer: Self-pay | Admitting: Neurology

## 2021-02-19 ENCOUNTER — Other Ambulatory Visit: Payer: Self-pay

## 2021-02-19 VITALS — BP 117/65 | HR 68 | Ht 73.0 in | Wt 279.0 lb

## 2021-02-19 DIAGNOSIS — R202 Paresthesia of skin: Secondary | ICD-10-CM

## 2021-02-19 DIAGNOSIS — G3184 Mild cognitive impairment, so stated: Secondary | ICD-10-CM

## 2021-02-19 DIAGNOSIS — I6381 Other cerebral infarction due to occlusion or stenosis of small artery: Secondary | ICD-10-CM

## 2021-02-19 DIAGNOSIS — R200 Anesthesia of skin: Secondary | ICD-10-CM | POA: Diagnosis not present

## 2021-02-19 NOTE — Patient Instructions (Addendum)
I had a long d/w patient about his recent stroke, risk for recurrent stroke/TIAs, personally independently reviewed imaging studies and stroke evaluation results and answered questions.Continue aspirin 81 mg daily and clopidogrel 75 mg daily for 2 more weeks and then stop clopidogrel and stay on aspirin alone for secondary stroke prevention and maintain strict control of hypertension with blood pressure goal below 130/90, diabetes with hemoglobin A1c goal below 6.5% and lipids with LDL cholesterol goal below 70 mg/dL. I also advised the patient to eat a healthy diet with plenty of whole grains, cereals, fruits and vegetables, exercise regularly and maintain ideal body weight .I also encouraged the patient to increase participation in cognitively challenging activities like solving crossword puzzles, playing bridge and sodoku.  We also discussed memory compensation strategies.  Followup in the future with my nurse practitioner Janett Billow in 4 months or call earlier if necessary. Memory Compensation Strategies  Use "WARM" strategy.  W= write it down  A= associate it  R= repeat it  M= make a mental note  2.   You can keep a Social worker.  Use a 3-ring notebook with sections for the following: calendar, important names and phone numbers,  medications, doctors' names/phone numbers, lists/reminders, and a section to journal what you did  each day.   3.    Use a calendar to write appointments down.  4.    Write yourself a schedule for the day.  This can be placed on the calendar or in a separate section of the Memory Notebook.  Keeping a  regular schedule can help memory.  5.    Use medication organizer with sections for each day or morning/evening pills.  You may need help loading it  6.    Keep a basket, or pegboard by the door.  Place items that you need to take out with you in the basket or on the pegboard.  You may also want to  include a message board for reminders.  7.    Use sticky  notes.  Place sticky notes with reminders in a place where the task is performed.  For example: " turn off the  stove" placed by the stove, "lock the door" placed on the door at eye level, " take your medications" on  the bathroom mirror or by the place where you normally take your medications.  8.    Use alarms/timers.  Use while cooking to remind yourself to check on food or as a reminder to take your medicine, or as a  reminder to make a call, or as a reminder to perform another task, etc.

## 2021-02-19 NOTE — Progress Notes (Signed)
[facility] [address] [place]. [place] [address] [phone]       OFFICE FOLLOW-UP NOTE  [name] Date of Birth:  January 19, 1941 Medical Record Number:  [id number]   HPI: [name] is a 80 year old Caucasian male seen today for initial office follow-up visit following hospital admission for possible stroke and [place] 2022.  Is accompanied by his wife.  History is obtained from them, review of electronic medical records and I personally reviewed pertinent imaging films in PACS.  He has past medical history of hypertension, hyperlipidemia, coronary artery disease, diabetes, obesity, sleep apnea, gastroesophageal reflux disease and chronic cough.  He presented on 02/11/2021 with sudden onset of right sided numbness, facial droop and dysarthria.  NIH stroke scale was 2 on admission.  CT scan of the head was unremarkable with aspect score of 10.  CT angiogram of the brain and neck but did not reveal significant extracranial intracranial stenosis.  Patient was given IV tPA after discussion of risk-benefit.  His symptoms resolved shortly after tPA administration.  He had close neurological observation with strict control of blood pressure as per post tPA protocol.  2D echo showed ejection fraction 50 to [id number] grade 1 diastolic dysfunction.  LDL cholesterol was 54 mg percent and hemoglobin A1c was 6.2%.  Telemetry monitoring did not reveal cardiac arrhythmias.  Patient did well and was discharged home and advised to take dual antiplatelet therapy of aspirin and Plavix for 3 weeks followed by aspirin alone.  He is tolerating this well but does complain of bruising.  He fell out of bed 1 night and has a left periorbital bruise.  He states his blood pressures well controlled and today it is 117/65.  He has new complaint of short-term memory difficulties which he admits to existed even before his stroke.  He can remember things later but not in the spur of the moment.  He finds this  frustrating.  He does take Krill oil every day but does not participate in mentally challenging activities on a regular basis.  ROS:   14 system review of systems is positive for numbness, slurred speech, facial droop, tiredness, decreased stamina, bruising, memory difficulties all other systems negative  PMH:  Past Medical History:  Diagnosis Date   Allergy    Arthritis    Asthma    Benign prostatic hypertrophy    Carotid stenosis    Carotid US (07/2013):  Bilateral 1-39% ICA (f/u 2 yrs)   Cataract    mild   Chest pain    a. 01/2013 Cath: LM nl, LAD 25p, D1 25p, LCX nl, RCA 25p, PDA nl, RPL nl, EF 55%;  b. 03/2015 MV: EF 59%, no ischemia; c. 03/2016 MV: basal inf/mid inf defect, likely artifact, EF 48%, low risk.   Chronic Dyspnea on exertion    a. See cardiac eval under CAD heading;  b. 03/2015 Echo: EF 50-55%, gr1 DD, triv AI;  b. 03/2015 CTA Chest: No PE;  c. 05/2016 CPX: no significant cardi-pulmonary limitation noted. Suspect obesity contributing to exertional symptoms.   Diabetes mellitus without complication ([place]) 123456   Difficulty urinating    Diverticulitis    Diverticulosis    Exogenous obesity    GERD (gastroesophageal reflux disease)    moe like heart burn   H/O blepharoplasty 07/27/2013   H/O hiatal hernia    Hernia    Umbulical    History of adenomatous polyp of colon 1991   Hyperlipidemia    Hypothyroidism 08/23/2019  Laceration  01/11/2009    Deep laceration left index finger dorsally   Musculoskeletal pain    in the right shoulder - S/P ROTATOR CUFF REPAIR-BUT STILL HAS PAIN   Obstructive sleep apnea     CPAP    AUTO SET 6 TO 20 - USUALLY SETTLES OUT AT 10   Rectal bleeding    PT ATTRIBUTES TO HEMORRHOIDS   Sleep apnea    wears cpap    SUPRAVENTRICULAR TACHYCARDIA 08/01/2009   Qualifier: Diagnosis of  By: Lovette Cliche, CNA, Christy     Thyroiditis    Ulcer 2008   Walking pneumonia     Social History:  Social History   Socioeconomic History   Marital status:  Married    Spouse name: Cristian Moore   Number of children: 4   Years of education: 13   Highest education level: Some college, no degree  Occupational History   Occupation: Retired  Tobacco Use   Smoking status: Former    Types: Cigars    Quit date: 08/12/1999    Years since quitting: 21.5   Smokeless tobacco: Never   Tobacco comments:    smoked cigars on and off for approx 4-5 yrs  Vaping Use   Vaping Use: Never used  Substance and Sexual Activity   Alcohol use: No   Drug use: No   Sexual activity: Yes  Other Topics Concern   Not on file  Social History Narrative   Lives at home with wife      [facility] (09/16/16):   Originally from [facility]. Previously served & lived in [place], [place], & [place]. 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 He also worked in [employer]. No pets currently. Remote bird exposure. No mold or hot tub exposure. Enjoys playing banjo as well as hunting. He also raises cattle. Has carpet in his bedroom. No feather bedding. No indoor plants.    Social Determinants of Health   Financial Resource Strain: Low Risk    Difficulty of Paying Living Expenses: Not hard at all  Food Insecurity: No Food Insecurity   Worried About Charity fundraiser in the Last Year: Never true   [place] in the Last Year: Never true  Transportation Needs: No Transportation Needs   Lack of Transportation (Medical): No   Lack of Transportation (Non-Medical): No  Physical Activity: Not on file  Stress: Not on file  Social Connections: Not on file  Intimate Partner Violence: Not on file    Medications:   Current Outpatient Medications on File Prior to Visit  Medication Sig Dispense Refill   acetaminophen (TYLENOL) 500 MG tablet Take 1,000 mg by mouth every 6 (six) hours as needed for moderate pain.     albuterol (VENTOLIN HFA) 108 (90 Base) MCG/ACT inhaler Inhale 2 puffs into the lungs every 6 (six) hours as needed  for wheezing or shortness of breath. 18 g 3   aspirin EC 81 MG tablet Take 81 mg by mouth daily. Swallow whole.     calcium carbonate (OS-CAL) 1250 (500 Ca) MG chewable tablet CHEW TWO TABLETS BY MOUTH DAILY     Cholecalciferol (VITAMIN D) 50 MCG (2000 UT) tablet Take 2,000 Units by mouth daily.     clopidogrel (PLAVIX) 75 MG tablet Take 1 tablet (75 mg total) by mouth daily. 30 tablet 0   diclofenac Sodium (VOLTAREN) 1 % GEL Apply 2 g topically 4 (  four) times daily as needed (shoulders/knees/back pain).     escitalopram (LEXAPRO) 10 MG tablet Take 1 tablet (10 mg total) by mouth at bedtime. 90 tablet 3   famotidine (PEPCID) 10 MG tablet Take 10 mg by mouth 2 (two) times daily as needed for heartburn or indigestion.     fluticasone (FLONASE) 50 MCG/ACT nasal spray USE 2 SPRAYS IN EACH       NOSTRIL DAILY (Patient taking differently: Place 2 sprays into both nostrils daily.) 48 g 2   glucose blood (ONETOUCH VERIO) test strip USE TO CHECK BLOOD SUGAR TWICE DAILY AND AS NEEDED 200 each 3   HYDROcodone-acetaminophen (NORCO/VICODIN) 5-325 MG tablet Take 0.5 tablets by mouth every 6 (six) hours as needed for moderate pain.     hydroxypropyl methylcellulose / hypromellose (ISOPTO TEARS / GONIOVISC) 2.5 % ophthalmic solution Place 1 drop into both eyes 4 (four) times daily as needed for dry eyes.     Krill Oil 350 MG CAPS Take 350 mg by mouth daily.     metFORMIN (GLUCOPHAGE) 500 MG tablet TAKE 1 TABLET DAILY WITH   BREAKFAST (Patient taking differently: Take 500 mg by mouth daily with breakfast.) 90 tablet 1   metoprolol tartrate (LOPRESSOR) 50 MG tablet Take 0.5 tablets (25 mg total) by mouth 2 (two) times daily. 90 tablet 3   montelukast (SINGULAIR) 10 MG tablet Take 1 tablet (10 mg total) by mouth at bedtime. 90 tablet 1   Multiple Vitamins-Minerals (CENTRUM SILVER PO) Take 1 tablet by mouth daily.     nitroGLYCERIN (NITROSTAT) 0.4 MG SL tablet DISSOLVE ONE TABLET UNDER THE TONGUE EVERY 5 MINUTES AS  NEEDED FOR CHEST PAIN.  DO NOT EXCEED A TOTAL OF 3 DOSES IN 15 MINUTES (Patient taking differently: Place 0.4 mg under the tongue every 5 (five) minutes as needed for chest pain.) 25 tablet 11   PRESCRIPTION MEDICATION Inhale 1 application into the lungs at bedtime. "CPAP" MACHINE     simvastatin (ZOCOR) 20 MG tablet Take 1 tablet (20 mg total) by mouth at bedtime. 90 tablet 3   sucralfate (CARAFATE) 1 g tablet TAKE 1 TABLET DAILY AS     NEEDED FOR STOMACH COATING (Patient taking differently: Take 1 g by mouth daily as needed (stomach pain).) 90 tablet 1   tamsulosin (FLOMAX) 0.4 MG CAPS capsule Take 2 capsules (0.8 mg total) by mouth daily after supper. 180 capsule 1   zolpidem (AMBIEN) 10 MG tablet Take 1 tablet (10 mg total) by mouth at bedtime as needed for sleep. 30 tablet 0   No current facility-administered medications on file prior to visit.    Allergies:   Allergies  Allergen Reactions   Penicillins Rash and Hives    Over 60 years ago Has patient had a PCN reaction causing immediate rash, facial/tongue/throat swelling, SOB or lightheadedness with hypotension: Unknown Has patient had a PCN reaction causing severe rash involving mucus membranes or skin necrosis: Unknown Has patient had a PCN reaction that required hospitalization: No Has patient had a PCN reaction occurring within the last 10 years: No If all of the above answers are "NO", then may proceed with Cephalosporin use.     Physical Exam General: Obese elderly Caucasian male, seated, in no evident distress Head: head normocephalic and atraumatic.  Neck: supple with no carotid or supraclavicular bruits Cardiovascular: regular rate and rhythm, no murmurs Musculoskeletal: no deformity Skin:  no rash/petichiae .left periorbital bruise Vascular:  Normal pulses all extremities Vitals:   02/19/21 1436  BP: 117/65  Pulse: 68   Neurologic Exam Mental Status: Awake and fully alert. Oriented to place and time. Recent and  remote memory intact. Attention span, concentration and fund of knowledge appropriate. Mood and affect appropriate.  Cranial Nerves: Fundoscopic exam reveals sharp disc margins. Pupils equal, briskly reactive to light. Extraocular movements full without nystagmus. Visual fields full to confrontation. Hearing intact. Facial sensation intact. Face, tongue, palate moves normally and symmetrically.  Motor: Normal bulk and tone. Normal strength in all tested extremity muscles. Sensory.: intact to touch ,pinprick .position and vibratory sensation.  Coordination: Rapid alternating movements normal in all extremities. Finger-to-nose and heel-to-shin performed accurately bilaterally. Gait and Station: Arises from chair without difficulty. Stance is normal. Gait demonstrates normal stride length and balance .  Unable to heel, toe and tandem walk without difficulty.  Reflexes: 1+ and symmetric. Toes downgoing.   NIHSS  0 Modified Rankin  1   ASSESSMENT: 80 year old Caucasian male with sudden onset of facial droop and right sided numbness likely due to small left subcortical lacunar stroke not seen on CT scan x2 treated with IV tPA with recovery.  Patient unable to tolerate MRI.  Vascular risk factors of diabetes, hypertension, obesity and hyperlipidemia He also has mild age-related memory impairment.    PLAN: I had a long d/w patient and his wife about his recent stroke, risk for recurrent stroke/TIAs, personally independently reviewed imaging studies and stroke evaluation results and answered questions.Continue aspirin 81 mg daily and clopidogrel 75 mg daily for 2 more weeks and then stop clopidogrel and stay on aspirin alone for secondary stroke prevention and maintain strict control of hypertension with blood pressure goal below 130/90, diabetes with hemoglobin A1c goal below 6.5% and lipids with LDL cholesterol goal below 70 mg/dL. I also advised the patient to eat a healthy diet with plenty of whole  grains, cereals, fruits and vegetables, exercise regularly and maintain ideal body weight .I also encouraged the patient to increase participation in cognitively challenging activities like solving crossword puzzles, playing bridge and sodoku.  We also discussed memory compensation strategies.  Followup in the future with my nurse practitioner Janett Billow in 4 months or call earlier if necessary. Greater than 50% of time during this 35 minute visit was spent on counseling,explanation of diagnosis of lacunar stroke, planning of further management, discussion with patient and family and coordination of care Antony Contras, MD Note: This document was prepared with digital dictation and possible smart phrase technology. Any transcriptional errors that result from this process are unintentional

## 2021-02-26 ENCOUNTER — Ambulatory Visit: Payer: Medicare HMO | Admitting: Family Medicine

## 2021-03-09 DIAGNOSIS — M170 Bilateral primary osteoarthritis of knee: Secondary | ICD-10-CM | POA: Diagnosis not present

## 2021-03-10 ENCOUNTER — Emergency Department (HOSPITAL_COMMUNITY)
Admission: EM | Admit: 2021-03-10 | Discharge: 2021-03-10 | Disposition: A | Discharge status: Home / Self Care | Payer: No Typology Code available for payment source | Attending: Emergency Medicine | Admitting: Emergency Medicine

## 2021-03-10 ENCOUNTER — Emergency Department (HOSPITAL_COMMUNITY): Payer: No Typology Code available for payment source

## 2021-03-10 ENCOUNTER — Encounter (HOSPITAL_COMMUNITY): Payer: Self-pay

## 2021-03-10 ENCOUNTER — Other Ambulatory Visit: Payer: Self-pay

## 2021-03-10 DIAGNOSIS — Z7902 Long term (current) use of antithrombotics/antiplatelets: Secondary | ICD-10-CM | POA: Insufficient documentation

## 2021-03-10 DIAGNOSIS — Z96641 Presence of right artificial hip joint: Secondary | ICD-10-CM | POA: Diagnosis not present

## 2021-03-10 DIAGNOSIS — R0789 Other chest pain: Secondary | ICD-10-CM | POA: Insufficient documentation

## 2021-03-10 DIAGNOSIS — Z87891 Personal history of nicotine dependence: Secondary | ICD-10-CM | POA: Diagnosis not present

## 2021-03-10 DIAGNOSIS — E039 Hypothyroidism, unspecified: Secondary | ICD-10-CM | POA: Diagnosis not present

## 2021-03-10 DIAGNOSIS — Z7982 Long term (current) use of aspirin: Secondary | ICD-10-CM | POA: Diagnosis not present

## 2021-03-10 DIAGNOSIS — Z79899 Other long term (current) drug therapy: Secondary | ICD-10-CM | POA: Diagnosis not present

## 2021-03-10 DIAGNOSIS — J45909 Unspecified asthma, uncomplicated: Secondary | ICD-10-CM | POA: Diagnosis not present

## 2021-03-10 DIAGNOSIS — I10 Essential (primary) hypertension: Secondary | ICD-10-CM | POA: Insufficient documentation

## 2021-03-10 DIAGNOSIS — M25511 Pain in right shoulder: Secondary | ICD-10-CM | POA: Insufficient documentation

## 2021-03-10 DIAGNOSIS — E119 Type 2 diabetes mellitus without complications: Secondary | ICD-10-CM | POA: Diagnosis not present

## 2021-03-10 DIAGNOSIS — W010XXA Fall on same level from slipping, tripping and stumbling without subsequent striking against object, initial encounter: Secondary | ICD-10-CM | POA: Insufficient documentation

## 2021-03-10 DIAGNOSIS — Z7984 Long term (current) use of oral hypoglycemic drugs: Secondary | ICD-10-CM | POA: Diagnosis not present

## 2021-03-10 DIAGNOSIS — Y9301 Activity, walking, marching and hiking: Secondary | ICD-10-CM | POA: Diagnosis not present

## 2021-03-10 HISTORY — DX: Cerebral infarction, unspecified: I63.9

## 2021-03-10 MED ORDER — NAPROXEN 500 MG PO TABS: 500.0000 mg | ORAL_TABLET | Freq: Two times a day (BID) | ORAL | 0 refills | Status: DC | PRN

## 2021-03-10 NOTE — ED Triage Notes (Signed)
Pt reports tripped and fell last night.  C/O r shoulder pain. Reports hurts to raise arm.

## 2021-03-10 NOTE — ED Provider Notes (Signed)
[facility] Provider Note   CSN: [id number] Arrival date & time: 03/10/21  0941     History Chief Complaint  Patient presents with   Shoulder Pain    Cristian Moore is a 80 y.o. male.  with past medical history of recent stroke status post tPA administration on 02/11/21 with residual right-sided deficits that have returned to baseline, who presents to the emergency department with right shoulder pain.  States that last night he was ambulating from the living room into his bathroom when he tripped over an unknown object and fell and landed on his right shoulder.  Denies chest pain, palpitations, dizziness or lightheadedness prior to the fall.  Denies hitting his head or loss of consciousness.  States that he has a cane at home, but typically does not use this.  Of note he has previous rotator cuff surgery on the same arm and given steroids and muscle relaxants at that time.   Shoulder Pain Associated symptoms: no neck pain       Past Medical History:  Diagnosis Date   Allergy    Arthritis    Asthma    Benign prostatic hypertrophy    Carotid stenosis    Carotid US (07/2013):  Bilateral 1-39% ICA (f/u 2 yrs)   Cataract    mild   Chest pain    a. 01/2013 Cath: LM nl, LAD 25p, D1 25p, LCX nl, RCA 25p, PDA nl, RPL nl, EF 55%;  b. 03/2015 MV: EF 59%, no ischemia; c. 03/2016 MV: basal inf/mid inf defect, likely artifact, EF 48%, low risk.   Chronic Dyspnea on exertion    a. See cardiac eval under CAD heading;  b. 03/2015 Echo: EF 50-55%, gr1 DD, triv AI;  b. 03/2015 CTA Chest: No PE;  c. 05/2016 CPX: no significant cardi-pulmonary limitation noted. Suspect obesity contributing to exertional symptoms.   Diabetes mellitus without complication ([place]) [id number]   Difficulty urinating    Diverticulitis    Diverticulosis    Exogenous obesity    GERD (gastroesophageal reflux disease)    moe like heart burn   H/O blepharoplasty 07/27/2013   H/O hiatal hernia    Hernia     Umbulical    History of adenomatous polyp of colon 1991   Hyperlipidemia    Hypothyroidism 08/23/2019   Laceration 01/11/2009   Deep laceration left index finger dorsally   Musculoskeletal pain    in the right shoulder - S/P ROTATOR CUFF REPAIR-BUT STILL HAS PAIN   Obstructive sleep apnea     CPAP    AUTO SET 6 TO 20 - USUALLY SETTLES OUT AT 10   Rectal bleeding    PT ATTRIBUTES TO HEMORRHOIDS   Sleep apnea    wears cpap    Stroke ([place])    SUPRAVENTRICULAR TACHYCARDIA 08/01/2009   Qualifier: Diagnosis of  By: Lovette Cliche, CNA, [place]     Thyroiditis    Ulcer 2008   Walking pneumonia     Patient Active Problem List   Diagnosis Date Noted   Chronic thyroiditis 02/13/2021   Paroxysmal supraventricular tachycardia ([place]) 02/13/2021   Pure hypercholesterolemia 02/13/2021   Sensorineural hearing loss [id number]   Umbilical hernia [id number]   CVA (cerebral vascular accident) ([place]) 02/11/2021   Irritable bowel syndrome with both constipation and diarrhea 08/01/2020   Abdominal obesity 08/01/2020   Subacute frontal sinusitis 07/21/2020   Low back pain 12/23/2019   Bilateral carotid artery stenosis 05/10/2019   Pain in both wrists 01/19/2019  Bilateral hand pain 12/10/2018   Bilateral leg and foot pain 12/10/2018   OA (osteoarthritis) of hip 05/27/2018   Pulmonary emphysema ([place]) 10/30/2017   Aortic atherosclerosis ([place]) 10/30/2017   Hardening of the aorta (main artery of the heart) ([place]) 10/30/2017   Degeneration of lumbar intervertebral disc 09/30/2017   Lumbar radiculopathy 09/23/2017   Chest tightness 03/19/2015   Insomnia 07/19/2014   Vitamin D deficiency 07/19/2014   BPH (benign prostatic hyperplasia) 07/19/2014   DM (diabetes mellitus), type 2 with complications ([place]) [id number]   Personal history of other diseases of circulatory system 06/03/2013   History of PSVT (paroxysmal supraventricular tachycardia) 06/03/2013   GERD (gastroesophageal reflux disease) 06/03/2013    Hiatal hernia 06/03/2013   Peripheral visual field defect of both eyes 05/18/2013   Peripheral visual field defect 05/18/2013   Non-Obstructive CAD 03/05/2013   Essential hypertension 02/22/2013   Shortness of breath    Obstructive sleep apnea    Carotid artery stenosis 09/28/2009   Obesity (BMI 30-39.9) 08/02/2009   HLD (hyperlipidemia) 08/01/2009    Past Surgical History:  Procedure Laterality Date   COLON SURGERY  07/2012   COLONOSCOPY     for polyps   ELBOW BURSA SURGERY     Eyelid Surgery Bilateral 05/2013   INGUINAL HERNIA REPAIR     KIDNEY STONE SURGERY     KNEE SURGERY     meniscus right knee   LAPAROSCOPIC SIGMOID COLECTOMY  07/23/2012   Procedure: LAPAROSCOPIC SIGMOID COLECTOMY;  Surgeon: [name], MD;  Location: [facility];  Service: General;  Laterality: N/A;  Laparoscopic Sigmoid Colectomy   LEFT HEART CATHETERIZATION WITH CORONARY ANGIOGRAM N/A 02/22/2013   Procedure: LEFT HEART CATHETERIZATION WITH CORONARY ANGIOGRAM;  Surgeon: [name], MD;  Location: [facility] CATH LAB;  Service: Cardiovascular;  Laterality: N/A;   PROCTOSCOPY  07/23/2012   Procedure: PROCTOSCOPY;  Surgeon: [name], MD;  Location: [facility];  Service: General;  Laterality: N/A;  Rigid Proctoscopy   ROTATOR CUFF REPAIR     TONSILLECTOMY     TOTAL HIP ARTHROPLASTY Right 05/27/2018   Procedure: RIGHT TOTAL HIP ARTHROPLASTY ANTERIOR APPROACH;  Surgeon: [name], MD;  Location: [facility];  Service: Orthopedics;  Laterality: Right;  [id number]   UMBILICAL HERNIA REPAIR  07/23/2012   Procedure: HERNIA REPAIR UMBILICAL ADULT;  Surgeon: [name], MD;  Location: [facility];  Service: General;  Laterality: N/A;  Primary Umbilical Hernia Repair       Family History  Problem Relation Age of Onset   Stroke Mother    Heart disease Mother        Valvular heart disease, Rheumatic fever   Lung cancer Maternal Uncle    Diverticulitis Son    Diverticulitis Daughter    Colon cancer Neg Hx    Colon  polyps Neg Hx    Esophageal cancer Neg Hx    Rectal cancer Neg Hx    Stomach cancer Neg Hx     Social History   Tobacco Use   Smoking status: Former    Types: Cigars    Quit date: 08/12/1999    Years since quitting: 21.5   Smokeless tobacco: Never   Tobacco comments:    smoked cigars on and off for approx 4-5 yrs  Vaping Use   Vaping Use: Never used  Substance Use Topics   Alcohol use: No   Drug use: No    Home Medications Prior to Admission medications   Medication Sig Start Date End Date  Taking? Authorizing Provider  acetaminophen (TYLENOL) 500 MG tablet Take 1,000 mg by mouth every 6 (six) hours as needed for moderate pain.    [provider]  albuterol (VENTOLIN HFA) 108 (90 Base) MCG/ACT inhaler Inhale 2 puffs into the lungs every 6 (six) hours as needed for wheezing or shortness of breath. 04/06/19   [name], MD  aspirin EC 81 MG tablet Take 81 mg by mouth daily. Swallow whole.    [provider]  calcium carbonate (OS-CAL) 1250 (500 Ca) MG chewable tablet CHEW TWO TABLETS BY MOUTH DAILY 05/27/08   [provider]  Cholecalciferol (VITAMIN D) 50 MCG (2000 UT) tablet Take 2,000 Units by mouth daily.    [provider]  clopidogrel (PLAVIX) 75 MG tablet Take 1 tablet (75 mg total) by mouth daily. 02/12/21   August Albino, NP  diclofenac Sodium (VOLTAREN) 1 % GEL Apply 2 g topically 4 (four) times daily as needed (shoulders/knees/back pain).    [provider]  escitalopram (LEXAPRO) 10 MG tablet Take 1 tablet (10 mg total) by mouth at bedtime. 02/16/21   [name], MD  famotidine (PEPCID) 10 MG tablet Take 10 mg by mouth 2 (two) times daily as needed for heartburn or indigestion.    [provider]  fluticasone (FLONASE) 50 MCG/ACT nasal spray USE 2 SPRAYS IN EACH       NOSTRIL DAILY Patient taking differently: Place 2 sprays into both nostrils daily. 11/28/20   [name], MD  glucose blood (ONETOUCH VERIO)  test strip USE TO CHECK BLOOD SUGAR TWICE DAILY AND AS NEEDED 02/16/21   [name], MD  HYDROcodone-acetaminophen (NORCO/VICODIN) 5-325 MG tablet Take 0.5 tablets by mouth every 6 (six) hours as needed for moderate pain.    [provider]  hydroxypropyl methylcellulose / hypromellose (ISOPTO TEARS / GONIOVISC) 2.5 % ophthalmic solution Place 1 drop into both eyes 4 (four) times daily as needed for dry eyes.    [provider]  [name] Oil 350 MG CAPS Take 350 mg by mouth daily.    [provider]  metFORMIN (GLUCOPHAGE) 500 MG tablet TAKE 1 TABLET DAILY WITH   BREAKFAST Patient taking differently: Take 500 mg by mouth daily with breakfast. 10/19/20   [name], MD  metoprolol tartrate (LOPRESSOR) 50 MG tablet Take 0.5 tablets (25 mg total) by mouth 2 (two) times daily. 02/16/21   [name], MD  montelukast (SINGULAIR) 10 MG tablet Take 1 tablet (10 mg total) by mouth at bedtime. 11/10/20   [name], MD  Multiple Vitamins-Minerals (CENTRUM SILVER PO) Take 1 tablet by mouth daily.    [provider]  nitroGLYCERIN (NITROSTAT) 0.4 MG SL tablet DISSOLVE ONE TABLET UNDER THE TONGUE EVERY 5 MINUTES AS NEEDED FOR CHEST PAIN.  DO NOT EXCEED A TOTAL OF 3 DOSES IN 15 MINUTES Patient taking differently: Place 0.4 mg under the tongue every 5 (five) minutes as needed for chest pain. 04/03/16   Barrett, Evelene Croon, PA-C  PRESCRIPTION MEDICATION Inhale 1 application into the lungs at bedtime. "CPAP" MACHINE    [provider]  simvastatin (ZOCOR) 20 MG tablet Take 1 tablet (20 mg total) by mouth at bedtime. 02/16/21   [name], MD  sucralfate (CARAFATE) 1 g tablet TAKE 1 TABLET DAILY AS     NEEDED FOR STOMACH COATING Patient taking differently: Take 1 g by mouth daily as needed (stomach pain). 10/19/20   [name], MD  tamsulosin (FLOMAX) 0.4 MG CAPS capsule Take  2 capsules (0.8 mg total) by mouth daily after supper. 02/16/21   [name], MD   zolpidem (AMBIEN) 10 MG tablet Take 1 tablet (10 mg total) by mouth at bedtime as needed for sleep. 02/12/21   August Albino, NP    Allergies    Penicillins  Review of Systems   Review of Systems  Musculoskeletal:  Positive for arthralgias and myalgias. Negative for neck pain and neck stiffness.  Neurological:  Negative for dizziness and light-headedness.  All other systems reviewed and are negative.  Physical Exam Updated Vital Signs BP 137/73 (BP Location: Left Arm)   Pulse 76   Temp 98.3 F (36.8 C) (Oral)   Resp 18   Ht '6\' 1"'$  (1.854 m)   Wt 126.6 kg   SpO2 99%   BMI 36.81 kg/m   Physical Exam Vitals and nursing note reviewed.  Constitutional:      Appearance: Normal appearance.  HENT:     Head: Normocephalic and atraumatic.  Eyes:     General: No scleral icterus. Pulmonary:     Effort: Pulmonary effort is normal. No respiratory distress.  Musculoskeletal:        General: Normal range of motion.     Right shoulder: Tenderness present. Normal range of motion. Normal strength.     Left shoulder: Normal.       Arms:     Cervical back: Full passive range of motion without pain, normal range of motion and neck supple. No pain with movement, spinous process tenderness or muscular tenderness.     Comments: Tenderness to palpation of the right axillary region and right chest wall.  No tenderness over the humerus or shoulder.  Has full passive range of motion with mild tenderness on flexion of the right shoulder.  Skin:    Capillary Refill: Capillary refill takes less than 2 seconds.     Findings: No rash.  Neurological:     General: No focal deficit present.     Mental Status: He is alert and oriented to person, place, and time. Mental status is at baseline.  Psychiatric:        Mood and Affect: Mood normal.        Behavior: Behavior normal.        Thought Content: Thought content normal.        Judgment: Judgment normal.    ED Results / Procedures / Treatments    Labs (all labs ordered are listed, but only abnormal results are displayed) Labs Reviewed - No data to display  EKG None  Radiology DG Ribs Unilateral W/Chest Right  Result Date: 03/10/2021 CLINICAL DATA:  Status post fall last night with right rib pain. EXAM: RIGHT RIBS AND CHEST - 3+ VIEW COMPARISON:  February 11, 2021 FINDINGS: No fracture or other bone lesions are seen involving the ribs. There is no evidence of pneumothorax or pleural effusion. Mild pectus is of left lung base is identified. Heart size and mediastinal contours are within normal limits. IMPRESSION: No acute fracture or dislocation of right ribs. Mild pectus of left lung base. Electronically Signed   By: Abelardo Diesel M.D.   On: 03/10/2021 11:16   DG Shoulder Right  Result Date: 03/10/2021 CLINICAL DATA:  Status post fall with right shoulder pain. EXAM: RIGHT SHOULDER - 2+ VIEW COMPARISON:  None. FINDINGS: There is no evidence of fracture or dislocation. There is no evidence of arthropathy or other focal bone abnormality. Soft tissues are unremarkable. IMPRESSION: Negative. Electronically Signed  By: Abelardo Diesel M.D.   On: 03/10/2021 10:42    Procedures Procedures   Medications Ordered in ED Medications - No data to display  ED Course  I have reviewed the triage vital signs and the nursing notes.  Pertinent labs & imaging results that were available during my care of the patient were reviewed by me and considered in my medical decision making (see chart for details).  MDM Rules/Calculators/A&P Cristian Moore is an 80 year old male who presents to the emergency department after a fall last night.  Given history exam and imaging there is no concern for fracture, dislocation, significant ligamentous injury.  He is neurovascularly intact.  Given pain to the right chest wall after fall obtained imaging of the right ribs which also showed no acute fracture or dislocation.  Likely contusion.  He denies any vasovagal  symptoms, chest pain or palpitations prior to fall so low suspicion for cardiac etiology of fall.  Appears to be mechanical fall after tripping over an object in the living room.  Hemodynamically stable and safe for discharge.  Provided with sling for comfort as well as naproxen for pain relief.  Final Clinical Impression(s) / ED Diagnoses Final diagnoses:  Acute pain of right shoulder    Rx / DC Orders ED Discharge Orders          Ordered    naproxen (NAPROSYN) 500 MG tablet  2 times daily PRN        03/10/21 1101             Mickie Hillier, PA-C 03/10/21 2107    Noemi Chapel, MD 03/11/21 [phone]

## 2021-03-10 NOTE — ED Notes (Signed)
Pt provided discharge instructions and prescription information. Pt was given the opportunity to ask questions and questions were answered. Discharge signature not obtained in the setting of the COVID-19 pandemic in order to reduce high touch surfaces.  ° °

## 2021-03-10 NOTE — Discharge Instructions (Addendum)
You are seen in the emergency department today after a fall on her right shoulder.  We obtained an x-ray of your right shoulder and your ribs on the right side.  Both of these images showed that you had no fractures or dislocations which is reassuring.  In the meantime I have given you a sling for your arm for comfort and we have prescribed you naproxen which is an anti-inflammatory that you can take for moderate pain until you begin to improve.  Please return to your primary care provider for follow-up as needed and you may return to emergency department for any reason.

## 2021-03-10 NOTE — ED Provider Notes (Signed)
This is a very pleasant patient, 80 years old, complains of having some shoulder pain that predates the stroke that he had last month, he was actually given steroids and muscle relaxers to treat at that time, after having a stroke and receiving tPA he returned to almost his baseline neurologically, unfortunately last night while he was walking to go to the bathroom in the dark he fell to the ground, he had no neurologic complaints but had complained of his right shoulder after the fall.  On exam he has decent range of motion but pain with active range of motion.  Passive range of motion is minimally painful.  He has normal grips, normal pulses, normal sensation to his right upper extremity, normal grip, normal strength at the elbow, he has pain with trying to move the arm at the shoulder but mostly in the upper lateral pectoral area and the ribs in that area.  He has minimal pain with deep breathing, no hypoxia or tachycardia.  X-ray of the shoulder is normal, x-ray of the ribs is pending, anticipate discharge with a sling   Cristian Chapel, MD 03/11/21 [phone]

## 2021-03-15 DIAGNOSIS — M170 Bilateral primary osteoarthritis of knee: Secondary | ICD-10-CM | POA: Diagnosis not present

## 2021-03-19 ENCOUNTER — Other Ambulatory Visit: Payer: Self-pay | Admitting: Family Medicine

## 2021-03-21 ENCOUNTER — Other Ambulatory Visit: Payer: Self-pay | Admitting: Family Medicine

## 2021-03-21 NOTE — Telephone Encounter (Signed)
Pt called to check on Zolpidem refill. I told him that he would have to be seen to get refill. I offered him an appointment for tomorrow but he has another appt in  [facility] tomorrow. Dr Livia Snellen is booked up. I was wondering if nurse could open appt from him. He was last seen in August

## 2021-03-21 NOTE — Telephone Encounter (Signed)
Last ordered: 1 month ago by August Albino, NP Last refill: 11/20/2020

## 2021-03-22 ENCOUNTER — Other Ambulatory Visit: Payer: Self-pay | Admitting: Family Medicine

## 2021-03-22 ENCOUNTER — Telehealth: Payer: Self-pay | Admitting: Family Medicine

## 2021-03-22 DIAGNOSIS — E118 Type 2 diabetes mellitus with unspecified complications: Secondary | ICD-10-CM

## 2021-03-22 DIAGNOSIS — K219 Gastro-esophageal reflux disease without esophagitis: Secondary | ICD-10-CM

## 2021-03-22 DIAGNOSIS — M170 Bilateral primary osteoarthritis of knee: Secondary | ICD-10-CM | POA: Diagnosis not present

## 2021-03-22 MED ORDER — METFORMIN HCL 500 MG PO TABS: 500.0000 mg | ORAL_TABLET | Freq: Every day | ORAL | 1 refills | Status: DC

## 2021-03-22 MED ORDER — FLUTICASONE PROPIONATE 50 MCG/ACT NA SUSP: 2.0000 | Freq: Every day | NASAL | 2 refills | Status: DC

## 2021-03-22 MED ORDER — MONTELUKAST SODIUM 10 MG PO TABS: 10.0000 mg | ORAL_TABLET | Freq: Every day | ORAL | 1 refills | Status: DC

## 2021-03-22 MED ORDER — SUCRALFATE 1 G PO TABS: 1.0000 g | ORAL_TABLET | Freq: Every day | ORAL | 3 refills | Status: DC | PRN

## 2021-03-22 NOTE — Telephone Encounter (Signed)
I refilled his meds. He was recently here, and a few of his meds weren't due at the time. I sent those in just now. Also, I am not prescribing any controlled substance for him.

## 2021-03-22 NOTE — Telephone Encounter (Signed)
Pt called stating that he needs refills on his medicines and was told that refills are being denied because he is overdue for an appt. Offered pt an appt to see Dr Livia Snellen next week (9/29) and pt said he couldn't do that because he has to go to [place] with his wife for an appt that day. Explained to pt that Dr Livia Snellen next available was 11/8. Pt said he cant wait that long to get meds. Told pt that as long as the medicine was not controlled, I could send request for Dr Livia Snellen to send in enough medicine to last him until his appt. Pt said the medicine was controlled. Explained to pt that if the medicine is controlled and he is overdue for an appt then he needed to be seen asap because per contract that should have been signed, pt is required to be seen every so often before he can get more refills. Pt said that he was going to find another doctor.

## 2021-04-13 ENCOUNTER — Ambulatory Visit (INDEPENDENT_AMBULATORY_CARE_PROVIDER_SITE_OTHER): Payer: Medicare HMO

## 2021-04-13 ENCOUNTER — Other Ambulatory Visit: Payer: Self-pay

## 2021-04-13 DIAGNOSIS — Z23 Encounter for immunization: Secondary | ICD-10-CM | POA: Diagnosis not present

## 2021-04-17 ENCOUNTER — Other Ambulatory Visit: Payer: Self-pay | Admitting: Family Medicine

## 2021-04-17 DIAGNOSIS — E118 Type 2 diabetes mellitus with unspecified complications: Secondary | ICD-10-CM

## 2021-04-18 ENCOUNTER — Ambulatory Visit (INDEPENDENT_AMBULATORY_CARE_PROVIDER_SITE_OTHER): Payer: Medicare HMO | Admitting: Family Medicine

## 2021-04-18 ENCOUNTER — Encounter: Payer: Self-pay | Admitting: Family Medicine

## 2021-04-18 ENCOUNTER — Other Ambulatory Visit: Payer: Self-pay

## 2021-04-18 ENCOUNTER — Ambulatory Visit (INDEPENDENT_AMBULATORY_CARE_PROVIDER_SITE_OTHER): Payer: Medicare HMO

## 2021-04-18 VITALS — BP 135/79 | HR 65 | Temp 98.3°F | Ht 73.0 in | Wt 282.0 lb

## 2021-04-18 DIAGNOSIS — W19XXXA Unspecified fall, initial encounter: Secondary | ICD-10-CM | POA: Diagnosis not present

## 2021-04-18 DIAGNOSIS — M19012 Primary osteoarthritis, left shoulder: Secondary | ICD-10-CM | POA: Diagnosis not present

## 2021-04-18 DIAGNOSIS — S46912A Strain of unspecified muscle, fascia and tendon at shoulder and upper arm level, left arm, initial encounter: Secondary | ICD-10-CM | POA: Diagnosis not present

## 2021-04-18 DIAGNOSIS — M25512 Pain in left shoulder: Secondary | ICD-10-CM | POA: Diagnosis not present

## 2021-04-18 DIAGNOSIS — R0781 Pleurodynia: Secondary | ICD-10-CM

## 2021-04-18 DIAGNOSIS — S20212A Contusion of left front wall of thorax, initial encounter: Secondary | ICD-10-CM | POA: Diagnosis not present

## 2021-04-18 DIAGNOSIS — R0789 Other chest pain: Secondary | ICD-10-CM | POA: Diagnosis not present

## 2021-04-18 NOTE — Progress Notes (Signed)
Subjective:  Patient ID: Cristian Moore, male    DOB: 1940-08-17, 80 y.o.   MRN: [id number]  Patient Care Team: [name], MD as PCP - General (Family Medicine) [name], MD as PCP - Cardiology (Cardiology) Gatha Mayer, MD as Consulting Physician (Gastroenterology) [name], MD as Consulting Physician (Cardiology) Anda Kraft, MD as Consulting Physician (Endocrinology) Tanda Rockers, MD as Consulting Physician (Pulmonary Disease) Netta Cedars, MD as Consulting Physician (Orthopedic Surgery) Suella Broad, MD as Consulting Physician (Physical Medicine and Rehabilitation) Wanda Plump, NP as Nurse Practitioner (Urology)   Chief Complaint:  Fall (Rib pain left side/Left shoulder pain)   HPI: [name] is a 80 y.o. male presenting on 04/18/2021 for Fall (Rib pain left side/Left shoulder pain)   Pt presents today for evaluation after a fall on Sunday. He was getting onto his zero turn lawnmower and stumbled and fell landing on his left side. He states he now has left rib pain and left shoulder pain. He has not taken anything for the symptoms. States the shoulder pain is improving but ribs are still tender.   Fall The accident occurred 3 to 5 days ago. The fall occurred while standing. He fell from a height of 3 to 5 ft. He landed on Grass. There was no blood loss. The point of impact was the left shoulder. The pain is present in the left upper arm and left shoulder (left ribs). The pain is at a severity of 3/10. The pain is mild. The symptoms are aggravated by pressure on injury and movement. Pertinent negatives include no abdominal pain, bowel incontinence, fever, headaches, hearing loss, hematuria, loss of consciousness, nausea, numbness, tingling, visual change or vomiting. He has tried nothing for the symptoms.    Relevant past medical, surgical, family, and social history reviewed and updated as indicated.  Allergies and medications reviewed  and updated. Data reviewed: Chart in Epic.   Past Medical History:  Diagnosis Date   Allergy    Arthritis    Asthma    Benign prostatic hypertrophy    Carotid stenosis    Carotid US (07/2013):  Bilateral 1-39% ICA (f/u 2 yrs)   Cataract    mild   Chest pain    a. 01/2013 Cath: LM nl, LAD 25p, D1 25p, LCX nl, RCA 25p, PDA nl, RPL nl, EF 55%;  b. 03/2015 MV: EF 59%, no ischemia; c. 03/2016 MV: basal inf/mid inf defect, likely artifact, EF 48%, low risk.   Chronic Dyspnea on exertion    a. See cardiac eval under CAD heading;  b. 03/2015 Echo: EF 50-55%, gr1 DD, triv AI;  b. 03/2015 CTA Chest: No PE;  c. 05/2016 CPX: no significant cardi-pulmonary limitation noted. Suspect obesity contributing to exertional symptoms.   Diabetes mellitus without complication ([place]) 3016   Difficulty urinating    Diverticulitis    Diverticulosis    Exogenous obesity    GERD (gastroesophageal reflux disease)    moe like heart burn   H/O blepharoplasty 07/27/2013   H/O hiatal hernia    Hernia    Umbulical    History of adenomatous polyp of colon 1991   Hyperlipidemia    Hypothyroidism 08/23/2019   Laceration 01/11/2009   Deep laceration left index finger dorsally   Musculoskeletal pain    in the right shoulder - S/P ROTATOR CUFF REPAIR-BUT STILL HAS PAIN   Obstructive sleep apnea     CPAP    AUTO SET 6 TO  [place] AT 10   Rectal bleeding    PT ATTRIBUTES TO HEMORRHOIDS   Sleep apnea    wears cpap    Stroke ([place])    SUPRAVENTRICULAR TACHYCARDIA 08/01/2009   Qualifier: Diagnosis of  By: Lovette Cliche, CNA, Christy     Thyroiditis    Ulcer 2008   Walking pneumonia     Past Surgical History:  Procedure Laterality Date   COLON SURGERY  07/2012   COLONOSCOPY     for polyps   ELBOW BURSA SURGERY     Eyelid Surgery Bilateral 05/2013   INGUINAL HERNIA REPAIR     KIDNEY STONE SURGERY     KNEE SURGERY     meniscus right knee   LAPAROSCOPIC SIGMOID COLECTOMY  07/23/2012   Procedure:  LAPAROSCOPIC SIGMOID COLECTOMY;  Surgeon: [name], MD;  Location: [facility];  Service: General;  Laterality: N/A;  Laparoscopic Sigmoid Colectomy   LEFT HEART CATHETERIZATION WITH CORONARY ANGIOGRAM N/A 02/22/2013   Procedure: LEFT HEART CATHETERIZATION WITH CORONARY ANGIOGRAM;  Surgeon: [name], MD;  Location: [facility] CATH LAB;  Service: Cardiovascular;  Laterality: N/A;   PROCTOSCOPY  07/23/2012   Procedure: PROCTOSCOPY;  Surgeon: [name], MD;  Location: [facility];  Service: General;  Laterality: N/A;  Rigid Proctoscopy   ROTATOR CUFF REPAIR     TONSILLECTOMY     TOTAL HIP ARTHROPLASTY Right 05/27/2018   Procedure: RIGHT TOTAL HIP ARTHROPLASTY ANTERIOR APPROACH;  Surgeon: [name], MD;  Location: [facility];  Service: Orthopedics;  Laterality: Right;  [id number]   UMBILICAL HERNIA REPAIR  07/23/2012   Procedure: HERNIA REPAIR UMBILICAL ADULT;  Surgeon: [name], MD;  Location: [facility];  Service: General;  Laterality: N/A;  Primary Umbilical Hernia Repair    Social History   Socioeconomic History   Marital status: Married    Spouse name: Alice   Number of children: 4   Years of education: 13   Highest education level: Some college, no degree  Occupational History   Occupation: Retired  Tobacco Use   Smoking status: Former    Types: Cigars    Quit date: 08/12/1999    Years since quitting: 21.6   Smokeless tobacco: Never   Tobacco comments:    smoked cigars on and off for approx 4-5 yrs  Vaping Use   Vaping Use: Never used  Substance and Sexual Activity   Alcohol use: No   Drug use: No   Sexual activity: Yes  Other Topics Concern   Not on file  Social History Narrative   Lives at home with wife      [facility] (09/16/16):   Originally from [place] [facility]. Previously served & lived in [place], [place], & [place]. 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 He also worked in [employer]. No pets currently. Remote  bird exposure. No mold or hot tub exposure. Enjoys playing banjo as well as hunting. He also raises cattle. Has carpet in his bedroom. No feather bedding. No indoor plants.    Social Determinants of Health   Financial Resource Strain: Low Risk    Difficulty of Paying Living Expenses: Not hard at all  Food Insecurity: No Food Insecurity   Worried About Charity fundraiser in the Last Year: Never true   Ran Out of Food in the Last Year: Never true  Transportation Needs: No Transportation Needs   Lack of  Transportation (Medical): No   Lack of Transportation (Non-Medical): No  Physical Activity: Not on file  Stress: Not on file  Social Connections: Not on file  Intimate Partner Violence: Not on file    Outpatient Encounter Medications as of 04/18/2021  Medication Sig   acetaminophen (TYLENOL) 500 MG tablet Take 1,000 mg by mouth every 6 (six) hours as needed for moderate pain.   albuterol (VENTOLIN HFA) 108 (90 Base) MCG/ACT inhaler Inhale 2 puffs into the lungs every 6 (six) hours as needed for wheezing or shortness of breath.   aspirin EC 81 MG tablet Take 81 mg by mouth daily. Swallow whole.   calcium carbonate (OS-CAL) 1250 (500 Ca) MG chewable tablet CHEW TWO TABLETS BY MOUTH DAILY   Cholecalciferol (VITAMIN D) 50 MCG (2000 UT) tablet Take 2,000 Units by mouth daily.   clopidogrel (PLAVIX) 75 MG tablet Take 1 tablet (75 mg total) by mouth daily.   diclofenac Sodium (VOLTAREN) 1 % GEL Apply 2 g topically 4 (four) times daily as needed (shoulders/knees/back pain).   escitalopram (LEXAPRO) 10 MG tablet Take 1 tablet (10 mg total) by mouth at bedtime.   famotidine (PEPCID) 10 MG tablet Take 10 mg by mouth 2 (two) times daily as needed for heartburn or indigestion.   fluticasone (FLONASE) 50 MCG/ACT nasal spray Place 2 sprays into both nostrils daily.   glucose blood (ONETOUCH VERIO) test strip USE TO CHECK BLOOD SUGAR TWICE DAILY AND AS NEEDED   HYDROcodone-acetaminophen (NORCO/VICODIN)  5-325 MG tablet Take 0.5 tablets by mouth every 6 (six) hours as needed for moderate pain.   hydroxypropyl methylcellulose / hypromellose (ISOPTO TEARS / GONIOVISC) 2.5 % ophthalmic solution Place 1 drop into both eyes 4 (four) times daily as needed for dry eyes.   Krill Oil 350 MG CAPS Take 350 mg by mouth daily.   metFORMIN (GLUCOPHAGE) 500 MG tablet TAKE 1 TABLET DAILY WITH   BREAKFAST   metoprolol tartrate (LOPRESSOR) 50 MG tablet Take 0.5 tablets (25 mg total) by mouth 2 (two) times daily.   montelukast (SINGULAIR) 10 MG tablet Take 1 tablet (10 mg total) by mouth at bedtime.   Multiple Vitamins-Minerals (CENTRUM SILVER PO) Take 1 tablet by mouth daily.   naproxen (NAPROSYN) 500 MG tablet Take 1 tablet (500 mg total) by mouth 2 (two) times daily as needed for moderate pain.   nitroGLYCERIN (NITROSTAT) 0.4 MG SL tablet DISSOLVE ONE TABLET UNDER THE TONGUE EVERY 5 MINUTES AS NEEDED FOR CHEST PAIN.  DO NOT EXCEED A TOTAL OF 3 DOSES IN 15 MINUTES (Patient taking differently: Place 0.4 mg under the tongue every 5 (five) minutes as needed for chest pain.)   PRESCRIPTION MEDICATION Inhale 1 application into the lungs at bedtime. "CPAP" MACHINE   simvastatin (ZOCOR) 20 MG tablet Take 1 tablet (20 mg total) by mouth at bedtime.   sucralfate (CARAFATE) 1 g tablet Take 1 tablet (1 g total) by mouth daily as needed (stomach pain).   tamsulosin (FLOMAX) 0.4 MG CAPS capsule Take 2 capsules (0.8 mg total) by mouth daily after supper.   zolpidem (AMBIEN) 10 MG tablet Take 1 tablet (10 mg total) by mouth at bedtime as needed for sleep.   No facility-administered encounter medications on file as of 04/18/2021.    Allergies  Allergen Reactions   Penicillins Rash and Hives    Over 60 years ago Has patient had a PCN reaction causing immediate rash, facial/tongue/throat swelling, SOB or lightheadedness with hypotension: Unknown Has patient had a PCN  reaction causing severe rash involving mucus membranes or  skin necrosis: Unknown Has patient had a PCN reaction that required hospitalization: No Has patient had a PCN reaction occurring within the last 10 years: No If all of the above answers are "NO", then may proceed with Cephalosporin use.     Review of Systems  Constitutional:  Negative for activity change, appetite change, chills, diaphoresis, fatigue, fever and unexpected weight change.  HENT: Negative.    Eyes: Negative.   Respiratory:  Negative for cough, chest tightness and shortness of breath.   Cardiovascular:  Positive for chest pain (left ribs). Negative for palpitations and leg swelling.  Gastrointestinal:  Negative for abdominal pain, blood in stool, bowel incontinence, constipation, diarrhea, nausea and vomiting.  Endocrine: Negative.   Genitourinary:  Negative for decreased urine volume, difficulty urinating, dysuria, frequency, hematuria and urgency.  Musculoskeletal:  Positive for arthralgias and gait problem (instability at times since CVA). Negative for back pain, joint swelling, myalgias, neck pain and neck stiffness.  Skin: Negative.   Allergic/Immunologic: Negative.   Neurological:  Negative for dizziness, tingling, tremors, seizures, loss of consciousness, syncope, facial asymmetry, speech difficulty, weakness, light-headedness, numbness and headaches.  Hematological: Negative.   Psychiatric/Behavioral:  Negative for confusion, hallucinations, sleep disturbance and suicidal ideas.   All other systems reviewed and are negative.      Objective:  BP 135/79   Pulse 65   Temp 98.3 F (36.8 C)   Ht 6\' 1"  (1.854 m)   Wt 282 lb (127.9 kg)   SpO2 97%   BMI 37.21 kg/m    Wt Readings from Last 3 Encounters:  04/18/21 282 lb (127.9 kg)  03/10/21 279 lb (126.6 kg)  02/19/21 279 lb (126.6 kg)    Physical Exam Vitals and nursing note reviewed.  Constitutional:      General: He is not in acute distress.    Appearance: Normal appearance. He is well-developed and  well-groomed. He is obese. He is not ill-appearing, toxic-appearing or diaphoretic.  HENT:     Head: Normocephalic and atraumatic.     Jaw: There is normal jaw occlusion.     Right Ear: Hearing normal.     Left Ear: Hearing normal.     Nose: Nose normal.     Mouth/Throat:     Lips: Pink.     Mouth: Mucous membranes are moist.     Pharynx: Oropharynx is clear. Uvula midline.  Eyes:     General: Lids are normal.     Extraocular Movements: Extraocular movements intact.     Conjunctiva/sclera: Conjunctivae normal.     Pupils: Pupils are equal, round, and reactive to light.  Neck:     Thyroid: No thyroid mass, thyromegaly or thyroid tenderness.     Vascular: No carotid bruit or JVD.     Trachea: Trachea and phonation normal.  Cardiovascular:     Rate and Rhythm: Normal rate and regular rhythm.     Chest Wall: PMI is not displaced.     Pulses: Normal pulses.     Heart sounds: Normal heart sounds. No murmur heard.   No friction rub. No gallop.  Pulmonary:     Effort: Pulmonary effort is normal. No respiratory distress.     Breath sounds: Normal breath sounds. No wheezing.  Chest:     Chest wall: Tenderness present. No mass, lacerations, deformity, swelling, crepitus or edema. There is no dullness to percussion.    Abdominal:     General: Bowel sounds are normal.  There is no distension or abdominal bruit.     Palpations: Abdomen is soft. There is no hepatomegaly or splenomegaly.     Tenderness: There is no abdominal tenderness. There is no right CVA tenderness or left CVA tenderness.     Hernia: No hernia is present.  Musculoskeletal:        General: Normal range of motion.     Right shoulder: Normal.     Left shoulder: Tenderness present. No swelling, deformity, effusion, laceration, bony tenderness or crepitus. Normal range of motion. Normal strength. Normal pulse.     Right upper arm: Normal.     Left upper arm: Normal.     Cervical back: Normal, normal range of motion and neck  supple.     Right lower leg: No edema.     Left lower leg: No edema.  Lymphadenopathy:     Cervical: No cervical adenopathy.  Skin:    General: Skin is warm and dry.     Capillary Refill: Capillary refill takes less than 2 seconds.     Coloration: Skin is not cyanotic, jaundiced or pale.     Findings: No rash.  Neurological:     General: No focal deficit present.     Mental Status: He is alert and oriented to person, place, and time.     Cranial Nerves: Cranial nerves are intact. No cranial nerve deficit.     Sensory: Sensation is intact. No sensory deficit.     Motor: Motor function is intact. No weakness.     Coordination: Coordination is intact. Coordination normal.     Gait: Gait is intact. Gait normal.     Deep Tendon Reflexes: Reflexes are normal and symmetric. Reflexes normal.  Psychiatric:        Attention and Perception: Attention and perception normal.        Mood and Affect: Mood and affect normal.        Speech: Speech normal.        Behavior: Behavior normal. Behavior is cooperative.        Thought Content: Thought content normal.        Cognition and Memory: Cognition and memory normal.        Judgment: Judgment normal.    Results for orders placed or performed during the hospital encounter of 02/11/21  Resp Panel by RT-PCR (Flu A&B, Covid) Nasopharyngeal Swab   Specimen: Nasopharyngeal Swab; Nasopharyngeal(NP) swabs in vial transport medium  Result Value Ref Range   SARS Coronavirus 2 by RT PCR NEGATIVE NEGATIVE   Influenza A by PCR NEGATIVE NEGATIVE   Influenza B by PCR NEGATIVE NEGATIVE  MRSA Next Gen by PCR, Nasal  Result Value Ref Range   MRSA by PCR Next Gen NOT DETECTED NOT DETECTED  Ethanol  Result Value Ref Range   Alcohol, Ethyl (B) <10 <10 mg/dL  Protime-INR  Result Value Ref Range   Prothrombin Time 12.8 11.4 - 15.2 seconds   INR 1.0 0.8 - 1.2  APTT  Result Value Ref Range   aPTT 22 (L) 24 - 36 seconds  CBC  Result Value Ref Range   WBC  14.9 (H) 4.0 - 10.5 K/uL   RBC 4.42 4.22 - 5.81 MIL/uL   Hemoglobin 14.6 13.0 - 17.0 g/dL   HCT 44.7 39.0 - 52.0 %   MCV 101.1 (H) 80.0 - 100.0 fL   MCH 33.0 26.0 - 34.0 pg   MCHC 32.7 30.0 - 36.0 g/dL   RDW 13.3 11.5 -  15.5 %   Platelets 306 150 - 400 K/uL   nRBC 0.0 0.0 - 0.2 %  Differential  Result Value Ref Range   Neutrophils Relative % 75 %   Neutro Abs 11.2 (H) 1.7 - 7.7 K/uL   Lymphocytes Relative 13 %   Lymphs Abs 1.9 0.7 - 4.0 K/uL   Monocytes Relative 10 %   Monocytes Absolute 1.5 (H) 0.1 - 1.0 K/uL   Eosinophils Relative 0 %   Eosinophils Absolute 0.0 0.0 - 0.5 K/uL   Basophils Relative 0 %   Basophils Absolute 0.1 0.0 - 0.1 K/uL   Immature Granulocytes 2 %   Abs Immature Granulocytes 0.26 (H) 0.00 - 0.07 K/uL  Comprehensive metabolic panel  Result Value Ref Range   Sodium 137 135 - 145 mmol/L   Potassium 5.0 3.5 - 5.1 mmol/L   Chloride 105 98 - 111 mmol/L   CO2 25 22 - 32 mmol/L   Glucose, Bld 121 (H) 70 - 99 mg/dL   BUN 25 (H) 8 - 23 mg/dL   Creatinine, Ser 1.24 0.61 - 1.24 mg/dL   Calcium 9.7 8.9 - 10.3 mg/dL   Total Protein 6.4 (L) 6.5 - 8.1 g/dL   Albumin 3.8 3.5 - 5.0 g/dL   AST 19 15 - 41 U/L   ALT 25 0 - 44 U/L   Alkaline Phosphatase 30 (L) 38 - 126 U/L   Total Bilirubin 0.6 0.3 - 1.2 mg/dL   GFR, Estimated 59 (L) >60 mL/min   Anion gap 7 5 - 15  Urine rapid drug screen (hosp performed)  Result Value Ref Range   Opiates NONE DETECTED NONE DETECTED   Cocaine NONE DETECTED NONE DETECTED   Benzodiazepines NONE DETECTED NONE DETECTED   Amphetamines NONE DETECTED NONE DETECTED   Tetrahydrocannabinol NONE DETECTED NONE DETECTED   Barbiturates NONE DETECTED NONE DETECTED  Urinalysis, Routine w reflex microscopic  Result Value Ref Range   Color, Urine STRAW (A) YELLOW   APPearance CLEAR CLEAR   Specific Gravity, Urine 1.014 1.005 - 1.030   pH 6.0 5.0 - 8.0   Glucose, UA NEGATIVE NEGATIVE mg/dL   Hgb urine dipstick NEGATIVE NEGATIVE   Bilirubin  Urine NEGATIVE NEGATIVE   Ketones, ur NEGATIVE NEGATIVE mg/dL   Protein, ur NEGATIVE NEGATIVE mg/dL   Nitrite NEGATIVE NEGATIVE   Leukocytes,Ua NEGATIVE NEGATIVE  Hemoglobin A1c  Result Value Ref Range   Hgb A1c MFr Bld 6.2 (H) 4.8 - 5.6 %   Mean Plasma Glucose 131.24 mg/dL  Lipid panel  Result Value Ref Range   Cholesterol 125 0 - 200 mg/dL   Triglycerides 107 <150 mg/dL   HDL 50 >40 mg/dL   Total CHOL/HDL Ratio 2.5 RATIO   VLDL 21 0 - 40 mg/dL   LDL Cholesterol 54 0 - 99 mg/dL  Glucose, capillary  Result Value Ref Range   Glucose-Capillary 170 (H) 70 - 99 mg/dL   Comment 1 Notify RN    Comment 2 Document in Chart   Glucose, capillary  Result Value Ref Range   Glucose-Capillary 98 70 - 99 mg/dL  Glucose, capillary  Result Value Ref Range   Glucose-Capillary 129 (H) 70 - 99 mg/dL  Glucose, capillary  Result Value Ref Range   Glucose-Capillary 89 70 - 99 mg/dL  I-stat chem 8, ED  Result Value Ref Range   Sodium 137 135 - 145 mmol/L   Potassium 4.5 3.5 - 5.1 mmol/L   Chloride 104 98 - 111 mmol/L  BUN 26 (H) 8 - 23 mg/dL   Creatinine, Ser 1.20 0.61 - 1.24 mg/dL   Glucose, Bld 117 (H) 70 - 99 mg/dL   Calcium, Ion 1.12 (L) 1.15 - 1.40 mmol/L   TCO2 24 22 - 32 mmol/L   Hemoglobin 14.3 13.0 - 17.0 g/dL   HCT 42.0 39.0 - 52.0 %  CBG monitoring, ED  Result Value Ref Range   Glucose-Capillary 118 (H) 70 - 99 mg/dL   Comment 1 Notify RN   ECHOCARDIOGRAM COMPLETE  Result Value Ref Range   Weight 4,515.02 oz   BP 145/69 mmHg   S' Lateral 3.90 cm   Area-P 1/2 3.05 cm2     X-Ray: left shoulder: possible slight AC joint separation. No fractures. Preliminary x-ray reading by Monia Pouch, FNP-C, [facility]. X-Ray: left ribs with chest: no obvious fractures, no pneumothorax No acute findings. Preliminary x-ray reading by Monia Pouch, FNP-C, [facility].   Pertinent labs & imaging results that were available during my care of the patient were reviewed by me and considered in my  medical decision making.  Assessment & Plan:  Olanda was seen today for fall.  Diagnoses and all orders for this visit:  Fall, initial encounter Rib pain on left side Acute pain of left shoulder -     DG Shoulder Left -     DG Ribs Unilateral W/Chest Left  Contusion of rib on left side, initial encounter No acute findings noted on imaging, will notify pt if radiology reading differs. Symptomatic care discussed in detail. Report any new, worsening, or persistent symptoms.   Strain of left shoulder, initial encounter Possible mild AC joint separation on imaging, no fractures. Will notify pt if radiology reading differs. Symptomatic care discussed in detail. Pt aware to report any new, worsening, or persistent symptoms.     Continue all other maintenance medications.  Follow up plan: Return in about 4 weeks (around 05/16/2021), or if symptoms worsen or fail to improve.   Continue healthy lifestyle choices, including diet (rich in fruits, vegetables, and lean proteins, and low in salt and simple carbohydrates) and exercise (at least 30 minutes of moderate physical activity daily).  Educational handout given for chest wall pain, shoulder pain  The above assessment and management plan was discussed with the patient. The patient verbalized understanding of and has agreed to the management plan. Patient is aware to call the clinic if they develop any new symptoms or if symptoms persist or worsen. Patient is aware when to return to the clinic for a follow-up visit. Patient educated on when it is appropriate to go to the emergency department.   Monia Pouch, FNP-C [facility] [phone]

## 2021-04-19 ENCOUNTER — Encounter: Payer: Self-pay | Admitting: *Deleted

## 2021-04-25 ENCOUNTER — Ambulatory Visit: Payer: No Typology Code available for payment source

## 2021-05-08 ENCOUNTER — Other Ambulatory Visit: Payer: Self-pay | Admitting: Family Medicine

## 2021-05-11 ENCOUNTER — Encounter: Payer: Self-pay | Admitting: *Deleted

## 2021-05-11 NOTE — Progress Notes (Unsigned)
[facility] - 05/11/21 0900       Assessment    Assessment type Phone to patient    Is patient still in hospital? No    Date of hospital discharge after thrombolysis? 02/11/21      Final 90-Day Modified Rankin Score   Final 90-Day Modified Rankin Score: ([facility]) 2-Unable to carry out all previous activities, but able to look after own affairs and walk without assistance of another person (could live alone without assistance)      [id number]   Mobility 3- moderate problems in walking about    Self-care 1- no problems with Self-care    Usual activities 2- slight problems with performing usual activities    Pain/discomfort 2- slight pain or discomfort    Anxiety/Depression 2- slight anxious or depressed    What number between 0-100 best describes the patient's health state today (100 means the best health; 0 means the worst health)? [facility] Admission   In the Past 3 months (since your initial hospitalisation for stroke), have you been admitted to hospital (including day-only procedures) for any reason? No      Doctor consultations   In the past 3 months (since your initial hospitalisation for stroke), have you seen any doctors or other health professional (for example physiotherapy, outpatient nurse, general practitioner) for any reason? Yes      a. Doctor consultations   a. Type of service Family Medicine    a. Condition or purpose DM    a. Date of appointment 02/16/21      b. Doctor consultations   b. Type of service Neurology    b. Condition or purpose stroke follow up    b. Date of appointment 04/18/21      c. Doctor consultations   c. Type of service Family Medicine    c. Condition or purpose fall    c. Date of appointment 04/18/21      d. Doctor consultation   d. Type of service [facility]    d. Condition or purpose follow up    d. Date of appointment 05/02/21

## 2021-05-21 ENCOUNTER — Encounter: Payer: Self-pay | Admitting: Family Medicine

## 2021-05-21 ENCOUNTER — Ambulatory Visit (INDEPENDENT_AMBULATORY_CARE_PROVIDER_SITE_OTHER): Payer: Medicare HMO | Admitting: Family Medicine

## 2021-05-21 ENCOUNTER — Other Ambulatory Visit: Payer: Self-pay

## 2021-05-21 VITALS — BP 121/74 | HR 57 | Temp 97.7°F | Ht 73.0 in | Wt 276.4 lb

## 2021-05-21 DIAGNOSIS — I10 Essential (primary) hypertension: Secondary | ICD-10-CM

## 2021-05-21 DIAGNOSIS — E782 Mixed hyperlipidemia: Secondary | ICD-10-CM

## 2021-05-21 DIAGNOSIS — E118 Type 2 diabetes mellitus with unspecified complications: Secondary | ICD-10-CM

## 2021-05-21 DIAGNOSIS — Z125 Encounter for screening for malignant neoplasm of prostate: Secondary | ICD-10-CM

## 2021-05-21 DIAGNOSIS — E039 Hypothyroidism, unspecified: Secondary | ICD-10-CM

## 2021-05-21 DIAGNOSIS — N400 Enlarged prostate without lower urinary tract symptoms: Secondary | ICD-10-CM

## 2021-05-21 DIAGNOSIS — E559 Vitamin D deficiency, unspecified: Secondary | ICD-10-CM | POA: Diagnosis not present

## 2021-05-21 LAB — BAYER DCA HB A1C WAIVED: HB A1C (BAYER DCA - WAIVED): 6 % — ABNORMAL HIGH (ref 4.8–5.6)

## 2021-05-21 MED ORDER — ZOLPIDEM TARTRATE 10 MG PO TABS: 10.0000 mg | ORAL_TABLET | Freq: Every evening | ORAL | 1 refills | Status: DC | PRN

## 2021-05-21 MED ORDER — METFORMIN HCL 500 MG PO TABS: 500.0000 mg | ORAL_TABLET | Freq: Every day | ORAL | 2 refills | Status: AC

## 2021-05-21 MED ORDER — TAMSULOSIN HCL 0.4 MG PO CAPS: 0.8000 mg | ORAL_CAPSULE | Freq: Every day | ORAL | 1 refills | Status: AC

## 2021-05-21 NOTE — Addendum Note (Signed)
Addended by: [name] on: 05/21/2021 11:22 AM   Modules accepted: Orders

## 2021-05-21 NOTE — Progress Notes (Addendum)
Subjective:  Patient ID: Cristian Moore,  male    DOB: 1941/06/08  Age: 80 y.o.    CC: Medical Management of Chronic Issues   HPI Cristian Moore presents for  follow-up of hypertension. Patient has no history of headache chest pain or shortness of breath or recent cough. Patient also denies symptoms of TIA such as numbness weakness lateralizing. Patient denies side effects from medication. States taking it regularly.  Patient also  in for follow-up of elevated cholesterol. Doing well without complaints on current medication. Denies side effects  including myalgia and arthralgia and nausea. Also in today for liver function testing. Currently no chest pain, shortness of breath or other cardiovascular related symptoms noted.  Follow-up of diabetes. Patient does check blood sugar at home. Readings run between 104 and 130 Patient denies symptoms such as excessive hunger or urinary frequency, excessive hunger, nausea No significant hypoglycemic spells noted. Medications reviewed. Pt reports taking them regularly. Pt. denies complication/adverse reaction today.    History Cristian Moore has a past medical history of Allergy, Arthritis, Asthma, Benign prostatic hypertrophy, Carotid stenosis, Cataract, Chest pain, Chronic Dyspnea on exertion, Diabetes mellitus without complication (HCC) (2015), Difficulty urinating, Diverticulitis, Diverticulosis, Exogenous obesity, GERD (gastroesophageal reflux disease), H/O blepharoplasty (07/27/2013), H/O hiatal hernia, Hernia, History of adenomatous polyp of colon (1991), Hyperlipidemia, Hypothyroidism (08/23/2019), Laceration (01/11/2009), Musculoskeletal pain, Obstructive sleep apnea, Rectal bleeding, Sleep apnea, Stroke (HCC), SUPRAVENTRICULAR TACHYCARDIA (08/01/2009), Thyroiditis, Ulcer (2008), and Walking pneumonia.   He has a past surgical history that includes Inguinal hernia repair; Kidney stone surgery; Tonsillectomy; Colonoscopy; Rotator cuff repair; Knee  surgery; Elbow bursa surgery; Laparoscopic sigmoid colectomy (07/23/2012); Umbilical hernia repair (07/23/2012); Proctoscopy (07/23/2012); Eyelid Surgery (Bilateral, 05/2013); left heart catheterization with coronary angiogram (N/A, 02/22/2013); Total hip arthroplasty (Right, 05/27/2018); and Colon surgery (07/2012).   His family history includes Diverticulitis in his daughter and son; Heart disease in his mother; Lung cancer in his maternal uncle; Stroke in his mother.He reports that he quit smoking about 21 years ago. His smoking use included cigars. He has never used smokeless tobacco. He reports that he does not drink alcohol and does not use drugs.  Current Outpatient Medications on File Prior to Visit  Medication Sig Dispense Refill   acetaminophen (TYLENOL) 500 MG tablet Take 1,000 mg by mouth every 6 (six) hours as needed for moderate pain.     albuterol (VENTOLIN HFA) 108 (90 Base) MCG/ACT inhaler Inhale 2 puffs into the lungs every 6 (six) hours as needed for wheezing or shortness of breath. 18 g 3   aspirin EC 81 MG tablet Take 81 mg by mouth daily. Swallow whole.     calcium carbonate (OS-CAL) 1250 (500 Ca) MG chewable tablet CHEW TWO TABLETS BY MOUTH DAILY     Cholecalciferol (VITAMIN D) 50 MCG (2000 UT) tablet Take 2,000 Units by mouth daily.     clopidogrel (PLAVIX) 75 MG tablet Take 1 tablet (75 mg total) by mouth daily. 30 tablet 0   diclofenac Sodium (VOLTAREN) 1 % GEL Apply 2 g topically 4 (four) times daily as needed (shoulders/knees/back pain).     escitalopram (LEXAPRO) 10 MG tablet Take 1 tablet (10 mg total) by mouth at bedtime. 90 tablet 3   famotidine (PEPCID) 10 MG tablet Take 10 mg by mouth 2 (two) times daily as needed for heartburn or indigestion.     fluticasone (FLONASE) 50 MCG/ACT nasal spray Place 2 sprays into both nostrils daily. 48 g 2   glucose blood (ONETOUCH VERIO)  test strip USE TO CHECK BLOOD SUGAR TWICE DAILY AND AS NEEDED 200 each 3   HYDROcodone-acetaminophen  (NORCO/VICODIN) 5-325 MG tablet Take 0.5 tablets by mouth every 6 (six) hours as needed for moderate pain.     hydroxypropyl methylcellulose / hypromellose (ISOPTO TEARS / GONIOVISC) 2.5 % ophthalmic solution Place 1 drop into both eyes 4 (four) times daily as needed for dry eyes.     Krill Oil 350 MG CAPS Take 350 mg by mouth daily.     metoprolol tartrate (LOPRESSOR) 50 MG tablet Take 0.5 tablets (25 mg total) by mouth 2 (two) times daily. 90 tablet 3   montelukast (SINGULAIR) 10 MG tablet TAKE 1 TABLET AT BEDTIME 90 tablet 1   Multiple Vitamins-Minerals (CENTRUM SILVER PO) Take 1 tablet by mouth daily.     naproxen (NAPROSYN) 500 MG tablet Take 1 tablet (500 mg total) by mouth 2 (two) times daily as needed for moderate pain. 10 tablet 0   nitroGLYCERIN (NITROSTAT) 0.4 MG SL tablet DISSOLVE ONE TABLET UNDER THE TONGUE EVERY 5 MINUTES AS NEEDED FOR CHEST PAIN.  DO NOT EXCEED A TOTAL OF 3 DOSES IN 15 MINUTES (Patient taking differently: Place 0.4 mg under the tongue every 5 (five) minutes as needed for chest pain.) 25 tablet 11   PRESCRIPTION MEDICATION Inhale 1 application into the lungs at bedtime. "CPAP" MACHINE     simvastatin (ZOCOR) 20 MG tablet Take 1 tablet (20 mg total) by mouth at bedtime. 90 tablet 3   sucralfate (CARAFATE) 1 g tablet Take 1 tablet (1 g total) by mouth daily as needed (stomach pain). 90 tablet 3   No current facility-administered medications on file prior to visit.    ROS Review of Systems  Constitutional:  Negative for fever.  Respiratory:  Negative for shortness of breath.   Cardiovascular:  Negative for chest pain.  Musculoskeletal:  Negative for arthralgias.  Skin:  Negative for rash.  Neurological:  Positive for weakness ([facility] now [facility] his [facility]. Occurred with stroke.).  Psychiatric/Behavioral:  Positive for sleep disturbance (requiring zolpidem more frequently.).    Objective:  BP 121/74   Pulse (!) 57   Temp 97.7 F (36.5 C)    Ht $R'6\' 1"'Zg$  (1.854 m)   Wt 276 lb 6.4 oz (125.4 kg)   SpO2 96%   BMI 36.47 kg/m   BP Readings from Last 3 Encounters:  05/21/21 121/74  04/18/21 135/79  03/10/21 108/62    Wt Readings from Last 3 Encounters:  05/21/21 276 lb 6.4 oz (125.4 kg)  04/18/21 282 lb (127.9 kg)  03/10/21 279 lb (126.6 kg)     Physical Exam Vitals reviewed.  Constitutional:      Appearance: He is well-developed.  HENT:     Head: Normocephalic and atraumatic.     Right Ear: External ear normal.     Left Ear: External ear normal.     Mouth/Throat:     Pharynx: No oropharyngeal exudate or posterior oropharyngeal erythema.  Eyes:     Pupils: Pupils are equal, round, and reactive to light.  Cardiovascular:     Rate and Rhythm: Normal rate and regular rhythm.     Heart sounds: No murmur heard. Pulmonary:     Effort: No respiratory distress.     Breath sounds: Normal breath sounds.  Musculoskeletal:     Cervical back: Normal range of motion and neck supple.  Neurological:     Mental Status: He is alert and oriented  to person, place, and time.    Diabetic Foot Exam - Simple   No data filed       Assessment & Plan:   [place] was seen today for medical management of chronic issues.  Diagnoses and all orders for this visit:  Type 2 diabetes mellitus with complication, without long-term current use of insulin (HCC) -     Bayer DCA Hb A1c Waived -     metFORMIN (GLUCOPHAGE) 500 MG tablet; Take 1 tablet (500 mg total) by mouth daily with breakfast.  Hypothyroidism, unspecified type -     TSH + free T4  Essential hypertension -     CBC with Differential/Platelet -     CMP14+EGFR  Mixed hyperlipidemia -     Lipid panel  Prostate cancer screening -     PSA, total and free  Vitamin D deficiency -     VITAMIN D 25 Hydroxy (Vit-D Deficiency, Fractures)  Benign prostatic hyperplasia, unspecified whether lower urinary tract symptoms present -     tamsulosin (FLOMAX) 0.4 MG CAPS capsule;  Take 2 capsules (0.8 mg total) by mouth daily after supper.  Other orders -     Discontinue: zolpidem (AMBIEN) 10 MG tablet; Take 1 tablet (10 mg total) by mouth at bedtime as needed for sleep. -     zolpidem (AMBIEN) 10 MG tablet; Take 1 tablet (10 mg total) by mouth at bedtime as needed for sleep.  I have changed Cristian Moore's metFORMIN. I am also having him maintain his Vitamin D, Multiple Vitamins-Minerals (CENTRUM SILVER PO), PRESCRIPTION MEDICATION, nitroGLYCERIN, hydroxypropyl methylcellulose / hypromellose, acetaminophen, Krill Oil, albuterol, HYDROcodone-acetaminophen, famotidine, aspirin EC, diclofenac Sodium, clopidogrel, calcium carbonate, OneTouch Verio, escitalopram, metoprolol tartrate, simvastatin, naproxen, fluticasone, sucralfate, montelukast, tamsulosin, and zolpidem.  Meds ordered this encounter  Medications   metFORMIN (GLUCOPHAGE) 500 MG tablet    Sig: Take 1 tablet (500 mg total) by mouth daily with breakfast.    Dispense:  90 tablet    Refill:  2   tamsulosin (FLOMAX) 0.4 MG CAPS capsule    Sig: Take 2 capsules (0.8 mg total) by mouth daily after supper.    Dispense:  180 capsule    Refill:  1   DISCONTD: zolpidem (AMBIEN) 10 MG tablet    Sig: Take 1 tablet (10 mg total) by mouth at bedtime as needed for sleep.    Dispense:  90 tablet    Refill:  1   zolpidem (AMBIEN) 10 MG tablet    Sig: Take 1 tablet (10 mg total) by mouth at bedtime as needed for sleep.    Dispense:  90 tablet    Refill:  1     Follow-up: Return in about 3 months (around 08/21/2021).  [name], M.D.

## 2021-05-21 NOTE — Patient Outreach (Signed)
Cristian Moore) Care Management  05/21/2021  Cristian Moore 18-May-1941 [id number]   No telephone outreach to patient. Hollice Espy obtained mRS successfully on 05/11/21. MRS= 2   [place] Care Management Assistant

## 2021-05-22 DIAGNOSIS — M79674 Pain in right toe(s): Secondary | ICD-10-CM | POA: Diagnosis not present

## 2021-05-22 DIAGNOSIS — L03031 Cellulitis of right toe: Secondary | ICD-10-CM | POA: Diagnosis not present

## 2021-05-22 DIAGNOSIS — B351 Tinea unguium: Secondary | ICD-10-CM | POA: Diagnosis not present

## 2021-05-22 LAB — CMP14+EGFR
ALT: 21 IU/L (ref 0–44)
AST: 24 IU/L (ref 0–40)
Albumin/Globulin Ratio: 2 (ref 1.2–2.2)
Albumin: 4.2 g/dL (ref 3.6–4.6)
Alkaline Phosphatase: 49 IU/L (ref 44–121)
BUN/Creatinine Ratio: 11 (ref 10–24)
BUN: 14 mg/dL (ref 8–27)
Bilirubin Total: 0.3 mg/dL (ref 0.0–1.2)
CO2: 26 mmol/L (ref 20–29)
Calcium: 10.1 mg/dL (ref 8.6–10.2)
Chloride: 100 mmol/L (ref 96–106)
Creatinine, Ser: 1.23 mg/dL (ref 0.76–1.27)
Globulin, Total: 2.1 g/dL (ref 1.5–4.5)
Glucose: 104 mg/dL — ABNORMAL HIGH (ref 70–99)
Potassium: 4.7 mmol/L (ref 3.5–5.2)
Sodium: 138 mmol/L (ref 134–144)
Total Protein: 6.3 g/dL (ref 6.0–8.5)
eGFR: 59 mL/min/{1.73_m2} — ABNORMAL LOW (ref >59)

## 2021-05-22 LAB — LIPID PANEL
Chol/HDL Ratio: 4.2 ratio (ref 0.0–5.0)
Cholesterol, Total: 165 mg/dL (ref 100–199)
HDL: 39 mg/dL — ABNORMAL LOW (ref >39)
LDL Chol Calc (NIH): 78 mg/dL (ref 0–99)
Triglycerides: 293 mg/dL — ABNORMAL HIGH (ref 0–149)
VLDL Cholesterol Cal: 48 mg/dL — ABNORMAL HIGH (ref 5–40)

## 2021-05-22 LAB — CBC WITH DIFFERENTIAL/PLATELET
Basophils Absolute: 0.1 10*3/uL (ref 0.0–0.2)
Basos: 2 %
EOS (ABSOLUTE): 0.2 10*3/uL (ref 0.0–0.4)
Eos: 3 %
Hematocrit: 46.4 % (ref 37.5–51.0)
Hemoglobin: 15.6 g/dL (ref 13.0–17.7)
Immature Grans (Abs): 0.1 10*3/uL (ref 0.0–0.1)
Immature Granulocytes: 1 %
Lymphocytes Absolute: 2.2 10*3/uL (ref 0.7–3.1)
Lymphs: 27 %
MCH: 32.3 pg (ref 26.6–33.0)
MCHC: 33.6 g/dL (ref 31.5–35.7)
MCV: 96 fL (ref 79–97)
Monocytes Absolute: 1.1 10*3/uL — ABNORMAL HIGH (ref 0.1–0.9)
Monocytes: 13 %
Neutrophils Absolute: 4.3 10*3/uL (ref 1.4–7.0)
Neutrophils: 54 %
Platelets: 297 10*3/uL (ref 150–450)
RBC: 4.83 x10E6/uL (ref 4.14–5.80)
RDW: 11.9 % (ref 11.6–15.4)
WBC: 8 10*3/uL (ref 3.4–10.8)

## 2021-05-22 LAB — VITAMIN D 25 HYDROXY (VIT D DEFICIENCY, FRACTURES): Vit D, 25-Hydroxy: 40.2 ng/mL (ref 30.0–100.0)

## 2021-05-22 LAB — PSA, TOTAL AND FREE
PSA, Free Pct: 40 %
PSA, Free: 0.36 ng/mL
Prostate Specific Ag, Serum: 0.9 ng/mL (ref 0.0–4.0)

## 2021-05-22 LAB — TSH+FREE T4
Free T4: 0.78 ng/dL — ABNORMAL LOW (ref 0.82–1.77)
TSH: 0.664 u[IU]/mL (ref 0.450–4.500)

## 2021-05-31 ENCOUNTER — Encounter: Payer: Self-pay | Admitting: Cardiology

## 2021-05-31 NOTE — Progress Notes (Signed)
Cardiology Office Note   Date:  06/01/2021   ID:  Cristian Moore, DOB June 24, 1941, MRN [id number]  PCP:  [name], MD  Cardiologist:   [name], MD  Referring:   [name], MD   Chief Complaint  Patient presents with   Shortness of Breath       History of Present Illness: Cristian Moore is a 80 y.o. male who presents for follow up of a  history of DM, nonobstructive CAD by cath 2014. In 2017 he  had a [employer] which demonstrated an EF of 48% with a defect present in the basal inferior and mid inferior location. This is most likely due to artifact but  inferior ischemia could not be ruled out.  In August he was in the hospital with a CVA.  I reviewed these records for this visit.    He had some facial droop and dysarthria.  This was thought to be small vessel disease and less likely embolic but he was treated with tPA and had resolution of his symptoms.  MRI of the neck and brain demonstrated no acute abnormalities.  Echo demonstrated a low normal EF of 50 - 55%.  There were no significant valvular abnormalities.  He was treated with Plavix and aspirin.  Plavix was discontinued at 3 weeks per neurology.  I did review these records.    Patient's had no new neurologic symptoms since then.  He has some chronic dyspnea on exertion but he thinks that his is worse since his stroke.  He gets around but is not actively exercising.  He does not chores of daily living and he says walking a moderate distance on level ground will make him short of breath.  He is not having any PND or orthopnea.  He is not having any chest pressure, neck or arm discomfort. He does not have palpitations, presyncope or syncope.      Past Medical History:  Diagnosis Date   Allergy    Arthritis    Asthma    Benign prostatic hypertrophy    Cataract    mild   Chest pain    a. 01/2013 Cath: LM nl, LAD 25p, D1 25p, LCX nl, RCA 25p, PDA nl, RPL nl, EF 55%;  b. 03/2015 MV: EF 59%, no ischemia;  c. 03/2016 MV: basal inf/mid inf defect, likely artifact, EF 48%, low risk.   Chronic Dyspnea on exertion    Diabetes mellitus without complication ([place]) 4196   Diverticulitis    Diverticulosis    Exogenous obesity    GERD (gastroesophageal reflux disease)    moe like heart burn   H/O blepharoplasty 07/27/2013   H/O hiatal hernia    Hernia    Umbulical    History of adenomatous polyp of colon 1991   Hyperlipidemia    Hypothyroidism 08/23/2019   Laceration 01/11/2009   Deep laceration left index finger dorsally   Musculoskeletal pain    in the right shoulder - S/P ROTATOR CUFF REPAIR-BUT STILL HAS PAIN   Obstructive sleep apnea     CPAP    AUTO SET 6 TO 20 - USUALLY SETTLES OUT AT 10   Rectal bleeding    PT ATTRIBUTES TO HEMORRHOIDS   Stroke ([place])    SUPRAVENTRICULAR TACHYCARDIA 08/01/2009   Qualifier: Diagnosis of  By: Lovette Cliche CNA, [place]     Thyroiditis    Ulcer 2008    Past Surgical History:  Procedure Laterality Date   COLON SURGERY  07/2012  COLONOSCOPY     for polyps   ELBOW BURSA SURGERY     Eyelid Surgery Bilateral 05/2013   INGUINAL HERNIA REPAIR     KIDNEY STONE SURGERY     KNEE SURGERY     meniscus right knee   LAPAROSCOPIC SIGMOID COLECTOMY  07/23/2012   Procedure: LAPAROSCOPIC SIGMOID COLECTOMY;  Surgeon: [name], MD;  Location: [facility];  Service: General;  Laterality: N/A;  Laparoscopic Sigmoid Colectomy   LEFT HEART CATHETERIZATION WITH CORONARY ANGIOGRAM N/A 02/22/2013   Procedure: LEFT HEART CATHETERIZATION WITH CORONARY ANGIOGRAM;  Surgeon: [name], MD;  Location: [facility];  Service: Cardiovascular;  Laterality: N/A;   PROCTOSCOPY  07/23/2012   Procedure: PROCTOSCOPY;  Surgeon: [name], MD;  Location: [facility];  Service: General;  Laterality: N/A;  Rigid Proctoscopy   ROTATOR CUFF REPAIR     TONSILLECTOMY     TOTAL HIP ARTHROPLASTY Right 05/27/2018   Procedure: RIGHT TOTAL HIP ARTHROPLASTY ANTERIOR APPROACH;  Surgeon: [name], MD;  Location: [facility];  Service: Orthopedics;  Laterality: Right;  [id number]   UMBILICAL HERNIA REPAIR  07/23/2012   Procedure: HERNIA REPAIR UMBILICAL ADULT;  Surgeon: [name], MD;  Location: [facility];  Service: General;  Laterality: N/A;  Primary Umbilical Hernia Repair     Current Outpatient Medications  Medication Sig Dispense Refill   acetaminophen (TYLENOL) 500 MG tablet Take 1,000 mg by mouth every 6 (six) hours as needed for moderate pain.     albuterol (VENTOLIN HFA) 108 (90 Base) MCG/ACT inhaler Inhale 2 puffs into the lungs every 6 (six) hours as needed for wheezing or shortness of breath. 18 g 3   aspirin EC 81 MG tablet Take 81 mg by mouth daily. Swallow whole.     calcium carbonate (OS-CAL) 1250 (500 Ca) MG chewable tablet CHEW TWO TABLETS BY MOUTH DAILY     Cholecalciferol (VITAMIN D) 50 MCG (2000 UT) tablet Take 2,000 Units by mouth daily.     clopidogrel (PLAVIX) 75 MG tablet Take 1 tablet (75 mg total) by mouth daily. 30 tablet 0   diclofenac Sodium (VOLTAREN) 1 % GEL Apply 2 g topically 4 (four) times daily as needed (shoulders/knees/back pain).     escitalopram (LEXAPRO) 10 MG tablet Take 1 tablet (10 mg total) by mouth at bedtime. 90 tablet 3   famotidine (PEPCID) 10 MG tablet Take 10 mg by mouth 2 (two) times daily as needed for heartburn or indigestion.     fluticasone (FLONASE) 50 MCG/ACT nasal spray Place 2 sprays into both nostrils daily. 48 g 2   glucose blood (ONETOUCH VERIO) test strip USE TO CHECK BLOOD SUGAR TWICE DAILY AND AS NEEDED 200 each 3   HYDROcodone-acetaminophen (NORCO/VICODIN) 5-325 MG tablet Take 0.5 tablets by mouth every 6 (six) hours as needed for moderate pain.     hydroxypropyl methylcellulose / hypromellose (ISOPTO TEARS / GONIOVISC) 2.5 % ophthalmic solution Place 1 drop into both eyes 4 (four) times daily as needed for dry eyes.     Krill Oil 350 MG CAPS Take 350 mg by mouth daily.     metFORMIN (GLUCOPHAGE) 500 MG tablet Take 1 tablet  (500 mg total) by mouth daily with breakfast. 90 tablet 2   metoprolol tartrate (LOPRESSOR) 50 MG tablet Take 0.5 tablets (25 mg total) by mouth 2 (two) times daily. 90 tablet 3   montelukast (SINGULAIR) 10 MG tablet TAKE 1 TABLET AT BEDTIME 90 tablet 1   Multiple Vitamins-Minerals (CENTRUM  SILVER PO) Take 1 tablet by mouth daily.     naproxen (NAPROSYN) 500 MG tablet Take 1 tablet (500 mg total) by mouth 2 (two) times daily as needed for moderate pain. 10 tablet 0   nitroGLYCERIN (NITROSTAT) 0.4 MG SL tablet DISSOLVE ONE TABLET UNDER THE TONGUE EVERY 5 MINUTES AS NEEDED FOR CHEST PAIN.  DO NOT EXCEED A TOTAL OF 3 DOSES IN 15 MINUTES (Patient taking differently: Place 0.4 mg under the tongue every 5 (five) minutes as needed for chest pain.) 25 tablet 11   PRESCRIPTION MEDICATION Inhale 1 application into the lungs at bedtime. "CPAP" MACHINE     simvastatin (ZOCOR) 20 MG tablet Take 1 tablet (20 mg total) by mouth at bedtime. 90 tablet 3   sucralfate (CARAFATE) 1 g tablet Take 1 tablet (1 g total) by mouth daily as needed (stomach pain). 90 tablet 3   tamsulosin (FLOMAX) 0.4 MG CAPS capsule Take 2 capsules (0.8 mg total) by mouth daily after supper. 180 capsule 1   zolpidem (AMBIEN) 10 MG tablet Take 1 tablet (10 mg total) by mouth at bedtime as needed for sleep. 90 tablet 1   No current facility-administered medications for this visit.    Allergies:   Penicillins    ROS:  Please see the history of present illness.   Otherwise, review of systems are positive for .   All other systems are reviewed and negative.    PHYSICAL EXAM: VS:  BP 130/76   Pulse 65   Ht 6\' 1"  (1.854 m)   Wt 278 lb 3.2 oz (126.2 kg)   SpO2 98%   BMI 36.70 kg/m  , BMI Body mass index is 36.7 kg/m. GENERAL:  Well appearing NECK:  No jugular venous distention, waveform within normal limits, carotid upstroke brisk and symmetric, no bruits, no thyromegaly LUNGS:  Clear to auscultation bilaterally CHEST:   Unremarkable HEART:  PMI not displaced or sustained,S1 and S2 within normal limits, no S3, no S4, no clicks, no rubs, no murmurs ABD:  Flat, positive bowel sounds normal in frequency in pitch, no bruits, no rebound, no guarding, no midline pulsatile mass, no hepatomegaly, no splenomegaly EXT:  2 plus pulses throughout, no edema, no cyanosis no clubbing   EKG:  EKG  ordered 04/30/2021 today. Sinus rhythm, rate 67, axis within normal limits, intervals, no acute ST-T wave changes.    Recent Labs: 05/21/2021: ALT 21; BUN 14; Creatinine, Ser 1.23; Hemoglobin 15.6; Platelets 297; Potassium 4.7; Sodium 138; TSH 0.664    Lipid Panel    Component Value Date/Time   CHOL 165 05/21/2021 1053   TRIG 293 (H) 05/21/2021 1053   TRIG 111 01/22/2017 1027   HDL 39 (L) 05/21/2021 1053   HDL 39 (L) 01/22/2017 1027   CHOLHDL 4.2 05/21/2021 1053   CHOLHDL 2.5 02/12/2021 0049   VLDL 21 02/12/2021 0049   LDLCALC 78 05/21/2021 1053   LDLCALC 89 01/31/2014 1010      Wt Readings from Last 3 Encounters:  06/01/21 278 lb 3.2 oz (126.2 kg)  05/21/21 276 lb 6.4 oz (125.4 kg)  04/18/21 282 lb (127.9 kg)      Other studies Reviewed: Additional studies/ records that were reviewed today include: Hospital records Review of the above records demonstrates:   See elsewhere   ASSESSMENT AND PLAN:   CAD:    He does have shortness of breath which might be an anginal equivalent.  I suspect is probably multifactorial and in part related to deconditioning and  weight.  I will screen him with a Lexiscan Myoview.  SOB:  This will be evaluated as above.  I will be checking a BNP.  HTN:   Blood pressure is at target.  No change in therapy.    DYSLIPIDEMIA:    LDL was 78 with HDL of 39.  No change in therapy.   CVA: I did review extensively his hospital and neurology office notes.  There was no suggestion of an embolic etiology and so I am not considering a monitor at this point.  SLEEP APNEA: He uses  CPAP.    Current medicines are reviewed at length with the patient today.  The patient does not have concerns regarding medicines.  The following changes have been made:  None  Labs/ tests ordered today include:     Orders Placed This Encounter  Procedures   Brain natriuretic peptide   MYOCARDIAL PERFUSION IMAGING    Follow up me in 12 months.      Signed, [name], MD  06/01/2021 12:53 PM    [facility]

## 2021-06-01 ENCOUNTER — Other Ambulatory Visit: Payer: Self-pay

## 2021-06-01 ENCOUNTER — Ambulatory Visit (INDEPENDENT_AMBULATORY_CARE_PROVIDER_SITE_OTHER): Payer: Medicare HMO | Admitting: Cardiology

## 2021-06-01 ENCOUNTER — Encounter: Payer: Self-pay | Admitting: Cardiology

## 2021-06-01 VITALS — BP 130/76 | HR 65 | Ht 73.0 in | Wt 278.2 lb

## 2021-06-01 DIAGNOSIS — E785 Hyperlipidemia, unspecified: Secondary | ICD-10-CM

## 2021-06-01 DIAGNOSIS — I10 Essential (primary) hypertension: Secondary | ICD-10-CM | POA: Diagnosis not present

## 2021-06-01 DIAGNOSIS — R0602 Shortness of breath: Secondary | ICD-10-CM | POA: Diagnosis not present

## 2021-06-01 DIAGNOSIS — I2510 Atherosclerotic heart disease of native coronary artery without angina pectoris: Secondary | ICD-10-CM

## 2021-06-01 NOTE — Patient Instructions (Signed)
Medication Instructions:  Your Physician recommend you continue on your current medication as directed.    *If you need a refill on your cardiac medications before your next appointment, please call your pharmacy*    Testing/Procedures: Your physician has requested that you have a lexiscan myoview. For further information please visit [email or web address]. Please follow instruction sheet, as given.    Follow-Up: At [facility], you and your health needs are our priority.  As part of our continuing mission to provide you with exceptional heart care, we have created designated Provider Care Teams.  These Care Teams include your primary Cardiologist (physician) and Advanced Practice Providers (APPs -  Physician Assistants and Nurse Practitioners) who all work together to provide you with the care you need, when you need it.  We recommend signing up for the patient portal called "MyChart".  Sign up information is provided on this After Visit Summary.  MyChart is used to connect with patients for Virtual Visits (Telemedicine).  Patients are able to view lab/test results, encounter notes, upcoming appointments, etc.  Non-urgent messages can be sent to your provider as well.   To learn more about what you can do with MyChart, go to [email or web address].    Your next appointment:   12 month(s)  The format for your next appointment:   In Person  Provider:   [name], MD

## 2021-06-02 LAB — BRAIN NATRIURETIC PEPTIDE: BNP: 3.4 pg/mL (ref 0.0–100.0)

## 2021-06-07 ENCOUNTER — Encounter: Payer: Self-pay | Admitting: *Deleted

## 2021-06-07 ENCOUNTER — Telehealth (HOSPITAL_COMMUNITY): Payer: Self-pay | Admitting: *Deleted

## 2021-06-07 NOTE — Telephone Encounter (Signed)
Close encounter 

## 2021-06-08 ENCOUNTER — Ambulatory Visit (HOSPITAL_COMMUNITY)
Admission: RE | Admit: 2021-06-08 | Discharge: 2021-06-08 | Disposition: A | Discharge status: Home / Self Care | Payer: Medicare HMO | Source: Ambulatory Visit | Attending: Cardiovascular Disease | Admitting: Cardiovascular Disease

## 2021-06-08 ENCOUNTER — Other Ambulatory Visit: Payer: Self-pay

## 2021-06-08 DIAGNOSIS — I2510 Atherosclerotic heart disease of native coronary artery without angina pectoris: Secondary | ICD-10-CM | POA: Diagnosis not present

## 2021-06-08 DIAGNOSIS — E785 Hyperlipidemia, unspecified: Secondary | ICD-10-CM

## 2021-06-08 DIAGNOSIS — I10 Essential (primary) hypertension: Secondary | ICD-10-CM

## 2021-06-08 DIAGNOSIS — R0602 Shortness of breath: Secondary | ICD-10-CM

## 2021-06-08 LAB — MYOCARDIAL PERFUSION IMAGING
LV dias vol: 96 mL (ref 62–150)
LV sys vol: 42 mL
Nuc Stress EF: 56 %
Peak HR: 75 {beats}/min
Rest HR: 53 {beats}/min
Rest Nuclear Isotope Dose: 9.7 mCi
SDS: 1
SRS: 0
SSS: 1
ST Depression (mm): 0 mm
Stress Nuclear Isotope Dose: 32.3 mCi
TID: 0.99

## 2021-06-08 MED ORDER — TECHNETIUM TC 99M TETROFOSMIN IV KIT
9.7000 | PACK | Freq: Once | INTRAVENOUS | Status: AC | PRN
  Administered 2021-06-08: 9.7 via INTRAVENOUS
  Filled 2021-06-08: qty 10

## 2021-06-08 MED ORDER — TECHNETIUM TC 99M TETROFOSMIN IV KIT
32.3000 | PACK | Freq: Once | INTRAVENOUS | Status: AC | PRN
  Administered 2021-06-08: 32.3 via INTRAVENOUS
  Filled 2021-06-08: qty 33

## 2021-06-08 MED ORDER — REGADENOSON 0.4 MG/5ML IV SOLN
0.4000 mg | Freq: Once | INTRAVENOUS | Status: AC
  Administered 2021-06-08: 0.4 mg via INTRAVENOUS

## 2021-06-12 DIAGNOSIS — M79674 Pain in right toe(s): Secondary | ICD-10-CM | POA: Diagnosis not present

## 2021-06-12 DIAGNOSIS — L03031 Cellulitis of right toe: Secondary | ICD-10-CM | POA: Diagnosis not present

## 2021-06-14 ENCOUNTER — Encounter: Payer: Self-pay | Admitting: *Deleted

## 2021-06-18 ENCOUNTER — Ambulatory Visit: Payer: No Typology Code available for payment source | Admitting: Cardiology

## 2021-07-05 DIAGNOSIS — M170 Bilateral primary osteoarthritis of knee: Secondary | ICD-10-CM | POA: Diagnosis not present

## 2021-07-05 DIAGNOSIS — M25552 Pain in left hip: Secondary | ICD-10-CM | POA: Diagnosis not present

## 2021-07-09 ENCOUNTER — Other Ambulatory Visit: Payer: Self-pay

## 2021-07-09 ENCOUNTER — Encounter: Payer: Self-pay | Admitting: Adult Health

## 2021-07-09 ENCOUNTER — Ambulatory Visit (INDEPENDENT_AMBULATORY_CARE_PROVIDER_SITE_OTHER): Payer: Medicare HMO | Admitting: Adult Health

## 2021-07-09 VITALS — BP 132/74 | HR 61 | Ht 73.0 in | Wt 274.0 lb

## 2021-07-09 DIAGNOSIS — I6381 Other cerebral infarction due to occlusion or stenosis of small artery: Secondary | ICD-10-CM

## 2021-07-09 NOTE — Progress Notes (Signed)
[facility] [address] [place]. [place] [address] [phone]       OFFICE FOLLOW-UP NOTE  [name] Date of Birth:  January 10, 1941 Medical Record Number:  [id number]   Primary neurologist: Dr. Leonie Man PCP: [name], MD   Chief Complaint  Patient presents with   Follow-up    Rm 3 with wife here for f/u reports he has been well. Pt has finished physical therapy, trouble with word recall is still present.       HPI:   Update 07/09/2021 JM: Returns for 4 month stroke f/u. Overall stable. No new stroke/TIA symptoms. Reports occasional word finding difficulty/delayed word recall but this was present prior to stroke - per wife, possibly worsened post stroke. Completed HH SLP with improvement noted. Completed PT for imbalance with improvement. Continued use of cane - can does b/l knee pain looking at getting replacements. Questions duration of procedure post stroke.  Compliant on aspirin and simvastatin without side effects.  Blood pressure today 132/74 - occasionally monitors at home which has been stable.  Glucose levels monitored at home typically stable with A1c 6.0 back in November. Nightly use of CPAP for OSA management. No further concerns at this time.   History provided for reference purposes only Initial visit 02/19/2021 Dr. Leonie Man: [name] is a 81 year old Caucasian male seen today for initial office follow-up visit following hospital admission for possible stroke and [place] 2022.  Is accompanied by his wife.  History is obtained from them, review of electronic medical records and I personally reviewed pertinent imaging films in PACS.  He has past medical history of hypertension, hyperlipidemia, coronary artery disease, diabetes, obesity, sleep apnea, gastroesophageal reflux disease and chronic cough.  He presented on 02/11/2021 with sudden onset of right sided numbness, facial droop and dysarthria.  NIH stroke scale was 2 on admission.  CT scan of the  head was unremarkable with aspect score of 10.  CT angiogram of the brain and neck but did not reveal significant extracranial intracranial stenosis.  Patient was given IV tPA after discussion of risk-benefit.  His symptoms resolved shortly after tPA administration.  He had close neurological observation with strict control of blood pressure as per post tPA protocol.  2D echo showed ejection fraction 50 to 56% grade 1 diastolic dysfunction.  LDL cholesterol was 54 mg percent and hemoglobin A1c was 6.2%.  Telemetry monitoring did not reveal cardiac arrhythmias.  Patient did well and was discharged home and advised to take dual antiplatelet therapy of aspirin and Plavix for 3 weeks followed by aspirin alone.  He is tolerating this well but does complain of bruising.  He fell out of bed 1 night and has a left periorbital bruise.  He states his blood pressures well controlled and today it is 117/65.  He has new complaint of short-term memory difficulties which he admits to existed even before his stroke.  He can remember things later but not in the spur of the moment.  He finds this frustrating.  He does take Krill oil every day but does not participate in mentally challenging activities on a regular basis.    ROS:   14 system review of systems is positive for those listed in HPI and all other systems negative  PMH:  Past Medical History:  Diagnosis Date   Allergy    Arthritis    Asthma    Benign prostatic hypertrophy    Cataract    mild   Chest pain  a. 01/2013 Cath: LM nl, LAD 25p, D1 25p, LCX nl, RCA 25p, PDA nl, RPL nl, EF 55%;  b. 03/2015 MV: EF 59%, no ischemia; c. 03/2016 MV: basal inf/mid inf defect, likely artifact, EF 48%, low risk.   Chronic Dyspnea on exertion    Diabetes mellitus without complication ([place]) 3016   Diverticulitis    Diverticulosis    Exogenous obesity    GERD (gastroesophageal reflux disease)    moe like heart burn   H/O blepharoplasty 07/27/2013   H/O hiatal hernia     Hernia    Umbulical    History of adenomatous polyp of colon 1991   Hyperlipidemia    Hypothyroidism 08/23/2019   Laceration 01/11/2009   Deep laceration left index finger dorsally   Musculoskeletal pain    in the right shoulder - S/P ROTATOR CUFF REPAIR-BUT STILL HAS PAIN   Obstructive sleep apnea     CPAP    AUTO SET 6 TO 20 - USUALLY SETTLES OUT AT 10   Rectal bleeding    PT ATTRIBUTES TO HEMORRHOIDS   Stroke ([place])    SUPRAVENTRICULAR TACHYCARDIA 08/01/2009   Qualifier: Diagnosis of  By: Lovette Cliche, CNA, [place]     Thyroiditis    Ulcer 2008    Social History:  Social History   Socioeconomic History   Marital status: Married    Spouse name: Alice   Number of children: 4   Years of education: 13   Highest education level: Some college, no degree  Occupational History   Occupation: Retired  Tobacco Use   Smoking status: Former    Types: Cigars    Quit date: 08/12/1999    Years since quitting: 21.9   Smokeless tobacco: Never   Tobacco comments:    smoked cigars on and off for approx 4-5 yrs  Vaping Use   Vaping Use: Never used  Substance and Sexual Activity   Alcohol use: No   Drug use: No   Sexual activity: Yes  Other Topics Concern   Not on file  Social History Narrative   Lives at home with wife      [facility] (09/16/16):   Originally from [facility]. Previously served & lived in [place], [place], & [place]. 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 He also worked in [employer]. No pets currently. Remote bird exposure. No mold or hot tub exposure. Enjoys playing banjo as well as hunting. He also raises cattle. Has carpet in his bedroom. No feather bedding. No indoor plants.    Social Determinants of Health   Financial Resource Strain: Low Risk    Difficulty of Paying Living Expenses: Not hard at all  Food Insecurity: No Food Insecurity   Worried About Charity fundraiser in the Last Year: Never true    [place] in the Last Year: Never true  Transportation Needs: No Transportation Needs   Lack of Transportation (Medical): No   Lack of Transportation (Non-Medical): No  Physical Activity: Not on file  Stress: Not on file  Social Connections: Not on file  Intimate Partner Violence: Not on file    Medications:   Current Outpatient Medications on File Prior to Visit  Medication Sig Dispense Refill   acetaminophen (TYLENOL) 500 MG tablet Take 1,000 mg by mouth every 6 (six) hours as needed for moderate pain.     albuterol (VENTOLIN HFA) 108 (90 Base) MCG/ACT inhaler  Inhale 2 puffs into the lungs every 6 (six) hours as needed for wheezing or shortness of breath. 18 g 3   aspirin EC 81 MG tablet Take 81 mg by mouth daily. Swallow whole.     calcium carbonate (OS-CAL) 1250 (500 Ca) MG chewable tablet CHEW TWO TABLETS BY MOUTH DAILY     Cholecalciferol (VITAMIN D) 50 MCG (2000 UT) tablet Take 2,000 Units by mouth daily.     clopidogrel (PLAVIX) 75 MG tablet Take 1 tablet (75 mg total) by mouth daily. 30 tablet 0   diclofenac Sodium (VOLTAREN) 1 % GEL Apply 2 g topically 4 (four) times daily as needed (shoulders/knees/back pain).     escitalopram (LEXAPRO) 10 MG tablet Take 1 tablet (10 mg total) by mouth at bedtime. 90 tablet 3   famotidine (PEPCID) 10 MG tablet Take 10 mg by mouth 2 (two) times daily as needed for heartburn or indigestion.     fluticasone (FLONASE) 50 MCG/ACT nasal spray Place 2 sprays into both nostrils daily. 48 g 2   glucose blood (ONETOUCH VERIO) test strip USE TO CHECK BLOOD SUGAR TWICE DAILY AND AS NEEDED 200 each 3   HYDROcodone-acetaminophen (NORCO/VICODIN) 5-325 MG tablet Take 0.5 tablets by mouth every 6 (six) hours as needed for moderate pain.     hydroxypropyl methylcellulose / hypromellose (ISOPTO TEARS / GONIOVISC) 2.5 % ophthalmic solution Place 1 drop into both eyes 4 (four) times daily as needed for dry eyes.     Krill Oil 350 MG CAPS Take 350 mg by mouth  daily.     metFORMIN (GLUCOPHAGE) 500 MG tablet Take 1 tablet (500 mg total) by mouth daily with breakfast. 90 tablet 2   metoprolol tartrate (LOPRESSOR) 50 MG tablet Take 0.5 tablets (25 mg total) by mouth 2 (two) times daily. 90 tablet 3   montelukast (SINGULAIR) 10 MG tablet TAKE 1 TABLET AT BEDTIME 90 tablet 1   Multiple Vitamins-Minerals (CENTRUM SILVER PO) Take 1 tablet by mouth daily.     nitroGLYCERIN (NITROSTAT) 0.4 MG SL tablet DISSOLVE ONE TABLET UNDER THE TONGUE EVERY 5 MINUTES AS NEEDED FOR CHEST PAIN.  DO NOT EXCEED A TOTAL OF 3 DOSES IN 15 MINUTES (Patient taking differently: Place 0.4 mg under the tongue every 5 (five) minutes as needed for chest pain.) 25 tablet 11   PRESCRIPTION MEDICATION Inhale 1 application into the lungs at bedtime. "CPAP" MACHINE     simvastatin (ZOCOR) 20 MG tablet Take 1 tablet (20 mg total) by mouth at bedtime. 90 tablet 3   sucralfate (CARAFATE) 1 g tablet Take 1 tablet (1 g total) by mouth daily as needed (stomach pain). 90 tablet 3   tamsulosin (FLOMAX) 0.4 MG CAPS capsule Take 2 capsules (0.8 mg total) by mouth daily after supper. 180 capsule 1   zolpidem (AMBIEN) 10 MG tablet Take 1 tablet (10 mg total) by mouth at bedtime as needed for sleep. 90 tablet 1   No current facility-administered medications on file prior to visit.    Allergies:   Allergies  Allergen Reactions   Penicillins Rash and Hives    Over 60 years ago Has patient had a PCN reaction causing immediate rash, facial/tongue/throat swelling, SOB or lightheadedness with hypotension: Unknown Has patient had a PCN reaction causing severe rash involving mucus membranes or skin necrosis: Unknown Has patient had a PCN reaction that required hospitalization: No Has patient had a PCN reaction occurring within the last 10 years: No If all of the above answers are "  NO", then may proceed with Cephalosporin use.     Physical Exam Today's Vitals   07/09/21 1037  BP: 132/74  Pulse: 61   SpO2: 95%  Weight: 274 lb (124.3 kg)  Height: 6\' 1"  (1.854 m)   Body mass index is 36.15 kg/m.   General: Obese very pleasant elderly Caucasian male, seated, in no evident distress Head: head normocephalic and atraumatic.  Neck: supple with no carotid or supraclavicular bruits Cardiovascular: regular rate and rhythm, no murmurs Musculoskeletal: no deformity Skin:  no rash/petichiae .left periorbital bruise Vascular:  Normal pulses all extremities  Neurologic Exam Mental Status: Awake and fully alert. Unable to appreciate aphasia or dysarthria. Oriented to place and time. Recent and remote memory intact. Attention span, concentration and fund of knowledge appropriate. Mood and affect appropriate.  Cranial Nerves: Pupils equal, briskly reactive to light. Extraocular movements full without nystagmus. Visual fields full to confrontation. Hearing intact. Facial sensation intact. Face, tongue, palate moves normally and symmetrically.  Motor: Normal bulk and tone. Normal strength in all tested extremity muscles. Sensory.: intact to touch ,pinprick .position and vibratory sensation.  Coordination: Rapid alternating movements normal in all extremities. Finger-to-nose and heel-to-shin performed accurately bilaterally. Gait and Station: Arises from chair without difficulty. Stance is normal. Gait demonstrates normal stride length and balance with use of cane.  Unable to heel, toe and tandem walk  Reflexes: 1+ and symmetric. Toes downgoing.      ASSESSMENT/PLAN: KAVARI PARRILLO is a 81 year old Caucasian male with sudden onset of facial droop and right sided numbness likely due to small left subcortical lacunar stroke not seen on CT scan x2 treated with IV tPA with residual word finding/delayed word finding (present prior to stroke, ?worsened post stroke).  Patient unable to tolerate MRI.  Vascular risk factors of diabetes, hypertension, obesity and hyperlipidemia.     Small left subcortical  stroke not seen on imaging Word finding - discussed potential benefit with SLP but declines at this time.  Continue aspirin and simvastatin 20 mg daily for secondary stroke prevention - request rx refills by PCP Close PCP follow-up for aggressive stroke risk factor management including HTN with BP goal<130/90, HLD with LDL goal<70 and DM with A1c goal<7 B/l knee arthritis  Discussed awaiting 3-6 months post stroke prior to any elective procedure for possible knee replacements. Has f/u with ortho 2/2 to further discuss. After 6 months post stroke (08/2021), okay to told aspirin 3-5 days prior with small but acceptable preprocedure risk of recurrent stroke while off therapy. Recommend restarting immediately after or once hemodynamically stable   Follow-up in 6 months or call earlier if needed - if remains stable, can follow up here as needed   CC:  [name], MD   I spent 34 minutes of face-to-face and non-face-to-face time with patient and wife.  This included previsit chart review, lab review, study review, electronic health record documentation, patient education and discussion regarding prior stroke and reported residual deficits, secondary stroke prevention measures and aggressive stroke risk factor management, possible bilateral knee replacements as above  Frann Rider, AGNP-BC  [facility] [address] [place], [place] [address]  Phone [phone] Fax [phone] Note: This document was prepared with digital dictation and possible smart phrase technology. Any transcriptional errors that result from this process are unintentional.

## 2021-07-09 NOTE — Patient Instructions (Signed)
Continue aspirin 81 mg daily  and simvastatin  for secondary stroke prevention  Continue to follow up with PCP regarding cholesterol and blood pressure management  Maintain strict control of hypertension with blood pressure goal below 130/90 and cholesterol with LDL cholesterol (bad cholesterol) goal below 70 mg/dL.   Signs of a Stroke? Follow the BEFAST method:  Balance Watch for a sudden loss of balance, trouble with coordination or vertigo Eyes Is there a sudden loss of vision in one or both eyes? Or double vision?  Face: Ask the person to smile. Does one side of the face droop or is it numb?  Arms: Ask the person to raise both arms. Does one arm drift downward? Is there weakness or numbness of a leg? Speech: Ask the person to repeat a simple phrase. Does the speech sound slurred/strange? Is the person confused ? Time: If you observe any of these signs, call 911.    Followup in the future with me in 6 months or call earlier if needed       Thank you for coming to see [place] at [facility]. I hope we have been able to provide you high quality care today.  You may receive a patient satisfaction survey over the next few weeks. We would appreciate your feedback and comments so that we may continue to improve ourselves and the health of our patients.

## 2021-07-16 ENCOUNTER — Other Ambulatory Visit: Payer: Self-pay | Admitting: Family Medicine

## 2021-07-16 DIAGNOSIS — E118 Type 2 diabetes mellitus with unspecified complications: Secondary | ICD-10-CM

## 2021-08-02 DIAGNOSIS — M1711 Unilateral primary osteoarthritis, right knee: Secondary | ICD-10-CM | POA: Diagnosis not present

## 2021-08-02 DIAGNOSIS — M25562 Pain in left knee: Secondary | ICD-10-CM | POA: Diagnosis not present

## 2021-08-24 DIAGNOSIS — E1165 Type 2 diabetes mellitus with hyperglycemia: Secondary | ICD-10-CM | POA: Diagnosis not present

## 2021-08-24 DIAGNOSIS — K219 Gastro-esophageal reflux disease without esophagitis: Secondary | ICD-10-CM | POA: Diagnosis not present

## 2021-08-24 DIAGNOSIS — G4700 Insomnia, unspecified: Secondary | ICD-10-CM | POA: Diagnosis not present

## 2021-08-24 DIAGNOSIS — M545 Low back pain, unspecified: Secondary | ICD-10-CM | POA: Diagnosis not present

## 2021-08-24 DIAGNOSIS — J45909 Unspecified asthma, uncomplicated: Secondary | ICD-10-CM | POA: Diagnosis not present

## 2021-08-24 DIAGNOSIS — I471 Supraventricular tachycardia: Secondary | ICD-10-CM | POA: Diagnosis not present

## 2021-08-24 DIAGNOSIS — G8929 Other chronic pain: Secondary | ICD-10-CM | POA: Diagnosis not present

## 2021-08-24 DIAGNOSIS — R69 Illness, unspecified: Secondary | ICD-10-CM | POA: Diagnosis not present

## 2021-08-24 DIAGNOSIS — M1990 Unspecified osteoarthritis, unspecified site: Secondary | ICD-10-CM | POA: Diagnosis not present

## 2021-08-24 DIAGNOSIS — E785 Hyperlipidemia, unspecified: Secondary | ICD-10-CM | POA: Diagnosis not present

## 2021-08-24 DIAGNOSIS — H04129 Dry eye syndrome of unspecified lacrimal gland: Secondary | ICD-10-CM | POA: Diagnosis not present

## 2021-08-25 ENCOUNTER — Other Ambulatory Visit: Payer: Self-pay | Admitting: Family Medicine

## 2021-09-05 ENCOUNTER — Ambulatory Visit (INDEPENDENT_AMBULATORY_CARE_PROVIDER_SITE_OTHER): Payer: Medicare HMO

## 2021-09-05 VITALS — Wt 274.0 lb

## 2021-09-05 DIAGNOSIS — Z0000 Encounter for general adult medical examination without abnormal findings: Secondary | ICD-10-CM | POA: Diagnosis not present

## 2021-09-05 NOTE — Progress Notes (Signed)
Subjective:   Cristian Moore is a 81 y.o. male who presents for Medicare Annual/Subsequent preventive examination.  Virtual Visit via Telephone Note  I connected with  Cristian Moore on 09/05/21 at  2:45 PM EST by telephone and verified that I am speaking with the correct person using two identifiers.  Location: Patient: Home Provider: [facility] Persons participating in the virtual visit: patient/Nurse Health Advisor   I discussed the limitations, risks, security and privacy concerns of performing an evaluation and management service by telephone and the availability of in person appointments. The patient expressed understanding and agreed to proceed.  Interactive audio and video telecommunications were attempted between this nurse and patient, however failed, due to patient having technical difficulties OR patient did not have access to video capability.  We continued and completed visit with audio only.  Some vital signs may be absent or patient reported.   Cristian Moore E Aslan Himes, LPN   Review of Systems     Cardiac Risk Factors include: advanced age (>75mn, >>45women);diabetes mellitus;male gender;dyslipidemia;hypertension;obesity (BMI >30kg/m2);Other (see comment), Risk factor comments: hx of CVA, OSA on CPAP, atherosclerosis, emphysema     Objective:    Today's Vitals   09/05/21 1437  Weight: 274 lb (124.3 kg)  PainSc: 3    Body mass index is 36.15 kg/m.  Advanced Directives 09/05/2021 03/10/2021 09/04/2020 06/13/2020 08/30/2019 08/27/2018 05/27/2018  Does Patient Have a Medical Advance Directive? No No No No No No No  Would patient like information on creating a medical advance directive? No - Patient declined - No - Patient declined No - Patient declined No - Patient declined No - Patient declined No - Patient declined  Pre-existing out of facility DNR order (yellow form or pink MOST form) - - - - - - -    Current Medications (verified) Outpatient Encounter Medications as of  09/05/2021  Medication Sig   acetaminophen (TYLENOL) 500 MG tablet Take 1,000 mg by mouth every 6 (six) hours as needed for moderate pain.   albuterol (VENTOLIN HFA) 108 (90 Base) MCG/ACT inhaler Inhale 2 puffs into the lungs every 6 (six) hours as needed for wheezing or shortness of breath.   aspirin EC 81 MG tablet Take 81 mg by mouth daily. Swallow whole.   calcium carbonate (OS-CAL) 1250 (500 Ca) MG chewable tablet CHEW TWO TABLETS BY MOUTH DAILY   Cholecalciferol (VITAMIN D) 50 MCG (2000 UT) tablet Take 2,000 Units by mouth daily.   clopidogrel (PLAVIX) 75 MG tablet Take 1 tablet (75 mg total) by mouth daily.   diclofenac Sodium (VOLTAREN) 1 % GEL Apply 2 g topically 4 (four) times daily as needed (shoulders/knees/back pain).   escitalopram (LEXAPRO) 10 MG tablet Take 1 tablet (10 mg total) by mouth at bedtime.   famotidine (PEPCID) 10 MG tablet Take 10 mg by mouth 2 (two) times daily as needed for heartburn or indigestion.   fluticasone (FLONASE) 50 MCG/ACT nasal spray USE 2 SPRAYS IN EACH       NOSTRIL DAILY   glucose blood (ONETOUCH VERIO) test strip USE TO CHECK BLOOD SUGAR TWICE DAILY AND AS NEEDED   HYDROcodone-acetaminophen (NORCO/VICODIN) 5-325 MG tablet Take 0.5 tablets by mouth every 6 (six) hours as needed for moderate pain.   hydroxypropyl methylcellulose / hypromellose (ISOPTO TEARS / GONIOVISC) 2.5 % ophthalmic solution Place 1 drop into both eyes 4 (four) times daily as needed for dry eyes.   Krill Oil 350 MG CAPS Take 350 mg by mouth daily.  metFORMIN (GLUCOPHAGE) 500 MG tablet Take 1 tablet (500 mg total) by mouth daily with breakfast.   metoprolol tartrate (LOPRESSOR) 50 MG tablet Take 0.5 tablets (25 mg total) by mouth 2 (two) times daily.   montelukast (SINGULAIR) 10 MG tablet TAKE 1 TABLET AT BEDTIME   Multiple Vitamins-Minerals (CENTRUM SILVER PO) Take 1 tablet by mouth daily.   nitroGLYCERIN (NITROSTAT) 0.4 MG SL tablet DISSOLVE ONE TABLET UNDER THE TONGUE EVERY 5  MINUTES AS NEEDED FOR CHEST PAIN.  DO NOT EXCEED A TOTAL OF 3 DOSES IN 15 MINUTES (Patient taking differently: Place 0.4 mg under the tongue every 5 (five) minutes as needed for chest pain.)   PRESCRIPTION MEDICATION Inhale 1 application into the lungs at bedtime. "CPAP" MACHINE   simvastatin (ZOCOR) 20 MG tablet Take 1 tablet (20 mg total) by mouth at bedtime.   sucralfate (CARAFATE) 1 g tablet Take 1 tablet (1 g total) by mouth daily as needed (stomach pain).   tamsulosin (FLOMAX) 0.4 MG CAPS capsule Take 2 capsules (0.8 mg total) by mouth daily after supper.   zolpidem (AMBIEN) 10 MG tablet Take 1 tablet (10 mg total) by mouth at bedtime as needed for sleep.   No facility-administered encounter medications on file as of 09/05/2021.    Allergies (verified) Penicillins   History: Past Medical History:  Diagnosis Date   Allergy    Arthritis    Asthma    Benign prostatic hypertrophy    Cataract    mild   Chest pain    a. 01/2013 Cath: LM nl, LAD 25p, D1 25p, LCX nl, RCA 25p, PDA nl, RPL nl, EF 55%;  b. 03/2015 MV: EF 59%, no ischemia; c. 03/2016 MV: basal inf/mid inf defect, likely artifact, EF 48%, low risk.   Chronic Dyspnea on exertion    Diabetes mellitus without complication ([place]) 0175   Diverticulitis    Diverticulosis    Exogenous obesity    GERD (gastroesophageal reflux disease)    moe like heart burn   H/O blepharoplasty 07/27/2013   H/O hiatal hernia    Hernia    Umbulical    History of adenomatous polyp of colon 1991   Hyperlipidemia    Hypothyroidism 08/23/2019   Laceration 01/11/2009   Deep laceration left index finger dorsally   Musculoskeletal pain    in the right shoulder - S/P ROTATOR CUFF REPAIR-BUT STILL HAS PAIN   Obstructive sleep apnea     CPAP    AUTO SET 6 TO 20 - USUALLY SETTLES OUT AT 10   Rectal bleeding    PT ATTRIBUTES TO HEMORRHOIDS   Stroke ([place])    SUPRAVENTRICULAR TACHYCARDIA 08/01/2009   Qualifier: Diagnosis of  By: Lovette Cliche, CNA, [place]      Thyroiditis    Ulcer 2008   Past Surgical History:  Procedure Laterality Date   COLON SURGERY  07/2012   COLONOSCOPY     for polyps   ELBOW BURSA SURGERY     Eyelid Surgery Bilateral 05/2013   INGUINAL HERNIA REPAIR     KIDNEY STONE SURGERY     KNEE SURGERY     meniscus right knee   LAPAROSCOPIC SIGMOID COLECTOMY  07/23/2012   Procedure: LAPAROSCOPIC SIGMOID COLECTOMY;  Surgeon: [name], MD;  Location: [facility];  Service: General;  Laterality: N/A;  Laparoscopic Sigmoid Colectomy   LEFT HEART CATHETERIZATION WITH CORONARY ANGIOGRAM N/A 02/22/2013   Procedure: LEFT HEART CATHETERIZATION WITH CORONARY ANGIOGRAM;  Surgeon: [name], MD;  Location: [facility] CATH  LAB;  Service: Cardiovascular;  Laterality: N/A;   PROCTOSCOPY  07/23/2012   Procedure: PROCTOSCOPY;  Surgeon: [name], MD;  Location: [facility];  Service: General;  Laterality: N/A;  Rigid Proctoscopy   ROTATOR CUFF REPAIR     TONSILLECTOMY     TOTAL HIP ARTHROPLASTY Right 05/27/2018   Procedure: RIGHT TOTAL HIP ARTHROPLASTY ANTERIOR APPROACH;  Surgeon: [name], MD;  Location: [facility];  Service: Orthopedics;  Laterality: Right;  [id number]   UMBILICAL HERNIA REPAIR  07/23/2012   Procedure: HERNIA REPAIR UMBILICAL ADULT;  Surgeon: [name], MD;  Location: [facility];  Service: General;  Laterality: N/A;  Primary Umbilical Hernia Repair   Family History  Problem Relation Age of Onset   Stroke Mother    Heart disease Mother        Valvular heart disease, Rheumatic fever   Lung cancer Maternal Uncle    Diverticulitis Son    Diverticulitis Daughter    Colon cancer Neg Hx    Colon polyps Neg Hx    Esophageal cancer Neg Hx    Rectal cancer Neg Hx    Stomach cancer Neg Hx    Social History   Socioeconomic History   Marital status: Married    Spouse name: Alice   Number of children: 4   Years of education: 55   Highest education level: Some college, no degree  Occupational History   Occupation: Retired     Comment: Social research officer, government  Tobacco Use   Smoking status: Former    Types: Cigars    Quit date: 08/12/1999    Years since quitting: 22.0   Smokeless tobacco: Never   Tobacco comments:    smoked cigars on and off for approx 4-5 yrs  Vaping Use   Vaping Use: Never used  Substance and Sexual Activity   Alcohol use: No   Drug use: No   Sexual activity: Yes  Other Topics Concern   Not on file  Social History Narrative   Lives at home with wife      VA Benefits      [facility] (09/16/16):   Originally from [facility]. Previously served & lived in [place], [place], & [place] 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 He also worked in [employer]. No pets currently. Remote bird exposure. No mold or hot tub exposure. Enjoys playing banjo as well as hunting. He also raises cattle. Has carpet in his bedroom. No feather bedding. No indoor plants.    Social Determinants of Health   Financial Resource Strain: Low Risk    Difficulty of Paying Living Expenses: Not hard at all  Food Insecurity: No Food Insecurity   Worried About Charity fundraiser in the Last Year: Never true   Page in the Last Year: Never true  Transportation Needs: No Transportation Needs   Lack of Transportation (Medical): No   Lack of Transportation (Non-Medical): No  Physical Activity: Sufficiently Active   Days of Exercise per Week: 5 days   Minutes of Exercise per Session: 30 min  Stress: No Stress Concern Present   Feeling of Stress : Not at all  Social Connections: Socially Integrated   Frequency of Communication with Friends and Family: More than three times a week   Frequency of Social Gatherings with Friends and Family: More than three times a week   Attends Religious Services: More than 4  times per year   Active Member of Clubs or Organizations: Yes   Attends Music therapist: More than 4 times per year   Marital Status: Married     Tobacco Counseling Counseling given: Not Answered Tobacco comments: smoked cigars on and off for approx 4-5 yrs   Clinical Intake:  Pre-visit preparation completed: Yes  Pain : 0-10 Pain Score: 3  Pain Type: Chronic pain Pain Location: Back Pain Orientation: Mid, Lower Pain Descriptors / Indicators: Aching Pain Onset: More than a month ago Pain Frequency: Intermittent     BMI - recorded: 36.15 Nutritional Status: BMI > 30  Obese Diabetes: Yes CBG done?: No Did pt. bring in CBG monitor from home?: No  Moore often do you need to have someone help you when you read instructions, pamphlets, or other written materials from your doctor or pharmacy?: 1 - Never  Diabetic? Nutrition Risk Assessment:  Has the patient had any N/V/D within the last 2 months?  No  Does the patient have any non-healing wounds?  No  Has the patient had any unintentional weight loss or weight gain?  No   Diabetes:  Is the patient diabetic?  Yes  If diabetic, was a CBG obtained today?  No  Did the patient bring in their glucometer from home?  No  Moore often do you monitor your CBG's? 1-2 times per week.   Financial Strains and Diabetes Management:  Are you having any financial strains with the device, your supplies or your medication? No .  Does the patient want to be seen by Chronic Care Management for management of their diabetes?  No  Would the patient like to be referred to a Nutritionist or for Diabetic Management?  No   Diabetic Exams:  Diabetic Eye Exam: Completed 2022 at [facility].   Diabetic Foot Exam: Completed 2022 at [facility]. Pt has been advised about the importance in completing this exam. Pt is scheduled for diabetic foot exam on later this year.    Interpreter Needed?: No  Information entered by :: Idali Lafever, LPN   Activities of Daily Living In your present state of health, do you have any difficulty performing the following activities: 09/05/2021  Hearing? Y  Comment wears hearing  aids  Vision? N  Difficulty concentrating or making decisions? Y  Comment since stroke last year - has improved  Walking or climbing stairs? N  Dressing or bathing? N  Doing errands, shopping? N  Preparing Food and eating ? N  Using the Toilet? N  In the past six months, have you accidently leaked urine? N  Do you have problems with loss of bowel control? N  Managing your Medications? N  Managing your Finances? N  Housekeeping or managing your Housekeeping? N  Some recent data might be hidden    Patient Care Team: [name], MD as PCP - General (Family Medicine) [name], MD as PCP - Cardiology (Cardiology) Gatha Mayer, MD as Consulting Physician (Gastroenterology) [name], MD as Consulting Physician (Cardiology) Anda Kraft, MD as Consulting Physician (Endocrinology) Tanda Rockers, MD as Consulting Physician (Pulmonary Disease) Netta Cedars, MD as Consulting Physician (Orthopedic Surgery) Suella Broad, MD as Consulting Physician (Physical Medicine and Rehabilitation) Wanda Plump, NP as Nurse Practitioner (Urology) Steffanie Rainwater, DPM as Consulting Physician (Podiatry)  Indicate any recent Medical Services you may have received from other than [facility] providers in the past year (date may be approximate).     Assessment:   This is a  routine wellness examination for [place].  Hearing/Vision screen Hearing Screening - Comments:: Wears hearing aids - from [place] [facility] Vision Screening - Comments:: Wears rx glasses - up to date with routine eye exams with [facility]  Dietary issues and exercise activities discussed: Current Exercise Habits: Home exercise routine, Type of exercise: walking;strength training/weights;stretching (total gym machine at home), Time (Minutes): 30, Frequency (Times/Week): 5, Weekly Exercise (Minutes/Week): 150, Intensity: Moderate, Exercise limited by: orthopedic condition(s);respiratory conditions(s)   Goals Addressed              This Visit's Progress    Patient Stated   On track    09/04/2020 AWV Goal: Exercise for General Health  Patient will verbalize understanding of the benefits of increased physical activity: Exercising regularly is important. It will improve your overall fitness, flexibility, and endurance. Regular exercise also will improve your overall health. It can help you control your weight, reduce stress, and improve your bone density. Over the next year, patient will increase physical activity as tolerated with a goal of at least 150 minutes of moderate physical activity per week.  You can tell that you are exercising at a moderate intensity if your heart starts beating faster and you start breathing faster but can still hold a conversation. Moderate-intensity exercise ideas include: Walking 1 mile (1.6 km) in about 15 minutes Biking Hiking Golfing Dancing Water aerobics Patient will verbalize understanding of everyday activities that increase physical activity by providing examples like the following: Yard work, such as: Sales promotion account executive Gardening Washing windows or floors Patient will be able to explain general safety guidelines for exercising:  Before you start a new exercise program, talk with your health care provider. Do not exercise so much that you hurt yourself, feel dizzy, or get very short of breath. Wear comfortable clothes and wear shoes with good support. Drink plenty of water while you exercise to prevent dehydration or heat stroke. Work out until your breathing and your heartbeat get faster.        Depression Screen PHQ 2/9 Scores 09/05/2021 05/21/2021 05/21/2021 04/18/2021 02/16/2021 02/16/2021 09/19/2020  PHQ - 2 Score 0 1 0 0 1 0 0  PHQ- 9 Score - 3 - 2 4 - -    Fall Risk Fall Risk  09/05/2021 05/21/2021 05/21/2021 04/18/2021 02/16/2021  Falls in the past year? 0 '1 1 1 '$ 0  Number falls  in past yr: '1 1 1 1 '$ -  Injury with Fall? 1 0 1 1 -  Comment - - - rib, shoulder -  Risk for fall due to : Orthopedic patient;History of fall(s);Medication side effect Impaired balance/gait;Impaired mobility Impaired balance/gait;Impaired mobility Impaired balance/gait;Impaired mobility -  Follow up Falls prevention discussed;Education provided Falls evaluation completed Falls evaluation completed - -    FALL RISK PREVENTION PERTAINING TO THE HOME:  Any stairs in or around the home? Yes  If so, are there any without handrails? No  Home free of loose throw rugs in walkways, pet beds, electrical cords, etc? Yes  Adequate lighting in your home to reduce risk of falls? Yes   ASSISTIVE DEVICES UTILIZED TO PREVENT FALLS:  Life alert? No  Use of a cane, walker or w/c? Yes  Grab bars in the bathroom? Yes  Shower chair or bench in shower? Yes  Elevated toilet seat or a handicapped toilet? Yes   TIMED UP AND GO:  Was the test performed? No . Telephonic  visit  Cognitive Function: MMSE - Mini Mental State Exam 08/27/2018 05/30/2017 10/13/2014  Orientation to time '5 5 5  '$ Orientation to Place '5 5 5  '$ Registration '3 3 3  '$ Attention/ Calculation '5 5 5  '$ Recall '2 3 3  '$ Language- name 2 objects '2 2 2  '$ Language- repeat '1 1 1  '$ Language- follow 3 step command '3 3 3  '$ Language- read & follow direction '1 1 1  '$ Write a sentence '1 1 1  '$ Copy design '1 1 1  '$ Total score '29 30 30     '$ 6CIT Screen 09/05/2021 09/04/2020 08/30/2019  What Year? 0 points 0 points 0 points  What month? 0 points 0 points 0 points  What time? 0 points 0 points 0 points  Count back from 20 0 points 0 points 0 points  Months in reverse 2 points 0 points 0 points  Repeat phrase 2 points 0 points 2 points  Total Score 4 0 2    Immunizations Immunization History  Administered Date(s) Administered   Fluad Quad(high Dose 65+) 04/16/2019, 04/13/2021   Influenza Split 03/31/2012, 03/19/2013   Influenza, High Dose Seasonal PF 04/19/2009,  03/27/2012, 03/25/2014, 03/27/2015, 03/27/2016, 03/01/2017, 04/11/2017, 04/08/2018, 05/26/2018   Influenza,inj,Quad PF,6+ Mos 03/19/2013, 03/27/2015   Influenza,inj,quad, With Preservative 04/21/2018   Influenza-Unspecified 03/31/2006, 05/22/2007, 03/31/2008, 05/01/2008, 06/20/2010, 01/30/2011, 04/01/2011, 02/28/2014, 03/01/2016, 06/02/2020   Moderna Covid-19 Vaccine Bivalent Booster 18yr & up 05/28/2021   Moderna Sars-Covid-2 Vaccination 07/28/2019, 08/25/2019, 05/10/2020, 10/11/2020   Pneumococcal Conjugate-13 07/08/2013   Pneumococcal Polysaccharide-23 03/31/2005, 07/01/2013, 02/05/2019   Pneumococcal-Unspecified 07/01/2004   Tdap 07/01/2009, 03/02/2011, 04/01/2011   Zoster Recombinat (Shingrix) 06/21/2021    TDAP status: Up to date  Flu Vaccine status: Up to date  Pneumococcal vaccine status: Up to date  Covid-19 vaccine status: Completed vaccines  Qualifies for Shingles Vaccine? Yes   Zostavax completed Yes   Shingrix Completed?: No.    Education has been provided regarding the importance of this vaccine. Patient has been advised to call insurance company to determine out of pocket expense if they have not yet received this vaccine. Advised may also receive vaccine at local pharmacy or Health Dept. Verbalized acceptance and understanding.  Screening Tests Health Maintenance  Topic Date Due   OPHTHALMOLOGY EXAM  08/01/2017   TETANUS/TDAP  03/31/2021   Zoster Vaccines- Shingrix (2 of 2) 08/16/2021   FOOT EXAM  08/28/2021   URINE MICROALBUMIN  08/28/2021   HEMOGLOBIN A1C  11/18/2021   Pneumonia Vaccine 81 Years old  Completed   INFLUENZA VACCINE  Completed   COVID-19 Vaccine  Completed   HPV VACCINES  Aged Out    Health Maintenance  Health Maintenance Due  Topic Date Due   OPHTHALMOLOGY EXAM  08/01/2017   TETANUS/TDAP  03/31/2021   Zoster Vaccines- Shingrix (2 of 2) 08/16/2021   FOOT EXAM  08/28/2021   URINE MICROALBUMIN  08/28/2021    Colorectal cancer  screening: No longer required.   Lung Cancer Screening: (Low Dose CT Chest recommended if Age 82-80years, 30 pack-year currently smoking OR have quit w/in 15years.) does not qualify.   Additional Screening:  Hepatitis C Screening: does not qualify  Vision Screening: Recommended annual ophthalmology exams for early detection of glaucoma and other disorders of the eye. Is the patient up to date with their annual eye exam?  Yes  Who is the provider or what is the name of the office in which the patient attends annual [facility] exams[facility] provider, would they like to be referred to a provider to establish care? No .   Dental Screening: Recommended annual dental exams for proper oral hygiene  Community Resource Referral / Chronic Care Management: CRR required this visit?  No   CCM required this visit?  No      Plan:     I have personally reviewed and noted the following in the patients chart:   Medical and social history Use of alcohol, tobacco or illicit drugs  Current medications and supplements including opioid prescriptions. Patient is currently taking opioid prescriptions. Information provided to patient regarding non-opioid alternatives. Patient advised to discuss non-opioid treatment plan with their provider. Functional ability and status Nutritional status Physical activity Advanced directives List of other physicians Hospitalizations, surgeries, and ER visits in previous 12 months Vitals Screenings to include cognitive, depression, and falls Referrals and appointments  In addition, I have reviewed and discussed with patient certain preventive protocols, quality metrics, and best practice recommendations. A written personalized care plan for preventive services as well as general preventive health recommendations were provided to patient.     Sandrea Hammond, LPN   1/0/3013   Nurse Notes: None

## 2021-09-05 NOTE — Patient Instructions (Signed)
[name] , Thank you for taking time to come for your Medicare Wellness Visit. I appreciate your ongoing commitment to your health goals. Please review the following plan we discussed and let me know if I can assist you in the future.   Screening recommendations/referrals: Colonoscopy: no longer required Recommended yearly ophthalmology/optometry visit for glaucoma screening and checkup Recommended yearly dental visit for hygiene and checkup  Vaccinations: Influenza vaccine: 04/13/21 - repeat annually Pneumococcal vaccine: 07/08/2013 & 02/05/2019 - ask about Prevnar-20 Tdap vaccine: 04/01/2011 - repeat every 10 years *past due Shingles vaccine:  Done 06/21/2021 - get second dose 2-6 months later  Covid-19:  Done 07/28/2019, 08/25/19, 05/10/20, 10/11/20, 05/28/2021  Advanced directives: Advance directive discussed with you today. Even though you declined this today, please call our office should you change your mind, and we can give you the proper paperwork for you to fill out.   Conditions/risks identified: Aim for 30 minutes of exercise or brisk walking, 6-8 glasses of water, and 5 servings of fruits and vegetables each day.   Next appointment: Follow up in one year for your annual wellness visit.   Preventive Care 82 Years and Older, Male  Preventive care refers to lifestyle choices and visits with your health care provider that can promote health and wellness. What does preventive care include? A yearly physical exam. This is also called an annual well check. Dental exams once or twice a year. Routine eye exams. Ask your health care provider how often you should have your eyes checked. Personal lifestyle choices, including: Daily care of your teeth and gums. Regular physical activity. Eating a healthy diet. Avoiding tobacco and drug use. Limiting alcohol use. Practicing safe sex. Taking low doses of aspirin every day. Taking vitamin and mineral supplements as recommended by your  health care provider. What happens during an annual well check? The services and screenings done by your health care provider during your annual well check will depend on your age, overall health, lifestyle risk factors, and family history of disease. Counseling  Your health care provider may ask you questions about your: Alcohol use. Tobacco use. Drug use. Emotional well-being. Home and relationship well-being. Sexual activity. Eating habits. History of falls. Memory and ability to understand (cognition). Work and work Statistician. Screening  You may have the following tests or measurements: Height, weight, and BMI. Blood pressure. Lipid and cholesterol levels. These may be checked every 5 years, or more frequently if you are over 30 years old. Skin check. Lung cancer screening. You may have this screening every year starting at age 82 if you have a 30-pack-year history of smoking and currently smoke or have quit within the past 15 years. Fecal occult blood test (FOBT) of the stool. You may have this test every year starting at age 82. Flexible sigmoidoscopy or colonoscopy. You may have a sigmoidoscopy every 5 years or a colonoscopy every 10 years starting at age 82. Prostate cancer screening. Recommendations will vary depending on your family history and other risks. Hepatitis C blood test. Hepatitis B blood test. Sexually transmitted disease (STD) testing. Diabetes screening. This is done by checking your blood sugar (glucose) after you have not eaten for a while (fasting). You may have this done every 1-3 years. Abdominal aortic aneurysm (AAA) screening. You may need this if you are a current or former smoker. Osteoporosis. You may be screened starting at age 82 if you are at high risk. Talk with your health care provider about your test results, treatment  options, and if necessary, the need for more tests. Vaccines  Your health care provider may recommend certain vaccines, such  as: Influenza vaccine. This is recommended every year. Tetanus, diphtheria, and acellular pertussis (Tdap, Td) vaccine. You may need a Td booster every 10 years. Zoster vaccine. You may need this after age 82. Pneumococcal 13-valent conjugate (PCV13) vaccine. One dose is recommended after age 82. Pneumococcal polysaccharide (PPSV23) vaccine. One dose is recommended after age 82. Talk to your health care provider about which screenings and vaccines you need and how often you need them. This information is not intended to replace advice given to you by your health care provider. Make sure you discuss any questions you have with your health care provider. Document Released: 07/14/2015 Document Revised: 03/06/2016 Document Reviewed: 04/18/2015 Elsevier Interactive Patient Education  2017 [place] [employer] Prevention in the Home Falls can cause injuries. They can happen to people of all ages. There are many things you can do to make your home safe and to help prevent falls. What can I do on the outside of my home? Regularly fix the edges of walkways and driveways and fix any cracks. Remove anything that might make you trip as you walk through a door, such as a raised step or threshold. Trim any bushes or trees on the path to your home. Use bright outdoor lighting. Clear any walking paths of anything that might make someone trip, such as rocks or tools. Regularly check to see if handrails are loose or broken. Make sure that both sides of any steps have handrails. Any raised decks and porches should have guardrails on the edges. Have any leaves, snow, or ice cleared regularly. Use sand or salt on walking paths during winter. Clean up any spills in your garage right away. This includes oil or grease spills. What can I do in the bathroom? Use night lights. Install grab bars by the toilet and in the tub and shower. Do not use towel bars as grab bars. Use non-skid mats or decals in the tub or  shower. If you need to sit down in the shower, use a plastic, non-slip stool. Keep the floor dry. Clean up any water that spills on the floor as soon as it happens. Remove soap buildup in the tub or shower regularly. Attach bath mats securely with double-sided non-slip rug tape. Do not have throw rugs and other things on the floor that can make you trip. What can I do in the bedroom? Use night lights. Make sure that you have a light by your bed that is easy to reach. Do not use any sheets or blankets that are too big for your bed. They should not hang down onto the floor. Have a firm chair that has side arms. You can use this for support while you get dressed. Do not have throw rugs and other things on the floor that can make you trip. What can I do in the kitchen? Clean up any spills right away. Avoid walking on wet floors. Keep items that you use a lot in easy-to-reach places. If you need to reach something above you, use a strong step stool that has a grab bar. Keep electrical cords out of the way. Do not use floor polish or wax that makes floors slippery. If you must use wax, use non-skid floor wax. Do not have throw rugs and other things on the floor that can make you trip. What can I do with my stairs? Do not  leave any items on the stairs. Make sure that there are handrails on both sides of the stairs and use them. Fix handrails that are broken or loose. Make sure that handrails are as long as the stairways. Check any carpeting to make sure that it is firmly attached to the stairs. Fix any carpet that is loose or worn. Avoid having throw rugs at the top or bottom of the stairs. If you do have throw rugs, attach them to the floor with carpet tape. Make sure that you have a light switch at the top of the stairs and the bottom of the stairs. If you do not have them, ask someone to add them for you. What else can I do to help prevent falls? Wear shoes that: Do not have high heels. Have  rubber bottoms. Are comfortable and fit you well. Are closed at the toe. Do not wear sandals. If you use a stepladder: Make sure that it is fully opened. Do not climb a closed stepladder. Make sure that both sides of the stepladder are locked into place. Ask someone to hold it for you, if possible. Clearly mark and make sure that you can see: Any grab bars or handrails. First and last steps. Where the edge of each step is. Use tools that help you move around (mobility aids) if they are needed. These include: Canes. Walkers. Scooters. Crutches. Turn on the lights when you go into a dark area. Replace any light bulbs as soon as they burn out. Set up your furniture so you have a clear path. Avoid moving your furniture around. If any of your floors are uneven, fix them. If there are any pets around you, be aware of where they are. Review your medicines with your doctor. Some medicines can make you feel dizzy. This can increase your chance of falling. Ask your doctor what other things that you can do to help prevent falls. This information is not intended to replace advice given to you by your health care provider. Make sure you discuss any questions you have with your health care provider. Document Released: 04/13/2009 Document Revised: 11/23/2015 Document Reviewed: 07/22/2014 Elsevier Interactive Patient Education  2017 [employer].

## 2021-09-18 DIAGNOSIS — M47816 Spondylosis without myelopathy or radiculopathy, lumbar region: Secondary | ICD-10-CM | POA: Diagnosis not present

## 2021-09-18 DIAGNOSIS — M533 Sacrococcygeal disorders, not elsewhere classified: Secondary | ICD-10-CM | POA: Diagnosis not present

## 2021-09-18 DIAGNOSIS — G8929 Other chronic pain: Secondary | ICD-10-CM | POA: Diagnosis not present

## 2021-09-18 DIAGNOSIS — M48061 Spinal stenosis, lumbar region without neurogenic claudication: Secondary | ICD-10-CM | POA: Diagnosis not present

## 2021-09-18 DIAGNOSIS — Z79899 Other long term (current) drug therapy: Secondary | ICD-10-CM | POA: Diagnosis not present

## 2021-09-18 DIAGNOSIS — Z5181 Encounter for therapeutic drug level monitoring: Secondary | ICD-10-CM | POA: Diagnosis not present

## 2021-10-23 DIAGNOSIS — E785 Hyperlipidemia, unspecified: Secondary | ICD-10-CM | POA: Diagnosis not present

## 2021-10-23 DIAGNOSIS — I10 Essential (primary) hypertension: Secondary | ICD-10-CM | POA: Diagnosis not present

## 2021-10-29 ENCOUNTER — Other Ambulatory Visit: Payer: Self-pay | Admitting: Family Medicine

## 2021-10-29 DIAGNOSIS — K219 Gastro-esophageal reflux disease without esophagitis: Secondary | ICD-10-CM

## 2021-10-29 NOTE — Telephone Encounter (Signed)
Pt transferred care

## 2021-10-29 NOTE — Telephone Encounter (Signed)
30 days given ntbs  

## 2021-11-05 ENCOUNTER — Other Ambulatory Visit: Payer: Self-pay | Admitting: *Deleted

## 2021-11-05 MED ORDER — ROSUVASTATIN CALCIUM 10 MG PO TABS: 10.0000 mg | ORAL_TABLET | Freq: Every day | ORAL | 3 refills | Status: AC

## 2021-11-15 ENCOUNTER — Other Ambulatory Visit: Payer: Self-pay | Admitting: Family Medicine

## 2021-11-15 NOTE — Telephone Encounter (Signed)
[facility] calling, saying they received notice that Rx could not be escribed that fax would follow. Pt on 10/29/21 when called to make an appt informed switchboard that he had transferred care, PCP was removed. RF should not have been approved. Informed [place] of this and printed Rx will be destroyed.

## 2021-11-21 ENCOUNTER — Other Ambulatory Visit: Payer: Self-pay | Admitting: Family Medicine

## 2021-11-21 DIAGNOSIS — K219 Gastro-esophageal reflux disease without esophagitis: Secondary | ICD-10-CM

## 2021-11-27 ENCOUNTER — Other Ambulatory Visit: Payer: Self-pay | Admitting: Family Medicine

## 2022-01-07 ENCOUNTER — Ambulatory Visit (INDEPENDENT_AMBULATORY_CARE_PROVIDER_SITE_OTHER): Payer: Medicare HMO | Admitting: Adult Health

## 2022-01-07 ENCOUNTER — Encounter: Payer: Self-pay | Admitting: Adult Health

## 2022-01-07 VITALS — BP 113/69 | HR 65 | Ht 72.0 in | Wt 262.1 lb

## 2022-01-07 DIAGNOSIS — I6381 Other cerebral infarction due to occlusion or stenosis of small artery: Secondary | ICD-10-CM

## 2022-01-07 NOTE — Progress Notes (Signed)
[facility] [address] [place]. [place] [address] [phone]       OFFICE FOLLOW-UP NOTE  [name] Date of Birth:  12-20-1940 Medical Record Number:  [id number]   Primary neurologist: Dr. Leonie Moore PCP: Cristian Sill, NP    Chief Complaint  Patient presents with   Follow-up    Pt reports having a few problems finding his words and trying to talk but has improved. Has a question about Plavix and he is not sure if he is suppose to be taking it. Room 3 alone      HPI:   Update 01/07/2022 Cristian Moore: Patient returns for 93-monthstroke follow-up unaccompanied.  Has been doing well.  Denies new stroke/TIA symptoms.  Reports  occasional word finding difficulty and mild short term memory issues with some improvement since prior visit.  Compliant on aspirin and Crestor, denies side effects.  Blood pressure today 113/69.  Reports compliance on CPAP nightly. He continues to have bilateral knee pain - per patient, ortho wanted to wait at least 1 year post stroke prior to undergoing replacement surgery.  No further concerns at this time.     History provided for reference purposes only Update 07/09/2021 Cristian Moore: Returns for 4 month stroke f/u. Overall stable. No new stroke/TIA symptoms. Reports occasional word finding difficulty/delayed word recall but this was present prior to stroke - per wife, possibly worsened post stroke. Completed HH SLP with improvement noted. Completed PT for imbalance with improvement. Continued use of cane - can does b/l knee pain looking at getting replacements. Questions duration of procedure post stroke.  Compliant on aspirin and simvastatin without side effects.  Blood pressure today 132/74 - occasionally monitors at home which has been stable.  Glucose levels monitored at home typically stable with A1c 6.0 back in November. Nightly use of CPAP for OSA management. No further concerns at this time.  Initial visit 02/19/2021 Dr. SLeonie Moore [name]  a 81year old Caucasian male seen today for initial office follow-up visit following hospital admission for possible stroke and Augus 2022.  Is accompanied by his wife.  History is obtained from them, review of electronic medical records and I personally reviewed pertinent imaging films in PACS.  He has past medical history of hypertension, hyperlipidemia, coronary artery disease, diabetes, obesity, sleep apnea, gastroesophageal reflux disease and chronic cough.  He presented on 02/11/2021 with sudden onset of right sided numbness, facial droop and dysarthria.  NIH stroke scale was 2 on admission.  CT scan of the head was unremarkable with aspect score of 10.  CT angiogram of the brain and neck but did not reveal significant extracranial intracranial stenosis.  Patient was given IV tPA after discussion of risk-benefit.  His symptoms resolved shortly after tPA administration.  He had close neurological observation with strict control of blood pressure as per post tPA protocol.  2D echo showed ejection fraction 50 to 514%grade 1 diastolic dysfunction.  LDL cholesterol was 54 mg percent and hemoglobin A1c was 6.2%.  Telemetry monitoring did not reveal cardiac arrhythmias.  Patient did well and was discharged home and advised to take dual antiplatelet therapy of aspirin and Plavix for 3 weeks followed by aspirin alone.  He is tolerating this well but does complain of bruising.  He fell out of bed 1 night and has a left periorbital bruise.  He states his blood pressures well controlled and today it is 117/65.  He has new complaint of short-term memory difficulties which he admits  to existed even before his stroke.  He can remember things later but not in the spur of the moment.  He finds this frustrating.  He does take Krill oil every day but does not participate in mentally challenging activities on a regular basis.    ROS:   14 system review of systems is positive for those listed in HPI and all other systems  negative  PMH:  Past Medical History:  Diagnosis Date   Allergy    Arthritis    Asthma    Benign prostatic hypertrophy    Cataract    mild   Chest pain    a. 01/2013 Cath: LM nl, LAD 25p, D1 25p, LCX nl, RCA 25p, PDA nl, RPL nl, EF 55%;  b. 03/2015 MV: EF 59%, no ischemia; c. 03/2016 MV: basal inf/mid inf defect, likely artifact, EF 48%, low risk.   Chronic Dyspnea on exertion    Diabetes mellitus without complication ([place]) 1610   Diverticulitis    Diverticulosis    Exogenous obesity    GERD (gastroesophageal reflux disease)    moe like heart burn   H/O blepharoplasty 07/27/2013   H/O hiatal hernia    Hernia    Umbulical    History of adenomatous polyp of colon 1991   Hyperlipidemia    Hypothyroidism 08/23/2019   Laceration 01/11/2009   Deep laceration left index finger dorsally   Musculoskeletal pain    in the right shoulder - S/P ROTATOR CUFF REPAIR-BUT STILL HAS PAIN   Obstructive sleep apnea     CPAP    AUTO SET 6 TO 20 - USUALLY SETTLES OUT AT 10   Rectal bleeding    PT ATTRIBUTES TO HEMORRHOIDS   Stroke ([place])    SUPRAVENTRICULAR TACHYCARDIA 08/01/2009   Qualifier: Diagnosis of  By: Lovette Cliche, CNA, [place]     Thyroiditis    Ulcer 2008    Social History:  Social History   Socioeconomic History   Marital status: Married    Spouse name: Cristian Moore   Number of children: 4   Years of education: 13   Highest education level: Some college, no degree  Occupational History   Occupation: Retired    Comment: Social research officer, government  Tobacco Use   Smoking status: Former    Types: Cigars    Quit date: 08/12/1999    Years since quitting: 22.4   Smokeless tobacco: Never   Tobacco comments:    smoked cigars on and off for approx 4-5 yrs  Vaping Use   Vaping Use: Never used  Substance and Sexual Activity   Alcohol use: No   Drug use: No   Sexual activity: Yes  Other Topics Concern   Not on file  Social History Narrative   Lives at home with wife      VA Benefits      [place]  Pulmonary (09/16/16):   Originally from [facility]. Previously served & lived in [place], [place], & [place]. 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 He also worked in [employer]. No pets currently. Remote bird exposure. No mold or hot tub exposure. Enjoys playing banjo as well as hunting. He also raises cattle. Has carpet in his bedroom. No feather bedding. No indoor plants.    Social Determinants of Health   Financial Resource Strain: Low Risk  (09/05/2021)   Overall Financial Resource Strain (CARDIA)    Difficulty of Paying Living Expenses: Not  hard at all  Food Insecurity: No Food Insecurity (09/05/2021)   Hunger Vital Sign    Worried About Running Out of Food in the Last Year: Never true    Ran Out of Food in the Last Year: Never true  Transportation Needs: No Transportation Needs (09/05/2021)   PRAPARE - Hydrologist (Medical): No    Lack of Transportation (Non-Medical): No  Physical Activity: Sufficiently Active (09/05/2021)   Exercise Vital Sign    Days of Exercise per Week: 5 days    Minutes of Exercise per Session: 30 min  Stress: No Stress Concern Present (09/05/2021)   [place]    Feeling of Stress : Not at all  Social Connections: [place] (09/05/2021)   Social Connection and Isolation Panel [NHANES]    Frequency of Communication with Friends and Family: More than three times a week    Frequency of Social Gatherings with Friends and Family: More than three times a week    Attends Religious Services: More than 4 times per year    Active Member of [employer] or Organizations: Yes    Attends Music therapist: More than 4 times per year    Marital Status: Married  Human resources officer Violence: Not At Risk (09/05/2021)   Humiliation, Afraid, Rape, and Kick questionnaire    Fear of Current or Ex-Partner: No    Emotionally  Abused: No    Physically Abused: No    Sexually Abused: No    Medications:   Current Outpatient Medications on File Prior to Visit  Medication Sig Dispense Refill   acetaminophen (TYLENOL) 500 MG tablet Take 1,000 mg by mouth every 6 (six) hours as needed for moderate pain.     albuterol (VENTOLIN HFA) 108 (90 Base) MCG/ACT inhaler Inhale 2 puffs into the lungs every 6 (six) hours as needed for wheezing or shortness of breath. 18 g 3   aspirin EC 81 MG tablet Take 81 mg by mouth daily. Swallow whole.     calcium carbonate (OS-CAL) 1250 (500 Ca) MG chewable tablet CHEW TWO TABLETS BY MOUTH DAILY     Cholecalciferol (VITAMIN D) 50 MCG (2000 UT) tablet Take 2,000 Units by mouth daily.     diclofenac Sodium (VOLTAREN) 1 % GEL Apply 2 g topically 4 (four) times daily as needed (shoulders/knees/back pain).     escitalopram (LEXAPRO) 10 MG tablet Take 1 tablet (10 mg total) by mouth at bedtime. 90 tablet 3   famotidine (PEPCID) 10 MG tablet Take 10 mg by mouth 2 (two) times daily as needed for heartburn or indigestion.     fluticasone (FLONASE) 50 MCG/ACT nasal spray USE 2 SPRAYS IN EACH       NOSTRIL DAILY 48 g 0   glucose blood (ONETOUCH VERIO) test strip USE TO CHECK BLOOD SUGAR TWICE DAILY AND AS NEEDED 200 each 3   HYDROcodone-acetaminophen (NORCO/VICODIN) 5-325 MG tablet Take 0.5 tablets by mouth every 6 (six) hours as needed for moderate pain.     hydroxypropyl methylcellulose / hypromellose (ISOPTO TEARS / GONIOVISC) 2.5 % ophthalmic solution Place 1 drop into both eyes 4 (four) times daily as needed for dry eyes.     Krill Oil 350 MG CAPS Take 350 mg by mouth daily.     metFORMIN (GLUCOPHAGE) 500 MG tablet Take 1 tablet (500 mg total) by mouth daily with breakfast. 90 tablet 2   metoprolol tartrate (LOPRESSOR) 50 MG tablet Take  0.5 tablets (25 mg total) by mouth 2 (two) times daily. 90 tablet 3   montelukast (SINGULAIR) 10 MG tablet Take 1 tablet (10 mg total) by mouth at bedtime. Needs  office visit for further refills 30 tablet 0   Multiple Vitamins-Minerals (CENTRUM SILVER PO) Take 1 tablet by mouth daily.     nitroGLYCERIN (NITROSTAT) 0.4 MG SL tablet DISSOLVE ONE TABLET UNDER THE TONGUE EVERY 5 MINUTES AS NEEDED FOR CHEST PAIN.  DO NOT EXCEED A TOTAL OF 3 DOSES IN 15 MINUTES (Patient taking differently: Place 0.4 mg under the tongue every 5 (five) minutes as needed for chest pain.) 25 tablet 11   PRESCRIPTION MEDICATION Inhale 1 application into the lungs at bedtime. "CPAP" MACHINE     rosuvastatin (CRESTOR) 10 MG tablet Take 1 tablet (10 mg total) by mouth daily. 90 tablet 3   sucralfate (CARAFATE) 1 g tablet Take 1 tablet (1 g total) by mouth daily. Needed for stomach coating.  Needs office visit for further refills 30 tablet 0   tamsulosin (FLOMAX) 0.4 MG CAPS capsule Take 2 capsules (0.8 mg total) by mouth daily after supper. 180 capsule 1   zolpidem (AMBIEN) 10 MG tablet TAKE ONE TABLET AT BEDTIME AS NEEDED FOR SLEEP 90 tablet 1   No current facility-administered medications on file prior to visit.    Allergies:   Allergies  Allergen Reactions   Penicillins Rash and Hives    Over 60 years ago Has patient had a PCN reaction causing immediate rash, facial/tongue/throat swelling, SOB or lightheadedness with hypotension: Unknown Has patient had a PCN reaction causing severe rash involving mucus membranes or skin necrosis: Unknown Has patient had a PCN reaction that required hospitalization: No Has patient had a PCN reaction occurring within the last 10 years: No If all of the above answers are "NO", then may proceed with Cephalosporin use.     Physical Exam Today's Vitals   01/07/22 1035  BP: 113/69  Pulse: 65  Weight: 262 lb 2 oz (118.9 kg)  Height: 6' (1.829 m)   Body mass index is 35.55 kg/m.    General: Obese very pleasant elderly Caucasian male, seated, in no evident distress Head: head normocephalic and atraumatic.  Neck: supple with no carotid or  supraclavicular bruits Cardiovascular: regular rate and rhythm, no murmurs Musculoskeletal: no deformity Skin:  no rash/petichiae .left periorbital bruise Vascular:  Normal pulses all extremities  Neurologic Exam Mental Status: Awake and fully alert. Unable to appreciate aphasia or dysarthria. Oriented to place and time. Recent memory mildly impaired and remote memory intact. Attention span, concentration and fund of knowledge appropriate. Mood and affect appropriate.  Cranial Nerves: Pupils equal, briskly reactive to light. Extraocular movements full without nystagmus. Visual fields full to confrontation. Hearing intact. Facial sensation intact. Face, tongue, palate moves normally and symmetrically.  Motor: Normal bulk and tone. Normal strength in all tested extremity muscles. Sensory.: intact to touch ,pinprick .position and vibratory sensation.  Coordination: Rapid alternating movements normal in all extremities. Finger-to-nose and heel-to-shin performed accurately bilaterally. Gait and Station: Arises from chair without difficulty. Stance is normal. Gait demonstrates broad-based gait with decreased step height bilaterally with use of cane.  Unable to heel, toe and tandem walk  Reflexes: 1+ and symmetric. Toes downgoing.      ASSESSMENT/PLAN: Cristian Moore is a 81 year old Caucasian male with sudden onset of facial droop and right sided numbness likely due to small left subcortical lacunar stroke not seen on CT scan x2 treated with  IV tPA with residual word finding/delayed word finding and mild memory loss (present prior to stroke, ?worsened post stroke).  Patient unable to tolerate MRI.  Vascular risk factors of diabetes, hypertension, obesity and hyperlipidemia.     Small left subcortical stroke not seen on imaging Continue aspirin and Crestor for secondary stroke prevention - request rx refills by PCP Advised no indication for use of plavix from stroke standpoint - this was removed  from med list Close PCP follow-up for aggressive stroke risk factor management including HTN with BP goal<130/90, HLD with LDL goal<70 and DM with A1c goal<7 Stroke labs 05/2021: LDL 78, A1c 6.0    Doing well from stroke standpoint without further recommendations and risk factors are managed by PCP. He may follow up PRN, as usual for our patients who are strictly being followed for stroke. If any new neurological issues should arise, request PCP place referral for evaluation by one of our neurologists. Thank you.     CC:  Cristian Sill, NP   I spent 31 minutes of face-to-face and non-face-to-face time with patient.  This included previsit chart review, lab review, study review, electronic health record documentation, patient education and discussion regarding prior stroke and reported residual deficits, secondary stroke prevention measures and aggressive stroke risk factor management, and all the questions to patient satisfaction  Frann Rider, AGNP-BC  [facility] [address] [place], [place] [address]  Phone [phone] Fax [phone] Note: This document was prepared with digital dictation and possible smart phrase technology. Any transcriptional errors that result from this process are unintentional.

## 2022-01-07 NOTE — Patient Instructions (Addendum)
Continue aspirin 81 mg daily  and Crestor  for secondary stroke prevention  Continue to follow up with PCP regarding cholesterol and blood pressure management  Maintain strict control of hypertension with blood pressure goal below 130/90, diabetes with hemoglobin A1c goal below 7.0 % and cholesterol with LDL cholesterol (bad cholesterol) goal below 70 mg/dL.   Signs of a Stroke? Follow the BEFAST method:  Balance Watch for a sudden loss of balance, trouble with coordination or vertigo Eyes Is there a sudden loss of vision in one or both eyes? Or double vision?  Face: Ask the person to smile. Does one side of the face droop or is it numb?  Arms: Ask the person to raise both arms. Does one arm drift downward? Is there weakness or numbness of a leg? Speech: Ask the person to repeat a simple phrase. Does the speech sound slurred/strange? Is the person confused ? Time: If you observe any of these signs, call 911.        Thank you for coming to see [place] at [facility]. I hope we have been able to provide you high quality care today.  You may receive a patient satisfaction survey over the next few weeks. We would appreciate your feedback and comments so that we may continue to improve ourselves and the health of our patients.

## 2022-01-10 ENCOUNTER — Other Ambulatory Visit: Payer: Self-pay | Admitting: Family Medicine

## 2022-03-29 ENCOUNTER — Other Ambulatory Visit: Payer: Self-pay | Admitting: Family Medicine

## 2022-04-15 ENCOUNTER — Other Ambulatory Visit: Payer: Self-pay | Admitting: Family Medicine

## 2022-05-16 ENCOUNTER — Telehealth: Payer: Self-pay | Admitting: Cardiology

## 2022-05-16 ENCOUNTER — Encounter (HOSPITAL_COMMUNITY): Payer: Self-pay

## 2022-05-16 ENCOUNTER — Other Ambulatory Visit: Payer: Self-pay

## 2022-05-16 ENCOUNTER — Emergency Department (HOSPITAL_COMMUNITY)
Admission: EM | Admit: 2022-05-16 | Discharge: 2022-05-17 | Disposition: A | Discharge status: Home / Self Care | Payer: Medicare HMO | Attending: Emergency Medicine | Admitting: Emergency Medicine

## 2022-05-16 ENCOUNTER — Emergency Department (HOSPITAL_COMMUNITY): Payer: Medicare HMO

## 2022-05-16 DIAGNOSIS — R072 Precordial pain: Secondary | ICD-10-CM | POA: Insufficient documentation

## 2022-05-16 DIAGNOSIS — E039 Hypothyroidism, unspecified: Secondary | ICD-10-CM | POA: Diagnosis not present

## 2022-05-16 DIAGNOSIS — R079 Chest pain, unspecified: Secondary | ICD-10-CM | POA: Diagnosis present

## 2022-05-16 DIAGNOSIS — Z7982 Long term (current) use of aspirin: Secondary | ICD-10-CM | POA: Diagnosis not present

## 2022-05-16 DIAGNOSIS — E119 Type 2 diabetes mellitus without complications: Secondary | ICD-10-CM | POA: Insufficient documentation

## 2022-05-16 DIAGNOSIS — Z7984 Long term (current) use of oral hypoglycemic drugs: Secondary | ICD-10-CM | POA: Insufficient documentation

## 2022-05-16 DIAGNOSIS — I2510 Atherosclerotic heart disease of native coronary artery without angina pectoris: Secondary | ICD-10-CM | POA: Insufficient documentation

## 2022-05-16 DIAGNOSIS — R0602 Shortness of breath: Secondary | ICD-10-CM | POA: Diagnosis not present

## 2022-05-16 DIAGNOSIS — J45909 Unspecified asthma, uncomplicated: Secondary | ICD-10-CM | POA: Insufficient documentation

## 2022-05-16 DIAGNOSIS — Z7951 Long term (current) use of inhaled steroids: Secondary | ICD-10-CM | POA: Diagnosis not present

## 2022-05-16 DIAGNOSIS — I10 Essential (primary) hypertension: Secondary | ICD-10-CM

## 2022-05-16 DIAGNOSIS — Z79899 Other long term (current) drug therapy: Secondary | ICD-10-CM | POA: Insufficient documentation

## 2022-05-16 LAB — BASIC METABOLIC PANEL
Anion gap: 11 (ref 5–15)
BUN: 15 mg/dL (ref 8–23)
CO2: 22 mmol/L (ref 22–32)
Calcium: 9.8 mg/dL (ref 8.9–10.3)
Chloride: 103 mmol/L (ref 98–111)
Creatinine, Ser: 1.05 mg/dL (ref 0.61–1.24)
GFR, Estimated: >60 mL/min (ref >60)
Glucose, Bld: 95 mg/dL (ref 70–99)
Potassium: 4 mmol/L (ref 3.5–5.1)
Sodium: 136 mmol/L (ref 135–145)

## 2022-05-16 LAB — TROPONIN I (HIGH SENSITIVITY)
Troponin I (High Sensitivity): 4 ng/L (ref <18)
Troponin I (High Sensitivity): 4 ng/L (ref <18)

## 2022-05-16 LAB — CBC
HCT: 42.9 % (ref 39.0–52.0)
Hemoglobin: 14.6 g/dL (ref 13.0–17.0)
MCH: 33.1 pg (ref 26.0–34.0)
MCHC: 34 g/dL (ref 30.0–36.0)
MCV: 97.3 fL (ref 80.0–100.0)
Platelets: 330 10*3/uL (ref 150–400)
RBC: 4.41 MIL/uL (ref 4.22–5.81)
RDW: 13.3 % (ref 11.5–15.5)
WBC: 12.3 10*3/uL — ABNORMAL HIGH (ref 4.0–10.5)
nRBC: 0 % (ref 0.0–0.2)

## 2022-05-16 LAB — BRAIN NATRIURETIC PEPTIDE: B Natriuretic Peptide: 4.7 pg/mL (ref 0.0–100.0)

## 2022-05-16 MED ORDER — ASPIRIN 81 MG PO CHEW
324.0000 mg | CHEWABLE_TABLET | Freq: Once | ORAL | Status: AC
  Administered 2022-05-17: 324 mg via ORAL
  Filled 2022-05-16: qty 4

## 2022-05-16 MED ORDER — NITROGLYCERIN 0.4 MG SL SUBL: SUBLINGUAL_TABLET | SUBLINGUAL | 11 refills | Status: AC

## 2022-05-16 MED ORDER — NITROGLYCERIN 0.4 MG SL SUBL
0.4000 mg | SUBLINGUAL_TABLET | SUBLINGUAL | Status: DC | PRN
  Administered 2022-05-17: 0.4 mg via SUBLINGUAL
  Filled 2022-05-16: qty 1
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
## 2022-05-16 NOTE — ED Triage Notes (Signed)
Pt reports sharp central chest pain/pressure associated with SOB and nausea. He reports he had an episode of chest pain yesterday as well.

## 2022-05-16 NOTE — ED Provider Triage Note (Signed)
Emergency Medicine Provider Triage Evaluation Note  Cristian Moore , a 81 y.o. male  was evaluated in triage.  Pt complains of chest tightness and shortness of breath that started yesterday.  Patient does have a history of emphysema and stroke.  Patient states that his shortness of breath is worse with any exertion.  Denies any leg swelling or leg pain.  No fever, chills, cough.  Review of Systems  Positive:  Negative: See above  Physical Exam  BP (!) 138/101 (BP Location: Right Arm)   Pulse (!) 58   Temp 98.1 F (36.7 C) (Oral)   Resp 18   Ht '6\' 1"'$  (1.854 m)   Wt 111.1 kg   SpO2 100%   BMI 32.32 kg/m  Gen:   Awake, no distress   Resp:  Normal effort  MSK:   Moves extremities without difficulty  Other:    Medical Decision Making  Medically screening exam initiated at 7:06 PM.  Appropriate orders placed.  Cristian Moore was informed that the remainder of the evaluation will be completed by another provider, this initial triage assessment does not replace that evaluation, and the importance of remaining in the ED until their evaluation is complete.  Patient does appear to be slightly tachypneic but oxygenating well on room air.   Cristian Moore, [place] 05/16/22 1908

## 2022-05-16 NOTE — Telephone Encounter (Signed)
Pt c/o of Chest Pain: STAT if CP now or developed within 24 hours  1. Are you having CP right now?  Patient's wife is not currently with the patient  2. Are you experiencing any other symptoms (ex. SOB, nausea, vomiting, sweating)?  Patient's wife states the patient has been SOB   3. How long have you been experiencing CP?  Started yesterday  4. Is your CP continuous or coming and going?  Coming and going  5. Have you taken Nitroglycerin?  Yes  ?

## 2022-05-17 NOTE — ED Provider Notes (Signed)
[facility]ERGENC[facility] DEPARTMENT Provider Note   CSN: [id number] Arrival date & time: 05/16/22  1805     History  Chief Complaint  Patient presents with   Chest Pain    Cristian Moore is a 81 y.o. male.  The history is provided by the patient and the spouse.  Chest Pain Associated symptoms: no fatigue    Patient with extensive history including CAD, diabetes, obesity presents with chest pain and shortness of breath.  Patient reports about every 2 to 3 days he will have episodes of chest pressure that lasts several hours and shortness of breath.  No fevers or vomiting.  No weakness or diaphoresis.  He reports it is not exacerbated by walking.  He reports it has not been increasing in frequency.  He reports having multiple episodes per month for several months.  He called his cardiologist today about this and was sent to the ER.  He does report being under tremendous stress due to the loss of his son several months ago.   Past Medical History:  Diagnosis Date   Allergy    Arthritis    Asthma    Benign prostatic hypertrophy    Cataract    mild   Chest pain    a. 01/2013 Cath: LM nl, LAD 25p, D1 25p, LCX nl, RCA 25p, PDA nl, RPL nl, EF 55%;  b. 03/2015 MV: EF 59%, no ischemia; c. 03/2016 MV: basal inf/mid inf defect, likely artifact, EF 48%, low risk.   Chronic Dyspnea on exertion    Diabetes mellitus without complication ([place]) 9417   Diverticulitis    Diverticulosis    Exogenous obesity    GERD (gastroesophageal reflux disease)    moe like heart burn   H/O blepharoplasty 07/27/2013   H/O hiatal hernia    Hernia    Umbulical    History of adenomatous polyp of colon 1991   Hyperlipidemia    Hypothyroidism 08/23/2019   Laceration 01/11/2009   Deep laceration left index finger dorsally   Musculoskeletal pain    in the right shoulder - S/P ROTATOR CUFF REPAIR-BUT STILL HAS PAIN   Obstructive sleep apnea     CPAP    AUTO SET 6 TO 20 - USUALLY SETTLES  OUT AT 10   Rectal bleeding    PT ATTRIBUTES TO HEMORRHOIDS   Stroke ([place])    SUPRAVENTRICULAR TACHYCARDIA 08/01/2009   Qualifier: Diagnosis of  By: Lovette Cliche, CNA, [place]     Thyroiditis    Ulcer 2008    Home Medications Prior to Admission medications   Medication Sig Start Date End Date Taking? Authorizing Provider  acetaminophen (TYLENOL) 500 MG tablet Take 1,000 mg by mouth every 6 (six) hours as needed for moderate pain.    [provider]  albuterol (VENTOLIN HFA) 108 (90 Base) MCG/ACT inhaler Inhale 2 puffs into the lungs every 6 (six) hours as needed for wheezing or shortness of breath. 04/06/19   [name], MD  aspirin EC 81 MG tablet Take 81 mg by mouth daily. Swallow whole.    [provider]  calcium carbonate (OS-CAL) 1250 (500 Ca) MG chewable tablet CHEW TWO TABLETS BY MOUTH DAILY 05/27/08   [provider]  Cholecalciferol (VITAMIN D) 50 MCG (2000 UT) tablet Take 2,000 Units by mouth daily.    [provider]  diclofenac Sodium (VOLTAREN) 1 % GEL Apply 2 g topically 4 (four) times daily as needed (shoulders/knees/back pain).    [provider]  escitalopram (LEXAPRO) 10 MG tablet Take 1 tablet (10 mg total) by mouth at bedtime. 02/16/21   [name], MD  famotidine (PEPCID) 10 MG tablet Take 10 mg by mouth 2 (two) times daily as needed for heartburn or indigestion.    [provider]  fluticasone Asencion Islam) 50 MCG/ACT nasal spray USE 2 SPRAYS IN EACH       NOSTRIL DAILY 08/25/21   [name], MD  glucose blood (ONETOUCH VERIO) test strip USE TO CHECK BLOOD SUGAR TWICE DAILY AND AS NEEDED 02/16/21   [name], MD  HYDROcodone-acetaminophen (NORCO/VICODIN) 5-325 MG tablet Take 0.5 tablets by mouth every 6 (six) hours as needed for moderate pain.    [provider]  hydroxypropyl methylcellulose / hypromellose (ISOPTO TEARS / GONIOVISC) 2.5 % ophthalmic solution Place 1 drop into both eyes 4 (four) times daily  as needed for dry eyes.    [provider]  [name] Oil 350 MG CAPS Take 350 mg by mouth daily.    [provider]  metFORMIN (GLUCOPHAGE) 500 MG tablet Take 1 tablet (500 mg total) by mouth daily with breakfast. 05/21/21   [name], MD  metoprolol tartrate (LOPRESSOR) 50 MG tablet Take 0.5 tablets (25 mg total) by mouth 2 (two) times daily. 02/16/21   [name], MD  montelukast (SINGULAIR) 10 MG tablet Take 1 tablet (10 mg total) by mouth at bedtime. Needs office visit for further refills 10/29/21   [name], MD  Multiple Vitamins-Minerals (CENTRUM SILVER PO) Take 1 tablet by mouth daily.    [provider]  nitroGLYCERIN (NITROSTAT) 0.4 MG SL tablet DISSOLVE ONE TABLET UNDER THE TONGUE EVERY 5 MINUTES AS NEEDED FOR CHEST PAIN.  DO NOT EXCEED A TOTAL OF 3 DOSES IN 15 MINUTES 05/16/22   [name], MD  PRESCRIPTION MEDICATION Inhale 1 application into the lungs at bedtime. "CPAP" MACHINE    [provider]  rosuvastatin (CRESTOR) 10 MG tablet Take 1 tablet (10 mg total) by mouth daily. 11/05/21   [name], MD  sucralfate (CARAFATE) 1 g tablet Take 1 tablet (1 g total) by mouth daily. Needed for stomach coating.  Needs office visit for further refills 10/29/21   [name], MD  tamsulosin (FLOMAX) 0.4 MG CAPS capsule Take 2 capsules (0.8 mg total) by mouth daily after supper. 05/21/21   [name], MD  zolpidem (AMBIEN) 10 MG tablet TAKE ONE TABLET AT BEDTIME AS NEEDED FOR SLEEP 11/15/21   [name], MD      Allergies    Penicillins    Review of Systems   Review of Systems  Constitutional:  Negative for fatigue.  Cardiovascular:  Positive for chest pain.    Physical Exam Updated Vital Signs BP (!) 116/57   Pulse (!) 54   Temp 98.9 F (37.2 C)   Resp 16   Ht 1.854 m ('6\' 1"'$ )   Wt 111.1 kg   SpO2 97%   BMI 32.32 kg/m  Physical Exam CONSTITUTIONAL: Elderly, no acute distress HEAD: Normocephalic/atraumatic EYES:  EOMI/PERRL ENMT: Mucous membranes moist NECK: supple no meningeal signs SPINE/BACK:entire spine nontender CV: S1/S2 noted, no murmurs/rubs/gallops noted LUNGS: Lungs are clear to auscultation bilaterally, no apparent distress ABDOMEN: soft, nontender, NEURO: Pt is awake/alert/appropriate, moves all extremitiesx4.  No facial droop.   EXTREMITIES: pulses normal/equal, full ROM SKIN: warm, color normal PSYCH: no abnormalities of mood noted, alert and oriented to situation  ED Results / Procedures / Treatments   Labs (all labs ordered are listed,  but only abnormal results are displayed) Labs Reviewed  CBC - Abnormal; Notable for the following components:      Result Value   WBC 12.3 (*)    All other components within normal limits  BASIC METABOLIC PANEL  BRAIN NATRIURETIC PEPTIDE  TROPONIN I (HIGH SENSITIVITY)  TROPONIN I (HIGH SENSITIVITY)    EKG EKG Interpretation  Date/Time:  Thursday May 16 2022 18:48:51 EST Ventricular Rate:  60 PR Interval:  190 QRS Duration: 82 QT Interval:  406 QTC Calculation: 406 R Axis:   61 Text Interpretation: Normal sinus rhythm Cannot rule out Anterior infarct , age undetermined Abnormal ECG When compared with ECG of 11-Feb-2021 20:04, PREVIOUS ECG IS PRESENT Confirmed by Ripley Fraise [id number]) on 05/16/2022 11:26:39 PM  Radiology DG Chest 2 View  Result Date: 05/16/2022 CLINICAL DATA:  Hervey Ard central chest pain and pressure, short of breath and nausea EXAM: CHEST - 2 VIEW COMPARISON:  04/18/2021 FINDINGS: Frontal and lateral views of the chest demonstrate an unremarkable cardiac silhouette. No airspace disease, effusion, or pneumothorax. There are no acute bony abnormalities. IMPRESSION: 1. No acute intrathoracic process. Electronically Signed   By: Randa Ngo M.D.   On: 05/16/2022 20:21    Procedures Procedures    Medications Ordered in ED Medications  nitroGLYCERIN (NITROSTAT) SL tablet 0.4 mg (0.4 mg Sublingual Given 05/17/22  0130)  aspirin chewable tablet 324 mg (324 mg Oral Given 05/17/22 0132)    ED Course/ Medical Decision Making/ A&P Clinical Course as of 05/17/22 0221  Fri May 17, 2022  0047 WBC(!): 12.3 Mild leukocytosis [DW]  0220 Patient reports he was feeling improved even before receiving nitroglycerin.  He reports feeling back to baseline.  Due to his extensive cardiac history, I did offer admission.  Patient preferred to be discharged home and follow-up closely with his cardiologist. [DW]  0221 He was just prescribed nitroglycerin and will have this on hand.  We discussed appropriate use and when to return to the ER. [DW]  0221 Patient likely with stable angina symptoms.  We discussed what to expect with unstable angina. [DW]  0221   I have low suspicion for aortic dissection or PE at this time [DW]    Clinical Course User Index [DW] Ripley Fraise, MD                           Medical Decision Making Amount and/or Complexity of Data Reviewed Labs:  Decision-making details documented in ED Course.  Risk OTC drugs. Prescription drug management.   This patient presents to the ED for concern of chest pain, this involves an extensive number of treatment options, and is a complaint that carries with it a high risk of complications and morbidity.  The differential diagnosis includes but is not limited to acute coronary syndrome, aortic dissection, pulmonary embolism, pericarditis, pneumothorax, pneumonia, myocarditis, pleurisy, esophageal rupture    Comorbidities that complicate the patient evaluation: Patient's presentation is complicated by their history of CAD, diabetes  Social Determinants of Health: Patient's  recent loss of son   increases the complexity of managing their presentation  Additional history obtained: Additional history obtained from spouse Records reviewed Care Everywhere/External Records  Lab Tests: I Ordered, and personally interpreted labs.  The pertinent results  include: Leukocytosis  Imaging Studies ordered: I ordered imaging studies including X-ray chest   I independently visualized and interpreted imaging which showed no acute findings I agree with the radiologist interpretation  Cardiac Monitoring:  The patient was maintained on a cardiac monitor.  I personally viewed and interpreted the cardiac monitor which showed an underlying rhythm of:  sinus rhythm  Medicines ordered and prescription drug management: I ordered medication including nitroglycerin and aspirin for chest pain Reevaluation of the patient after these medicines showed that the patient    improved  Reevaluation: After the interventions noted above, I reevaluated the patient and found that they have :improved  Complexity of problems addressed: Patient's presentation is most consistent with  acute presentation with potential threat to life or bodily function  Disposition: After consideration of the diagnostic results and the patient's response to treatment,  I feel that the patent would benefit from discharge   .           Final Clinical Impression(s) / ED Diagnoses Final diagnoses:  Precordial pain    Rx / DC Orders ED Discharge Orders          Ordered    Ambulatory referral to Cardiology       Comments: If you have not heard from the Cardiology office within the next 72 hours please call [phone].   05/17/22 5852              Ripley Fraise, MD 05/17/22 0222

## 2022-05-26 NOTE — H&P (View-Only) (Signed)
Cardiology Office Note   Date:  05/29/2022   ID:  Cristian Moore, DOB [birth year], MRN [id number]  PCP:  Adaline Sill, NP  Cardiologist:   [name], MD  Referring:   Adaline Sill, NP   Chief Complaint  Patient presents with   Shortness of Breath       History of Present Illness: Cristian Moore is a 81 y.o. male who presents for follow up of a  history of DM, nonobstructive CAD by cath 2014. In 2017 he  had a [employer] which demonstrated an EF of 48% with a defect present in the basal inferior and mid inferior location. This is most likely due to artifact but  inferior ischemia could not be ruled out.  In August 2022 he was in the hospital with a CVA.   Echo demonstrated a low normal EF of 50 - 55%.  There were no significant valvular abnormalities.  He was treated with Plavix and aspirin.    At the last visit he had SOB.  I sent him for a perfusion study that was negative for ischemia.  Since then he has had increasing shortness of breath.  He actually was sent over to [facility] ER from his primary care office earlier this year and I reviewed some of these records.  Enzymes were negative and BNP was normal.  He was again in the emergency room at our request recently because he had some chest discomfort.  I reviewed these records for this visit.  There was no evidence of ischemia.  EKG was unremarkable.  He was treated with a nitroglycerin without improvement.  He is just recently had PFTs and these results are pending but I looked him over and he does not have any overt findings.  He unfortunately has suffered the loss of his son.  He has has profound tragedy in his family.  He is having a hard time dealing with this.  He has been having chest pressure.  This happens sporadically.  It happens at rest.  He might have shortness of breath at rest or with activity such as climbing a flight of stairs.  He is not really describing PND or orthopnea.      Past  Medical History:  Diagnosis Date   Allergy    Arthritis    Asthma    Benign prostatic hypertrophy    Cataract    mild   Chest pain    a. 01/2013 Cath: LM nl, LAD 25p, D1 25p, LCX nl, RCA 25p, PDA nl, RPL nl, EF 55%;  b. 03/2015 MV: EF 59%, no ischemia; c. 03/2016 MV: basal inf/mid inf defect, likely artifact, EF 48%, low risk.   Chronic Dyspnea on exertion    Diabetes mellitus without complication ([place]) 9794   Diverticulitis    Diverticulosis    Exogenous obesity    GERD (gastroesophageal reflux disease)    moe like heart burn   H/O blepharoplasty 07/27/2013   H/O hiatal hernia    Hernia    Umbulical    History of adenomatous polyp of colon 1991   Hyperlipidemia    Hypothyroidism 08/23/2019   Laceration 01/11/2009   Deep laceration left index finger dorsally   Musculoskeletal pain    in the right shoulder - S/P ROTATOR CUFF REPAIR-BUT STILL HAS PAIN   Obstructive sleep apnea     CPAP    AUTO SET 6 TO 20 - USUALLY SETTLES OUT AT 10   Rectal  bleeding    PT ATTRIBUTES TO HEMORRHOIDS   Stroke ([place])    SUPRAVENTRICULAR TACHYCARDIA 08/01/2009   Qualifier: Diagnosis of  By: Lovette Cliche, CNA, [address]     Thyroiditis    Ulcer 2008    Past Surgical History:  Procedure Laterality Date   COLON SURGERY  07/2012   COLONOSCOPY     for polyps   ELBOW BURSA SURGERY     Eyelid Surgery Bilateral 05/2013   INGUINAL HERNIA REPAIR     KIDNEY STONE SURGERY     KNEE SURGERY     meniscus right knee   LAPAROSCOPIC SIGMOID COLECTOMY  07/23/2012   Procedure: LAPAROSCOPIC SIGMOID COLECTOMY;  Surgeon: [name], MD;  Location: [facility] ORS;  Service: General;  Laterality: N/A;  Laparoscopic Sigmoid Colectomy   LEFT HEART CATHETERIZATION WITH CORONARY ANGIOGRAM N/A 02/22/2013   Procedure: LEFT HEART CATHETERIZATION WITH CORONARY ANGIOGRAM;  Surgeon: [name], MD;  Location[facility] C[facility] LAB;  Service: Cardiovascular;  Laterality: N/A;   PROCTOSCOPY  07/23/2012   Procedure: PROCTOSCOPY;  Surgeon: [name], MD;  Location: [facility];  Service: General;  Laterality: N/A;  Rigid Proctoscopy   ROTATOR CUFF REPAIR     TONSILLECTOMY     TOTAL HIP ARTHROPLASTY Right 05/27/2018   Procedure: RIGHT TOTAL HIP ARTHROPLASTY ANTERIOR APPROACH;  Surgeon: [name], MD;  Location: [facility];  Service: Orthopedics;  Laterality: Right;  [id number]   UMBILICAL HERNIA REPAIR  07/23/2012   Procedure: HERNIA REPAIR UMBILICAL ADULT;  Surgeon: [name], MD;  Location: [facility];  Service: General;  Laterality: N/A;  Primary Umbilical Hernia Repair     Current Outpatient Medications  Medication Sig Dispense Refill   acetaminophen (TYLENOL) 500 MG tablet Take 1,000 mg by mouth every 6 (six) hours as needed for moderate pain.     albuterol (VENTOLIN HFA) 108 (90 Base) MCG/ACT inhaler Inhale 2 puffs into the lungs every 6 (six) hours as needed for wheezing or shortness of breath. 18 g 3   aspirin EC 81 MG tablet Take 81 mg by mouth daily. Swallow whole.     budesonide-formoterol (SYMBICORT) 160-4.5 MCG/ACT inhaler Inhale 2 puffs into the lungs in the morning and at bedtime. 1 each 2   Cholecalciferol (VITAMIN D) 50 MCG (2000 UT) tablet Take 2,000 Units by mouth daily.     diclofenac Sodium (VOLTAREN) 1 % GEL Apply 2 g topically 4 (four) times daily as needed (shoulders/knees/back pain).     escitalopram (LEXAPRO) 10 MG tablet Take 1 tablet (10 mg total) by mouth at bedtime. 90 tablet 3   famotidine (PEPCID) 10 MG tablet Take 10 mg by mouth 2 (two) times daily as needed for heartburn or indigestion.     fluticasone (FLONASE) 50 MCG/ACT nasal spray USE 2 SPRAYS IN EACH       NOSTRIL DAILY 48 g 0   glucose blood (ONETOUCH VERIO) test strip USE TO CHECK BLOOD SUGAR TWICE DAILY AND AS NEEDED 200 each 3   HYDROcodone-acetaminophen (NORCO/VICODIN) 5-325 MG tablet Take 0.5 tablets by mouth every 6 (six) hours as needed for moderate pain.     hydroxypropyl methylcellulose / hypromellose (ISOPTO TEARS / GONIOVISC) 2.5 %  ophthalmic solution Place 1 drop into both eyes 4 (four) times daily as needed for dry eyes.     Krill Oil 350 MG CAPS Take 350 mg by mouth daily.     metFORMIN (GLUCOPHAGE) 500 MG tablet Take 1 tablet (500 mg total) by mouth daily with breakfast.  90 tablet 2   metoprolol tartrate (LOPRESSOR) 50 MG tablet Take 0.5 tablets (25 mg total) by mouth 2 (two) times daily. 90 tablet 3   montelukast (SINGULAIR) 10 MG tablet Take 1 tablet (10 mg total) by mouth at bedtime. Needs office visit for further refills 30 tablet 0   Multiple Vitamins-Minerals (CENTRUM SILVER PO) Take 1 tablet by mouth daily.     nitroGLYCERIN (NITROSTAT) 0.4 MG SL tablet DISSOLVE ONE TABLET UNDER THE TONGUE EVERY 5 MINUTES AS NEEDED FOR CHEST PAIN.  DO NOT EXCEED A TOTAL OF 3 DOSES IN 15 MINUTES 25 tablet 11   PRESCRIPTION MEDICATION Inhale 1 application into the lungs at bedtime. "CPAP" MACHINE     rosuvastatin (CRESTOR) 10 MG tablet Take 1 tablet (10 mg total) by mouth daily. 90 tablet 3   sucralfate (CARAFATE) 1 g tablet Take 1 tablet (1 g total) by mouth daily. Needed for stomach coating.  Needs office visit for further refills 30 tablet 0   tamsulosin (FLOMAX) 0.4 MG CAPS capsule Take 2 capsules (0.8 mg total) by mouth daily after supper. 180 capsule 1   zolpidem (AMBIEN) 10 MG tablet TAKE ONE TABLET AT BEDTIME AS NEEDED FOR SLEEP 90 tablet 1   calcium carbonate (OS-CAL) 1250 (500 Ca) MG chewable tablet CHEW TWO TABLETS BY MOUTH DAILY (Patient not taking: Reported on 05/29/2022)     Current Facility-Administered Medications  Medication Dose Route Frequency Provider Last Rate Last Admin   sodium chloride flush (NS) 0.9 % injection 3 mL  3 mL Intravenous Q12H [name], MD        Allergies:   Penicillins    ROS:  Please see the history of present illness.   Otherwise, review of systems are positive for none .   All other systems are reviewed and negative.    PHYSICAL EXAM: VS:  BP 120/64   Pulse 61   Ht 6' (1.829  m)   Wt 245 lb (111.1 kg)   SpO2 98%   BMI 33.23 kg/m  , BMI Body mass index is 33.23 kg/m. GENERAL:  Well appearing NECK:  No jugular venous distention, waveform within normal limits, carotid upstroke brisk and symmetric, no bruits, no thyromegaly LUNGS:  Clear to auscultation bilaterally CHEST:  Unremarkable HEART:  PMI not displaced or sustained,S1 and S2 within normal limits, no S3, no S4, no clicks, no rubs, no murmurs ABD:  Flat, positive bowel sounds normal in frequency in pitch, no bruits, no rebound, no guarding, no midline pulsatile mass, no hepatomegaly, no splenomegaly EXT:  2 plus pulses throughout, no edema, no cyanosis no clubbing   EKG:  EKG  ordered not today. Sinus rhythm, rate 60, axis within normal limits, intervals, no acute ST-T wave changes.   05/16/2022  Recent Labs: 05/16/2022: B Natriuretic Peptide 4.7; BUN 15; Creatinine, Ser 1.05; Hemoglobin 14.6; Platelets 330; Potassium 4.0; Sodium 136    Lipid Panel    Component Value Date/Time   CHOL 165 05/21/2021 1053   TRIG 293 (H) 05/21/2021 1053   TRIG 111 01/22/2017 1027   HDL 39 (L) 05/21/2021 1053   HDL 39 (L) 01/22/2017 1027   CHOLHDL 4.2 05/21/2021 1053   CHOLHDL 2.5 02/12/2021 0049   VLDL 21 02/12/2021 0049   LDLCALC 78 05/21/2021 1053   LDLCALC 89 01/31/2014 1010      Wt Readings from Last 3 Encounters:  05/29/22 245 lb (111.1 kg)  05/28/22 245 lb (111.1 kg)  05/16/22 245 lb (111.1 kg)  Other studies Reviewed: Additional studies/ records that were reviewed today include: ED records Review of the above records demonstrates:   See elsewhere   ASSESSMENT AND PLAN:   CAD:    The patient has chest discomfort that is increasing.  He did have a small perfusion defect last year on his perfusion imaging.  He is having increasing shortness of breath.  I will set him up for right and left heart cath. The patient understands that risks included but are not limited to stroke (1 in 1000), death (1  in 20), kidney failure [usually temporary] (1 in 500), bleeding (1 in 200), allergic reaction [possibly serious] (1 in 200).  The patient understands and agrees to proceed.   SOB:   This will be evaluated as above.  I do note the chest x-ray and BNP were recently normal.  HTN:   Blood pressure is at target.  No change in therapy.   DYSLIPIDEMIA:    LDL was 78 2022.  Goals of therapy will be based on findings as above.    CVA:   He had a previous event without clear etiology.  SLEEP APNEA:    He uses CPAP.   Current medicines are reviewed at length with the patient today.  The patient does not have concerns regarding medicines.  The following changes have been made:    Labs/ tests ordered today include:     No orders of the defined types were placed in this encounter.   Follow up me in [place] after the cath. Ronnell Gu[place], MD  05/29/2022 11:38 AM    [place]

## 2022-05-26 NOTE — Progress Notes (Signed)
Cardiology Office Note   Date:  05/29/2022   ID:  Cristian Moore, DOB [birth year], MRN [id number]  PCP:  Adaline Sill, NP  Cardiologist:   [name], MD  Referring:   Adaline Sill, NP   Chief Complaint  Patient presents with   Shortness of Breath       History of Present Illness: Cristian Moore is a 81 y.o. male who presents for follow up of a  history of DM, nonobstructive CAD by cath 2014. In 2017 he  had a [employer] which demonstrated an EF of 48% with a defect present in the basal inferior and mid inferior location. This is most likely due to artifact but  inferior ischemia could not be ruled out.  In August 2022 he was in the hospital with a CVA.   Echo demonstrated a low normal EF of 50 - 55%.  There were no significant valvular abnormalities.  He was treated with Plavix and aspirin.    At the last visit he had SOB.  I sent him for a perfusion study that was negative for ischemia.  Since then he has had increasing shortness of breath.  He actually was sent over to [facility] ER from his primary care office earlier this year and I reviewed some of these records.  Enzymes were negative and BNP was normal.  He was again in the emergency room at our request recently because he had some chest discomfort.  I reviewed these records for this visit.  There was no evidence of ischemia.  EKG was unremarkable.  He was treated with a nitroglycerin without improvement.  He is just recently had PFTs and these results are pending but I looked him over and he does not have any overt findings.  He unfortunately has suffered the loss of his son.  He has has profound tragedy in his family.  He is having a hard time dealing with this.  He has been having chest pressure.  This happens sporadically.  It happens at rest.  He might have shortness of breath at rest or with activity such as climbing a flight of stairs.  He is not really describing PND or orthopnea.      Past  Medical History:  Diagnosis Date   Allergy    Arthritis    Asthma    Benign prostatic hypertrophy    Cataract    mild   Chest pain    a. 01/2013 Cath: LM nl, LAD 25p, D1 25p, LCX nl, RCA 25p, PDA nl, RPL nl, EF 55%;  b. 03/2015 MV: EF 59%, no ischemia; c. 03/2016 MV: basal inf/mid inf defect, likely artifact, EF 48%, low risk.   Chronic Dyspnea on exertion    Diabetes mellitus without complication ([place]) 7829   Diverticulitis    Diverticulosis    Exogenous obesity    GERD (gastroesophageal reflux disease)    moe like heart burn   H/O blepharoplasty 07/27/2013   H/O hiatal hernia    Hernia    Umbulical    History of adenomatous polyp of colon 1991   Hyperlipidemia    Hypothyroidism 08/23/2019   Laceration 01/11/2009   Deep laceration left index finger dorsally   Musculoskeletal pain    in the right shoulder - S/P ROTATOR CUFF REPAIR-BUT STILL HAS PAIN   Obstructive sleep apnea     CPAP    AUTO SET 6 TO 20 - USUALLY SETTLES OUT AT 10   Rectal  bleeding    PT ATTRIBUTES TO HEMORRHOIDS   Stroke ([place])    SUPRAVENTRICULAR TACHYCARDIA 08/01/2009   Qualifier: Diagnosis of  By: Lovette Cliche, CNA, [place]     Thyroiditis    Ulcer 2008    Past Surgical History:  Procedure Laterality Date   COLON SURGERY  07/2012   COLONOSCOPY     for polyps   ELBOW BURSA SURGERY     Eyelid Surgery Bilateral 05/2013   INGUINAL HERNIA REPAIR     KIDNEY STONE SURGERY     KNEE SURGERY     meniscus right knee   LAPAROSCOPIC SIGMOID COLECTOMY  07/23/2012   Procedure: LAPAROSCOPIC SIGMOID COLECTOMY;  Surgeon: [name], MD;  Location: [facility]S;  Service: General;  Laterality: N/A;  Laparoscopic Sigmoid Colectomy   LEFT HEART CATHETERIZATION WITH CORONARY ANGIOGRAM N/A 02/22/2013   Procedure: LEFT HEART CATHETERIZATION WITH CORONARY ANGIOGRAM;  Surgeon: [name], MD;  Location: [facility] CATH LAB;  Service: Cardiovascular;  Laterality: N/A;   PROCTOSCOPY  07/23/2012   Procedure: PROCTOSCOPY;  Surgeon: [name], MD;  Location: [facility];  Service: General;  Laterality: N/A;  Rigid Proctoscopy   ROTATOR CUFF REPAIR     TONSILLECTOMY     TOTAL HIP ARTHROPLASTY Right 05/27/2018   Procedure: RIGHT TOTAL HIP ARTHROPLASTY ANTERIOR APPROACH;  Surgeon: [name], MD;  Location: [facility];  Service: Orthopedics;  Laterality: Right;  [id number]   UMBILICAL HERNIA REPAIR  07/23/2012   Procedure: HERNIA REPAIR UMBILICAL ADULT;  Surgeon: [name], MD;  Location: [facility];  Service: General;  Laterality: N/A;  Primary Umbilical Hernia Repair     Current Outpatient Medications  Medication Sig Dispense Refill   acetaminophen (TYLENOL) 500 MG tablet Take 1,000 mg by mouth every 6 (six) hours as needed for moderate pain.     albuterol (VENTOLIN HFA) 108 (90 Base) MCG/ACT inhaler Inhale 2 puffs into the lungs every 6 (six) hours as needed for wheezing or shortness of breath. 18 g 3   aspirin EC 81 MG tablet Take 81 mg by mouth daily. Swallow whole.     budesonide-formoterol (SYMBICORT) 160-4.5 MCG/ACT inhaler Inhale 2 puffs into the lungs in the morning and at bedtime. 1 each 2   Cholecalciferol (VITAMIN D) 50 MCG (2000 UT) tablet Take 2,000 Units by mouth daily.     diclofenac Sodium (VOLTAREN) 1 % GEL Apply 2 g topically 4 (four) times daily as needed (shoulders/knees/back pain).     escitalopram (LEXAPRO) 10 MG tablet Take 1 tablet (10 mg total) by mouth at bedtime. 90 tablet 3   famotidine (PEPCID) 10 MG tablet Take 10 mg by mouth 2 (two) times daily as needed for heartburn or indigestion.     fluticasone (FLONASE) 50 MCG/ACT nasal spray USE 2 SPRAYS IN EACH       NOSTRIL DAILY 48 g 0   glucose blood (ONETOUCH VERIO) test strip USE TO CHECK BLOOD SUGAR TWICE DAILY AND AS NEEDED 200 each 3   HYDROcodone-acetaminophen (NORCO/VICODIN) 5-325 MG tablet Take 0.5 tablets by mouth every 6 (six) hours as needed for moderate pain.     hydroxypropyl methylcellulose / hypromellose (ISOPTO TEARS / GONIOVISC) 2.5 %  ophthalmic solution Place 1 drop into both eyes 4 (four) times daily as needed for dry eyes.     Krill Oil 350 MG CAPS Take 350 mg by mouth daily.     metFORMIN (GLUCOPHAGE) 500 MG tablet Take 1 tablet (500 mg total) by mouth daily with breakfast.  90 tablet 2   metoprolol tartrate (LOPRESSOR) 50 MG tablet Take 0.5 tablets (25 mg total) by mouth 2 (two) times daily. 90 tablet 3   montelukast (SINGULAIR) 10 MG tablet Take 1 tablet (10 mg total) by mouth at bedtime. Needs office visit for further refills 30 tablet 0   Multiple Vitamins-Minerals (CENTRUM SILVER PO) Take 1 tablet by mouth daily.     nitroGLYCERIN (NITROSTAT) 0.4 MG SL tablet DISSOLVE ONE TABLET UNDER THE TONGUE EVERY 5 MINUTES AS NEEDED FOR CHEST PAIN.  DO NOT EXCEED A TOTAL OF 3 DOSES IN 15 MINUTES 25 tablet 11   PRESCRIPTION MEDICATION Inhale 1 application into the lungs at bedtime. "CPAP" MACHINE     rosuvastatin (CRESTOR) 10 MG tablet Take 1 tablet (10 mg total) by mouth daily. 90 tablet 3   sucralfate (CARAFATE) 1 g tablet Take 1 tablet (1 g total) by mouth daily. Needed for stomach coating.  Needs office visit for further refills 30 tablet 0   tamsulosin (FLOMAX) 0.4 MG CAPS capsule Take 2 capsules (0.8 mg total) by mouth daily after supper. 180 capsule 1   zolpidem (AMBIEN) 10 MG tablet TAKE ONE TABLET AT BEDTIME AS NEEDED FOR SLEEP 90 tablet 1   calcium carbonate (OS-CAL) 1250 (500 Ca) MG chewable tablet CHEW TWO TABLETS BY MOUTH DAILY (Patient not taking: Reported on 05/29/2022)     Current Facility-Administered Medications  Medication Dose Route Frequency Provider Last Rate Last Admin   sodium chloride flush (NS) 0.9 % injection 3 mL  3 mL Intravenous Q12H [name], MD        Allergies:   Penicillins    ROS:  Please see the history of present illness.   Otherwise, review of systems are positive for none .   All other systems are reviewed and negative.    PHYSICAL EXAM: VS:  BP 120/64   Pulse 61   Ht 6' (1.829  m)   Wt 245 lb (111.1 kg)   SpO2 98%   BMI 33.23 kg/m  , BMI Body mass index is 33.23 kg/m. GENERAL:  Well appearing NECK:  No jugular venous distention, waveform within normal limits, carotid upstroke brisk and symmetric, no bruits, no thyromegaly LUNGS:  Clear to auscultation bilaterally CHEST:  Unremarkable HEART:  PMI not displaced or sustained,S1 and S2 within normal limits, no S3, no S4, no clicks, no rubs, no murmurs ABD:  Flat, positive bowel sounds normal in frequency in pitch, no bruits, no rebound, no guarding, no midline pulsatile mass, no hepatomegaly, no splenomegaly EXT:  2 plus pulses throughout, no edema, no cyanosis no clubbing   EKG:  EKG  ordered not today. Sinus rhythm, rate 60, axis within normal limits, intervals, no acute ST-T wave changes.   05/16/2022  Recent Labs: 05/16/2022: B Natriuretic Peptide 4.7; BUN 15; Creatinine, Ser 1.05; Hemoglobin 14.6; Platelets 330; Potassium 4.0; Sodium 136    Lipid Panel    Component Value Date/Time   CHOL 165 05/21/2021 1053   TRIG 293 (H) 05/21/2021 1053   TRIG 111 01/22/2017 1027   HDL 39 (L) 05/21/2021 1053   HDL 39 (L) 01/22/2017 1027   CHOLHDL 4.2 05/21/2021 1053   CHOLHDL 2.5 02/12/2021 0049   VLDL 21 02/12/2021 0049   LDLCALC 78 05/21/2021 1053   LDLCALC 89 01/31/2014 1010      Wt Readings from Last 3 Encounters:  05/29/22 245 lb (111.1 kg)  05/28/22 245 lb (111.1 kg)  05/16/22 245 lb (111.1 kg)  Other studies Reviewed: Additional studies/ records that were reviewed today include: ED records Review of the above records demonstrates:   See elsewhere   ASSESSMENT AND PLAN:   CAD:    The patient has chest discomfort that is increasing.  He did have a small perfusion defect last year on his perfusion imaging.  He is having increasing shortness of breath.  I will set him up for right and left heart cath. The patient understands that risks included but are not limited to stroke (1 in 1000), death (1  in 74), kidney failure [usually temporary] (1 in 500), bleeding (1 in 200), allergic reaction [possibly serious] (1 in 200).  The patient understands and agrees to proceed.   SOB:   This will be evaluated as above.  I do note the chest x-ray and BNP were recently normal.  HTN:   Blood pressure is at target.  No change in therapy.   DYSLIPIDEMIA:    LDL was 78 2022.  Goals of therapy will be based on findings as above.    CVA:   He had a previous event without clear etiology.  SLEEP APNEA:    He uses CPAP.   Current medicines are reviewed at length with the patient today.  The patient does not have concerns regarding medicines.  The following changes have been made:    Labs/ tests ordered today include:     No orders of the defined types were placed in this encounter.   Follow up me in [place] after the cath. Ronnell Guadalajara, MD  05/29/2022 11:38 AM    [place]

## 2022-05-28 ENCOUNTER — Ambulatory Visit (INDEPENDENT_AMBULATORY_CARE_PROVIDER_SITE_OTHER): Payer: Medicare HMO | Admitting: Pulmonary Disease

## 2022-05-28 ENCOUNTER — Encounter (HOSPITAL_BASED_OUTPATIENT_CLINIC_OR_DEPARTMENT_OTHER): Payer: Self-pay | Admitting: Pulmonary Disease

## 2022-05-28 ENCOUNTER — Ambulatory Visit (HOSPITAL_BASED_OUTPATIENT_CLINIC_OR_DEPARTMENT_OTHER): Payer: Medicare HMO | Admitting: Pulmonary Disease

## 2022-05-28 VITALS — BP 130/70 | HR 60 | Ht 72.0 in | Wt 245.0 lb

## 2022-05-28 DIAGNOSIS — J439 Emphysema, unspecified: Secondary | ICD-10-CM | POA: Diagnosis not present

## 2022-05-28 DIAGNOSIS — J42 Unspecified chronic bronchitis: Secondary | ICD-10-CM | POA: Diagnosis not present

## 2022-05-28 LAB — PULMONARY FUNCTION TEST
DL/VA % pred: 94 %
DL/VA: 3.6 ml/min/mmHg/L
DLCO cor % pred: 83 %
DLCO cor: 22.07 ml/min/mmHg
DLCO unc % pred: 83 %
DLCO unc: 22.07 ml/min/mmHg
FEF 25-75 Post: 4.29 L/sec
FEF 25-75 Pre: 3.34 L/sec
FEF2575-%Change-Post: 28 %
FEF2575-%Pred-Post: 198 %
FEF2575-%Pred-Pre: 154 %
FEV1-%Change-Post: 7 %
FEV1-%Pred-Post: 106 %
FEV1-%Pred-Pre: 98 %
FEV1-Post: 3.37 L
FEV1-Pre: 3.14 L
FEV1FVC-%Change-Post: 4 %
FEV1FVC-%Pred-Pre: 118 %
FEV6-%Change-Post: 3 %
FEV6-%Pred-Post: 91 %
FEV6-%Pred-Pre: 88 %
FEV6-Post: 3.8 L
FEV6-Pre: 3.68 L
FEV6FVC-%Change-Post: 0 %
FEV6FVC-%Pred-Post: 106 %
FEV6FVC-%Pred-Pre: 105 %
FVC-%Change-Post: 2 %
FVC-%Pred-Post: 85 %
FVC-%Pred-Pre: 83 %
FVC-Post: 3.8 L
FVC-Pre: 3.72 L
Post FEV1/FVC ratio: 89 %
Post FEV6/FVC ratio: 100 %
Pre FEV1/FVC ratio: 84 %
Pre FEV6/FVC Ratio: 99 %
RV % pred: 106 %
RV: 3.06 L
TLC % pred: 94 %
TLC: 7.22 L

## 2022-05-28 MED ORDER — BUDESONIDE-FORMOTEROL FUMARATE 160-4.5 MCG/ACT IN AERO: 2.0000 | INHALATION_SPRAY | Freq: Two times a day (BID) | RESPIRATORY_TRACT | 2 refills | Status: DC

## 2022-05-28 NOTE — Patient Instructions (Signed)
Full PFT Performed Today  

## 2022-05-28 NOTE — Progress Notes (Signed)
Full PFT Performed Today  

## 2022-05-28 NOTE — Progress Notes (Signed)
Subjective:   PATIENT ID: Cristian Moore GENDER: male DOB: 1941/06/17, MRN: [id number]  Chief Complaint  Patient presents with   Consult    asthma    Reason for Visit: New consult for emphysema  [name] is a 81 year old male former smoker with emphysema, OSA on CPAP, chronic diastolic heart failure, SVT, HTN, DM, hx left subcortical lacunar stroke with residual word finding, bilateral carotid artery stenosis, hiatal hernia, GERD, IBS, BPH who presents as a new consult for emphysema.   He was recently seen in the ED two weeks ago for chest pain. Symptoms improved even before receiving nitro. Work-up was neg with tele demonstrating sinus rhythm. Referred to cardiology for stable angina. He has previously been followed by Cardiology and last had stress test neg for ischemia on 06/08/21.  He reports shortness of breath with half a flight of steps. Able to finish but feels winded. Associated wheezing, productive cough. He reports sinus drainage with productive cough. On albuterol 2-3 days a week and will experience shakiness. On flonase and singulair. Previously had symbicort, unsure if this was helpful.  Asthma Control Test ACT Total Score  05/28/2022  9:02 AM 16   Social History: Former smoker. Cigars on and off for 4-5 years [employer] x4 years. No international   I have personally reviewed patient's past medical/family/social history, allergies, current medications.  Past Medical History:  Diagnosis Date   Allergy    Arthritis    Asthma    Benign prostatic hypertrophy    Cataract    mild   Chest pain    a. 01/2013 Cath: LM nl, LAD 25p, D1 25p, LCX nl, RCA 25p, PDA nl, RPL nl, EF 55%;  b. 03/2015 MV: EF 59%, no ischemia; c. 03/2016 MV: basal inf/mid inf defect, likely artifact, EF 48%, low risk.   Chronic Dyspnea on exertion    Diabetes mellitus without complication ([place]) 4098   Diverticulitis    Diverticulosis    Exogenous obesity    GERD (gastroesophageal  reflux disease)    moe like heart burn   H/O blepharoplasty 07/27/2013   H/O hiatal hernia    Hernia    Umbulical    History of adenomatous polyp of colon 1991   Hyperlipidemia    Hypothyroidism 08/23/2019   Laceration 01/11/2009   Deep laceration left index finger dorsally   Musculoskeletal pain    in the right shoulder - S/P ROTATOR CUFF REPAIR-BUT STILL HAS PAIN   Obstructive sleep apnea     CPAP    AUTO SET 6 TO 20 - USUALLY SETTLES OUT AT 10   Rectal bleeding    PT ATTRIBUTES TO HEMORRHOIDS   Stroke ([place])    SUPRAVENTRICULAR TACHYCARDIA 08/01/2009   Qualifier: Diagnosis of  By: Lovette Cliche, CNA, Christy     Thyroiditis    Ulcer 2008     Family History  Problem Relation Age of Onset   Stroke Mother    Heart disease Mother        Valvular heart disease, Rheumatic fever   Lung cancer Maternal Uncle    Diverticulitis Son    Diverticulitis Daughter    Colon cancer Neg Hx    Colon polyps Neg Hx    Esophageal cancer Neg Hx    Rectal cancer Neg Hx    Stomach cancer Neg Hx      Social History   Occupational History   Occupation: Retired    Comment: Social research officer, government  Tobacco Use  Smoking status: Former    Types: Cigars    Quit date: 08/12/1999    Years since quitting: 22.8   Smokeless tobacco: Never   Tobacco comments:    smoked cigars on and off for approx 4-5 yrs  Vaping Use   Vaping Use: Never used  Substance and Sexual Activity   Alcohol use: No   Drug use: No   Sexual activity: Yes    Allergies  Allergen Reactions   Penicillins Rash and Hives    Over 60 years ago Has patient had a PCN reaction causing immediate rash, facial/tongue/throat swelling, SOB or lightheadedness with hypotension: Unknown Has patient had a PCN reaction causing severe rash involving mucus membranes or skin necrosis: Unknown Has patient had a PCN reaction that required hospitalization: No Has patient had a PCN reaction occurring within the last 10 years: No If all of the above answers are  "NO", then may proceed with Cephalosporin use.      Outpatient Medications Prior to Visit  Medication Sig Dispense Refill   acetaminophen (TYLENOL) 500 MG tablet Take 1,000 mg by mouth every 6 (six) hours as needed for moderate pain.     albuterol (VENTOLIN HFA) 108 (90 Base) MCG/ACT inhaler Inhale 2 puffs into the lungs every 6 (six) hours as needed for wheezing or shortness of breath. 18 g 3   aspirin EC 81 MG tablet Take 81 mg by mouth daily. Swallow whole.     calcium carbonate (OS-CAL) 1250 (500 Ca) MG chewable tablet CHEW TWO TABLETS BY MOUTH DAILY     Cholecalciferol (VITAMIN D) 50 MCG (2000 UT) tablet Take 2,000 Units by mouth daily.     diclofenac Sodium (VOLTAREN) 1 % GEL Apply 2 g topically 4 (four) times daily as needed (shoulders/knees/back pain).     escitalopram (LEXAPRO) 10 MG tablet Take 1 tablet (10 mg total) by mouth at bedtime. 90 tablet 3   famotidine (PEPCID) 10 MG tablet Take 10 mg by mouth 2 (two) times daily as needed for heartburn or indigestion.     fluticasone (FLONASE) 50 MCG/ACT nasal spray USE 2 SPRAYS IN EACH       NOSTRIL DAILY 48 g 0   glucose blood (ONETOUCH VERIO) test strip USE TO CHECK BLOOD SUGAR TWICE DAILY AND AS NEEDED 200 each 3   hydroxypropyl methylcellulose / hypromellose (ISOPTO TEARS / GONIOVISC) 2.5 % ophthalmic solution Place 1 drop into both eyes 4 (four) times daily as needed for dry eyes.     Krill Oil 350 MG CAPS Take 350 mg by mouth daily.     metFORMIN (GLUCOPHAGE) 500 MG tablet Take 1 tablet (500 mg total) by mouth daily with breakfast. 90 tablet 2   metoprolol tartrate (LOPRESSOR) 50 MG tablet Take 0.5 tablets (25 mg total) by mouth 2 (two) times daily. 90 tablet 3   montelukast (SINGULAIR) 10 MG tablet Take 1 tablet (10 mg total) by mouth at bedtime. Needs office visit for further refills 30 tablet 0   Multiple Vitamins-Minerals (CENTRUM SILVER PO) Take 1 tablet by mouth daily.     nitroGLYCERIN (NITROSTAT) 0.4 MG SL tablet DISSOLVE ONE  TABLET UNDER THE TONGUE EVERY 5 MINUTES AS NEEDED FOR CHEST PAIN.  DO NOT EXCEED A TOTAL OF 3 DOSES IN 15 MINUTES 25 tablet 11   PRESCRIPTION MEDICATION Inhale 1 application into the lungs at bedtime. "CPAP" MACHINE     rosuvastatin (CRESTOR) 10 MG tablet Take 1 tablet (10 mg total) by mouth daily. 90 tablet 3  sucralfate (CARAFATE) 1 g tablet Take 1 tablet (1 g total) by mouth daily. Needed for stomach coating.  Needs office visit for further refills 30 tablet 0   tamsulosin (FLOMAX) 0.4 MG CAPS capsule Take 2 capsules (0.8 mg total) by mouth daily after supper. 180 capsule 1   zolpidem (AMBIEN) 10 MG tablet TAKE ONE TABLET AT BEDTIME AS NEEDED FOR SLEEP 90 tablet 1   HYDROcodone-acetaminophen (NORCO/VICODIN) 5-325 MG tablet Take 0.5 tablets by mouth every 6 (six) hours as needed for moderate pain. (Patient not taking: Reported on 05/28/2022)     No facility-administered medications prior to visit.    Review of Systems  Constitutional:  Negative for chills, diaphoresis, fever, malaise/fatigue and weight loss.  HENT:  Negative for congestion.   Respiratory:  Positive for shortness of breath. Negative for cough, hemoptysis, sputum production and wheezing.   Cardiovascular:  Negative for chest pain, palpitations and leg swelling.     Objective:   Vitals:   05/28/22 0900  BP: 130/70  Pulse: 60  SpO2: 98%  Weight: 245 lb (111.1 kg)  Height: 6' (1.829 m)   SpO2: 98 % O2 Device: None (Room air)  Physical Exam: General: Well-appearing, no acute distress HENT: [place], AT Eyes: EOMI, no scleral icterus Respiratory: Clear to auscultation bilaterally.  No crackles, wheezing or rales Cardiovascular: RRR, -M/R/G, no JVD Extremities:-Edema,-tenderness Neuro: AAO x4, CNII-XII grossly intact Psych: Normal mood, normal affect  Data Reviewed:  Imaging: CT Chest 12/11/16 - Multiple subcentimeter nodules. Mild emphysema. 05/16/22 - Overall normal. No acute abnormalities PFT: CPET 05/17/22 -  Normal functional capacity  10/04/16 DLCO 61% Interpretation: Mildly reduced gas exchange  05/28/22 FVC 3.8 (85%) FEV1 3.37 (106%) Ratio 84  TLC 94% DLCO 83% Interpretation: No obstructive or restrictive defect. Normal PFTs. No sig BD response   Labs: CBC    Component Value Date/Time   WBC 12.3 (H) 05/16/2022 1934   RBC 4.41 05/16/2022 1934   HGB 14.6 05/16/2022 1934   HGB 15.6 05/21/2021 1053   HCT 42.9 05/16/2022 1934   HCT 46.4 05/21/2021 1053   PLT 330 05/16/2022 1934   PLT 297 05/21/2021 1053   MCV 97.3 05/16/2022 1934   MCV 96 05/21/2021 1053   MCH 33.1 05/16/2022 1934   MCHC 34.0 05/16/2022 1934   RDW 13.3 05/16/2022 1934   RDW 11.9 05/21/2021 1053   LYMPHSABS 2.2 05/21/2021 1053   MONOABS 1.5 (H) 02/11/2021 1839   EOSABS 0.2 05/21/2021 1053   BASOSABS 0.1 05/21/2021 1053   Abs eos - 200     Assessment & Plan:   Discussion: 81 year old male former smoker with emphysema, OSA on CPAP, SVT, HTN, DM, hx left subcortical lacunar stroke with residual word finding, bilateral carotid artery stenosis, hiatal hernia, GERD, IBS, BPH who presents as a new consult for emphysema   Reviewed history and testing above with patient and wife. Pulmonary function tests obtained and reviewed. Normal. No emphysema or asthma present. Symptoms suggest chronic bronchitis. Will trial ICS/LABA and will change to PRN once improved. Addressed questions and concerns.  Chronic bronchitis --START Symbicort 160-4.5 mcg TWO puffs in the morning and evening. Rinse mouth after use --CONTINUE Albuterol AS NEEDED for shortness of breath or wheezing    Health Maintenance Immunization History  Administered Date(s) Administered   Fluad Quad(high Dose 65+) 04/16/2019, 04/13/2021   Influenza Split 03/31/2012, 03/19/2013   Influenza, High Dose Seasonal PF 04/19/2009, 03/27/2012, 03/25/2014, 03/27/2015, 03/27/2016, 03/01/2017, 04/11/2017, 04/08/2018, 05/26/2018   Influenza,inj,Quad PF,6+  Mos  03/19/2013, 03/27/2015, 05/14/2022   Influenza,inj,quad, With Preservative 04/21/2018   Influenza-Unspecified 03/31/2006, 05/22/2007, 03/31/2008, 05/01/2008, 06/20/2010, 01/30/2011, 04/01/2011, 02/28/2014, 03/01/2016, 06/02/2020   Moderna Covid-19 Vaccine Bivalent Booster 67yr & up 05/28/2021   Moderna Sars-Covid-2 Vaccination 07/28/2019, 08/25/2019, 05/10/2020, 10/11/2020   Pneumococcal Conjugate-13 07/08/2013   Pneumococcal Polysaccharide-23 03/31/2005, 07/01/2013, 02/05/2019   Pneumococcal-Unspecified 07/01/2004   Tdap 07/01/2009, 03/02/2011, 04/01/2011   Zoster Recombinat (Shingrix) 06/21/2021   CT Lung Screen - not qualified due to age  Orders Placed This Encounter  Procedures   Pulmonary function test    Standing Status:   Future    Number of Occurrences:   1    Standing Expiration Date:   05/29/2023    Order Specific Question:   Where should this test be performed?    Answer:   [facility]    Order Specific Question:   Full PFT: includes the following: basic spirometry, spirometry pre & post bronchodilator, diffusion capacity (DLCO), lung volumes    Answer:   Full PFT   Meds ordered this encounter  Medications   budesonide-formoterol (SYMBICORT) 160-4.5 MCG/ACT inhaler    Sig: Inhale 2 puffs into the lungs in the morning and at bedtime.    Dispense:  1 each    Refill:  2    Return in about 3 months (around 08/28/2022).  I have spent a total time of 65-minutes on the day of the appointment reviewing prior documentation, coordinating care and discussing medical diagnosis and plan with the patient/family. Imaging, labs and tests included in this note have been reviewed and interpreted independently by me.  [place] MD [facility] 05/28/2022 10:48 AM  Office Number [phone]

## 2022-05-28 NOTE — Patient Instructions (Addendum)
Chronic bronchitis --START Symbicort 160-4.5 mcg TWO puffs in the morning and evening. Rinse mouth after use --CONTINUE Albuterol AS NEEDED for shortness of breath or wheezing  Follow-up in 3 months with me

## 2022-05-29 ENCOUNTER — Encounter: Payer: Self-pay | Admitting: Cardiology

## 2022-05-29 ENCOUNTER — Ambulatory Visit: Payer: Medicare HMO | Attending: Cardiology | Admitting: Cardiology

## 2022-05-29 VITALS — BP 120/64 | HR 61 | Ht 72.0 in | Wt 245.0 lb

## 2022-05-29 DIAGNOSIS — I6330 Cerebral infarction due to thrombosis of unspecified cerebral artery: Secondary | ICD-10-CM

## 2022-05-29 DIAGNOSIS — I2510 Atherosclerotic heart disease of native coronary artery without angina pectoris: Secondary | ICD-10-CM | POA: Diagnosis not present

## 2022-05-29 DIAGNOSIS — R0602 Shortness of breath: Secondary | ICD-10-CM

## 2022-05-29 DIAGNOSIS — E785 Hyperlipidemia, unspecified: Secondary | ICD-10-CM

## 2022-05-29 DIAGNOSIS — I10 Essential (primary) hypertension: Secondary | ICD-10-CM

## 2022-05-29 MED ORDER — SODIUM CHLORIDE 0.9% FLUSH: 3.0000 mL | Freq: Two times a day (BID) | INTRAVENOUS | Status: DC

## 2022-05-29 NOTE — Patient Instructions (Addendum)
     Cardiac/Peripheral Catheterization   You are scheduled for a Cardiac Catheterization on Monday, December 4 with Dr. Harrell Gave End.  1. Please arrive at the Main Entr[facility] [place], [place] [address] on December 4 at 5:30 AM (This time is two hours before your procedure to ensure your preparation). Free valet parking service is available. You will check in at ADMITTING. The support person will be asked to wait in the waiting room.  It is OK to have someone drop you off and come back when you are ready to be discharged.        Special note: Every effort is made to have your procedure done on time. Please understand that emergencies sometimes delay scheduled procedures.   . 2. Diet: Do not eat solid foods after midnight.  You may have clear liquids until 5 AM the day of the procedure.  3. Labs: none needed  4. Medication instructions in preparation for your procedure:   DO NOT TAKE METFORMIN MONDAY 12/4 OR TUESDAY 12/5 OR WEDNESDAY 12/6-RESTART ON THURSDAY 12/7  On the morning of your procedure, take Aspirin 81 mg and any morning medicines NOT listed above.  You may use sips of water.  5. Plan to go home the same day, you will only stay overnight if medically necessary. 6. You MUST have a responsible adult to drive you home. 7. An adult MUST be with you the first 24 hours after you arrive home. 8. Bring a current list of your medications, and the last time and date medication taken. 9. Bring ID and current insurance cards. 10.Please wear clothes that are easy to get on and off and wear slip-on shoes.  Thank you for allowing [place] to care for you!   -- [facility]   Your physician recommends that you schedule a follow-up appointment in: AS SCHEDULED

## 2022-05-30 ENCOUNTER — Telehealth: Payer: Self-pay | Admitting: *Deleted

## 2022-05-30 NOTE — Telephone Encounter (Signed)
Cardiac Catheterization scheduled at [facility] for: Monday June 03, 2022 7:30 AM Arrival time and place: [place] Entrance A at: 5:30 AM  Nothing to eat after midnight prior to procedure, clear liquids until 5 AM day of procedure.  Medication instructions: -Hold:  Metformin-day of procedure and 48 hours post procedure -Except hold medications usual morning medications can be taken with sips of water including aspirin 81 mg.  Confirmed patient has responsible adult to drive home post procedure and be with patient first 24 hours after arriving home.  Patient reports no new symptoms concerning for COVID-19 in the past 10 days.  Reviewed procedure instructions with patient and patient's wife.

## 2022-06-03 ENCOUNTER — Other Ambulatory Visit: Payer: Self-pay

## 2022-06-03 ENCOUNTER — Encounter (HOSPITAL_COMMUNITY): Payer: Self-pay | Admitting: Internal Medicine

## 2022-06-03 ENCOUNTER — Encounter (HOSPITAL_COMMUNITY)
Admission: RE | Disposition: A | Discharge status: Home / Self Care | Payer: Self-pay | Source: Home / Self Care | Attending: Internal Medicine

## 2022-06-03 ENCOUNTER — Ambulatory Visit (HOSPITAL_COMMUNITY)
Admission: RE | Admit: 2022-06-03 | Discharge: 2022-06-03 | Disposition: A | Discharge status: Home / Self Care | Payer: Medicare HMO | Attending: Internal Medicine | Admitting: Internal Medicine

## 2022-06-03 DIAGNOSIS — I10 Essential (primary) hypertension: Secondary | ICD-10-CM | POA: Diagnosis not present

## 2022-06-03 DIAGNOSIS — Z8673 Personal history of transient ischemic attack (TIA), and cerebral infarction without residual deficits: Secondary | ICD-10-CM | POA: Insufficient documentation

## 2022-06-03 DIAGNOSIS — R0602 Shortness of breath: Secondary | ICD-10-CM | POA: Diagnosis present

## 2022-06-03 DIAGNOSIS — Z7982 Long term (current) use of aspirin: Secondary | ICD-10-CM | POA: Diagnosis not present

## 2022-06-03 DIAGNOSIS — Z7902 Long term (current) use of antithrombotics/antiplatelets: Secondary | ICD-10-CM | POA: Diagnosis not present

## 2022-06-03 DIAGNOSIS — G4733 Obstructive sleep apnea (adult) (pediatric): Secondary | ICD-10-CM | POA: Insufficient documentation

## 2022-06-03 DIAGNOSIS — I2510 Atherosclerotic heart disease of native coronary artery without angina pectoris: Secondary | ICD-10-CM | POA: Diagnosis not present

## 2022-06-03 DIAGNOSIS — E785 Hyperlipidemia, unspecified: Secondary | ICD-10-CM | POA: Insufficient documentation

## 2022-06-03 DIAGNOSIS — E119 Type 2 diabetes mellitus without complications: Secondary | ICD-10-CM | POA: Insufficient documentation

## 2022-06-03 DIAGNOSIS — Z7984 Long term (current) use of oral hypoglycemic drugs: Secondary | ICD-10-CM | POA: Diagnosis not present

## 2022-06-03 DIAGNOSIS — I6330 Cerebral infarction due to thrombosis of unspecified cerebral artery: Secondary | ICD-10-CM

## 2022-06-03 HISTORY — PX: RIGHT/LEFT HEART CATH AND CORONARY ANGIOGRAPHY: CATH118266

## 2022-06-03 LAB — POCT I-STAT EG7
Acid-Base Excess: 0 mmol/L (ref 0.0–2.0)
Acid-base deficit: 1 mmol/L (ref 0.0–2.0)
Bicarbonate: 25.6 mmol/L (ref 20.0–28.0)
Bicarbonate: 24.9 mmol/L (ref 20.0–28.0)
Calcium, Ion: 1.27 mmol/L (ref 1.15–1.40)
Calcium, Ion: 1.29 mmol/L (ref 1.15–1.40)
HCT: 38 % — ABNORMAL LOW (ref 39.0–52.0)
HCT: 39 % (ref 39.0–52.0)
Hemoglobin: 12.9 g/dL — ABNORMAL LOW (ref 13.0–17.0)
Hemoglobin: 13.3 g/dL (ref 13.0–17.0)
O2 Saturation: 68 %
O2 Saturation: 70 %
Potassium: 3.7 mmol/L (ref 3.5–5.1)
Potassium: 3.8 mmol/L (ref 3.5–5.1)
Sodium: 139 mmol/L (ref 135–145)
Sodium: 139 mmol/L (ref 135–145)
TCO2: 27 mmol/L (ref 22–32)
TCO2: 26 mmol/L (ref 22–32)
pCO2, Ven: 43.4 mmHg — ABNORMAL LOW (ref 44–60)
pCO2, Ven: 43.2 mmHg — ABNORMAL LOW (ref 44–60)
pH, Ven: 7.378 (ref 7.25–7.43)
pH, Ven: 7.369 (ref 7.25–7.43)
pO2, Ven: 36 mmHg (ref 32–45)
pO2, Ven: 38 mmHg (ref 32–45)

## 2022-06-03 LAB — POCT I-STAT 7, (LYTES, BLD GAS, ICA,H+H)
Acid-Base Excess: 0 mmol/L (ref 0.0–2.0)
Bicarbonate: 24 mmol/L (ref 20.0–28.0)
Calcium, Ion: 1.28 mmol/L (ref 1.15–1.40)
HCT: 39 % (ref 39.0–52.0)
Hemoglobin: 13.3 g/dL (ref 13.0–17.0)
O2 Saturation: 99 %
Potassium: 3.8 mmol/L (ref 3.5–5.1)
Sodium: 138 mmol/L (ref 135–145)
TCO2: 25 mmol/L (ref 22–32)
pCO2 arterial: 36.8 mmHg (ref 32–48)
pH, Arterial: 7.423 (ref 7.35–7.45)
pO2, Arterial: 119 mmHg — ABNORMAL HIGH (ref 83–108)

## 2022-06-03 LAB — GLUCOSE, CAPILLARY
Glucose-Capillary: 109 mg/dL — ABNORMAL HIGH (ref 70–99)
Glucose-Capillary: 107 mg/dL — ABNORMAL HIGH (ref 70–99)

## 2022-06-03 SURGERY — RIGHT/LEFT HEART CATH AND CORONARY ANGIOGRAPHY
Anesthesia: LOCAL

## 2022-06-03 MED ORDER — FENTANYL CITRATE (PF) 100 MCG/2ML IJ SOLN
INTRAMUSCULAR | Status: DC | PRN
  Administered 2022-06-03: 25 ug via INTRAVENOUS

## 2022-06-03 MED ORDER — HYDRALAZINE HCL 20 MG/ML IJ SOLN: 10.0000 mg | INTRAMUSCULAR | Status: DC | PRN

## 2022-06-03 MED ORDER — VERAPAMIL HCL 2.5 MG/ML IV SOLN
INTRAVENOUS | Status: AC
  Filled 2022-06-03: qty 2

## 2022-06-03 MED ORDER — IOHEXOL 350 MG/ML SOLN
INTRAVENOUS | Status: DC | PRN
  Administered 2022-06-03: 45 mL

## 2022-06-03 MED ORDER — SODIUM CHLORIDE 0.9 % WEIGHT BASED INFUSION
3.0000 mL/kg/h | INTRAVENOUS | Status: AC
  Administered 2022-06-03: 3 mL/kg/h via INTRAVENOUS

## 2022-06-03 MED ORDER — SODIUM CHLORIDE 0.9 % IV SOLN: INTRAVENOUS | Status: DC

## 2022-06-03 MED ORDER — HEPARIN SODIUM (PORCINE) 1000 UNIT/ML IJ SOLN
INTRAMUSCULAR | Status: DC | PRN
  Administered 2022-06-03: 5000 [IU] via INTRAVENOUS

## 2022-06-03 MED ORDER — SODIUM CHLORIDE 0.9% FLUSH: 3.0000 mL | Freq: Two times a day (BID) | INTRAVENOUS | Status: DC

## 2022-06-03 MED ORDER — LIDOCAINE HCL (PF) 1 % IJ SOLN
INTRAMUSCULAR | Status: DC | PRN
  Administered 2022-06-03 (×2): 2 mL

## 2022-06-03 MED ORDER — SODIUM CHLORIDE 0.9 % IV SOLN: 250.0000 mL | INTRAVENOUS | Status: DC | PRN

## 2022-06-03 MED ORDER — LIDOCAINE HCL (PF) 1 % IJ SOLN
INTRAMUSCULAR | Status: AC
  Filled 2022-06-03: qty 30

## 2022-06-03 MED ORDER — FENTANYL CITRATE (PF) 100 MCG/2ML IJ SOLN
INTRAMUSCULAR | Status: AC
  Filled 2022-06-03: qty 2

## 2022-06-03 MED ORDER — HEPARIN (PORCINE) IN NACL 1000-0.9 UT/500ML-% IV SOLN
INTRAVENOUS | Status: AC
  Filled 2022-06-03: qty 1000

## 2022-06-03 MED ORDER — SODIUM CHLORIDE 0.9 % WEIGHT BASED INFUSION: 1.0000 mL/kg/h | INTRAVENOUS | Status: DC

## 2022-06-03 MED ORDER — SODIUM CHLORIDE 0.9% FLUSH: 3.0000 mL | INTRAVENOUS | Status: DC | PRN

## 2022-06-03 MED ORDER — ACETAMINOPHEN 325 MG PO TABS: 650.0000 mg | ORAL_TABLET | ORAL | Status: DC | PRN

## 2022-06-03 MED ORDER — ASPIRIN 81 MG PO CHEW: 81.0000 mg | CHEWABLE_TABLET | ORAL | Status: DC

## 2022-06-03 MED ORDER — VERAPAMIL HCL 2.5 MG/ML IV SOLN
INTRAVENOUS | Status: DC | PRN
  Administered 2022-06-03: 10 mL via INTRA_ARTERIAL

## 2022-06-03 MED ORDER — ONDANSETRON HCL 4 MG/2ML IJ SOLN: 4.0000 mg | Freq: Four times a day (QID) | INTRAMUSCULAR | Status: DC | PRN

## 2022-06-03 MED ORDER — MIDAZOLAM HCL 2 MG/2ML IJ SOLN
INTRAMUSCULAR | Status: DC | PRN
  Administered 2022-06-03: 1 mg via INTRAVENOUS

## 2022-06-03 MED ORDER — MIDAZOLAM HCL 2 MG/2ML IJ SOLN
INTRAMUSCULAR | Status: AC
  Filled 2022-06-03: qty 2

## 2022-06-03 MED ORDER — ASPIRIN 81 MG PO CHEW
81.0000 mg | CHEWABLE_TABLET | ORAL | Status: AC
  Administered 2022-06-03: 81 mg via ORAL

## 2022-06-03 MED ORDER — HEPARIN SODIUM (PORCINE) 1000 UNIT/ML IJ SOLN
INTRAMUSCULAR | Status: AC
  Filled 2022-06-03: qty 10

## 2022-06-03 MED ORDER — HEPARIN (PORCINE) IN NACL 1000-0.9 UT/500ML-% IV SOLN
INTRAVENOUS | Status: DC | PRN
  Administered 2022-06-03 (×2): 500 mL

## 2022-06-03 SURGICAL SUPPLY — 12 items
CATH 5FR JL3.5 JR4 ANG PIG MP (CATHETERS) IMPLANT
CATH SWAN GANZ 7F STRAIGHT (CATHETERS) IMPLANT
DEVICE RAD COMP TR BAND LRG (VASCULAR PRODUCTS) IMPLANT
GLIDESHEATH SLEND SS 6F .021 (SHEATH) IMPLANT
GLIDESHEATH SLENDER 7FR .021G (SHEATH) IMPLANT
GUIDEWIRE INQWIRE 1.5J.035X260 (WIRE) IMPLANT
INQWIRE 1.5J .035X260CM (WIRE) ×2
KIT HEART LEFT (KITS) ×1 IMPLANT
PACK CARDIAC CATHETERIZATION (CUSTOM PROCEDURE TRAY) ×1 IMPLANT
SHEATH PROBE COVER 6X72 (BAG) IMPLANT
TRANSDUCER W/STOPCOCK (MISCELLANEOUS) ×1 IMPLANT
TUBING CIL FLEX 10 FLL-RA (TUBING) ×1 IMPLANT

## 2022-06-03 NOTE — Discharge Instructions (Signed)

## 2022-06-03 NOTE — Progress Notes (Signed)
TR BAND REMOVAL  LOCATION:    right radial  DEFLATED PER PROTOCOL:    Yes.    TIME BAND OFF / DRESSING APPLIED:    10:10   SITE UPON ARRIVAL:    Level 0  SITE AFTER BAND REMOVAL:    Level 0  CIRCULATION SENSATION AND MOVEMENT:    Within Normal Limits   Yes.    COMMENTS:   Instructed to remove all dressing and splint on 06/04/22 at 11:00 am.

## 2022-06-03 NOTE — Interval H&P Note (Signed)
History and Physical Interval Note:  06/03/2022 7:10 AM  [name]  has presented today for surgery, with the diagnosis of chest pain.  The various methods of treatment have been discussed with the patient and family. After consideration of risks, benefits and other options for treatment, the patient has consented to  Procedure(s): RIGHT/LEFT HEART CATH AND CORONARY ANGIOGRAPHY (N/A) as a surgical intervention.  The patient's history has been reviewed, patient examined, no change in status, stable for surgery.  I have reviewed the patient's chart and labs.  Questions were answered to the patient's satisfaction.    Cath Lab Visit (complete for each Cath Lab visit)  Clinical Evaluation Leading to the Procedure:   ACS: No.  Non-ACS:    Anginal Classification: CCS IV  Anti-ischemic medical therapy: Minimal Therapy (1 class of medications)  Non-Invasive Test Results: Low-risk stress test findings: cardiac mortality <1%/year  Prior CABG: No previous CABG  Rania Prothero

## 2022-06-12 ENCOUNTER — Other Ambulatory Visit: Payer: Self-pay

## 2022-06-12 ENCOUNTER — Emergency Department (HOSPITAL_BASED_OUTPATIENT_CLINIC_OR_DEPARTMENT_OTHER)
Admission: EM | Admit: 2022-06-12 | Discharge: 2022-06-12 | Disposition: A | Discharge status: Home / Self Care | Payer: Medicare HMO | Attending: Emergency Medicine | Admitting: Emergency Medicine

## 2022-06-12 ENCOUNTER — Emergency Department (HOSPITAL_BASED_OUTPATIENT_CLINIC_OR_DEPARTMENT_OTHER): Payer: Medicare HMO

## 2022-06-12 DIAGNOSIS — M549 Dorsalgia, unspecified: Secondary | ICD-10-CM | POA: Insufficient documentation

## 2022-06-12 DIAGNOSIS — R1031 Right lower quadrant pain: Secondary | ICD-10-CM | POA: Insufficient documentation

## 2022-06-12 DIAGNOSIS — Z7982 Long term (current) use of aspirin: Secondary | ICD-10-CM | POA: Insufficient documentation

## 2022-06-12 LAB — COMPREHENSIVE METABOLIC PANEL
ALT: 10 U/L (ref 0–44)
AST: 13 U/L — ABNORMAL LOW (ref 15–41)
Albumin: 4.1 g/dL (ref 3.5–5.0)
Alkaline Phosphatase: 34 U/L — ABNORMAL LOW (ref 38–126)
Anion gap: 8 (ref 5–15)
BUN: 10 mg/dL (ref 8–23)
CO2: 27 mmol/L (ref 22–32)
Calcium: 9.3 mg/dL (ref 8.9–10.3)
Chloride: 100 mmol/L (ref 98–111)
Creatinine, Ser: 0.92 mg/dL (ref 0.61–1.24)
GFR, Estimated: >60 mL/min (ref >60)
Glucose, Bld: 105 mg/dL — ABNORMAL HIGH (ref 70–99)
Potassium: 3.9 mmol/L (ref 3.5–5.1)
Sodium: 135 mmol/L (ref 135–145)
Total Bilirubin: 0.5 mg/dL (ref 0.3–1.2)
Total Protein: 6.3 g/dL — ABNORMAL LOW (ref 6.5–8.1)

## 2022-06-12 LAB — CBC WITH DIFFERENTIAL/PLATELET
Abs Immature Granulocytes: 0.02 10*3/uL (ref 0.00–0.07)
Basophils Absolute: 0.1 10*3/uL (ref 0.0–0.1)
Basophils Relative: 1 %
Eosinophils Absolute: 0.3 10*3/uL (ref 0.0–0.5)
Eosinophils Relative: 3 %
HCT: 40.6 % (ref 39.0–52.0)
Hemoglobin: 13.7 g/dL (ref 13.0–17.0)
Immature Granulocytes: 0 %
Lymphocytes Relative: 24 %
Lymphs Abs: 2.1 10*3/uL (ref 0.7–4.0)
MCH: 32.6 pg (ref 26.0–34.0)
MCHC: 33.7 g/dL (ref 30.0–36.0)
MCV: 96.7 fL (ref 80.0–100.0)
Monocytes Absolute: 1.1 10*3/uL — ABNORMAL HIGH (ref 0.1–1.0)
Monocytes Relative: 13 %
Neutro Abs: 5 10*3/uL (ref 1.7–7.7)
Neutrophils Relative %: 59 %
Platelets: 279 10*3/uL (ref 150–400)
RBC: 4.2 MIL/uL — ABNORMAL LOW (ref 4.22–5.81)
RDW: 13.2 % (ref 11.5–15.5)
WBC: 8.5 10*3/uL (ref 4.0–10.5)
nRBC: 0 % (ref 0.0–0.2)

## 2022-06-12 LAB — URINALYSIS, ROUTINE W REFLEX MICROSCOPIC
Bilirubin Urine: NEGATIVE
Glucose, UA: NEGATIVE mg/dL
Hgb urine dipstick: NEGATIVE
Ketones, ur: NEGATIVE mg/dL
Leukocytes,Ua: NEGATIVE
Nitrite: NEGATIVE
Protein, ur: NEGATIVE mg/dL
Specific Gravity, Urine: 1.01 (ref 1.005–1.030)
pH: 5 (ref 5.0–8.0)

## 2022-06-12 NOTE — ED Provider Notes (Signed)
[facility] EMERGENCY DEPT Provider Note   CSN: [id number] Arrival date & time: 06/12/22  1041     History  Chief Complaint  Patient presents with   Flank Pain    Cristian Moore is a 81 y.o. male.  HPI 81 year old male with history of previous kidney stones as well as chronic back pain presents with right back pain.  Came on suddenly yesterday.  Was different than the back pain has been dealing with.  It was primarily right side of midline.  He was not sure if he was having pain in his right leg or not because the pain was so severe.  Ultimately he took 2 tramadol last night at around 8 PM.  No urinary symptoms, vomiting or abdominal pain.  No incontinence.  No weakness or numbness in the legs.  Today the pain is not nearly as bad and seems to come and go.  Home Medications Prior to Admission medications   Medication Sig Start Date End Date Taking? Authorizing Provider  acetaminophen (TYLENOL) 500 MG tablet Take 1,000 mg by mouth every 6 (six) hours as needed for moderate pain.    [provider]  albuterol (VENTOLIN HFA) 108 (90 Base) MCG/ACT inhaler Inhale 2 puffs into the lungs every 6 (six) hours as needed for wheezing or shortness of breath. 04/06/19   [name], MD  aspirin EC 81 MG tablet Take 81 mg by mouth daily. Swallow whole.    [provider]  budesonide-formoterol (SYMBICORT) 160-4.5 MCG/ACT inhaler Inhale 2 puffs into the lungs in the morning and at bedtime. 05/28/22   Margaretha Seeds, MD  calcium carbonate (OS-CAL) 1250 (500 Ca) MG chewable tablet Chew 2 tablets by mouth daily. 05/27/08   [provider]  Cholecalciferol (VITAMIN D) 50 MCG (2000 UT) tablet Take 2,000 Units by mouth daily.    [provider]  diclofenac Sodium (VOLTAREN) 1 % GEL Apply 2 g topically 4 (four) times daily as needed (shoulders/knees/back pain).    [provider]  escitalopram (LEXAPRO) 10 MG tablet Take 1 tablet (10 mg  total) by mouth at bedtime. 02/16/21   [name], MD  famotidine (PEPCID) 10 MG tablet Take 10 mg by mouth 2 (two) times daily as needed for heartburn or indigestion.    [provider]  fluticasone (FLONASE) 50 MCG/ACT nasal spray USE 2 SPRAYS IN EACH       NOSTRIL DAILY Patient taking differently: Place 2 sprays into both nostrils at bedtime. 08/25/21   [name], MD  glucose blood (ONETOUCH VERIO) test strip USE TO CHECK BLOOD SUGAR TWICE DAILY AND AS NEEDED 02/16/21   [name], MD  HYDROcodone-acetaminophen (NORCO/VICODIN) 5-325 MG tablet Take 0.5 tablets by mouth every 6 (six) hours as needed for moderate pain.    [provider]  hydroxypropyl methylcellulose / hypromellose (ISOPTO TEARS / GONIOVISC) 2.5 % ophthalmic solution Place 1 drop into both eyes 4 (four) times daily as needed for dry eyes.    [provider]  [name] Oil 350 MG CAPS Take 350 mg by mouth daily.    [provider]  metFORMIN (GLUCOPHAGE) 500 MG tablet Take 1 tablet (500 mg total) by mouth daily with breakfast. 05/21/21   [name], MD  montelukast (SINGULAIR) 10 MG tablet Take 1 tablet (10 mg total) by mouth at bedtime. Needs office visit for further refills 10/29/21   [name], MD  Multiple Vitamins-Minerals (CENTRUM SILVER PO) Take 1 tablet by mouth daily.  [provider]  nitroGLYCERIN (NITROSTAT) 0.4 MG SL tablet DISSOLVE ONE TABLET UNDER THE TONGUE EVERY 5 MINUTES AS NEEDED FOR CHEST PAIN.  DO NOT EXCEED A TOTAL OF 3 DOSES IN 15 MINUTES 05/16/22   [name], MD  PRESCRIPTION MEDICATION Inhale 1 application into the lungs at bedtime. "CPAP" MACHINE    [provider]  rosuvastatin (CRESTOR) 10 MG tablet Take 1 tablet (10 mg total) by mouth daily. 11/05/21   [name], MD  sucralfate (CARAFATE) 1 g tablet Take 1 tablet (1 g total) by mouth daily. Needed for stomach coating.  Needs office visit for further refills Patient taking  differently: Take 1 g by mouth daily as needed (Needed for stomach coating.). Needs office visit for further refills 10/29/21   [name], MD  tamsulosin (FLOMAX) 0.4 MG CAPS capsule Take 2 capsules (0.8 mg total) by mouth daily after supper. Patient taking differently: Take 0.4 mg by mouth daily after supper. 05/21/21   [name], MD  zolpidem (AMBIEN) 10 MG tablet TAKE ONE TABLET AT BEDTIME AS NEEDED FOR SLEEP Patient taking differently: Take 10 mg by mouth at bedtime. 11/15/21   [name], MD      Allergies    Penicillins    Review of Systems   Review of Systems  Gastrointestinal:  Negative for abdominal pain and vomiting.  Genitourinary:  Negative for dysuria.  Musculoskeletal:  Positive for back pain.  Neurological:  Negative for weakness and numbness.    Physical Exam Updated Vital Signs BP 129/65   Pulse 66   Temp 98.2 F (36.8 C) (Oral)   Resp 13   Ht 6' (1.829 m)   Wt 108.9 kg   SpO2 97%   BMI 32.55 kg/m  Physical Exam Vitals and nursing note reviewed.  Constitutional:      Appearance: He is well-developed.  HENT:     Head: Normocephalic and atraumatic.  Cardiovascular:     Rate and Rhythm: Normal rate and regular rhythm.     Heart sounds: Normal heart sounds.  Pulmonary:     Effort: Pulmonary effort is normal.     Breath sounds: Normal breath sounds.  Abdominal:     Palpations: Abdomen is soft.     Tenderness: There is no abdominal tenderness. There is no right CVA tenderness or left CVA tenderness.  Musculoskeletal:     Lumbar back: Tenderness present. No bony tenderness.       Back:  Skin:    General: Skin is warm and dry.  Neurological:     Mental Status: He is alert.     Comments: 5/5 strength in BLE. Grossly normal sensation.     ED Results / Procedures / Treatments   Labs (all labs ordered are listed, but only abnormal results are displayed) Labs Reviewed  COMPREHENSIVE METABOLIC PANEL - Abnormal; Notable for the following  components:      Result Value   Glucose, Bld 105 (*)    Total Protein 6.3 (*)    AST 13 (*)    Alkaline Phosphatase 34 (*)    All other components within normal limits  CBC WITH DIFFERENTIAL/PLATELET - Abnormal; Notable for the following components:   RBC 4.20 (*)    Monocytes Absolute 1.1 (*)    All other components within normal limits  URINALYSIS, ROUTINE W REFLEX MICROSCOPIC    EKG None  Radiology CT Renal Stone Study  Result Date: 06/12/2022 CLINICAL DATA:  Right flank pain since yesterday. EXAM: CT ABDOMEN AND  PELVIS WITHOUT CONTRAST TECHNIQUE: Multidetector CT imaging of the abdomen and pelvis was performed following the standard protocol without IV contrast. RADIATION DOSE REDUCTION: This exam was performed according to the departmental dose-optimization program which includes automated exposure control, adjustment of the mA and/or kV according to patient size and/or use of iterative reconstruction technique. COMPARISON:  04/28/2012 FINDINGS: Lower chest: The lung bases are clear of an acute process. No worrisome pulmonary lesions or pleural effusions. The heart is normal in size. No pericardial effusion. Aortic and coronary artery calcifications are noted. Hepatobiliary: Simple 4 cm right hepatic lobe cyst. No worrisome hepatic lesions or intrahepatic biliary dilatation. The gallbladder is unremarkable. No common bile duct dilatation. Pancreas: No mass, inflammation or ductal dilatation. Spleen: Normal size.  No focal lesions. Adrenals/Urinary Tract: The adrenal glands are normal. There are numerous bilateral renal cysts all measuring less than 15 Hounsfield units. No worrisome renal lesions are identified without contrast. No further imaging evaluation or follow-up is necessary. 9 mm left renal calculus but no obstructing ureteral calculi or hydroureteronephrosis. The bladder is grossly normal. Stomach/Bowel: The stomach, duodenum, small bowel and colon are unremarkable. No acute  inflammatory process, mass lesions or obstructive findings. The terminal ileum and appendix are normal. Surgical changes involving the mid sigmoid colon. Scattered colonic diverticulosis. Vascular/Lymphatic: Age related atherosclerotic calcifications involving the aorta and iliac arteries but no aneurysm. No mesenteric or retroperitoneal mass or adenopathy. Incidental retroaortic left renal vein. Reproductive: The prostate gland and seminal vesicles are unremarkable. Other: No pelvic mass or adenopathy. No free pelvic fluid collections. No inguinal mass or adenopathy. No abdominal wall hernia or subcutaneous lesions. Musculoskeletal: Total right hip arthroplasty noted. No complicating features. Left convex lumbar scoliosis and degenerative lumbar spondylosis but no acute bony findings. IMPRESSION: 1. No acute abdominal/pelvic findings, mass lesions or adenopathy. 2. 9 mm left renal calculus but no obstructing ureteral calculi or hydroureteronephrosis. 3. Numerous bilateral renal cysts. 4. Age related atherosclerotic calcifications involving the aorta and iliac arteries. Aortic Atherosclerosis (ICD10-I70.0). Electronically Signed   By: Marijo Sanes M.D.   On: 06/12/2022 13:06    Procedures Procedures    Medications Ordered in ED Medications - No data to display  ED Course/ Medical Decision Making/ A&P                           Medical Decision Making Amount and/or Complexity of Data Reviewed Labs: ordered.    Details: Normal WBC and renal function.  No UTI. Radiology: ordered and independent interpretation performed.    Details: No ureteral stone or hydronephrosis.   Given the location of patient's pain and the severity and sudden onset, CT was obtained to help rule out ureteral stone.  This is overall unremarkable.  Has some cysts but otherwise no emergent findings.  Urinalysis is clear.  Pain seems to be gradually improving since onset yesterday.  Unclear what caused it but with no evidence of  AAA on the CT, or other emergent condition, I do not think he has an emergent process going on and is stable for outpatient management with his spine specialist.  No red flags on the neuroexam.  Will discharge home with return precautions.        Final Clinical Impression(s) / ED Diagnoses Final diagnoses:  Acute right-sided back pain, unspecified back location    Rx / DC Orders ED Discharge Orders     None         Sherwood Gambler,  MD 06/12/22 1356

## 2022-06-12 NOTE — Discharge Instructions (Addendum)
If you develop worsening, recurrent, or continued back pain, numbness or weakness in the legs, incontinence of your bowels or bladders, numbness of your buttocks, fever, abdominal pain, or any other new/concerning symptoms then return to the ER for evaluation.  

## 2022-06-12 NOTE — ED Triage Notes (Signed)
In for eval of sudden right flank pain onset yesterday. Took 2 tramadol last pm with some relief. Difficulty sleeping due pain. Decreased urine flow. Denies dysuria. Hx of kidney stones. Denies N/V.

## 2022-06-12 NOTE — ED Notes (Signed)
Discharge paperwork given and verbally understood. 

## 2022-08-25 NOTE — Progress Notes (Signed)
Cardiology Office Note   Date:  08/28/2022   ID:  Cristian Moore 04-Mar-1941, MRN [id number]  PCP:  Adaline Sill, NP  Cardiologist:   [name], MD  Referring:   Adaline Sill, NP   Chief Complaint  Patient presents with   Shortness of Breath     History of Present Illness: Cristian Moore is a 82 y.o. male who presents for follow up of a  history of DM, nonobstructive CAD by cath 2014. In 2017 he  had a [employer] which demonstrated an EF of 48% with a defect present in the basal inferior and mid inferior location. This is most likely due to artifact but  inferior ischemia could not be ruled out.  In August 2022 he was in the hospital with a CVA.   Echo demonstrated a low normal EF of 50 - 55%.  There were no significant valvular abnormalities.  He was treated with Plavix and aspirin.    He had progressive SOB.  I sent him for cath and he had only 30% proximal LAD stenosis.  He still has some chronic dyspnea with exertion but he thinks this might be related to weight and deconditioning.  He is not having any chest pain.  He said he does have some tingling and discomfort down his leg.  He was told that he had some abnormal screening peripheral vascular study by home health nurse but he does not have those results today.   Past Medical History:  Diagnosis Date   Allergy    Arthritis    Asthma    Benign prostatic hypertrophy    Cataract    mild   Chest pain    a. 01/2013 Cath: LM nl, LAD 25p, D1 25p, LCX nl, RCA 25p, PDA nl, RPL nl, EF 55%;  b. 03/2015 MV: EF 59%, no ischemia; c. 03/2016 MV: basal inf/mid inf defect, likely artifact, EF 48%, low risk.   Chronic Dyspnea on exertion    Diabetes mellitus without complication ([place]) [id number]   Diverticulitis    Diverticulosis    Exogenous obesity    GERD (gastroesophageal reflux disease)    moe like heart burn   H/O blepharoplasty 07/27/2013   H/O hiatal hernia    Hernia    Umbulical    History of  adenomatous polyp of colon 1991   Hyperlipidemia    Hypothyroidism 08/23/2019   Laceration 01/11/2009   Deep laceration left index finger dorsally   Musculoskeletal pain    in the right shoulder - S/P ROTATOR CUFF REPAIR-BUT STILL HAS PAIN   Obstructive sleep apnea     CPAP    AUTO SET 6 TO 20 - USUALLY SETTLES OUT AT 10   Rectal bleeding    PT ATTRIBUTES TO HEMORRHOIDS   Stroke ([place])    SUPRAVENTRICULAR TACHYCARDIA 08/01/2009   Qualifier: Diagnosis of  By: Lovette Cliche, CNA, [place]     Thyroiditis    Ulcer 2008    Past Surgical History:  Procedure Laterality Date   COLON SURGERY  07/2012   COLONOSCOPY     for polyps   ELBOW BURSA SURGERY     Eyelid Surgery Bilateral 05/2013   INGUINAL HERNIA REPAIR     KIDNEY STONE SURGERY     KNEE SURGERY     meniscus right knee   LAPAROSCOPIC SIGMOID COLECTOMY  07/23/2012   Procedure: LAPAROSCOPIC SIGMOID COLECTOMY;  Surgeon: [name], MD;  Location: [facility];  Service:  General;  Laterality: N/A;  Laparoscopic Sigmoid Colectomy   LEFT HEART CATHETERIZATION WITH CORONARY ANGIOGRAM N/A 02/22/2013   Procedure: LEFT HEART CATHETERIZATION WITH CORONARY ANGIOGRAM;  Surgeon: [name], MD;  Location: [facility] CATH LAB;  Service: Cardiovascular;  Laterality: N/A;   PROCTOSCOPY  07/23/2012   Procedure: PROCTOSCOPY;  Surgeon: [name], MD;  Location: [facility];  Service: General;  Laterality: N/A;  Rigid Proctoscopy   RIGHT/LEFT HEART CATH AND CORONARY ANGIOGRAPHY N/A 06/03/2022   Procedure: RIGHT/LEFT HEART CATH AND CORONARY ANGIOGRAPHY;  Surgeon: Nelva Bush, MD;  Location: [place] CV LAB;  Service: Cardiovascular;  Laterality: N/A;   ROTATOR CUFF REPAIR     TONSILLECTOMY     TOTAL HIP ARTHROPLASTY Right 05/27/2018   Procedure: RIGHT TOTAL HIP ARTHROPLASTY ANTERIOR APPROACH;  Surgeon: [name], MD;  Location: [facility];  Service: Orthopedics;  Laterality: Right;  [id number]   UMBILICAL HERNIA REPAIR  07/23/2012   Procedure: HERNIA REPAIR  UMBILICAL ADULT;  Surgeon: [name], MD;  Location: [facility];  Service: General;  Laterality: N/A;  Primary Umbilical Hernia Repair     Current Outpatient Medications  Medication Sig Dispense Refill   acetaminophen (TYLENOL) 500 MG tablet Take 1,000 mg by mouth every 6 (six) hours as needed for moderate pain.     albuterol (VENTOLIN HFA) 108 (90 Base) MCG/ACT inhaler Inhale 2 puffs into the lungs every 6 (six) hours as needed for wheezing or shortness of breath. 18 g 3   aspirin EC 81 MG tablet Take 81 mg by mouth daily. Swallow whole.     budesonide-formoterol (SYMBICORT) 160-4.5 MCG/ACT inhaler Inhale 2 puffs into the lungs in the morning and at bedtime. 1 each 2   calcium carbonate (OS-CAL) 1250 (500 Ca) MG chewable tablet Chew 2 tablets by mouth daily.     Cholecalciferol (VITAMIN D) 50 MCG (2000 UT) tablet Take 2,000 Units by mouth daily.     diclofenac Sodium (VOLTAREN) 1 % GEL Apply 2 g topically 4 (four) times daily as needed (shoulders/knees/back pain).     escitalopram (LEXAPRO) 10 MG tablet Take 1 tablet (10 mg total) by mouth at bedtime. 90 tablet 3   famotidine (PEPCID) 10 MG tablet Take 10 mg by mouth 2 (two) times daily as needed for heartburn or indigestion.     fluticasone (FLONASE) 50 MCG/ACT nasal spray USE 2 SPRAYS IN EACH       NOSTRIL DAILY (Patient taking differently: Place 2 sprays into both nostrils at bedtime.) 48 g 0   hydroxypropyl methylcellulose / hypromellose (ISOPTO TEARS / GONIOVISC) 2.5 % ophthalmic solution Place 1 drop into both eyes 4 (four) times daily as needed for dry eyes.     Krill Oil 350 MG CAPS Take 350 mg by mouth daily.     metFORMIN (GLUCOPHAGE) 500 MG tablet Take 1 tablet (500 mg total) by mouth daily with breakfast. 90 tablet 2   montelukast (SINGULAIR) 10 MG tablet Take 1 tablet (10 mg total) by mouth at bedtime. Needs office visit for further refills 30 tablet 0   Multiple Vitamins-Minerals (CENTRUM SILVER PO) Take 1 tablet by mouth daily.      nitroGLYCERIN (NITROSTAT) 0.4 MG SL tablet DISSOLVE ONE TABLET UNDER THE TONGUE EVERY 5 MINUTES AS NEEDED FOR CHEST PAIN.  DO NOT EXCEED A TOTAL OF 3 DOSES IN 15 MINUTES 25 tablet 11   rosuvastatin (CRESTOR) 10 MG tablet Take 1 tablet (10 mg total) by mouth daily. 90 tablet 3   sucralfate (CARAFATE)  1 g tablet Take 1 tablet (1 g total) by mouth daily. Needed for stomach coating.  Needs office visit for further refills (Patient taking differently: Take 1 g by mouth daily as needed (Needed for stomach coating.). Needs office visit for further refills) 30 tablet 0   tamsulosin (FLOMAX) 0.4 MG CAPS capsule Take 2 capsules (0.8 mg total) by mouth daily after supper. (Patient taking differently: Take 0.4 mg by mouth daily after supper.) 180 capsule 1   traMADol (ULTRAM) 50 MG tablet Take 50 mg by mouth every 6 (six) hours as needed.     zolpidem (AMBIEN) 10 MG tablet TAKE ONE TABLET AT BEDTIME AS NEEDED FOR SLEEP (Patient taking differently: Take 10 mg by mouth at bedtime.) 90 tablet 1   glucose blood (ONETOUCH VERIO) test strip USE TO CHECK BLOOD SUGAR TWICE DAILY AND AS NEEDED 200 each 3   PRESCRIPTION MEDICATION Inhale 1 application into the lungs at bedtime. "CPAP" MACHINE     Current Facility-Administered Medications  Medication Dose Route Frequency Provider Last Rate Last Admin   sodium chloride flush (NS) 0.9 % injection 3 mL  3 mL Intravenous Q12H [name], MD        Allergies:   Penicillins    ROS:  Please see the history of present illness.   Otherwise, review of systems are positive for none .   All other systems are reviewed and negative.    PHYSICAL EXAM: VS:  BP 120/72   Pulse 84   Ht 6' (1.829 m)   Wt 249 lb (112.9 kg)   BMI 33.77 kg/m  , BMI Body mass index is 33.77 kg/m. GENERAL:  Well appearing NECK:  No jugular venous distention, waveform within normal limits, carotid upstroke brisk and symmetric, no bruits, no thyromegaly LUNGS:  Clear to auscultation  bilaterally CHEST:  Unremarkable HEART:  PMI not displaced or sustained,S1 and S2 within normal limits, no S3, no S4, no clicks, no rubs, no murmurs ABD:  Flat, positive bowel sounds normal in frequency in pitch, no bruits, no rebound, no guarding, no midline pulsatile mass, no hepatomegaly, no splenomegaly EXT:  2 plus pulses throughout, no edema, no cyanosis no clubbing   EKG:  EKG not ordered not today.   Recent Labs: 05/16/2022: B Natriuretic Peptide 4.7 06/12/2022: ALT 10; BUN 10; Creatinine, Ser 0.92; Hemoglobin 13.7; Platelets 279; Potassium 3.9; Sodium 135    Lipid Panel    Component Value Date/Time   CHOL 165 05/21/2021 1053   TRIG 293 (H) 05/21/2021 1053   TRIG 111 01/22/2017 1027   HDL 39 (L) 05/21/2021 1053   HDL 39 (L) 01/22/2017 1027   CHOLHDL 4.2 05/21/2021 1053   CHOLHDL 2.5 02/12/2021 0049   VLDL 21 02/12/2021 0049   LDLCALC 78 05/21/2021 1053   LDLCALC 89 01/31/2014 1010      Wt Readings from Last 3 Encounters:  08/28/22 249 lb (112.9 kg)  06/12/22 240 lb (108.9 kg)  06/03/22 240 lb (108.9 kg)      Other studies Reviewed: Additional studies/ records that were reviewed today include: nONE Review of the above records demonstrates:   See elsewhere   ASSESSMENT AND PLAN:   CAD:    He had some mild nonobstructive plaque.  He will continue with primary risk reduction.  SOB:   He had normal chest x-ray, BNP.  He had PFTs.  He has the cath results as above.  I do not see a clear etiology for shortness of breath and it  might be weight and deconditioning.  No further workup.  HTN:   The blood pressure is at target.  No change in therapy.   DYSLIPIDEMIA:    LDL 78 with an HDL of 39.  She had a lipid profile next time he sees his primary provider.   CVA:   He had a previous event without clear etiology.  He does not seem to have residual.   SLEEP APNEA:   He would like to see Dr. Halford Chessman for adjustment of his CPAP and I have sent a message to his primary  provider.   ABNORMAL ABIs: He reports these but I do not have these results and he has excellent pulses.  He will bring these results to me and I will follow-up as needed.  I do not think that any leg pain he is having  is secondary to peripheral vascular disease.   Current medicines are reviewed at length with the patient today.  The patient does not have concerns regarding medicines.  The following changes have been made:  None  Labs/ tests ordered today include:   None  No orders of the defined types were placed in this encounter.   Follow up me in 12 months.      Signed, [name], MD  08/28/2022 10:08 AM    [facility]

## 2022-08-28 ENCOUNTER — Encounter: Payer: Self-pay | Admitting: Cardiology

## 2022-08-28 ENCOUNTER — Ambulatory Visit: Payer: Medicare HMO | Admitting: Cardiology

## 2022-08-28 ENCOUNTER — Ambulatory Visit (HOSPITAL_BASED_OUTPATIENT_CLINIC_OR_DEPARTMENT_OTHER): Payer: Medicare HMO | Admitting: Pulmonary Disease

## 2022-08-28 VITALS — BP 120/72 | HR 84 | Ht 72.0 in | Wt 249.0 lb

## 2022-08-28 DIAGNOSIS — E785 Hyperlipidemia, unspecified: Secondary | ICD-10-CM

## 2022-08-28 DIAGNOSIS — R0602 Shortness of breath: Secondary | ICD-10-CM

## 2022-08-28 DIAGNOSIS — I6330 Cerebral infarction due to thrombosis of unspecified cerebral artery: Secondary | ICD-10-CM | POA: Diagnosis not present

## 2022-08-28 NOTE — Patient Instructions (Signed)
Medication Instructions:  The current medical regimen is effective;  continue present plan and medications.  *If you need a refill on your cardiac medications before your next appointment, please call your pharmacy*  Follow-Up: At [facility], you and your health needs are our priority.  As part of our continuing mission to provide you with exceptional heart care, we have created designated Provider Care Teams.  These Care Teams include your primary Cardiologist (physician) and Advanced Practice Providers (APPs -  Physician Assistants and Nurse Practitioners) who all work together to provide you with the care you need, when you need it.  We recommend signing up for the patient portal called "MyChart".  Sign up information is provided on this After Visit Summary.  MyChart is used to connect with patients for Virtual Visits (Telemedicine).  Patients are able to view lab/test results, encounter notes, upcoming appointments, etc.  Non-urgent messages can be sent to your provider as well.   To learn more about what you can do with MyChart, go to [email or web address].    Follow up as needed with Dr Percival Spanish.

## 2022-08-29 ENCOUNTER — Ambulatory Visit (HOSPITAL_BASED_OUTPATIENT_CLINIC_OR_DEPARTMENT_OTHER): Payer: Medicare HMO | Admitting: Pulmonary Disease

## 2022-10-18 ENCOUNTER — Other Ambulatory Visit: Payer: Self-pay | Admitting: Family Medicine

## 2022-11-04 ENCOUNTER — Other Ambulatory Visit: Payer: Self-pay | Admitting: Family Medicine

## 2022-11-23 ENCOUNTER — Other Ambulatory Visit: Payer: Self-pay

## 2022-11-23 ENCOUNTER — Emergency Department (HOSPITAL_COMMUNITY): Payer: No Typology Code available for payment source

## 2022-11-23 ENCOUNTER — Encounter (HOSPITAL_COMMUNITY): Payer: Self-pay | Admitting: *Deleted

## 2022-11-23 ENCOUNTER — Emergency Department (HOSPITAL_COMMUNITY)
Admission: EM | Admit: 2022-11-23 | Discharge: 2022-11-23 | Disposition: A | Discharge status: Home / Self Care | Payer: No Typology Code available for payment source | Attending: Emergency Medicine | Admitting: Emergency Medicine

## 2022-11-23 DIAGNOSIS — R200 Anesthesia of skin: Secondary | ICD-10-CM | POA: Diagnosis not present

## 2022-11-23 DIAGNOSIS — R531 Weakness: Secondary | ICD-10-CM | POA: Insufficient documentation

## 2022-11-23 DIAGNOSIS — R251 Tremor, unspecified: Secondary | ICD-10-CM | POA: Insufficient documentation

## 2022-11-23 DIAGNOSIS — R42 Dizziness and giddiness: Secondary | ICD-10-CM | POA: Diagnosis present

## 2022-11-23 DIAGNOSIS — R55 Syncope and collapse: Secondary | ICD-10-CM | POA: Insufficient documentation

## 2022-11-23 DIAGNOSIS — I10 Essential (primary) hypertension: Secondary | ICD-10-CM | POA: Insufficient documentation

## 2022-11-23 LAB — CBC
HCT: 41.1 % (ref 39.0–52.0)
Hemoglobin: 14 g/dL (ref 13.0–17.0)
MCH: 32 pg (ref 26.0–34.0)
MCHC: 34.1 g/dL (ref 30.0–36.0)
MCV: 94.1 fL (ref 80.0–100.0)
Platelets: 266 10*3/uL (ref 150–400)
RBC: 4.37 MIL/uL (ref 4.22–5.81)
RDW: 13.2 % (ref 11.5–15.5)
WBC: 8.4 10*3/uL (ref 4.0–10.5)
nRBC: 0 % (ref 0.0–0.2)

## 2022-11-23 LAB — URINALYSIS, ROUTINE W REFLEX MICROSCOPIC
Bilirubin Urine: NEGATIVE
Glucose, UA: NEGATIVE mg/dL
Hgb urine dipstick: NEGATIVE
Ketones, ur: NEGATIVE mg/dL
Leukocytes,Ua: NEGATIVE
Nitrite: NEGATIVE
Protein, ur: NEGATIVE mg/dL
Specific Gravity, Urine: 1.017 (ref 1.005–1.030)
pH: 9 — ABNORMAL HIGH (ref 5.0–8.0)

## 2022-11-23 LAB — COMPREHENSIVE METABOLIC PANEL
ALT: 18 U/L (ref 0–44)
AST: 23 U/L (ref 15–41)
Albumin: 3.9 g/dL (ref 3.5–5.0)
Alkaline Phosphatase: 35 U/L — ABNORMAL LOW (ref 38–126)
Anion gap: 10 (ref 5–15)
BUN: 14 mg/dL (ref 8–23)
CO2: 23 mmol/L (ref 22–32)
Calcium: 9.6 mg/dL (ref 8.9–10.3)
Chloride: 103 mmol/L (ref 98–111)
Creatinine, Ser: 1.02 mg/dL (ref 0.61–1.24)
GFR, Estimated: >60 mL/min (ref >60)
Glucose, Bld: 117 mg/dL — ABNORMAL HIGH (ref 70–99)
Potassium: 3.6 mmol/L (ref 3.5–5.1)
Sodium: 136 mmol/L (ref 135–145)
Total Bilirubin: 1.1 mg/dL (ref 0.3–1.2)
Total Protein: 6.9 g/dL (ref 6.5–8.1)

## 2022-11-23 LAB — DIFFERENTIAL
Abs Immature Granulocytes: 0.02 10*3/uL (ref 0.00–0.07)
Basophils Absolute: 0.1 10*3/uL (ref 0.0–0.1)
Basophils Relative: 1 %
Eosinophils Absolute: 0.1 10*3/uL (ref 0.0–0.5)
Eosinophils Relative: 2 %
Immature Granulocytes: 0 %
Lymphocytes Relative: 21 %
Lymphs Abs: 1.8 10*3/uL (ref 0.7–4.0)
Monocytes Absolute: 0.9 10*3/uL (ref 0.1–1.0)
Monocytes Relative: 11 %
Neutro Abs: 5.5 10*3/uL (ref 1.7–7.7)
Neutrophils Relative %: 65 %

## 2022-11-23 LAB — PROTIME-INR
INR: 1 (ref 0.8–1.2)
Prothrombin Time: 13.1 seconds (ref 11.4–15.2)

## 2022-11-23 LAB — RAPID URINE DRUG SCREEN, HOSP PERFORMED
Amphetamines: NOT DETECTED
Barbiturates: NOT DETECTED
Benzodiazepines: NOT DETECTED
Cocaine: NOT DETECTED
Opiates: NOT DETECTED
Tetrahydrocannabinol: NOT DETECTED

## 2022-11-23 LAB — TROPONIN I (HIGH SENSITIVITY)
Troponin I (High Sensitivity): 4 ng/L (ref <18)
Troponin I (High Sensitivity): 4 ng/L (ref <18)

## 2022-11-23 LAB — ETHANOL: Alcohol, Ethyl (B): <10 mg/dL (ref <10)

## 2022-11-23 LAB — APTT: aPTT: 25 seconds (ref 24–36)

## 2022-11-23 LAB — CBG MONITORING, ED: Glucose-Capillary: 116 mg/dL — ABNORMAL HIGH (ref 70–99)

## 2022-11-23 MED ORDER — IOHEXOL 350 MG/ML SOLN
75.0000 mL | Freq: Once | INTRAVENOUS | Status: AC | PRN
  Administered 2022-11-23: 75 mL via INTRAVENOUS

## 2022-11-23 NOTE — ED Notes (Signed)
Pt ambulated to the bathroom with no assistance. Gait appeared normal and steady.

## 2022-11-23 NOTE — Discharge Instructions (Signed)
You were seen in the emergency department for an episode of feeling like you are going to pass out associated with arm tremors and some numbness.  You had blood work CAT scan of your head and neck and neurology evaluation.  There is no definite evidence of a stroke.  Will be important for you to follow-up with your primary care doctor and your cardiologist.  Neurology is recommended that you do not drive until you are followed up with your doctors and cleared.  Continue your regular medication.  Return to the emergency department if any worsening or concerning symptoms

## 2022-11-23 NOTE — Progress Notes (Signed)
Code stroke  Call time  825am Beeper   827 Start   833 End   836 Epic   836 Rad called  [phone]

## 2022-11-23 NOTE — Consult Note (Signed)
[facility] Telemedicine Consult   Requesting Provider: Gardner Candle Consult Participants: Patient, bedside nurses, telestroke nurses Location of the provider: [facility] Location of the patient: [facility]  This consult was provided via telemedicine with 2-way video and audio communication. The patient/family was informed that care would be provided in this way and agreed to receive care in this manner.    Chief Complaint: Left sided tingling  HPI: 82 year old male who presents with bilateral hand tingling and numbness, lightheadedness, shortness of breath.  He was driving earlier when he suddenly became very lightheaded.  He states that he felt like his heart was racing at the time.  He then noticed that his tremors, which he does notice at baseline, were markedly worse than typical.  These were bilateral with preserved consciousness.  He then noticed that he had tingling of his hands bilaterally, though possibly slightly worse on the left than right.  He also notices trouble walking and therefore is brought into the emergency department where code stroke was activated.    LKW: 7 AM tpa given?: No, rapidly improving symptoms IR Thrombectomy? No, mild symptoms Modified Rankin Scale: 1-No significant post stroke disability and can perform usual duties with stroke symptoms Time of teleneurologist evaluation: 8:35  Exam: Vitals:   11/23/22 0835  Pulse: 66  Resp: 19  SpO2: 99%    General: in bed, nad  1A: Level of Consciousness - 0 1B: Ask Month and Age - 0 1C: 'Blink Eyes' & 'Squeeze Hands' - 0 2: Test Horizontal Extraocular Movements - 0 3: Test Visual Fields - 0 4: Test Facial Palsy - 0 5A: Test Left Arm Motor Drift - 0 5B: Test Right Arm Motor Drift - 0 6A: Test Left Leg Motor Drift - 0 6B: Test Right Leg Motor Drift - 0 7: Test Limb Ataxia - 0(intentional tremor bilaterally but L > R) 8: Test Sensation - 0 9: Test Language/Aphasia- 0 10: Test Dysarthria -  0 11: Test Extinction/Inattention - 0 NIHSS score: 0   Imaging Reviewed: CT head - negative  Labs reviewed in epic and pertinent values follow: CBG - 116   Assessment: 82 year old male with transient lightheadedness, bilateral arm shaking with preserved consciousness, bilateral tingling, shortness of breath and feeling of racing heart.  My suspicion is that this represents presyncope, though unusual for her to happen while sitting.  Given the abrupt onset, I would rule out an abrupt basilar occlusion, though my suspicion for this is low.  If this is negative, then I would favor likely not on neurological etiology given the bilateral positive symptoms associated with shortness of breath and lightheadedness.  Recommendations:  1) CTA head and neck 2) could check orthostatic vital signs, EKG 3) defer further workup for presyncope to ER if CT is negative.   Ritta Slot, MD [facility] [phone]  If 7pm- 7am, please page neurology on call as listed in AMION.

## 2022-11-23 NOTE — ED Provider Notes (Signed)
[place] EMERGENCY DEPARTMENT AT [facility] Provider Note   CSN: [id number] Arrival date & time: 11/23/22  4098     History  No chief complaint on file.   Cristian Moore is a 82 y.o. male.  He has a history of hypertension hyperlipidemia stroke (sounds like he might of gotten tPA).  At around 715 he was driving in a car when he felt trembling in his hands and felt short of breath.  It progressed to numbness and weakness on his left arm and left leg and difficulty with speech.  He called EMS and they transported him here.  He feels like his symptoms are improved although not resolved.  Has a mild headache and some blurry vision.  Still feels weak in his left arm and left leg.  His prior neurologic event he had right-sided symptoms.  This was in August 2022.   The history is provided by the patient and the EMS personnel.  Cerebrovascular Accident This is a new problem. The current episode started 1 to 2 hours ago. The problem occurs constantly. The problem has been gradually improving. Associated symptoms include headaches and shortness of breath. Pertinent negatives include no chest pain and no abdominal pain. The symptoms are aggravated by walking. Nothing relieves the symptoms. He has tried rest for the symptoms. The treatment provided mild relief.       Home Medications Prior to Admission medications   Medication Sig Start Date End Date Taking? Authorizing Provider  acetaminophen (TYLENOL) 500 MG tablet Take 1,000 mg by mouth every 6 (six) hours as needed for moderate pain.    [provider]  albuterol (VENTOLIN HFA) 108 (90 Base) MCG/ACT inhaler Inhale 2 puffs into the lungs every 6 (six) hours as needed for wheezing or shortness of breath. 04/06/19   Mechele Claude, MD  aspirin EC 81 MG tablet Take 81 mg by mouth daily. Swallow whole.    [provider]  budesonide-formoterol (SYMBICORT) 160-4.5 MCG/ACT inhaler Inhale 2 puffs into the lungs in the  morning and at bedtime. 05/28/22   Luciano Cutter, MD  calcium carbonate (OS-CAL) 1250 (500 Ca) MG chewable tablet Chew 2 tablets by mouth daily. 05/27/08   [provider]  Cholecalciferol (VITAMIN D) 50 MCG (2000 UT) tablet Take 2,000 Units by mouth daily.    [provider]  diclofenac Sodium (VOLTAREN) 1 % GEL Apply 2 g topically 4 (four) times daily as needed (shoulders/knees/back pain).    [provider]  escitalopram (LEXAPRO) 10 MG tablet Take 1 tablet (10 mg total) by mouth at bedtime. 02/16/21   Mechele Claude, MD  famotidine (PEPCID) 10 MG tablet Take 10 mg by mouth 2 (two) times daily as needed for heartburn or indigestion.    [provider]  fluticasone (FLONASE) 50 MCG/ACT nasal spray USE 2 SPRAYS IN EACH       NOSTRIL DAILY Patient taking differently: Place 2 sprays into both nostrils at bedtime. 08/25/21   Mechele Claude, MD  glucose blood (ONETOUCH VERIO) test strip USE TO CHECK BLOOD SUGAR TWICE DAILY AND AS NEEDED 02/16/21   Mechele Claude, MD  hydroxypropyl methylcellulose / hypromellose (ISOPTO TEARS / GONIOVISC) 2.5 % ophthalmic solution Place 1 drop into both eyes 4 (four) times daily as needed for dry eyes.    [provider]  Boris Lown Oil 350 MG CAPS Take 350 mg by mouth daily.    [provider]  metFORMIN (GLUCOPHAGE) 500 MG tablet Take 1 tablet (500  mg total) by mouth daily with breakfast. 05/21/21   Mechele Claude, MD  montelukast (SINGULAIR) 10 MG tablet Take 1 tablet (10 mg total) by mouth at bedtime. Needs office visit for further refills 10/29/21   Mechele Claude, MD  Multiple Vitamins-Minerals (CENTRUM SILVER PO) Take 1 tablet by mouth daily.    [provider]  nitroGLYCERIN (NITROSTAT) 0.4 MG SL tablet DISSOLVE ONE TABLET UNDER THE TONGUE EVERY 5 MINUTES AS NEEDED FOR CHEST PAIN.  DO NOT EXCEED A TOTAL OF 3 DOSES IN 15 MINUTES 05/16/22   [name], MD  PRESCRIPTION MEDICATION Inhale 1 application into  the lungs at bedtime. "CPAP" MACHINE    [provider]  rosuvastatin (CRESTOR) 10 MG tablet Take 1 tablet (10 mg total) by mouth daily. 11/05/21   Mechele Claude, MD  sucralfate (CARAFATE) 1 g tablet Take 1 tablet (1 g total) by mouth daily. Needed for stomach coating.  Needs office visit for further refills Patient taking differently: Take 1 g by mouth daily as needed (Needed for stomach coating.). Needs office visit for further refills 10/29/21   Mechele Claude, MD  tamsulosin (FLOMAX) 0.4 MG CAPS capsule Take 2 capsules (0.8 mg total) by mouth daily after supper. Patient taking differently: Take 0.4 mg by mouth daily after supper. 05/21/21   Mechele Claude, MD  traMADol (ULTRAM) 50 MG tablet Take 50 mg by mouth every 6 (six) hours as needed.    [provider]  zolpidem (AMBIEN) 10 MG tablet TAKE ONE TABLET AT BEDTIME AS NEEDED FOR SLEEP Patient taking differently: Take 10 mg by mouth at bedtime. 11/15/21   Mechele Claude, MD      Allergies    Penicillins    Review of Systems   Review of Systems  Constitutional:  Negative for fever.  Eyes:  Positive for visual disturbance.  Respiratory:  Positive for shortness of breath.   Cardiovascular:  Negative for chest pain.  Gastrointestinal:  Negative for abdominal pain.  Genitourinary:  Negative for dysuria.  Neurological:  Positive for tremors, speech difficulty, weakness, numbness and headaches.    Physical Exam Updated Vital Signs BP 130/70 (BP Location: Right Arm)   Pulse 70   Temp 98.3 F (36.8 C) (Oral)   Resp 18   Ht 6' (1.829 m)   Wt 82 kg   SpO2 99%   BMI 24.52 kg/m  Physical Exam Vitals and nursing note reviewed.  Constitutional:      General: He is not in acute distress.    Appearance: Normal appearance. He is well-developed.  HENT:     Head: Normocephalic and atraumatic.  Eyes:     Conjunctiva/sclera: Conjunctivae normal.  Cardiovascular:     Rate and Rhythm: Normal rate and regular rhythm.      Heart sounds: No murmur heard. Pulmonary:     Effort: Pulmonary effort is normal. No respiratory distress.     Breath sounds: Normal breath sounds.  Abdominal:     Palpations: Abdomen is soft.     Tenderness: There is no abdominal tenderness. There is no guarding or rebound.  Musculoskeletal:        General: No deformity. Normal range of motion.     Cervical back: Neck supple.  Skin:    General: Skin is warm and dry.     Capillary Refill: Capillary refill takes less than 2 seconds.  Neurological:     Mental Status: He is alert and oriented to person, place, and time.     Cranial Nerves:  No cranial nerve deficit.     Sensory: No sensory deficit.     Motor: No weakness.     ED Results / Procedures / Treatments   Labs (all labs ordered are listed, but only abnormal results are displayed) Labs Reviewed  COMPREHENSIVE METABOLIC PANEL - Abnormal; Notable for the following components:      Result Value   Glucose, Bld 117 (*)    Alkaline Phosphatase 35 (*)    All other components within normal limits  URINALYSIS, ROUTINE W REFLEX MICROSCOPIC - Abnormal; Notable for the following components:   pH 9.0 (*)    All other components within normal limits  CBG MONITORING, ED - Abnormal; Notable for the following components:   Glucose-Capillary 116 (*)    All other components within normal limits  ETHANOL  PROTIME-INR  APTT  CBC  DIFFERENTIAL  RAPID URINE DRUG SCREEN, HOSP PERFORMED  TROPONIN I (HIGH SENSITIVITY)  TROPONIN I (HIGH SENSITIVITY)    EKG EKG Interpretation  Date/Time:  Saturday Nov 23 2022 08:30:48 EDT Ventricular Rate:  68 PR Interval:  66 QRS Duration: 107 QT Interval:  408 QTC Calculation: 438 R Axis:   29 Text Interpretation: Sinus rhythm Short PR interval Low voltage, precordial leads ST elevation, consider inferior injury No significant change since prior 11/23 Confirmed by Meridee Score [phone]) on 11/23/2022 8:32:29 AM  Radiology CT ANGIO HEAD NECK W WO  CM  Result Date: 11/23/2022 CLINICAL DATA:  Stroke workup. Numbness in both arms with inability to gait words out and trouble speaking EXAM: CT ANGIOGRAPHY HEAD AND NECK WITH AND WITHOUT CONTRAST TECHNIQUE: Multidetector CT imaging of the head and neck was performed using the standard protocol during bolus administration of intravenous contrast. Multiplanar CT image reconstructions and MIPs were obtained to evaluate the vascular anatomy. Carotid stenosis measurements (when applicable) are obtained utilizing NASCET criteria, using the distal internal carotid diameter as the denominator. RADIATION DOSE REDUCTION: This exam was performed according to the departmental dose-optimization program which includes automated exposure control, adjustment of the mA and/or kV according to patient size and/or use of iterative reconstruction technique. CONTRAST:  75mL OMNIPAQUE IOHEXOL 350 MG/ML SOLN COMPARISON:  02/11/2021 FINDINGS: CTA NECK FINDINGS Aortic arch: 2 vessel branching.  No dilatation or dissection. Right carotid system: Scattered calcified plaque along the common carotid and proximal ICA without flow reducing stenosis or ulceration. Negative for beading or dissection Left carotid system: Scattered primarily calcified plaque along the common carotid and proximal ICA. The left ICA is smaller than the right in the setting of aplastic left A1 segment. No flow reducing stenosis, beading, or ulceration Vertebral arteries: No proximal subclavian stenosis. The vertebral arteries are smoothly contoured and widely patent. Skeleton: Ordinary cervical spine degeneration. No acute or aggressive finding. Other neck: Mild-to-moderate mucosal thickening in the maxillary sinuses. Upper chest: No acute finding Review of the MIP images confirms the above findings CTA HEAD FINDINGS Anterior circulation: Atheromatous calcification along the carotid siphons with mild narrowing at the anterior genu on the left. No branch occlusion,  beading, or aneurysm. Posterior circulation: The vertebral and basilar arteries are smoothly contoured and widely patent. Hypoplastic right P1 segment. High-grade stenosis at the right P1 2 junction, progressed. No branch occlusion or beading. Negative for aneurysm Venous sinuses: Unremarkable Anatomic variants: As above Review of the MIP images confirms the above findings IMPRESSION: 1. No emergent vascular finding. 2. High-grade right proximal PCA stenosis, progressed from 2022. 3. Extensive atherosclerosis in the cervical carotids and carotid siphons without  flow reducing stenosis or ulceration. Electronically Signed   By: Tiburcio Pea M.D.   On: 11/23/2022 09:50   CT HEAD CODE STROKE WO CONTRAST  Result Date: 11/23/2022 CLINICAL DATA:  Code stroke.  Sudden onset of right-sided weakness EXAM: CT HEAD WITHOUT CONTRAST TECHNIQUE: Contiguous axial images were obtained from the base of the skull through the vertex without intravenous contrast. RADIATION DOSE REDUCTION: This exam was performed according to the departmental dose-optimization program which includes automated exposure control, adjustment of the mA and/or kV according to patient size and/or use of iterative reconstruction technique. COMPARISON:  02/12/2021 FINDINGS: Brain: No evidence of acute infarction, hemorrhage, hydrocephalus, or mass lesion/mass effect. Suggested subdural hygroma along the anterior right frontal lobe measuring 5 mm in thickness, unchanged. Vascular: No hyperdense vessel or unexpected calcification. Skull: Normal. Negative for fracture or focal lesion. Sinuses/Orbits: No acute finding. Other: Prelim sent in epic chat ASPECTS [facility] Stroke Program Early CT Score) - Ganglionic level infarction (caudate, lentiform nuclei, internal capsule, insula, M1-M3 cortex): 7 - Supraganglionic infarction (M4-M6 cortex): 3 Total score (0-10 with 10 being normal): 10 IMPRESSION: No acute or interval finding. Electronically Signed   By:  Tiburcio Pea M.D.   On: 11/23/2022 08:46    Procedures .Critical Care  Performed by: Terrilee Files, MD Authorized by: Terrilee Files, MD   Critical care provider statement:    Critical care time (minutes):  45   Critical care time was exclusive of:  Separately billable procedures and treating other patients   Critical care was necessary to treat or prevent imminent or life-threatening deterioration of the following conditions:  CNS failure or compromise   Critical care was time spent personally by me on the following activities:  Development of treatment plan with patient or surrogate, discussions with consultants, evaluation of patient's response to treatment, examination of patient, obtaining history from patient or surrogate, ordering and performing treatments and interventions, ordering and review of laboratory studies, ordering and review of radiographic studies, pulse oximetry, re-evaluation of patient's condition and review of old charts   I assumed direction of critical care for this patient from another provider in my specialty: no       Medications Ordered in ED Medications  iohexol (OMNIPAQUE) 350 MG/ML injection 75 mL (75 mLs Intravenous Contrast Given 11/23/22 0913)    ED Course/ Medical Decision Making/ A&P Clinical Course as of 11/23/22 1643  Sat Nov 23, 2022  0850 Received update from radiology that CT head noncontrast was negative for any acute findings. [MB]  [id number] Patient had a tele neurology evaluation by Dr. Amada Jupiter.  He did not feel the symptomatology was consistent with an acute stroke and has not recommending tPA or further imaging at this time.  He said consideration for cardiac event or near syncope.  Did not feel the patient needed admission or transfer for MRI from neurologic standpoint.  Also did recommend that the patient probably should not drive until he is evaluated by his PCP or cardiologist as the symptoms began while he was seated driving a  car. [MB]  0905 Dr. Amada Jupiter messaged back with me and is wanting the patient to get a CTA just to make sure it does not a basilar thrombosis.  Patient's creatinine is good and has only an allergy to penicillin. [MB]  1351 Patient is ambulated in the department and feels back to baseline.  His workup has been fairly unremarkable.  He is comfortable plan for discharge and outpatient follow-up.  Return instructions  discussed [MB]    Clinical Course User Index [MB] Terrilee Files, MD                             Medical Decision Making Amount and/or Complexity of Data Reviewed Labs: ordered. Radiology: ordered.   This patient complains of tremor near syncope unsteady gait left-sided numbness and weakness; this involves an extensive number of treatment Options and is a complaint that carries with it a high risk of complications and morbidity. The differential includes stroke, TIA, hypoglycemia, near syncope, seizure, hypotension, dehydration, metabolic derangement  I ordered, reviewed and interpreted labs, which included CBC normal chemistries normal other than mildly elevated glucose LFTs normal urinalysis negative talk screen negative troponins flat I ordered imaging studies which included CT head CT angio head and neck and I independently    visualized and interpreted imaging which showed no acute findings.  Does have intracranial stenosis Additional history obtained from EMS Previous records obtained and reviewed in epic including prior neurology and discharge summaries I consulted neurology Dr. Amada Jupiter and discussed lab and imaging findings and discussed disposition.  Cardiac monitoring reviewed, normal sinus rhythm Social determinants considered, no significant barriers Critical Interventions: None  After the interventions stated above, I reevaluated the patient and found patient to be awake alert nonfocal exam ambulatory in the department without any difficulty Admission and  further testing considered, no indications for admission or further workup at this time.  Will have patient follow-up with PCP and recommended not driving until cleared.  Return instructions discussed with he and his wife.         Final Clinical Impression(s) / ED Diagnoses Final diagnoses:  Near syncope  Tremor    Rx / DC Orders ED Discharge Orders     None         Terrilee Files, MD 11/23/22 [phone]

## 2022-11-23 NOTE — ED Triage Notes (Signed)
Pt brought in by [facility] for c/o numbness to both arms and right leg; pt states he was unable to get out  his words and was having trouble walking  Pt states he woke up at 7am normal and while he was driving to his friends house the symptoms started around 715  Since he has arrived by ems all symptoms have resolved except he has some numbness to his left hand and pt is c/o headache and blurry vision  Cbg 116

## 2022-11-23 NOTE — Progress Notes (Signed)
1610 Stroke cart activated and elert sent to [facility]. EDP Charm Barges, MD) at bedside assessing pt. Pt arrived via EMS with c/o SOB, numbness/tingling to bilat hands, unable to get words out and trouble walking. LKW [id number] pt to CT 0833 [facility] paged [phone] [facility] Amada Jupiter, MD) connected via stroke cart and assessing pt. [phone] Pt back to room. [facility] continuing assessment.

## 2022-11-23 NOTE — ED Notes (Signed)
Pt ambulated while monitoring pulse oximetry. O2 saturation maintained at 99% on room air. HR maintained at 75bpm. Pt denies any complaints or changes in condition.

## 2022-11-24 LAB — I-STAT CHEM 8, ED
BUN: 13 mg/dL (ref 8–23)
Calcium, Ion: 1.21 mmol/L (ref 1.15–1.40)
Chloride: 101 mmol/L (ref 98–111)
Creatinine, Ser: 1.1 mg/dL (ref 0.61–1.24)
Glucose, Bld: 115 mg/dL — ABNORMAL HIGH (ref 70–99)
HCT: 41 % (ref 39.0–52.0)
Hemoglobin: 13.9 g/dL (ref 13.0–17.0)
Potassium: 3.8 mmol/L (ref 3.5–5.1)
Sodium: 139 mmol/L (ref 135–145)
TCO2: 25 mmol/L (ref 22–32)

## 2022-12-31 ENCOUNTER — Encounter (HOSPITAL_BASED_OUTPATIENT_CLINIC_OR_DEPARTMENT_OTHER): Payer: Self-pay | Admitting: Pulmonary Disease

## 2022-12-31 ENCOUNTER — Ambulatory Visit (HOSPITAL_BASED_OUTPATIENT_CLINIC_OR_DEPARTMENT_OTHER): Payer: Medicare HMO | Admitting: Pulmonary Disease

## 2022-12-31 VITALS — BP 112/72 | HR 81 | Temp 98.0°F | Ht 72.0 in | Wt 234.0 lb

## 2022-12-31 DIAGNOSIS — J432 Centrilobular emphysema: Secondary | ICD-10-CM

## 2022-12-31 DIAGNOSIS — G4733 Obstructive sleep apnea (adult) (pediatric): Secondary | ICD-10-CM

## 2022-12-31 DIAGNOSIS — J410 Simple chronic bronchitis: Secondary | ICD-10-CM | POA: Diagnosis not present

## 2022-12-31 DIAGNOSIS — R634 Abnormal weight loss: Secondary | ICD-10-CM | POA: Diagnosis not present

## 2022-12-31 NOTE — Patient Instructions (Signed)
Will arrange for a home sleep study Will call to arrange for follow up after sleep study reviewed  

## 2022-12-31 NOTE — Progress Notes (Signed)
[facility]  Name: Cristian Moore MRN: [id number] DOB: 07/04/1940  Chief Complaint  Patient presents with   Follow-up    Patient wants to see if he can have his cpap changed to auto.     Summary: Cristian Moore is a 82 y.o. former smoker with obstructive sleep apnea, and chronic bronchitis with emphysema.  Subjective: His wife is a patient of mine.  He gets his CPAP through the [place] and recently got a new Resmed 10 CPAP.  He has lost about 60 lbs after his son passed away.  He has changed his diet, and doesn't eat much.    He was switched from symbicort to wixela that he gets from the [place].  Not having cough, wheeze, or sputum.  Past medical history: He  has a past medical history of Allergy, Arthritis, Asthma, Benign prostatic hypertrophy, Cataract, Chest pain, Chronic Dyspnea on exertion, Diabetes mellitus without complication (HCC) (2015), Diverticulitis, Diverticulosis, Exogenous obesity, GERD (gastroesophageal reflux disease), H/O blepharoplasty (07/27/2013), H/O hiatal hernia, Hernia, History of adenomatous polyp of colon (1991), Hyperlipidemia, Hypothyroidism (08/23/2019), Laceration (01/11/2009), Musculoskeletal pain, Obstructive sleep apnea, Rectal bleeding, Stroke (HCC), SUPRAVENTRICULAR TACHYCARDIA (08/01/2009), Thyroiditis, and Ulcer (2008).  Vital signs: BP 112/72 (BP Location: Right Arm, Patient Position: Sitting, Cuff Size: Normal)   Pulse 81   Temp 98 F (36.7 C) (Oral)   Ht 6' (1.829 m)   Wt 234 lb (106.1 kg)   SpO2 98%   BMI 31.74 kg/m   Physical exam:  General - alert ENT - no sinus tenderness, no stridor Cardiac - regular rate/rhythm, no murmur Chest - equal breath sounds b/l, no wheezing or rales Extremities - no cyanosis, clubbing, or edema Skin - no rashes Psych - normal mood and behavior  Studies: CT chest 12/11/16 >> mild emphysema, scarring in LLL and lingula Echo 02/12/21 >> EF 50 to 55%, grade 1 DD PFT 05/28/22 >> FEV1 3.37  (103%), FEV1% 89, TLC 7.22 (94%), DLCO 83%  Assessment/plan:  Obstructive sleep apnea. - gets CPAP supplies through the [place] - he has lost a significant amount of weight - will arrange for a home sleep study to determine if he still needs to use CPAP  Chronic bronchitis with emphysema. - continue wixela 250 one puff bid - prn albuterol   Patient Instructions  Will arrange for a home sleep study Will call to arrange for follow up after sleep study reviewed   Allergies as of 12/31/2022       Reactions   Penicillins Rash, Hives   Over 60 years ago Has patient had a PCN reaction causing immediate rash, facial/tongue/throat swelling, SOB or lightheadedness with hypotension: Unknown Has patient had a PCN reaction causing severe rash involving mucus membranes or skin necrosis: Unknown Has patient had a PCN reaction that required hospitalization: No Has patient had a PCN reaction occurring within the last 10 years: No If all of the above answers are "NO", then may proceed with Cephalosporin use.        Medication List        Accurate as of December 31, 2022 11:23 AM. If you have any questions, ask your nurse or doctor.          STOP taking these medications    budesonide-formoterol 160-4.5 MCG/ACT inhaler Commonly known as: Symbicort Stopped by: Coralyn Helling, MD       TAKE these medications    acetaminophen 500 MG tablet Commonly known as: TYLENOL Take 1,000 mg by mouth every  6 (six) hours as needed for moderate pain.   albuterol 108 (90 Base) MCG/ACT inhaler Commonly known as: VENTOLIN HFA Inhale 2 puffs into the lungs every 6 (six) hours as needed for wheezing or shortness of breath.   aspirin EC 81 MG tablet Take 81 mg by mouth daily. Swallow whole.   calcium carbonate 1250 (500 Ca) MG chewable tablet Commonly known as: OS-CAL Chew 2 tablets by mouth daily.   CENTRUM SILVER PO Take 1 tablet by mouth daily.   diclofenac Sodium 1 % Gel Commonly known as:  VOLTAREN Apply 2 g topically 4 (four) times daily as needed (shoulders/knees/back pain).   escitalopram 10 MG tablet Commonly known as: LEXAPRO Take 1 tablet (10 mg total) by mouth at bedtime.   famotidine 10 MG tablet Commonly known as: PEPCID Take 10 mg by mouth 2 (two) times daily as needed for heartburn or indigestion.   fluticasone 50 MCG/ACT nasal spray Commonly known as: FLONASE USE 2 SPRAYS IN EACH       NOSTRIL DAILY What changed: when to take this   hydroxypropyl methylcellulose / hypromellose 2.5 % ophthalmic solution Commonly known as: ISOPTO TEARS / GONIOVISC Place 1 drop into both eyes 4 (four) times daily as needed for dry eyes.   Krill Oil 350 MG Caps Take 350 mg by mouth daily.   metFORMIN 500 MG tablet Commonly known as: GLUCOPHAGE Take 1 tablet (500 mg total) by mouth daily with breakfast.   montelukast 10 MG tablet Commonly known as: SINGULAIR Take 1 tablet (10 mg total) by mouth at bedtime. Needs office visit for further refills   nitroGLYCERIN 0.4 MG SL tablet Commonly known as: Nitrostat DISSOLVE ONE TABLET UNDER THE TONGUE EVERY 5 MINUTES AS NEEDED FOR CHEST PAIN.  DO NOT EXCEED A TOTAL OF 3 DOSES IN 15 MINUTES   OneTouch Verio test strip Generic drug: glucose blood USE TO CHECK BLOOD SUGAR TWICE DAILY AND AS NEEDED   PRESCRIPTION MEDICATION Inhale 1 application into the lungs at bedtime. "CPAP" MACHINE   rosuvastatin 10 MG tablet Commonly known as: Crestor Take 1 tablet (10 mg total) by mouth daily.   sucralfate 1 g tablet Commonly known as: CARAFATE Take 1 tablet (1 g total) by mouth daily. Needed for stomach coating.  Needs office visit for further refills What changed:  when to take this reasons to take this additional instructions   tamsulosin 0.4 MG Caps capsule Commonly known as: FLOMAX Take 2 capsules (0.8 mg total) by mouth daily after supper. What changed: how much to take   traMADol 50 MG tablet Commonly known as:  ULTRAM Take 50 mg by mouth every 6 (six) hours as needed.   Vitamin D 50 MCG (2000 UT) tablet Take 2,000 Units by mouth daily.   Wixela Inhub 250-50 MCG/ACT Aepb Generic drug: fluticasone-salmeterol Inhale 1 puff into the lungs in the morning and at bedtime.   zolpidem 10 MG tablet Commonly known as: AMBIEN TAKE ONE TABLET AT BEDTIME AS NEEDED FOR SLEEP What changed: See the new instructions.        Time spent: 35 minutes  Signature: Coralyn Helling, MD [facility] Pulmonary/Critical Care Pager - [phone] 12/31/2022, 11:30 AM

## 2023-01-13 ENCOUNTER — Telehealth (HOSPITAL_BASED_OUTPATIENT_CLINIC_OR_DEPARTMENT_OTHER): Payer: Self-pay | Admitting: Pulmonary Disease

## 2023-01-13 NOTE — Telephone Encounter (Signed)
There is not a order for a sleep study been ordered

## 2023-01-13 NOTE — Telephone Encounter (Signed)
Patient states has not heard from someone to set up his HST. Did not see in the active requests whether this was sent to [facility] or he will being coming in office to receive our HST machine. Informed patient there is a wait for HST and takes a few weeks before he hears from someone.  Please advise.

## 2023-01-17 ENCOUNTER — Other Ambulatory Visit: Payer: Self-pay

## 2023-01-17 DIAGNOSIS — G4733 Obstructive sleep apnea (adult) (pediatric): Secondary | ICD-10-CM

## 2023-01-17 NOTE — Telephone Encounter (Signed)
Order for Hst has been placed

## 2023-01-17 NOTE — Telephone Encounter (Signed)
The HST order has been placed in [facility]

## 2023-01-22 ENCOUNTER — Telehealth: Payer: Self-pay

## 2023-01-22 NOTE — Telephone Encounter (Signed)
Sadly, I cannot accommodate new pts right now but glad to supervise/ review his chart if he decides to establish with one of my NPs

## 2023-01-22 NOTE — Telephone Encounter (Signed)
Spoke to patient's wife - patietnt is a former patient of Dr. Darlyn Read. Patient is currently a patient of Daleen Squibb, NP. He would like to come back to our office - if possible he wants to get in with Dr. Nadine Counts or Dr. Louanne Skye. I explained to the patient that at this time they are not taking new patients and told her we had several new providers that are accepting new patients - she requested that I check with Nadine Counts and [place] first.

## 2023-01-23 NOTE — Telephone Encounter (Signed)
Spoke with pt wife he was with her at the time and she made him aware of this,pt wife stated he will stay with his current PCP Gilman Schmidt

## 2023-01-23 NOTE — Telephone Encounter (Signed)
Unfortunately with my current outage I am going to be backed up for a little while so I do not know that I can take him on currently.

## 2023-01-31 ENCOUNTER — Telehealth: Payer: Self-pay | Admitting: Pulmonary Disease

## 2023-02-04 NOTE — Telephone Encounter (Signed)
He is not supposed to wear CPAP during the home sleep study.

## 2023-02-04 NOTE — Telephone Encounter (Signed)
Called and spoke with patient. He verbalized understanding. Confirmed that he completed the sleep study without his cpap machine. He will send the device back today to [facility].   Nothing further needed at time of call.

## 2023-02-20 ENCOUNTER — Encounter (INDEPENDENT_AMBULATORY_CARE_PROVIDER_SITE_OTHER): Payer: Medicare HMO

## 2023-02-20 ENCOUNTER — Telehealth: Payer: Self-pay | Admitting: Pulmonary Disease

## 2023-02-20 DIAGNOSIS — G4733 Obstructive sleep apnea (adult) (pediatric): Secondary | ICD-10-CM

## 2023-02-20 NOTE — Telephone Encounter (Signed)
HST 02/02/23 >> AHI 45, SpO2 low 81%.   Please inform him that his sleep study shows severe obstructive sleep apnea.  He should continue using CPAP while asleep.

## 2023-02-25 NOTE — Telephone Encounter (Signed)
Dr. Craige Cotta- I read PT results to PT. NFN

## 2023-04-17 DIAGNOSIS — I2699 Other pulmonary embolism without acute cor pulmonale: Secondary | ICD-10-CM | POA: Insufficient documentation

## 2023-07-02 DIAGNOSIS — I2699 Other pulmonary embolism without acute cor pulmonale: Secondary | ICD-10-CM

## 2023-07-02 HISTORY — DX: Other pulmonary embolism without acute cor pulmonale: I26.99

## 2023-09-22 NOTE — Progress Notes (Unsigned)
 Cardiology Office Note:   Date:  09/25/2023  ID:  Cristian Moore, DOB June 01, 1941, MRN [id number] PCP: Rebekah Chesterfield, NP  [facility] Providers Cardiologist:  [name], MD {  History of Present Illness:   Cristian Moore is a 83 y.o. male who presents for follow up of a  history of DM, nonobstructive CAD by cath 2014. In 2017 he  had a [employer] which demonstrated an EF of 48% with a defect present in the basal inferior and mid inferior location. This is most likely due to artifact but  inferior ischemia could not be ruled out.  In August 2022 he was in the hospital with a CVA.   Echo demonstrated a low normal EF of 50 - 55%.  There were no significant valvular abnormalities.  He was treated with Plavix and aspirin.  He had progressive SOB.  I sent him for cath and he had only 30% proximal LAD stenosis.    He was in the emergency room in May 2024 with near syncope.  I reviewed these records.  A code stroke was called.  There were no acute findings on the head CT.  He did have a high-grade right proximal PCA stenosis progressed from 2022 and extensive atherosclerosis in the carotids.  However, there were no acute lesions requiring management.  He was not hospitalized.  He did not require hospitalization.    He presents for follow up.  He had a lot of unexplained weight loss and is going to get a colonoscopy.  He reports having a CT prior to this at the [place] and being incidentally found to have pulmonary emboli.  He is now on anticoagulation.  He was not complaining of new shortness of breath.  He was not having any chest pain.  He has yet to have a colonoscopy.  Has been on the blood present for about 3 months.  He wonders if his weight loss could be related to depression following a family tragedy a few years ago.  He is somewhat limited in his activities because of bilateral knee pain likely needs injections.  He is going to see his orthopedist soon.    ROS: As stated in  the HPI and negative for all other systems.  Studies Reviewed:    EKG:   EKG Interpretation Date/Time:  Wednesday September 24 2023 15:36:26 EDT Ventricular Rate:  70 PR Interval:  192 QRS Duration:  86 QT Interval:  402 QTC Calculation: 434 R Axis:   45  Text Interpretation: Normal sinus rhythm When compared with ECG of 23-Nov-2022 08:30, No significant change since last tracing Confirmed by [name] ([id number]) on 09/24/2023 3:52:07 PM    Risk Assessment/Calculations:              Physical Exam:   VS:  BP 110/60   Pulse 70   Ht 6' (1.829 m)   Wt 236 lb (107 kg)   BMI 32.01 kg/m    Wt Readings from Last 3 Encounters:  09/24/23 236 lb (107 kg)  12/31/22 234 lb (106.1 kg)  11/23/22 180 lb 12.4 oz (82 kg)     GEN: Well nourished, well developed in no acute distress NECK: No JVD; No carotid bruits CARDIAC: RRR, no murmurs, rubs, gallops RESPIRATORY:  Clear to auscultation without rales, wheezing or rhonchi  ABDOMEN: Soft, non-tender, non-distended EXTREMITIES:  No edema; No deformity   ASSESSMENT AND PLAN:   CAD:   He had mild nonobstructive coronary disease.  No  change in therapy.   SOB:   This is actually not as significant as it was.  He had a cath and PFTs and no further cardiac workup was planned.    HTN:   The blood pressure is at target.  No change in therapy.   CVA: He has no residual related to this.    SLEEP APNEA:   He uses his CPAP.  No change in therapy.   PE:   He is tolerating anticoagulation.  We talked about when he would be able to have his colon anoscopy.   Follow up with me in 1 year.  Signed, [name], MD

## 2023-09-24 ENCOUNTER — Ambulatory Visit (INDEPENDENT_AMBULATORY_CARE_PROVIDER_SITE_OTHER): Payer: Non-veteran care | Admitting: Cardiology

## 2023-09-24 VITALS — BP 110/60 | HR 70 | Ht 72.0 in | Wt 236.0 lb

## 2023-09-24 DIAGNOSIS — R0602 Shortness of breath: Secondary | ICD-10-CM | POA: Diagnosis not present

## 2023-09-24 DIAGNOSIS — I2510 Atherosclerotic heart disease of native coronary artery without angina pectoris: Secondary | ICD-10-CM

## 2023-09-24 DIAGNOSIS — G4733 Obstructive sleep apnea (adult) (pediatric): Secondary | ICD-10-CM | POA: Diagnosis not present

## 2023-09-24 DIAGNOSIS — E785 Hyperlipidemia, unspecified: Secondary | ICD-10-CM | POA: Diagnosis not present

## 2023-09-24 NOTE — Patient Instructions (Signed)

## 2023-09-25 ENCOUNTER — Encounter: Payer: Self-pay | Admitting: Cardiology

## 2023-09-29 ENCOUNTER — Ambulatory Visit (INDEPENDENT_AMBULATORY_CARE_PROVIDER_SITE_OTHER): Payer: Non-veteran care | Admitting: Internal Medicine

## 2023-09-29 ENCOUNTER — Encounter: Payer: Self-pay | Admitting: Internal Medicine

## 2023-09-29 VITALS — BP 124/60 | HR 80 | Ht 72.0 in | Wt 234.4 lb

## 2023-09-29 DIAGNOSIS — F439 Reaction to severe stress, unspecified: Secondary | ICD-10-CM

## 2023-09-29 DIAGNOSIS — R634 Abnormal weight loss: Secondary | ICD-10-CM

## 2023-09-29 DIAGNOSIS — R6881 Early satiety: Secondary | ICD-10-CM

## 2023-09-29 DIAGNOSIS — Z7901 Long term (current) use of anticoagulants: Secondary | ICD-10-CM

## 2023-09-29 DIAGNOSIS — R630 Anorexia: Secondary | ICD-10-CM | POA: Diagnosis not present

## 2023-09-29 DIAGNOSIS — K582 Mixed irritable bowel syndrome: Secondary | ICD-10-CM

## 2023-09-29 NOTE — Patient Instructions (Signed)
 We will obtain clearance for you to hold Eliquis from the [place] and then we will set you up for an EGD.  Take a tablespoon of Benefiber daily if that doesn't help regulate your bowel movements then double it. Use Miralax for constipation.   _______________________________________________________  If your blood pressure at your visit was 140/90 or greater, please contact your primary care physician to follow up on this.  _______________________________________________________  If you are age 68 or older, your body mass index should be between 23-30. Your Body mass index is 31.79 kg/m. If this is out of the aforementioned range listed, please consider follow up with your Primary Care Provider.  If you are age 9 or younger, your body mass index should be between 19-25. Your Body mass index is 31.79 kg/m. If this is out of the aformentioned range listed, please consider follow up with your Primary Care Provider.   ________________________________________________________  The [facility] providers would like to encourage you to use [facility] to communicate with providers for non-urgent requests or questions.  Due to long hold times on the telephone, sending your provider a message by [facility] may be a faster and more efficient way to get a response.  Please allow 48 business hours for a response.  Please remember that this is for non-urgent requests.  _______________________________________________________  I appreciate the opportunity to care for you. Stan Head, MD, [facility]

## 2023-09-29 NOTE — Progress Notes (Signed)
 Cristian Moore 83 y.o. Jun 01, 1941 [id number]  Assessment & Plan:   Encounter Diagnoses  Name Primary?   Loss of weight Yes   Early satiety    Loss of appetite    Long term current use of anticoagulant    Situational stress    Irritable bowel syndrome with both constipation and diarrhea    His weight loss has leveled off.  Timing is coincident with the tragic death of his son at the hands of his grandson.  EGD seems reasonable to rule out upper GI problem.  He is up-to-date with colonoscopy and I do not think his signs or symptoms represent colon cancer.  He has chronically alternating bowel habits consistent with IBS mixed pattern.   He has a relatively recent diagnosis of pulmonary embolism at the beginning of this year he tells me.  It is not clear to me he could hold his anticoagulation for a procedure.  We need to clarify that with the prescriber so we will communicate with his [facility] healthcare provider to find out when it is acceptable to hold his apixaban 2 days prior to an EGD.  Could potentially perform on apixaban but would prefer not.  I do not think a colonoscopy is needed at this time. Fiber 1 tbsp every day no Miralax unless if bad constipation Double fiber dose if not   CC: Rebekah Chesterfield, NP [facility]  Subjective:  Patient consented to the use of artificial intelligence scribe application Chief Complaint: Weight loss  HPI  83 year old white man with history of sleep apnea, hypothyroidism, colon polyps, diabetes, obesity here for evaluation of weight loss at the request of [facility] primary [facility].  His wife is present and participates. He has experienced an unintentional weight loss of approximately 50 pounds over the past year, with his weight stabilizing since July of last year at 234 pounds. Previously, his weight ranged from 262 to 274 pounds. He denies significant changes in eating or swallowing but notes a decreased appetite and early  satiety. He attributes some of the weight loss to emotional distress following a family tragedy a year and a half ago, where his grandson killed his son.  He has a history of diverticulitis and had laparoscopic segmental sigmoid resection in the past. A colonoscopy was performed nearly four years ago. His bowel habits have been stable, alternating between diarrhea and constipation. He uses fiber supplements like psyllium and occasionally Miralax to manage these symptoms, noting that frequent use of Miralax results in diarrhea.  He is currently on a blood thinner prescribed by the [place] following a pulmonary embolism, which he has been taking for at least three months. He also takes Lexapro, prescribed by Doctor Stacks, and continues to take it regularly.  No difficulty swallowing but experiences decreased appetite and early satiety. No recent changes in bowel habits, noting he has been stable with alternating diarrhea and constipation.  He also receives care at a clinic in [facility]. Wt Readings from Last 3 Encounters:  09/29/23 234 lb 6.4 oz (106.3 kg)  09/24/23 236 lb (107 kg)  12/31/22 234 lb (106.1 kg)   At the [place] weight in February 2025 238 pounds It was 230 pounds July 09, 2023 236 pounds in August 2024  July 2023 262 pounds and March 2023 274 pounds  Labs October 2024 at the [place] normal iron studies with ferritin 62, thyroid studies normal,  A1C 5.9 08/28/23 CBC NL 08/28/23 CMET NL also  Colonoscopy in  December 2021 demonstrated a diminutive adenoma, some remaining residual diverticulosis, rectosigmoid anastomosis and internal hemorrhoids.       Allergies  Allergen Reactions   Penicillins Rash and Hives    Over 60 years ago Has patient had a PCN reaction causing immediate rash, facial/tongue/throat swelling, SOB or lightheadedness with hypotension: Unknown Has patient had a PCN reaction causing severe rash involving mucus membranes or skin necrosis: Unknown Has patient had a  PCN reaction that required hospitalization: No Has patient had a PCN reaction occurring within the last 10 years: No If all of the above answers are "NO", then may proceed with Cephalosporin use.    Current Meds  Medication Sig   acetaminophen (TYLENOL) 500 MG tablet Take 1,000 mg by mouth every 6 (six) hours as needed for moderate pain.   albuterol (VENTOLIN HFA) 108 (90 Base) MCG/ACT inhaler Inhale 2 puffs into the lungs every 6 (six) hours as needed for wheezing or shortness of breath.   Apixaban (ELIQUIS PO) Take by mouth 2 (two) times daily.   calcium carbonate (OS-CAL) 1250 (500 Ca) MG chewable tablet Chew 2 tablets by mouth daily.   Cholecalciferol (VITAMIN D) 50 MCG (2000 UT) tablet Take 2,000 Units by mouth daily.   diclofenac Sodium (VOLTAREN) 1 % GEL Apply 2 g topically 4 (four) times daily as needed (shoulders/knees/back pain).   escitalopram (LEXAPRO) 10 MG tablet Take 1 tablet (10 mg total) by mouth at bedtime.   famotidine (PEPCID) 10 MG tablet Take 10 mg by mouth 2 (two) times daily as needed for heartburn or indigestion.   fluticasone (FLONASE) 50 MCG/ACT nasal spray USE 2 SPRAYS IN EACH       NOSTRIL DAILY (Patient taking differently: Place 2 sprays into both nostrils at bedtime.)   glucose blood (ONETOUCH VERIO) test strip USE TO CHECK BLOOD SUGAR TWICE DAILY AND AS NEEDED   hydroxypropyl methylcellulose / hypromellose (ISOPTO TEARS / GONIOVISC) 2.5 % ophthalmic solution Place 1 drop into both eyes 4 (four) times daily as needed for dry eyes.   Krill Oil 350 MG CAPS Take 350 mg by mouth daily.   metFORMIN (GLUCOPHAGE) 500 MG tablet Take 1 tablet (500 mg total) by mouth daily with breakfast.   montelukast (SINGULAIR) 10 MG tablet Take 1 tablet (10 mg total) by mouth at bedtime. Needs office visit for further refills   Multiple Vitamins-Minerals (CENTRUM SILVER PO) Take 1 tablet by mouth daily.   PRESCRIPTION MEDICATION Inhale 1 application into the lungs at bedtime. "CPAP"  MACHINE   rosuvastatin (CRESTOR) 10 MG tablet Take 1 tablet (10 mg total) by mouth daily.   sucralfate (CARAFATE) 1 g tablet Take 1 tablet (1 g total) by mouth daily. Needed for stomach coating.  Needs office visit for further refills (Patient taking differently: Take 1 g by mouth daily as needed (Needed for stomach coating.). Needs office visit for further refills)   tamsulosin (FLOMAX) 0.4 MG CAPS capsule Take 2 capsules (0.8 mg total) by mouth daily after supper. (Patient taking differently: Take 0.4 mg by mouth daily after supper.)   traMADol (ULTRAM) 50 MG tablet Take 50 mg by mouth every 6 (six) hours as needed.   zolpidem (AMBIEN) 10 MG tablet TAKE ONE TABLET AT BEDTIME AS NEEDED FOR SLEEP (Patient taking differently: Take 10 mg by mouth at bedtime.)   Current Facility-Administered Medications for the 09/29/23 encounter (Office Visit) with Iva Boop, MD  Medication   sodium chloride flush (NS) 0.9 % injection 3 mL  Past Medical History:  Diagnosis Date   Allergy    Arthritis    Asthma    Benign prostatic hypertrophy    Cataract    mild   Chest pain    a. 01/2013 Cath: LM nl, LAD 25p, D1 25p, LCX nl, RCA 25p, PDA nl, RPL nl, EF 55%;  b. 03/2015 MV: EF 59%, no ischemia; c. 03/2016 MV: basal inf/mid inf defect, likely artifact, EF 48%, low risk.   Chronic Dyspnea on exertion    Diabetes mellitus without complication (HCC) 2015   Diverticulitis    Diverticulosis    Exogenous obesity    GERD (gastroesophageal reflux disease)    moe like heart burn   H/O blepharoplasty 07/27/2013   H/O hiatal hernia    Hernia    Umbulical    History of adenomatous polyp of colon 1991   Hyperlipidemia    Hypothyroidism 08/23/2019   Laceration 01/11/2009   Deep laceration left index finger dorsally   Musculoskeletal pain    in the right shoulder - S/P ROTATOR CUFF REPAIR-BUT STILL HAS PAIN   Obstructive sleep apnea     CPAP    AUTO SET 6 TO 20 - USUALLY SETTLES OUT AT 10   Rectal bleeding     PT ATTRIBUTES TO HEMORRHOIDS   Stroke (HCC)    SUPRAVENTRICULAR TACHYCARDIA 08/01/2009   Qualifier: Diagnosis of  By: Cristela Felt, CNA, Christy     Thyroiditis    Ulcer 2008   Past Surgical History:  Procedure Laterality Date   COLON SURGERY  07/2012   COLONOSCOPY     for polyps   ELBOW BURSA SURGERY     Eyelid Surgery Bilateral 05/2013   INGUINAL HERNIA REPAIR     KIDNEY STONE SURGERY     KNEE SURGERY     meniscus right knee   LAPAROSCOPIC SIGMOID COLECTOMY  07/23/2012   Procedure: LAPAROSCOPIC SIGMOID COLECTOMY;  Surgeon: Ardeth Sportsman, MD;  Location: [facility];  Service: General;  Laterality: N/A;  Laparoscopic Sigmoid Colectomy   LEFT HEART CATHETERIZATION WITH CORONARY ANGIOGRAM N/A 02/22/2013   Procedure: LEFT HEART CATHETERIZATION WITH CORONARY ANGIOGRAM;  Surgeon: [name], MD;  Location: [facility] CATH LAB;  Service: Cardiovascular;  Laterality: N/A;   PROCTOSCOPY  07/23/2012   Procedure: PROCTOSCOPY;  Surgeon: Ardeth Sportsman, MD;  Location: [facility];  Service: General;  Laterality: N/A;  Rigid Proctoscopy   RIGHT/LEFT HEART CATH AND CORONARY ANGIOGRAPHY N/A 06/03/2022   Procedure: RIGHT/LEFT HEART CATH AND CORONARY ANGIOGRAPHY;  Surgeon: Yvonne Kendall, MD;  Location: [facility] INVASIVE CV LAB;  Service: Cardiovascular;  Laterality: N/A;   ROTATOR CUFF REPAIR     TONSILLECTOMY     TOTAL HIP ARTHROPLASTY Right 05/27/2018   Procedure: RIGHT TOTAL HIP ARTHROPLASTY ANTERIOR APPROACH;  Surgeon: Ollen Gross, MD;  Location: [facility];  Service: Orthopedics;  Laterality: Right;    UMBILICAL HERNIA REPAIR  07/23/2012   Procedure: HERNIA REPAIR UMBILICAL ADULT;  Surgeon: Ardeth Sportsman, MD;  Location: [facility];  Service: General;  Laterality: N/A;  Primary Umbilical Hernia Repair   Social History   Social History Narrative   Lives at home with wife      [facility] Benefits      [facility] (09/16/16):   Originally from [place] [place] [facility]. Previously served & lived in [place], [place], & [place]. 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 He also worked in [employer].  No pets currently. Remote bird exposure. No mold or hot tub exposure. Enjoys playing banjo as well as hunting. He also raises cattle. Has carpet in his bedroom. No feather bedding. No indoor plants.    family history includes Diverticulitis in his daughter and son; Heart disease in his mother; Lung cancer in his maternal uncle; Stroke in his mother.   Review of Systems As per HPI otherwise negative.  Objective:   Physical Exam @BP  124/60 (Cuff Size: Normal)   Pulse 80   Ht 6' (1.829 m)   Wt 234 lb 6.4 oz (106.3 kg)   BMI 31.79 kg/m @  General:  Well-developed, well-nourished and in no acute distress Eyes:  anicteric. Lungs: Clear to auscultation bilaterally. Heart:  S1S2, no rubs, murmurs, gallops. Abdomen:  soft, non-tender, no hepatosplenomegaly, hernia, or mass and BS+.  Lymph:  no cervical or supraclavicular adenopathy. Extremities:   Some non-pitting LE edema edema, cyanosis or clubbing Skin   no rash. Neuro:  A&O x 3.  Psych:  appropriate mood and  Affect.   Data Reviewed: See HPI

## 2023-10-02 ENCOUNTER — Telehealth: Payer: Self-pay

## 2023-10-02 DIAGNOSIS — R634 Abnormal weight loss: Secondary | ICD-10-CM

## 2023-10-02 DIAGNOSIS — R6881 Early satiety: Secondary | ICD-10-CM

## 2023-10-02 DIAGNOSIS — R630 Anorexia: Secondary | ICD-10-CM

## 2023-10-02 NOTE — Telephone Encounter (Signed)
 After being on hold for 30 minutes I spoke with Monique. She gave me the numbers to call and fax the [facility]. Phone # [phone], fax # [phone] to try and get Cristian Moore clearance so we can set up his EGD procedure.

## 2023-10-03 NOTE — Telephone Encounter (Signed)
 I faxed the Eliquis clearance letter yesterday 10/02/2023 to the [place] in [place] and I got confirmation it went thru. Hopefully we will get a response soon.

## 2023-10-10 NOTE — Telephone Encounter (Signed)
 I called Cristian Moore and he stated he saw Dr Joseph Art this past Wednesday and he was told he could hold his Eliquis 2 days prior to his EGD. I set him up for 10/24/2023 at 3:00pm and confirmed his address. I will mail him instructions.  His  wife wrote down our fax #'s. They will try to get the [facility] to fax in writing about the Eliquis.   I called the [place] #[phone] and spoke with April, RN she took the information down and she is going to pass it on to the [facility] team so hopefully soon we will get the Eliquis clearance in writing.

## 2023-10-10 NOTE — Telephone Encounter (Signed)
 I spoke with Clydie Braun at the [facility], phone # [phone]. She is going to send a message to the [facility]- Dr Sondra Come to get them to check on the status of Cristian Moore 's Eliquis clearance. I confirmed the fax I sent it to and she said that is correct. Fax # [phone]. His referral runs out 11/02/2023 so we were trying to get this approved and set up his EGD before then.

## 2023-10-10 NOTE — Telephone Encounter (Signed)
 Patient returning call. Please advise. Thank you.

## 2023-10-10 NOTE — Telephone Encounter (Signed)
 I left him a detailed message on his home # [phone] to update him on what is going on with his Eliquis clearance.

## 2023-10-13 NOTE — Telephone Encounter (Signed)
 Patient was told Verbally to hold Eliquis 2 days by the doctor at the [place] but I told him we have to have a note in writing signed by his prescriber. Same as PJ already told him

## 2023-10-13 NOTE — Telephone Encounter (Signed)
 Inbound call from patient, following up on call below, wishing to know if anticoag clearance paperwork has been received in writing from [place].

## 2023-10-13 NOTE — Telephone Encounter (Signed)
 Called patient to inform him I have not seen a letter from the [place] yet. I looked in Dr Conni Deis boxes and fax machines. I will check again in the morning. I told him if I see it that I would give him a call. If not, then he said he will call the [facility] back.  Its Att. To PJ on the cover he said.

## 2023-10-14 NOTE — Telephone Encounter (Signed)
 We received the documentation from Dr Kinnie Penton that it is ok to hold Eliquis 2 days before procedure and start back the day after. Letter will be on my desk in  Anti-Coag folder  until it can be scanned in

## 2023-10-14 NOTE — Telephone Encounter (Signed)
 Inbound call from patient stating he has called the [place] a few times for BT clearance to be sent. Requesting a call to discuss if it was received. Please advise, thank you.

## 2023-10-24 ENCOUNTER — Ambulatory Visit: Admitting: Internal Medicine

## 2023-10-24 ENCOUNTER — Encounter: Payer: Self-pay | Admitting: Internal Medicine

## 2023-10-24 VITALS — BP 105/51 | HR 53 | Temp 97.9°F | Resp 9 | Ht 72.0 in | Wt 234.0 lb

## 2023-10-24 DIAGNOSIS — R6881 Early satiety: Secondary | ICD-10-CM

## 2023-10-24 DIAGNOSIS — R634 Abnormal weight loss: Secondary | ICD-10-CM

## 2023-10-24 DIAGNOSIS — R630 Anorexia: Secondary | ICD-10-CM

## 2023-10-24 DIAGNOSIS — K2950 Unspecified chronic gastritis without bleeding: Secondary | ICD-10-CM

## 2023-10-24 DIAGNOSIS — K319 Disease of stomach and duodenum, unspecified: Secondary | ICD-10-CM | POA: Diagnosis not present

## 2023-10-24 DIAGNOSIS — K3189 Other diseases of stomach and duodenum: Secondary | ICD-10-CM

## 2023-10-24 MED ORDER — SODIUM CHLORIDE 0.9 % IV SOLN
500.0000 mL | Freq: Once | INTRAVENOUS | Status: DC
Start: 2023-10-24 — End: 2023-10-24

## 2023-10-24 NOTE — Progress Notes (Signed)
 History and Physical Interval Note:  10/24/2023 2:03 PM  Cristian Moore Cristian Moore  has presented today for endoscopic procedure(s), with the diagnosis of  Encounter Diagnoses  Name Primary?   Loss of weight Yes   Early satiety    Loss of appetite   .  The various methods of evaluation and treatment have been discussed with the patient and/or family. After consideration of risks, benefits and other options for treatment, the patient has consented to  the endoscopic procedure(s).  Wt Readings from Last 3 Encounters:  10/24/23 234 lb (106.1 kg)  09/29/23 234 lb 6.4 oz (106.3 kg)  09/24/23 236 lb (107 kg)      The patient's history has been reviewed, patient examined, no change in status, stable for endoscopic procedure(s).  I have reviewed the patient's chart and labs.  Questions were answered to the patient's satisfaction.     Kenney Peacemaker, MD, [facility]

## 2023-10-24 NOTE — Progress Notes (Signed)
 Vss nad trans to pacu

## 2023-10-24 NOTE — Patient Instructions (Addendum)
 No signs of cancer or ulcers. Sometimes a stomach infection called H pylori can be involved so I took biopsies to check for that.  I appreciate the opportunity to care for you. Kenney Peacemaker, MD, FACG   YOU HAD AN ENDOSCOPIC PROCEDURE TODAY AT THE [facility]:   Refer to the procedure report that was given to you for any specific questions about what was found during the examination.  If the procedure report does not answer your questions, please call your gastroenterologist to clarify.  If you requested that your care partner not be given the details of your procedure findings, then the procedure report has been included in a sealed envelope for you to review at your convenience later.  YOU SHOULD EXPECT: Some feelings of bloating in the abdomen. Passage of more gas than usual.  Walking can help get rid of the air that was put into your GI tract during the procedure and reduce the bloating. If you had a lower endoscopy (such as a colonoscopy or flexible sigmoidoscopy) you may notice spotting of blood in your stool or on the toilet paper. If you underwent a bowel prep for your procedure, you may not have a normal bowel movement for a few days.  Please Note:  You might notice some irritation and congestion in your nose or some drainage.  This is from the oxygen used during your procedure.  There is no need for concern and it should clear up in a day or so.  SYMPTOMS TO REPORT IMMEDIATELY:  Following upper endoscopy (EGD)  Vomiting of blood or coffee ground material  New chest pain or pain under the shoulder blades  Painful or persistently difficult swallowing  New shortness of breath  Fever of 100F or higher  Black, tarry-looking stools  For urgent or emergent issues, a gastroenterologist can be reached at any hour by calling [phone]. Do not use MyChart messaging for urgent concerns.    DIET:  We do recommend a small meal at first, but then you may proceed to your  regular diet.  Drink plenty of fluids but you should avoid alcoholic beverages for 24 hours.  ACTIVITY:  You should plan to take it easy for the rest of today and you should NOT DRIVE or use heavy machinery until tomorrow (because of the sedation medicines used during the test).    FOLLOW UP: Our staff will call the number listed on your records the next business day following your procedure.  We will call around 7:15- 8:00 am to check on you and address any questions or concerns that you may have regarding the information given to you following your procedure. If we do not reach you, we will leave a message.     If any biopsies were taken you will be contacted by phone or by letter within the next 1-3 weeks.  Please call us  at [phone] if you have not heard about the biopsies in 3 weeks.    SIGNATURES/CONFIDENTIALITY: You and/or your care partner have signed paperwork which will be entered into your electronic medical record.  These signatures attest to the fact that that the information above on your After Visit Summary has been reviewed and is understood.  Full responsibility of the confidentiality of this discharge information lies with you and/or your care-partner.

## 2023-10-24 NOTE — Progress Notes (Signed)
 Pt's states no medical or surgical changes since previsit or office visit.

## 2023-10-24 NOTE — Progress Notes (Signed)
 Called to room to assist during endoscopic procedure.  Patient ID and intended procedure confirmed with present staff. Received instructions for my participation in the procedure from the performing physician.

## 2023-10-24 NOTE — Op Note (Addendum)
 [facility] Patient Name: Cristian Moore Procedure Date: 10/24/2023 1:59 PM MRN: [id number] Endoscopist: Kenney Peacemaker , MD, [id number] Age: 83 Referring MD:  Date of Birth: Oct 05, 1940 Gender: Male Account #: [id number] Procedure:                Upper GI endoscopy Indications:              Anorexia, Early satiety, Weight loss Medicines:                Monitored Anesthesia Care Procedure:                Pre-Anesthesia Assessment:                           - Prior to the procedure, a History and Physical                            was performed, and patient medications and                            allergies were reviewed. The patient's tolerance of                            previous anesthesia was also reviewed. The risks                            and benefits of the procedure and the sedation                            options and risks were discussed with the patient.                            All questions were answered, and informed consent                            was obtained. Prior Anticoagulants: The patient                            last took Eliquis (apixaban) 2 days prior to the                            procedure. ASA Grade Assessment: III - A patient                            with severe systemic disease. After reviewing the                            risks and benefits, the patient was deemed in                            satisfactory condition to undergo the procedure.                           After obtaining informed consent, the endoscope was  passed under direct vision. Throughout the                            procedure, the patient's blood pressure, pulse, and                            oxygen saturations were monitored continuously. The                            Olympus Scope SN [id number] was introduced through the                            mouth, and advanced to the second part of duodenum.                             The upper GI endoscopy was accomplished without                            difficulty. The patient tolerated the procedure                            well. Scope In: Scope Out: Findings:                 Patchy mildly erythematous mucosa without bleeding                            was found in the gastric body and in the gastric                            antrum. Biopsies were taken with a cold forceps for                            histology. Verification of patient identification                            for the specimen was done. Estimated blood loss was                            minimal.                           The exam was otherwise without abnormality.                           The cardia and gastric fundus were normal on                            retroflexion. Complications:            No immediate complications. Estimated Blood Loss:     Estimated blood loss was minimal. Impression:               - Erythematous mucosa in the gastric body and  antrum. Biopsied.                           - The examination was otherwise normal. Recommendation:           - Patient has a contact number available for                            emergencies. The signs and symptoms of potential                            delayed complications were discussed with the                            patient. Return to normal activities tomorrow.                            Written discharge instructions were provided to the                            patient.                           - Resume previous diet.                           - Continue present medications.                           - Await pathology results.                           - Resume Eliquis (apixaban) at prior dose tomorrow. Kenney Peacemaker, MD 10/24/2023 2:22:17 PM This report has been signed electronically.

## 2023-10-27 ENCOUNTER — Telehealth: Payer: Self-pay

## 2023-10-27 NOTE — Telephone Encounter (Signed)
 Post procedure follow up call, no answer

## 2023-10-29 ENCOUNTER — Encounter: Payer: Self-pay | Admitting: Internal Medicine

## 2023-10-29 LAB — SURGICAL PATHOLOGY

## 2024-02-05 ENCOUNTER — Telehealth: Payer: Self-pay

## 2024-02-05 NOTE — Telephone Encounter (Signed)
   Pre-operative Risk Assessment    Patient Name: Cristian Moore  DOB: January 28, 1941 MRN: [id number]   Date of last office visit: 09/24/2023, Dr. Lynwood Schilling, MD Date of next office visit: NONE   Request for Surgical Clearance    Procedure:  left knee scope with meniscal debridement   Date of Surgery:  Clearance 03/09/24                                Surgeon: Dr. Dempsey Moan  Surgeon's Group or Practice Name: [facility] Phone number: [phone] Fax number: [phone]   Type of Clearance Requested:   - Medical  - Pharmacy:  Hold Apixaban (Eliquis)     Type of Anesthesia:  choice   Additional requests/questions:    Bonney Asberry KANDICE Ethelene   02/05/2024, 5:32 PM

## 2024-02-06 LAB — LAB REPORT - SCANNED: A1c: 5.8

## 2024-02-10 NOTE — Telephone Encounter (Signed)
 Patient with diagnosis of Afib on Eliquis for anticoagulation.    Procedure:  left knee scope with meniscal debridement  Date of procedure: 03/09/24   CHA2DS2-VASc Score = 7   This indicates a 11.2% annual risk of stroke. The patient's score is based upon: CHF History: 0 HTN History: 1 Diabetes History: 1 Stroke History: 2 Vascular Disease History: 1 Age Score: 2 Gender Score: 0     CrCl - need updated lab last lab in [facility] from 11/23/2022  Platelet count   Patient has not  had an Afib/aflutter ablation within the last 3 months or DCCV within the last 30 days    **This guidance is not considered finalized until pre-operative APP has relayed final recommendations.**

## 2024-02-12 ENCOUNTER — Other Ambulatory Visit: Payer: Self-pay

## 2024-02-12 DIAGNOSIS — Z01818 Encounter for other preprocedural examination: Secondary | ICD-10-CM

## 2024-02-12 NOTE — Telephone Encounter (Signed)
 Orders have been placed and released called patient to make him aware of him needing labs per clearance and TELEVISIT patient stated he just had labs done through the [facility] I made him aware we are not able to see them if he could get that to us  either through MyChart or fax patient and spouse tried to through MyChart during phone call but were not able to patient said not great with technology and it be easier to drop them off tomorrow I made patient aware of our new location and to drop lab results as soon as possible so we can see if what PharmD is needing for Eliquis hold was drawn if not he will need to get labs done he voiced understanding and will drop off tomorrow for preop team I went ahead and scheduled televisit for next week on Wednesday afternoon patient's procedure is scheduled for 9/9

## 2024-02-13 ENCOUNTER — Telehealth: Payer: Self-pay | Admitting: Cardiology

## 2024-02-13 NOTE — Telephone Encounter (Signed)
 Pt has tele preop appt 02/18/24.

## 2024-02-13 NOTE — Telephone Encounter (Signed)
 New message   Patient is scheduled for surgery soon and we told him labs are needed prior to surgical clearance appt.  Pt had labs drawn at the [facility] on 02-06-24 and has brought the lab results to us  to see if he still needs labs drawn.  He is in Zone A with lab work.

## 2024-02-13 NOTE — Telephone Encounter (Signed)
 Pt brought lab from [facility] into office CBC was done 02/06/24 and BMP is from April.  Pt is scheduled for surgery 03/09/24 and these labs will need to be update.  Front desk will make him aware to report to [facility] for blood draw.  Orders has previously been placed and released.  He will f/u for [facility] on 02/18/24 for surgical clearance as scheduled.

## 2024-02-14 LAB — CBC
Hematocrit: 44.1 % (ref 37.5–51.0)
Hemoglobin: 14.3 g/dL (ref 13.0–17.7)
MCH: 32.5 pg (ref 26.6–33.0)
MCHC: 32.4 g/dL (ref 31.5–35.7)
MCV: 100 fL — ABNORMAL HIGH (ref 79–97)
Platelets: 281 x10E3/uL (ref 150–450)
RBC: 4.4 x10E6/uL (ref 4.14–5.80)
RDW: 11.9 % (ref 11.6–15.4)
WBC: 6.8 x10E3/uL (ref 3.4–10.8)

## 2024-02-14 LAB — BASIC METABOLIC PANEL WITH GFR
BUN/Creatinine Ratio: 15 (ref 10–24)
BUN: 17 mg/dL (ref 8–27)
CO2: 23 mmol/L (ref 20–29)
Calcium: 10.1 mg/dL (ref 8.6–10.2)
Chloride: 100 mmol/L (ref 96–106)
Creatinine, Ser: 1.1 mg/dL (ref 0.76–1.27)
Glucose: 95 mg/dL (ref 70–99)
Potassium: 4.7 mmol/L (ref 3.5–5.2)
Sodium: 138 mmol/L (ref 134–144)
eGFR: 67 mL/min/1.73 (ref >59)

## 2024-02-18 ENCOUNTER — Ambulatory Visit: Attending: Cardiology | Admitting: Emergency Medicine

## 2024-02-18 DIAGNOSIS — Z01810 Encounter for preprocedural cardiovascular examination: Secondary | ICD-10-CM

## 2024-02-18 NOTE — Telephone Encounter (Signed)
 Called pt in error. Pt has tele appt today @ 2:20.

## 2024-02-18 NOTE — Progress Notes (Signed)
 Virtual Visit via Telephone Note   Because of Cristian Moore co-morbid illnesses, he is at least at moderate risk for complications without adequate follow up.  This format is felt to be most appropriate for this patient at this time.  Due to technical limitations with video connection Web designer), today's appointment will be conducted as an audio only telehealth visit, and Cristian Moore verbally agreed to proceed in this manner.   All issues noted in this document were discussed and addressed.  No physical exam could be performed with this format.  Evaluation Performed:  Preoperative cardiovascular risk assessment _____________   Date:  02/18/2024   Patient ID:  Cristian Moore, DOB September 18, 1940, MRN [id number] Patient Location:  Home Provider location:   Office  Primary Care Provider:  Renato Dorothey HERO, NP Primary Cardiologist:  Lynwood Schilling, MD  Chief Complaint / Patient Profile   83 y.o. y/o male with a h/o type 2 diabetes, nonobstructive CAD by cath in 2014, hypertension, CVA, sleep apnea, pulmonary embolism, T2DM who is pending left knee scope with meniscal debridement on 03/09/2024 with [facility] and presents today for telephonic preoperative cardiovascular risk assessment.  History of Present Illness    Cristian Moore is a 83 y.o. male who presents via audio/video conferencing for a telehealth visit today.  Pt was last seen in cardiology clinic on 09/24/2023 by Dr. Schilling.  At that time CREIG LANDIN was doing well.  The patient is now pending procedure as outlined above. Since his last visit, he denies chest pain, lower extremity edema, fatigue, palpitations, melena, hematuria, hemoptysis, diaphoresis, weakness, presyncope, syncope, orthopnea, and PND.  Today patient is doing well overall.  He is without acute cardiovascular concerns or complaints at this time.  He denies any chest pains or anginal symptoms.  He stays relatively active mowing the yard, going for  walks, and going to church without any exertional symptoms.  No further ischemic testing is warranted at this time.  Overall he can complete greater than 4 METS.  Past Medical History    Past Medical History:  Diagnosis Date   Allergy     Arthritis    Asthma    Benign prostatic hypertrophy    Cataract    mild   Chest pain    a. 01/2013 Cath: LM nl, LAD 25p, D1 25p, LCX nl, RCA 25p, PDA nl, RPL nl, EF 55%;  b. 03/2015 MV: EF 59%, no ischemia; c. 03/2016 MV: basal inf/mid inf defect, likely artifact, EF 48%, low risk.   Chronic Dyspnea on exertion    Diabetes mellitus without complication (HCC) 2015   Diverticulitis    Diverticulosis    Exogenous obesity    GERD (gastroesophageal reflux disease)    moe like heart burn   H/O blepharoplasty 07/27/2013   H/O hiatal hernia    Hernia    Umbulical    History of adenomatous polyp of colon 1991   Hyperlipidemia    Hypothyroidism 08/23/2019   Laceration 01/11/2009   Deep laceration left index finger dorsally   Musculoskeletal pain    in the right shoulder - S/P ROTATOR CUFF REPAIR-BUT STILL HAS PAIN   Obstructive sleep apnea     CPAP    AUTO SET 6 TO 20 - USUALLY SETTLES OUT AT 10   Pulmonary embolism (HCC) 2025   started on Eliquis   Rectal bleeding    PT ATTRIBUTES TO HEMORRHOIDS   Stroke (HCC)    SUPRAVENTRICULAR TACHYCARDIA 08/01/2009   Qualifier:  Diagnosis of  By: Leana, CNA, Christy     Thyroiditis    Ulcer 2008   Past Surgical History:  Procedure Laterality Date   COLON SURGERY  07/2012   COLONOSCOPY     for polyps   ELBOW BURSA SURGERY     Eyelid Surgery Bilateral 05/2013   INGUINAL HERNIA REPAIR     KIDNEY STONE SURGERY     KNEE SURGERY     meniscus right knee   LAPAROSCOPIC SIGMOID COLECTOMY  07/23/2012   Procedure: LAPAROSCOPIC SIGMOID COLECTOMY;  Surgeon: Elspeth KYM Schultze, MD;  Location: [facility];  Service: General;  Laterality: N/A;  Laparoscopic Sigmoid Colectomy   LEFT HEART CATHETERIZATION WITH CORONARY  ANGIOGRAM N/A 02/22/2013   Procedure: LEFT HEART CATHETERIZATION WITH CORONARY ANGIOGRAM;  Surgeon: Lynwood Schilling, MD;  Location: [facility] CATH LAB;  Service: Cardiovascular;  Laterality: N/A;   PROCTOSCOPY  07/23/2012   Procedure: PROCTOSCOPY;  Surgeon: Elspeth KYM Schultze, MD;  Location: [facility];  Service: General;  Laterality: N/A;  Rigid Proctoscopy   RIGHT/LEFT HEART CATH AND CORONARY ANGIOGRAPHY N/A 06/03/2022   Procedure: RIGHT/LEFT HEART CATH AND CORONARY ANGIOGRAPHY;  Surgeon: Mady Bruckner, MD;  Location: [facility] INVASIVE CV LAB;  Service: Cardiovascular;  Laterality: N/A;   ROTATOR CUFF REPAIR     TONSILLECTOMY     TOTAL HIP ARTHROPLASTY Right 05/27/2018   Procedure: RIGHT TOTAL HIP ARTHROPLASTY ANTERIOR APPROACH;  Surgeon: Melodi Lerner, MD;  Location: [facility];  Service: Orthopedics;  Laterality: Right;    UMBILICAL HERNIA REPAIR  07/23/2012   Procedure: HERNIA REPAIR UMBILICAL ADULT;  Surgeon: Elspeth KYM Schultze, MD;  Location: [facility];  Service: General;  Laterality: N/A;  Primary Umbilical Hernia Repair   UPPER GASTROINTESTINAL ENDOSCOPY      Allergies  Allergies  Allergen Reactions   Penicillins Rash and Hives    Over 60 years ago Has patient had a PCN reaction causing immediate rash, facial/tongue/throat swelling, SOB or lightheadedness with hypotension: Unknown Has patient had a PCN reaction causing severe rash involving mucus membranes or skin necrosis: Unknown Has patient had a PCN reaction that required hospitalization: No Has patient had a PCN reaction occurring within the last 10 years: No If all of the above answers are NO, then may proceed with Cephalosporin use.     Home Medications    Prior to Admission medications   Medication Sig Start Date End Date Taking? Authorizing Provider  acetaminophen  (TYLENOL ) 500 MG tablet Take 1,000 mg by mouth every 6 (six) hours as needed for moderate pain.    [provider]  albuterol  (VENTOLIN  HFA) 108 (90 Base) MCG/ACT  inhaler Inhale 2 puffs into the lungs every 6 (six) hours as needed for wheezing or shortness of breath. 04/06/19   Zollie Lowers, MD  apixaban (ELIQUIS) 5 MG TABS tablet Take 5 mg by mouth 2 (two) times daily. 10/08/23   [provider]  BREYNA  160-4.5 MCG/ACT inhaler Inhale into the lungs. 05/03/23   [provider]  calcium  carbonate (OS-CAL) 1250 (500 Ca) MG chewable tablet Chew 2 tablets by mouth daily. 05/27/08   [provider]  Cholecalciferol  (VITAMIN D ) 50 MCG (2000 UT) tablet Take 2,000 Units by mouth daily.    [provider]  clindamycin  (CLEOCIN ) 300 MG capsule Take 300 mg by mouth as needed. Take 2 capsules by mouth 1 hour prior to dental treatment    [provider]  diclofenac  Sodium (VOLTAREN ) 1 % GEL Apply 2 g topically 4 (four) times daily  as needed (shoulders/knees/back pain).    [provider]  escitalopram  (LEXAPRO ) 10 MG tablet Take 1 tablet (10 mg total) by mouth at bedtime. 02/16/21   Zollie Lowers, MD  famotidine (PEPCID) 10 MG tablet Take 10 mg by mouth 2 (two) times daily as needed for heartburn or indigestion.    [provider]  fluticasone  (FLONASE ) 50 MCG/ACT nasal spray USE 2 SPRAYS IN EACH       NOSTRIL DAILY Patient taking differently: Place 2 sprays into both nostrils at bedtime. 08/25/21   Zollie Lowers, MD  fluticasone -salmeterol (WIXELA INHUB) 250-50 MCG/ACT AEPB Inhale 1 puff into the lungs in the morning and at bedtime. Patient not taking: Reported on 10/24/2023    [provider]  glucose blood (ONETOUCH VERIO) test strip USE TO CHECK BLOOD SUGAR TWICE DAILY AND AS NEEDED 02/16/21   Zollie Lowers, MD  hydroxypropyl methylcellulose / hypromellose (ISOPTO TEARS / GONIOVISC) 2.5 % ophthalmic solution Place 1 drop into both eyes 4 (four) times daily as needed for dry eyes.    [provider]  Anselm Oil 350 MG CAPS Take 350 mg by mouth daily.    [provider]  lidocaine   (LIDODERM ) 5 % Place 1 patch onto the skin daily. 10/08/23   [provider]  metFORMIN  (GLUCOPHAGE ) 500 MG tablet Take 1 tablet (500 mg total) by mouth daily with breakfast. 05/21/21   Zollie Lowers, MD  montelukast  (SINGULAIR ) 10 MG tablet Take 1 tablet (10 mg total) by mouth at bedtime. Needs office visit for further refills 10/29/21   Zollie Lowers, MD  Multiple Vitamins-Minerals (CENTRUM SILVER PO) Take 1 tablet by mouth daily. Patient not taking: Reported on 10/24/2023    [provider]  nitroGLYCERIN  (NITROSTAT ) 0.4 MG SL tablet DISSOLVE ONE TABLET UNDER THE TONGUE EVERY 5 MINUTES AS NEEDED FOR CHEST PAIN.  DO NOT EXCEED A TOTAL OF 3 DOSES IN 15 MINUTES Patient not taking: Reported on 09/29/2023 05/16/22   Lavona Agent, MD  PRESCRIPTION MEDICATION Inhale 1 application into the lungs at bedtime. CPAP MACHINE    [provider]  rosuvastatin  (CRESTOR ) 10 MG tablet Take 1 tablet (10 mg total) by mouth daily. 11/05/21   Zollie Lowers, MD  sucralfate  (CARAFATE ) 1 g tablet Take 1 tablet (1 g total) by mouth daily. Needed for stomach coating.  Needs office visit for further refills Patient taking differently: Take 1 g by mouth daily as needed (Needed for stomach coating.). Needs office visit for further refills 10/29/21   Zollie Lowers, MD  tamsulosin  (FLOMAX ) 0.4 MG CAPS capsule Take 2 capsules (0.8 mg total) by mouth daily after supper. Patient taking differently: Take 0.4 mg by mouth daily after supper. 05/21/21   Zollie Lowers, MD  traMADol (ULTRAM) 50 MG tablet Take 50 mg by mouth every 6 (six) hours as needed.    [provider]  traZODone (DESYREL) 100 MG tablet Take 50-100 mg by mouth at bedtime.    [provider]  zolpidem  (AMBIEN ) 10 MG tablet TAKE ONE TABLET AT BEDTIME AS NEEDED FOR SLEEP Patient taking differently: Take 10 mg by mouth at bedtime. 11/15/21   Zollie Lowers, MD    Physical Exam    Vital Signs:  Carlin KATHEE Gunning does not  have vital signs available for review today.  Given telephonic nature of communication, physical exam is limited. AAOx3. NAD. Normal affect.  Speech and respirations are unlabored.  Accessory Clinical Findings    None  Assessment & Plan  1.  Preoperative Cardiovascular Risk Assessment: According to the Revised Cardiac Risk Index (RCRI), his Perioperative Risk of Major Cardiac Event is (%): 0.9. His Functional Capacity in METs is: 5.62 according to the [facility] Activity Status Index (DASI). Therefore, based on ACC/AHA guidelines, patient would be at acceptable risk for the planned procedure without further cardiovascular testing.  The patient was advised that if he develops new symptoms prior to surgery to contact our office to arrange for a follow-up visit, and he verbalized understanding.   Per office protocol, patient can hold Eliquis for 3 days prior to procedure.     Patient will not need bridging with Lovenox (enoxaparin) around procedure.  A copy of this note will be routed to requesting surgeon.  Time:  Today, I have spent 10 minutes with the patient with telehealth technology discussing medical history, symptoms, and management plan.     Lum LITTIE Louis, NP  02/18/2024, 2:33 PM

## 2024-02-18 NOTE — Telephone Encounter (Signed)
   Name: Cristian Moore  DOB: 1941-05-25  MRN: [id number]  Primary Cardiologist: Lynwood Schilling, MD   Preoperative team, please contact this patient and set up a phone call appointment for further preoperative risk assessment. Please obtain consent and complete medication review. Thank you for your help.  I confirm that guidance regarding antiplatelet and oral anticoagulation therapy has been completed and, if necessary, noted below.  Per office protocol, patient can hold Eliquis for 3 days prior to procedure.     Patient will not need bridging with Lovenox (enoxaparin) around procedure.  I also confirmed the patient resides in the state of [place] . As per [facility] Medical Board telemedicine laws, the patient must reside in the state in which the provider is licensed.   Lamarr Satterfield, NP 02/18/2024, 10:08 AM [facility]

## 2024-02-18 NOTE — Telephone Encounter (Signed)
 Patient with history of PE, > 6 months ago but unknown date. Found at [facility].   CrCl 76 ml/min Platelet count 281K   Per office protocol, patient can hold Eliquis for 3 days prior to procedure.    Patient will not need bridging with Lovenox (enoxaparin) around procedure.  **This guidance is not considered finalized until pre-operative APP has relayed final recommendations.**

## 2024-02-20 ENCOUNTER — Ambulatory Visit: Payer: Self-pay | Admitting: Cardiology

## 2024-06-02 ENCOUNTER — Telehealth: Payer: Self-pay | Admitting: Cardiology

## 2024-06-02 NOTE — Telephone Encounter (Signed)
 Pt c/o Shortness Of Breath: STAT if SOB developed within the last 24 hours or pt is noticeably SOB on the phone  1. Are you currently SOB (can you hear that pt is SOB on the phone)? no  2. How long have you been experiencing SOB? 3-4 weeks. He emphasized that when it started, he had a lot of phlegm.   3. Are you SOB when sitting or when up moving around? Moving around   4. Are you currently experiencing any other symptoms? Nothing currently; prior to him going to the ER, he had chest and back pain   He states the [facility] and the ER at [facility] suggested he have a stress test

## 2024-06-02 NOTE — Telephone Encounter (Signed)
 Spoke with patient of Dr. Lavona. He was seen by [facility] PCP on 12/1. He had been a little SOB + phlegm + CP[facility] ED. No new PE. Patient said EKG was fine. Was advised to stay overnight and have stress test but he declined this.   Routed to MD to review -- Order stress test? Schedule f/u first?

## 2024-06-03 NOTE — Telephone Encounter (Signed)
 Spoke with pt. Pt stated [facility] called [facility] and set up a stress test for 1/20.They also sent him to [facility] for testing for a PE. The test was negative and a stress test was ordered. Pt wants to wait until he and his wife are due in March for their appt with Dr. Lavona in [place]. Pt was given ED precautions. PT verbalized understanding. All questions if any were answered.
